# Patient Record
Sex: Male | Born: 1965 | Marital: Married | Smoking: Former smoker
Health system: Southern US, Community
[De-identification: ages and names within clinical notes are randomized; demographics above are authoritative.]

## PROBLEM LIST (undated history)

## (undated) DIAGNOSIS — I480 Paroxysmal atrial fibrillation: Secondary | ICD-10-CM

## (undated) DIAGNOSIS — G4733 Obstructive sleep apnea (adult) (pediatric): Secondary | ICD-10-CM

## (undated) DIAGNOSIS — R253 Fasciculation: Secondary | ICD-10-CM

## (undated) DIAGNOSIS — I10 Essential (primary) hypertension: Secondary | ICD-10-CM

## (undated) DIAGNOSIS — Z9989 Dependence on other enabling machines and devices: Secondary | ICD-10-CM

## (undated) DIAGNOSIS — R9439 Abnormal result of other cardiovascular function study: Secondary | ICD-10-CM

## (undated) DIAGNOSIS — H269 Unspecified cataract: Secondary | ICD-10-CM

## (undated) DIAGNOSIS — G8929 Other chronic pain: Secondary | ICD-10-CM

## (undated) DIAGNOSIS — Z9289 Personal history of other medical treatment: Secondary | ICD-10-CM

## (undated) DIAGNOSIS — K219 Gastro-esophageal reflux disease without esophagitis: Secondary | ICD-10-CM

## (undated) DIAGNOSIS — T7840XA Allergy, unspecified, initial encounter: Secondary | ICD-10-CM

## (undated) DIAGNOSIS — M109 Gout, unspecified: Secondary | ICD-10-CM

## (undated) DIAGNOSIS — M545 Low back pain, unspecified: Secondary | ICD-10-CM

## (undated) DIAGNOSIS — R001 Bradycardia, unspecified: Secondary | ICD-10-CM

## (undated) DIAGNOSIS — M1990 Unspecified osteoarthritis, unspecified site: Secondary | ICD-10-CM

## (undated) DIAGNOSIS — G43909 Migraine, unspecified, not intractable, without status migrainosus: Secondary | ICD-10-CM

## (undated) DIAGNOSIS — R200 Anesthesia of skin: Secondary | ICD-10-CM

## (undated) HISTORY — DX: Morbid (severe) obesity due to excess calories: E66.01

## (undated) HISTORY — DX: Unspecified osteoarthritis, unspecified site: M19.90

## (undated) HISTORY — DX: Paroxysmal atrial fibrillation: I48.0

## (undated) HISTORY — DX: Personal history of other medical treatment: Z92.89

## (undated) HISTORY — DX: Allergy, unspecified, initial encounter: T78.40XA

## (undated) HISTORY — DX: Unspecified cataract: H26.9

---

## 1998-02-14 ENCOUNTER — Encounter: Admission: RE | Admit: 1998-02-14 | Discharge: 1998-02-14 | Payer: Self-pay | Admitting: Family Medicine

## 1998-06-14 ENCOUNTER — Emergency Department (HOSPITAL_COMMUNITY): Admission: EM | Admit: 1998-06-14 | Discharge: 1998-06-14 | Payer: Self-pay | Admitting: Emergency Medicine

## 1998-09-19 ENCOUNTER — Encounter: Admission: RE | Admit: 1998-09-19 | Discharge: 1998-09-19 | Payer: Self-pay | Admitting: Sports Medicine

## 1999-07-11 ENCOUNTER — Encounter: Payer: Self-pay | Admitting: Emergency Medicine

## 1999-07-11 ENCOUNTER — Emergency Department (HOSPITAL_COMMUNITY): Admission: EM | Admit: 1999-07-11 | Discharge: 1999-07-11 | Payer: Self-pay | Admitting: Emergency Medicine

## 2000-08-25 ENCOUNTER — Emergency Department (HOSPITAL_COMMUNITY): Admission: EM | Admit: 2000-08-25 | Discharge: 2000-08-25 | Payer: Self-pay

## 2001-12-02 ENCOUNTER — Encounter: Admission: RE | Admit: 2001-12-02 | Discharge: 2001-12-02 | Payer: Self-pay | Admitting: Family Medicine

## 2002-03-12 ENCOUNTER — Encounter: Payer: Self-pay | Admitting: Cardiology

## 2002-03-12 ENCOUNTER — Encounter: Payer: Self-pay | Admitting: Emergency Medicine

## 2002-03-12 ENCOUNTER — Inpatient Hospital Stay (HOSPITAL_COMMUNITY): Admission: EM | Admit: 2002-03-12 | Discharge: 2002-03-15 | Payer: Self-pay | Admitting: Emergency Medicine

## 2002-03-23 ENCOUNTER — Encounter: Admission: RE | Admit: 2002-03-23 | Discharge: 2002-03-23 | Payer: Self-pay | Admitting: Internal Medicine

## 2002-04-12 ENCOUNTER — Emergency Department (HOSPITAL_COMMUNITY): Admission: EM | Admit: 2002-04-12 | Discharge: 2002-04-12 | Payer: Self-pay | Admitting: *Deleted

## 2002-04-21 ENCOUNTER — Ambulatory Visit (HOSPITAL_BASED_OUTPATIENT_CLINIC_OR_DEPARTMENT_OTHER): Admission: RE | Admit: 2002-04-21 | Discharge: 2002-04-21 | Payer: Self-pay | Admitting: Internal Medicine

## 2002-07-16 ENCOUNTER — Emergency Department (HOSPITAL_COMMUNITY): Admission: EM | Admit: 2002-07-16 | Discharge: 2002-07-16 | Payer: Self-pay | Admitting: Emergency Medicine

## 2003-03-23 ENCOUNTER — Emergency Department (HOSPITAL_COMMUNITY): Admission: EM | Admit: 2003-03-23 | Discharge: 2003-03-23 | Payer: Self-pay | Admitting: Emergency Medicine

## 2003-03-23 ENCOUNTER — Encounter: Payer: Self-pay | Admitting: Emergency Medicine

## 2004-03-20 ENCOUNTER — Emergency Department (HOSPITAL_COMMUNITY): Admission: EM | Admit: 2004-03-20 | Discharge: 2004-03-20 | Payer: Self-pay | Admitting: Emergency Medicine

## 2005-05-05 HISTORY — PX: CARDIAC CATHETERIZATION: SHX172

## 2005-05-17 ENCOUNTER — Observation Stay (HOSPITAL_COMMUNITY): Admission: EM | Admit: 2005-05-17 | Discharge: 2005-05-18 | Payer: Self-pay | Admitting: Emergency Medicine

## 2005-05-17 ENCOUNTER — Ambulatory Visit: Payer: Self-pay | Admitting: Sports Medicine

## 2005-05-17 ENCOUNTER — Ambulatory Visit: Payer: Self-pay | Admitting: Internal Medicine

## 2005-05-17 ENCOUNTER — Encounter: Payer: Self-pay | Admitting: Internal Medicine

## 2005-05-29 ENCOUNTER — Inpatient Hospital Stay (HOSPITAL_COMMUNITY): Admission: EM | Admit: 2005-05-29 | Discharge: 2005-05-31 | Payer: Self-pay | Admitting: Emergency Medicine

## 2005-05-29 ENCOUNTER — Ambulatory Visit: Payer: Self-pay | Admitting: Family Medicine

## 2005-06-03 ENCOUNTER — Ambulatory Visit: Payer: Self-pay | Admitting: Sports Medicine

## 2005-06-18 ENCOUNTER — Ambulatory Visit: Payer: Self-pay | Admitting: Family Medicine

## 2005-07-15 ENCOUNTER — Ambulatory Visit: Payer: Self-pay | Admitting: Family Medicine

## 2006-01-07 ENCOUNTER — Ambulatory Visit: Payer: Self-pay | Admitting: Family Medicine

## 2006-01-20 ENCOUNTER — Ambulatory Visit: Payer: Self-pay | Admitting: Family Medicine

## 2006-02-24 ENCOUNTER — Ambulatory Visit: Payer: Self-pay | Admitting: Family Medicine

## 2006-07-02 ENCOUNTER — Ambulatory Visit: Payer: Self-pay | Admitting: Family Medicine

## 2006-07-31 ENCOUNTER — Ambulatory Visit: Payer: Self-pay | Admitting: Family Medicine

## 2006-08-07 ENCOUNTER — Encounter: Admission: RE | Admit: 2006-08-07 | Discharge: 2006-08-07 | Payer: Self-pay | Admitting: Family Medicine

## 2006-10-02 DIAGNOSIS — G43909 Migraine, unspecified, not intractable, without status migrainosus: Secondary | ICD-10-CM | POA: Insufficient documentation

## 2006-10-02 DIAGNOSIS — G4730 Sleep apnea, unspecified: Secondary | ICD-10-CM | POA: Insufficient documentation

## 2006-10-02 DIAGNOSIS — K219 Gastro-esophageal reflux disease without esophagitis: Secondary | ICD-10-CM

## 2006-10-02 DIAGNOSIS — I10 Essential (primary) hypertension: Secondary | ICD-10-CM | POA: Insufficient documentation

## 2006-10-02 DIAGNOSIS — E785 Hyperlipidemia, unspecified: Secondary | ICD-10-CM | POA: Insufficient documentation

## 2006-10-02 HISTORY — DX: Hyperlipidemia, unspecified: E78.5

## 2006-10-02 HISTORY — DX: Essential (primary) hypertension: I10

## 2006-10-02 HISTORY — DX: Morbid (severe) obesity due to excess calories: E66.01

## 2006-10-02 HISTORY — DX: Migraine, unspecified, not intractable, without status migrainosus: G43.909

## 2006-10-02 HISTORY — DX: Gastro-esophageal reflux disease without esophagitis: K21.9

## 2007-05-13 ENCOUNTER — Telehealth: Payer: Self-pay | Admitting: *Deleted

## 2007-05-13 ENCOUNTER — Ambulatory Visit: Payer: Self-pay | Admitting: Family Medicine

## 2007-05-13 DIAGNOSIS — M109 Gout, unspecified: Secondary | ICD-10-CM

## 2007-05-13 HISTORY — DX: Gout, unspecified: M10.9

## 2007-07-20 ENCOUNTER — Telehealth: Payer: Self-pay | Admitting: Family Medicine

## 2007-08-31 ENCOUNTER — Telehealth: Payer: Self-pay | Admitting: *Deleted

## 2007-09-01 ENCOUNTER — Encounter: Payer: Self-pay | Admitting: Family Medicine

## 2007-09-07 ENCOUNTER — Encounter: Payer: Self-pay | Admitting: Family Medicine

## 2007-12-02 ENCOUNTER — Encounter: Admission: RE | Admit: 2007-12-02 | Discharge: 2007-12-02 | Payer: Self-pay | Admitting: Sports Medicine

## 2007-12-02 ENCOUNTER — Ambulatory Visit: Payer: Self-pay | Admitting: Family Medicine

## 2007-12-02 DIAGNOSIS — M545 Low back pain: Secondary | ICD-10-CM

## 2008-02-23 ENCOUNTER — Ambulatory Visit: Payer: Self-pay | Admitting: Family Medicine

## 2008-02-23 ENCOUNTER — Encounter: Admission: RE | Admit: 2008-02-23 | Discharge: 2008-02-23 | Payer: Self-pay | Admitting: Family Medicine

## 2008-02-23 ENCOUNTER — Ambulatory Visit (HOSPITAL_COMMUNITY): Admission: RE | Admit: 2008-02-23 | Discharge: 2008-02-23 | Payer: Self-pay | Admitting: Family Medicine

## 2008-02-23 ENCOUNTER — Encounter: Payer: Self-pay | Admitting: Family Medicine

## 2008-02-25 ENCOUNTER — Encounter: Payer: Self-pay | Admitting: Family Medicine

## 2008-02-28 ENCOUNTER — Emergency Department (HOSPITAL_COMMUNITY): Admission: EM | Admit: 2008-02-28 | Discharge: 2008-02-28 | Payer: Self-pay | Admitting: Emergency Medicine

## 2008-03-04 ENCOUNTER — Ambulatory Visit: Payer: Self-pay | Admitting: Family Medicine

## 2008-03-11 ENCOUNTER — Telehealth: Payer: Self-pay | Admitting: Family Medicine

## 2008-04-25 ENCOUNTER — Encounter: Payer: Self-pay | Admitting: *Deleted

## 2008-07-08 ENCOUNTER — Encounter: Payer: Self-pay | Admitting: Family Medicine

## 2008-09-06 ENCOUNTER — Telehealth: Payer: Self-pay | Admitting: Family Medicine

## 2008-09-06 ENCOUNTER — Ambulatory Visit (HOSPITAL_COMMUNITY): Admission: RE | Admit: 2008-09-06 | Discharge: 2008-09-06 | Payer: Self-pay | Admitting: Family Medicine

## 2008-09-06 ENCOUNTER — Ambulatory Visit: Payer: Self-pay | Admitting: Family Medicine

## 2008-09-09 ENCOUNTER — Encounter: Payer: Self-pay | Admitting: Family Medicine

## 2008-09-19 ENCOUNTER — Ambulatory Visit: Payer: Self-pay | Admitting: Family Medicine

## 2008-09-19 ENCOUNTER — Encounter: Payer: Self-pay | Admitting: Family Medicine

## 2008-09-19 DIAGNOSIS — G4700 Insomnia, unspecified: Secondary | ICD-10-CM | POA: Insufficient documentation

## 2008-09-19 HISTORY — DX: Insomnia, unspecified: G47.00

## 2008-09-19 LAB — CONVERTED CEMR LAB
AST: 32 units/L (ref 0–37)
Albumin: 4.5 g/dL (ref 3.5–5.2)
Alkaline Phosphatase: 74 units/L (ref 39–117)
BUN: 17 mg/dL (ref 6–23)
Chloride: 107 meq/L (ref 96–112)
Creatinine, Ser: 0.95 mg/dL (ref 0.40–1.50)
Glucose, Bld: 103 mg/dL — ABNORMAL HIGH (ref 70–99)
HDL: 33 mg/dL — ABNORMAL LOW (ref >39)
Total CHOL/HDL Ratio: 5.2
Total Protein: 7.4 g/dL (ref 6.0–8.3)
Triglycerides: 286 mg/dL — ABNORMAL HIGH (ref <150)

## 2008-09-20 ENCOUNTER — Encounter: Payer: Self-pay | Admitting: Family Medicine

## 2009-02-23 ENCOUNTER — Ambulatory Visit: Payer: Self-pay | Admitting: Family Medicine

## 2009-02-23 ENCOUNTER — Telehealth: Payer: Self-pay | Admitting: Family Medicine

## 2009-06-02 ENCOUNTER — Ambulatory Visit: Payer: Self-pay | Admitting: Family Medicine

## 2009-06-02 DIAGNOSIS — M654 Radial styloid tenosynovitis [de Quervain]: Secondary | ICD-10-CM

## 2009-06-12 ENCOUNTER — Ambulatory Visit: Payer: Self-pay | Admitting: Family Medicine

## 2009-09-18 ENCOUNTER — Telehealth: Payer: Self-pay | Admitting: Family Medicine

## 2010-04-04 ENCOUNTER — Ambulatory Visit: Payer: Self-pay | Admitting: Family Medicine

## 2010-05-07 ENCOUNTER — Ambulatory Visit: Payer: Self-pay | Admitting: Family Medicine

## 2010-05-07 ENCOUNTER — Encounter: Payer: Self-pay | Admitting: Family Medicine

## 2010-05-07 LAB — CONVERTED CEMR LAB
ALT: 45 units/L (ref 0–53)
Chloride: 105 meq/L (ref 96–112)
Glucose, Bld: 100 mg/dL — ABNORMAL HIGH (ref 70–99)
Potassium: 4.2 meq/L (ref 3.5–5.3)
Sodium: 141 meq/L (ref 135–145)
Total Protein: 7.1 g/dL (ref 6.0–8.3)
Triglycerides: 126 mg/dL (ref <150)
Uric Acid, Serum: 5.3 mg/dL (ref 4.0–7.8)

## 2010-05-08 ENCOUNTER — Encounter: Payer: Self-pay | Admitting: Family Medicine

## 2010-06-05 ENCOUNTER — Encounter: Payer: Self-pay | Admitting: Family Medicine

## 2010-06-14 ENCOUNTER — Telehealth: Payer: Self-pay | Admitting: Family Medicine

## 2010-08-24 ENCOUNTER — Ambulatory Visit: Admission: RE | Admit: 2010-08-24 | Discharge: 2010-08-24 | Payer: Self-pay | Source: Home / Self Care

## 2010-08-24 ENCOUNTER — Encounter: Payer: Self-pay | Admitting: Family Medicine

## 2010-08-24 NOTE — Progress Notes (Deleted)
Subjective:     Patient ID: [name] is a 45 y.o. male.  HPIPatient did not pick up the refill for his allopurinol for 2 weeks and today noticed his right great toe beginning to develop pain of gout.  He was not able to wear his shoes today.    Right shoulder is still causing problems but only when he lifts heavy weights.  Back Pain has been under good control with weight loss and exercise.    Review of Systems  Musculoskeletal: Positive for back pain and joint swelling.       Objective:   Physical Exam Subjective:     [name] is a 45 y.o. male who presents with possible gout. Pain is located in {pain location list:14040}, and has been present for {1-10:13787} {time; units:19136}. Pain is described as {pain character:10965}, and is {pain assessment:11180}. Associated symptoms: {assoc symptoms:11272}. The patient has {pain relief:19418}. Related to injury: {yes***/no:311194}.  {Common ambulatory SmartLinks:19316}  Review of Systems {ros; complete:30496}    Objective:    {exam; complete:17964}     Assessment:    {arthritis dx list:14046::"Gouty flare up"}     Plan:    {Plan; [phone]}     Assessment:     ***    Plan:     ***

## 2010-09-04 NOTE — Letter (Signed)
Summary: Lipid Letter  [facility]  [address]   [place], [place] [address]   Phone: [phone]  Fax: [phone]    05/08/2010  [name] [address] [place], [place]  [address]  Dear [name]:  We have carefully reviewed your last lipid profile from 05/07/2010 and the results are noted below with a summary of recommendations for lipid management.    Cholesterol:       171     Goal: less than 200   HDL "good" Cholesterol:   28     Goal: greater than 50   LDL "bad" Cholesterol:   118     Goal: less than 130   Triglycerides:       126     Goal: less than 120    As you see you have a low HDL, HDL is the good cholesterol.  This is probably genetic but if you exercise daily you can increase this and keep your heart healthy.    TLC Diet (Therapeutic Lifestyle Change): Saturated Fats (animal fats) & Transfatty acids (found in comercial baking products)should be kept < 7% of total calories ***Reduce Saturated Fats, just do not eat them.  Total Fat should be no greater than 25-35% of total calories Carbohydrates should be 30-40% of total calories, because of risk of diabetes(these are sugars, breads, chips, pasta) Protein should be approximately 15% of total calories Increased fiber may help lower LDL      Adjunctive Measures (may lower LIPIDS and reduce risk of Heart Attack) include: Aerobic Exercise (20-30 minutes 3-4 times a week)-is the most important activity for you.  You HDL is very low and that can only be increased wtih regular exercise. Limit Alcohol Consumption Weight Reduction-is essential Aspirin 81 mg a day by mouth (if not allergic or contraindicated)- Dietary Fiber 20-30 grams a day by mouth     Current Medications: 1)    Allopurinol 300 Mg Tabs (Allopurinol) .Marland Kitchen.. 1 tablet by mouth once a day 2)    Omeprazole 20 Mg Cpdr (Omeprazole) .... Take 1 capsule by mouth twice a day 3)    Toprol Xl 50 Mg Xr24h-tab (Metoprolol succinate) .... One  daily 4)    Fish Oil 1000 Mg Caps (Omega-3 fatty acids) 5)    Aspirin 81 Mg Chew (Aspirin) .Marland Kitchen.. 1 by mouth once daily 6)    Sumatriptan Succinate 50 Mg Tabs (Sumatriptan succinate) .... Sig: take 1 tablet with onset ha; you may repeat in 2 hours if the headache persists. disp quant sufficient 1 month  If you have any questions, please call. We appreciate being able to work with you.   Sincerely,    [facility] Luretha Murphy NP  Appended Document: Lipid Letter mailed

## 2010-09-04 NOTE — Assessment & Plan Note (Signed)
Summary: [name]   Vital Signs:  Patient profile:   45 year old male Height:      74 inches Weight:      318 pounds BMI:     40.98 Pulse rate:   60 / minute BP sitting:   147 / 90  (left arm) Cuff size:   large  Vitals Entered By: Renato Battles slade,cma CC: physical. flu shot. Is Patient Diabetic? No Pain Assessment Patient in pain? no        Primary Care Provider:  Luretha Murphy NP  CC:  physical. flu shot..  History of Present Illness: Annual CPE, having aches and pain but overall nothing acute.  No bouts of gout since on allopurinol.  Episode of migrane HA resolved, see by D. Breen.    He and wife are trying to loose weight by doing Weight Watchers, he tries to go to the gym 3 times per week.  Back hurts almost all the time, gets worse with rain.  Chest wall pain one week ago that has resolved, it was contiuous and felt as it he pulled a muscle.  BP has been running low and he has been feeling lightheaded, has not consistently be taking his Toprol.  Habits & Providers  Alcohol-Tobacco-Diet     Tobacco Status: quit > 6 months     Tobacco Counseling: to remain off tobacco products  Current Medications (verified): 1)  Allopurinol 300 Mg Tabs (Allopurinol) .Marland Kitchen.. 1 Tablet By Mouth Once A Day 2)  Omeprazole 20 Mg Cpdr (Omeprazole) .... Take 1 Capsule By Mouth Twice A Day 3)  Toprol Xl 50 Mg Xr24h-Tab (Metoprolol Succinate) .... One Daily 4)  Fish Oil 1000 Mg Caps (Omega-3 Fatty Acids) 5)  Aspirin 81 Mg Chew (Aspirin) .Marland Kitchen.. 1 By Mouth Once Daily 6)  Sumatriptan Succinate 50 Mg Tabs (Sumatriptan Succinate) .... Sig: Take 1 Tablet With Onset Ha; You May Repeat in 2 Hours If The Headache Persists. Disp Quant Sufficient 1 Month  Allergies: 1)  ! Penicillin V Potassium (Penicillin V Potassium)  Past History:  Past Medical History: Last updated: 09/19/2008 admitted for cp 10/06, rulled out,  TSH nl BIPAP for sleep apnea  Past Surgical History: Last updated: 05/13/2007 ABI >1  - 07/01/2005,  Cardiac Cath-no CAD - 06/18/2005,  CPAP - 07/31/2006, Ddimer- neg - 06/18/2005,  Lipid panel: TC=160, TG=310, HDL=32, LDL=66 - 08/01/2006,  Multiple skin tag removal - 07/31/2006, Sleep Study 2007-sleep apnea - 07/03/2006,  Sleep study-2003 - 06/18/2005,  venous dopplers-neg for DVT - 06/18/2005,  Xray L foot minimal DJD at [facility] - 08/14/2006  Family History: Last updated: 02/23/2008 DM HTN Mom? "heart problems" at 30 or 40 but has not had MI that he is aware of. Father and brother no heart problems, no stroke.  Social History: Last updated: 09/06/2008 [place] [place], works on Information systems manager heavy items.   Review of Systems      See HPI  Physical Exam  General:  Well appearing overweight male. Ears:  R ear normal and L ear normal.   Nose:  External nasal examination shows no deformity or inflammation. Nasal mucosa are pink and moist without lesions or exudates. Mouth:  poor dentition.  small phaynx Neck:  short neck, no masses Chest Wall:  non tender Lungs:  normal respiratory effort and normal breath sounds.   Heart:  normal rate, regular rhythm, and no murmur.   Abdomen:  obese, firm, non tender Msk:  descreased ROM of lower back, flat lumbar lordosis  Skin:  Intact without suspicious lesions or rashes Psych:  Oriented X3 and good eye contact.     Impression & Recommendations:  Problem # 1:  HEALTH MAINTENANCE EXAM (ICD-V70.0) Reinforced healthy diet and regular exercise as the only road to staying healthy Orders: [facility] - Est  40-64 yrs (36644)  Problem # 2:  BACK PAIN, LUMBAR (ICD-724.2) Recommended doing low back exercises daily, has not requied on going pain meds, just during flairs. His updated medication list for this problem includes:    Aspirin 81 Mg Chew (Aspirin) .Marland Kitchen... 1 by mouth once daily  Problem # 3:  GOUT (ICD-274.9) no recent attacks, check UA today His updated medication list for this problem includes:    Allopurinol  300 Mg Tabs (Allopurinol) .Marland Kitchen... 1 tablet by mouth once a day  Orders: Uric Acid-[facility] ([id number])  Problem # 4:  OBESITY, NOS (ICD-278.00) has lost a few pounds recenttly, pattern has been to loose and gain, eats too much food for the most part.  Problem # 5:  HYPERTENSION, BENIGN SYSTEMIC (ICD-401.1) reduce dosage of beta blocker His updated medication list for this problem includes:    Toprol Xl 50 Mg Xr24h-tab (Metoprolol succinate) ..... One daily  Orders: Lipid-[facility] ([id number])  Complete Medication List: 1)  Allopurinol 300 Mg Tabs (Allopurinol) .Marland Kitchen.. 1 tablet by mouth once a day 2)  Omeprazole 20 Mg Cpdr (Omeprazole) .... Take 1 capsule by mouth twice a day 3)  Toprol Xl 50 Mg Xr24h-tab (Metoprolol succinate) .... One daily 4)  Fish Oil 1000 Mg Caps (Omega-3 fatty acids) 5)  Aspirin 81 Mg Chew (Aspirin) .Marland Kitchen.. 1 by mouth once daily 6)  Sumatriptan Succinate 50 Mg Tabs (Sumatriptan succinate) .... Sig: take 1 tablet with onset ha; you may repeat in 2 hours if the headache persists. disp quant sufficient 1 month  Other Orders: Influenza Vaccine NON MCR (51884) Comp Met-[facility] ([id number])  Patient Instructions: 1)  Please schedule a follow-up appointment in 3 months .  2)  Cut Toprol in 1/2 until gone and then new dose 3)  Keep up exercising and stretching Prescriptions: TOPROL XL 50 MG XR24H-TAB (METOPROLOL SUCCINATE) one daily Brand medically necessary #30 x 6   Entered and Authorized by:   Luretha Murphy NP   Signed by:   Luretha Murphy NP on 05/07/2010   Method used:   Print then Give to Patient   RxID:   [id number]    Influenza Vaccine    Vaccine Type: Fluvax Non-MCR    Site: right deltoid    Mfr: [email or web address]    Dose: 0.5 ml    Route: IM    Given by: Arlyss Repress CMA,    Exp. Date: 01/30/2011    Lot #: [id number]    VIS given: 02/27/10 version given May 07, 2010.  Flu Vaccine Consent Questions    Do you have a history of severe allergic reactions to  this vaccine? no    Any prior history of allergic reactions to egg and/or gelatin? no    Do you have a sensitivity to the preservative Thimersol? no    Do you have a past history of Guillan-Barre Syndrome? no    Do you currently have an acute febrile illness? no    Have you ever had a severe reaction to latex? no    Vaccine information given and explained to patient? yes

## 2010-09-04 NOTE — Progress Notes (Signed)
Summary: phn msg  Phone Note Call from Patient Call back at [phone]   Caller: Patient Summary of Call: strained a muscle in his chest and wants to know if he can take a muscle relaxer that he already has Initial call taken by: De Nurse,  June 14, 2010 10:03 AM  Follow-up for Phone Call        Pain in on the left side of his chest but he's positive that it is a pulled muscle.  He's lifting weights per Susan's recommendations and says he pulled it then.  No other signs of AMI, denies SOB, diaphoresis, radiating pains, etc.  It has happened before in the same location and is localized to the muscle.  He has an old script for Flexeril but it has expired.  Told him to discard those and I would ask Darl Pikes if she would be willing to refill.  Explained that she probably will want to see him first.  He understood.  Told him I would call him back later. Follow-up by: Dennison Nancy RN,  June 14, 2010 10:24 AM  Additional Follow-up for Phone Call Additional follow up Details #1::        Let message on voice mail that I refilled flexeril and sent to [facility] on [address].  Recommend home care of massage, heat, ice, and muscle relaxant.  He may come in if he wants to be seen. Additional Follow-up by: Luretha Murphy NP,  June 14, 2010 12:09 PM    New/Updated Medications: FLEXERIL 10 MG TABS (CYCLOBENZAPRINE HCL) one three times a day as needed Prescriptions: FLEXERIL 10 MG TABS (CYCLOBENZAPRINE HCL) one three times a day as needed  #30 x 1   Entered and Authorized by:   Luretha Murphy NP   Signed by:   Luretha Murphy NP on 06/14/2010   Method used:   Electronically to        [facility] (retail)       [address]       [place], [place]  [address]       Ph: [phone]       Fax: [phone]   RxID:   [id number]   Appended Document: phn msg Patient feels that he needs something stonger for pain, will call in Hydrocodone/APAP 5/500, #20 no refills. This  was called in and not handwritten. Luretha Murphy NP  June 14, 2010 12:26 PM    Clinical Lists Changes  Medications: Added new medication of HYDROCODONE-ACETAMINOPHEN 5-500 MG TABS (HYDROCODONE-ACETAMINOPHEN) one q 6 hours as needed pain - Signed Rx of HYDROCODONE-ACETAMINOPHEN 5-500 MG TABS (HYDROCODONE-ACETAMINOPHEN) one q 6 hours as needed pain;  #20 x 0;  Signed;  Entered by: Luretha Murphy NP;  Authorized by: Luretha Murphy NP;  Method used: Handwritten    Prescriptions: HYDROCODONE-ACETAMINOPHEN 5-500 MG TABS (HYDROCODONE-ACETAMINOPHEN) one q 6 hours as needed pain  #20 x 0   Entered and Authorized by:   Luretha Murphy NP   Signed by:   Luretha Murphy NP on 06/14/2010   Method used:   Handwritten   RxID:   [id number]

## 2010-09-04 NOTE — Assessment & Plan Note (Signed)
Summary: migraines/saxon/[facility]c   Vital Signs:  Patient profile:   45 year old male Weight:      329.6 pounds BMI:     42.47 Temp:     97.9 degrees F Pulse rate:   61 / minute BP sitting:   130 / 79  (left arm)  Vitals Entered By: Starleen Blue RN (April 04, 2010 2:06 PM) CC: migraine Is Patient Diabetic? No Pain Assessment Patient in pain? yes     Location: head Intensity: 6   Primary Care Provider:  Luretha Murphy NP  CC:  migraine.  History of Present Illness: Patient presents today for an acute visit. He is seen by me, along with medical student [facility].  [name] comes in today with complaint of headache waxing and waning over the past 3 weeks.  Has taken a total of three bottles of Excedrin, with only mild temporary relief.  Has had intermittent N/V when the pain is worse.  Pain is described as pounding, and is localized around/behind both eyes and forehead.  Occasionally radiates to just above pinnae.  Denies fevers or chills.  Perhaps mild photophobia.  He describes this HA pain as similar in character to the pain with his usual migraine headache pain that he has experienced since childhood.   Today he feels a bit better than he had.  Rates the pain a 6/10 today, compared with an 8-9 yesterday.  Says when really bad, the pain "brings tears to my eyes".  No jaw claudication, no rhinorrhea or unilateral lacrimation.  Social Hx; Quit tobacco.  Does not drink any alcohol.  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  Recently out of work for foot injury, during whcih time no headaches.  Went back to work at about the time of onset of this episode of headache.   Habits & Providers  Alcohol-Tobacco-Diet     Tobacco Status: quit > 6 months  Allergies (verified): 1)  ! Penicillin V Potassium (Penicillin V Potassium)  Social History: Smoking Status:  quit > 6 months  Physical Exam  General:   Generally well appearing, Jovial, in good spirits.  No apparent distress.  Eyes:  Non-dilated exam without evident papilledema Ears:  TMs intact and with good cone of light.  Nose:  No maxiillary or frontal sinus tenderness.  No purulent nasal discharge.  Mouth:  Moist mucus membranes, clear oropharynx. Neck:  Neck supple without cervical adenopathy. Lungs:  Normal respiratory effort, chest expands symmetrically. Lungs are clear to auscultation, no crackles or wheezes. Heart:  Normal rate and regular rhythm. S1 and S2 normal without gallop, murmur, click, rub or other extra sounds.   Impression & Recommendations:  Problem # 1:  MIGRAINE, UNSPEC., W/O INTRACTABLE MIGRAINE (ICD-346.90) Headache pain that is consistent with his prior migraines.  Had previously been on what sounds like a triptan (before 2008 EMR implementation) and this helped him but was cost-prohibitive.  He does not have diagnosis of heart disease; risk factors include HTN, obesity and hypertriglyceridemia.  Will opt to treat his acute HA complaint with Toradol 60mg  IM today.  May consider low-dose triptan if not resolved, and for future abortive therapy.    Will encourage him to speak with his primary about preventive/prophylaxis for migraines, as his come on two to three times a month and are pretty debilitating.  He may benefit from this, but should be addressed after the acute HA resolves.  The following medications were removed from the medication list:    Vicodin 5-500 Mg Tabs (Hydrocodone-acetaminophen) ..... One to two every 6 hours as needed pain.    Ibuprofen 200 Mg Tabs (Ibuprofen) .Marland Kitchen... 3-4 three times a day prn His updated medication list for this problem includes:    Toprol Xl 100 Mg Tb24 (Metoprolol succinate) .Marland Kitchen... Take 1 tablet by mouth once a day    Aspirin 81 Mg Chew (Aspirin) .Marland Kitchen... 1 by mouth once daily    Sumatriptan Succinate 50 Mg Tabs (Sumatriptan succinate) ..... Sig: take 1 tablet with onset ha; you may  repeat in 2 hours if the headache persists. disp quant sufficient 1 month  Orders: Ketorolac-Toradol 15mg  (Z6109) [facility]- Est Level  3 (60454)  Complete Medication List: 1)  Allopurinol 300 Mg Tabs (Allopurinol) .Marland Kitchen.. 1 tablet by mouth once a day 2)  Omeprazole 20 Mg Cpdr (Omeprazole) .... Take 1 capsule by mouth twice a day 3)  Toprol Xl 100 Mg Tb24 (Metoprolol succinate) .... Take 1 tablet by mouth once a day 4)  Fish Oil 1000 Mg Caps (Omega-3 fatty acids) 5)  Aspirin 81 Mg Chew (Aspirin) .Marland Kitchen.. 1 by mouth once daily 6)  Sumatriptan Succinate 50 Mg Tabs (Sumatriptan succinate) .... Sig: take 1 tablet with onset ha; you may repeat in 2 hours if the headache persists. disp quant sufficient 1 month  Patient Instructions: 1)  It was a pleasure to see you today.  We treated you for a persistent migraine headache.  2)  We are giving you an injection of Toradol 60mg  today. This should break your headache today.  3)  We recommend you talk with your primary care provider about using a daily/nightly medicine to prevent frequent headache flares.  4)  You are getting a prescription for sumatriptan (Imitrex) 50mg  tablets.  Please take one when the next migraine headache begins.  You may take another dose 2 hours later if the headache does not go away.  5)  Please make a follow-up appointment with your primary in the coming 1 month to discuss long-term migraine management.  Prescriptions: SUMATRIPTAN SUCCINATE 50 MG TABS (SUMATRIPTAN SUCCINATE) SIG: Take 1 tablet with onset HA; you may repeat in 2 hours if the headache persists. DISP Quant sufficient 1 month  #8 x 1   Entered and Authorized by:   Paula Compton MD   Signed by:   Paula Compton MD on 04/04/2010   Method used:   Electronically to        [facility] (retail)       [address]       [place], [place]  [address]       Ph: [phone]       Fax: [phone]   RxID:   [id number]    Medication  Administration  Injection # 1:    Medication: Ketorolac-Toradol 15mg     Diagnosis: MIGRAINE, UNSPEC., W/O INTRACTABLE MIGRAINE (ICD-346.90)    Route: IM    Site: LUOQ gluteus    Exp Date: 11/04/2011    Lot #: [id number]    Mfr: [email or web address]    Comments: 60mg  given    Patient tolerated injection without complications    Given by: Jone Baseman CMA (April 04, 2010 4:11 PM)  Orders Added: 1)  Ketorolac-Toradol 15mg  [J1885] 2)  [facility]- Est Level  3 [[address]403]

## 2010-09-04 NOTE — Progress Notes (Signed)
Summary: phn msg  Phone Note Call from Patient Call back at [phone]   Caller: Patient Summary of Call: Pt requesting something for his nerves.  His dad is getting ready to be taken off life support.  Can Luretha Murphy call him. Initial call taken by: Clydell Hakim,  September 18, 2009 4:07 PM    New/Updated Medications: LORAZEPAM 0.5 MG TABS (LORAZEPAM) one tab two times a day as needed for nervew Prescriptions: LORAZEPAM 0.5 MG TABS (LORAZEPAM) one tab two times a day as needed for nervew  #60 x 0   Entered and Authorized by:   Luretha Murphy NP   Signed by:   Luretha Murphy NP on 09/18/2009   Method used:   Printed then faxed to ...         RxID:   [id number]

## 2010-09-04 NOTE — Miscellaneous (Signed)
  Clinical Lists Changes  Problems: Removed problem of HEALTH MAINTENANCE EXAM (ICD-V70.0) Removed problem of SCREENING OTHER&UNSPEC CARDIOVASCULAR CONDITIONS (ICD-V81.2) Removed problem of SHOULDER PAIN, LEFT (ICD-719.41) Removed problem of ACHILLES TENDINITIS (ICD-726.71) Removed problem of CHEST PAIN (ICD-786.50) Removed problem of HAY FEVER (ICD-477.0)

## 2010-09-05 ENCOUNTER — Encounter: Payer: Self-pay | Admitting: Family Medicine

## 2010-09-05 ENCOUNTER — Inpatient Hospital Stay (HOSPITAL_COMMUNITY): Payer: 59

## 2010-09-05 ENCOUNTER — Ambulatory Visit (INDEPENDENT_AMBULATORY_CARE_PROVIDER_SITE_OTHER): Payer: 59 | Admitting: Family Medicine

## 2010-09-05 ENCOUNTER — Observation Stay (HOSPITAL_COMMUNITY)
Admission: AD | Admit: 2010-09-05 | Discharge: 2010-09-06 | Disposition: A | Discharge status: Home / Self Care | Payer: 59 | Source: Ambulatory Visit | Attending: Family Medicine | Admitting: Family Medicine

## 2010-09-05 DIAGNOSIS — K219 Gastro-esophageal reflux disease without esophagitis: Secondary | ICD-10-CM

## 2010-09-05 DIAGNOSIS — I498 Other specified cardiac arrhythmias: Secondary | ICD-10-CM | POA: Insufficient documentation

## 2010-09-05 DIAGNOSIS — G4733 Obstructive sleep apnea (adult) (pediatric): Secondary | ICD-10-CM | POA: Insufficient documentation

## 2010-09-05 DIAGNOSIS — I10 Essential (primary) hypertension: Secondary | ICD-10-CM

## 2010-09-05 DIAGNOSIS — M109 Gout, unspecified: Secondary | ICD-10-CM

## 2010-09-05 DIAGNOSIS — E785 Hyperlipidemia, unspecified: Secondary | ICD-10-CM | POA: Insufficient documentation

## 2010-09-05 DIAGNOSIS — R079 Chest pain, unspecified: Secondary | ICD-10-CM | POA: Insufficient documentation

## 2010-09-05 DIAGNOSIS — R0789 Other chest pain: Principal | ICD-10-CM | POA: Insufficient documentation

## 2010-09-05 LAB — CBC
HCT: 41.2 % (ref 39.0–52.0)
RBC: 4.89 MIL/uL (ref 4.22–5.81)
RDW: 12.9 % (ref 11.5–15.5)
WBC: 7.7 10*3/uL (ref 4.0–10.5)

## 2010-09-05 LAB — URIC ACID: Uric Acid, Serum: 7.5 mg/dL (ref 4.0–7.8)

## 2010-09-05 LAB — HEMOGLOBIN A1C: Mean Plasma Glucose: 117 mg/dL — ABNORMAL HIGH (ref <117)

## 2010-09-05 LAB — CARDIAC PANEL(CRET KIN+CKTOT+MB+TROPI)
Relative Index: 1 (ref 0.0–2.5)
Total CK: 327 U/L — ABNORMAL HIGH (ref 7–232)
Troponin I: 0.01 ng/mL (ref 0.00–0.06)

## 2010-09-05 LAB — BASIC METABOLIC PANEL
Calcium: 9.4 mg/dL (ref 8.4–10.5)
GFR calc Af Amer: >60 mL/min (ref >60)
Glucose, Bld: 96 mg/dL (ref 70–99)
Potassium: 3.8 mEq/L (ref 3.5–5.1)
Sodium: 137 mEq/L (ref 135–145)

## 2010-09-05 LAB — TSH: TSH: 0.684 u[IU]/mL (ref 0.350–4.500)

## 2010-09-06 ENCOUNTER — Encounter: Payer: Self-pay | Admitting: *Deleted

## 2010-09-06 LAB — D-DIMER, QUANTITATIVE: D-Dimer, Quant: <0.22 ug/mL-FEU (ref 0.00–0.48)

## 2010-09-06 LAB — LIPID PANEL
HDL: 24 mg/dL — ABNORMAL LOW (ref >39)
Total CHOL/HDL Ratio: 7.2 RATIO

## 2010-09-06 LAB — CARDIAC PANEL(CRET KIN+CKTOT+MB+TROPI)
CK, MB: 2.9 ng/mL (ref 0.3–4.0)
Relative Index: 1 (ref 0.0–2.5)
Troponin I: 0.02 ng/mL (ref 0.00–0.06)

## 2010-09-06 NOTE — Assessment & Plan Note (Signed)
Summary: gout,df   Vital Signs:  Patient profile:   45 year old male Height:      74 inches Weight:      301.9 pounds BMI:     38.90 Pulse rate:   72 / minute BP sitting:   120 / 86  (left arm) Cuff size:   large  Vitals Entered By: Arlyss Repress CMA, (August 24, 2010 3:19 PM) CC: 1.) gout left big toe 2.) f/up HTN and refill meds 3.) pain left shoulder off and on Is Patient Diabetic? No Pain Assessment Patient in pain? yes     Location: left big toe Intensity: 4   Primary Care Provider:  Luretha Murphy NP  CC:  1.) gout left big toe 2.) f/up HTN and refill meds 3.) pain left shoulder off and on.  History of Present Illness: Did not pick up allopurinol refill and has been off for almost 2 weeks, feels gout in his right big toe developing, was unable to wear shoes today.  Chronic back pain comes and goes, has a very physically demanding job, Naval architect at [employer].  Continues to go to the gym, lift weights.  Right shoulder acts up when he increased weight during lifting.  Habits & Providers  Alcohol-Tobacco-Diet     Tobacco Status: quit > 6 months  Current Medications (verified): 1)  Allopurinol 300 Mg Tabs (Allopurinol) .Marland Kitchen.. 1 Tablet By Mouth Once A Day 2)  Omeprazole 20 Mg Cpdr (Omeprazole) .... Take 1 Capsule By Mouth Twice A Day 3)  Toprol Xl 50 Mg Xr24h-Tab (Metoprolol Succinate) .... One Daily 4)  Fish Oil 1000 Mg Caps (Omega-3 Fatty Acids) 5)  Aspirin 81 Mg Chew (Aspirin) .Marland Kitchen.. 1 By Mouth Once Daily 6)  Sumatriptan Succinate 50 Mg Tabs (Sumatriptan Succinate) .... Sig: Take 1 Tablet With Onset Ha; You May Repeat in 2 Hours If The Headache Persists. Disp Quant Sufficient 1 Month 7)  Flexeril 10 Mg Tabs (Cyclobenzaprine Hcl) .... One Three Times A Day As Needed 8)  Hydrocodone-Acetaminophen 5-500 Mg Tabs (Hydrocodone-Acetaminophen) .... One Q 6 Hours As Needed Pain 9)  Colcrys 0.6 Mg Tabs (Colchicine) .... One Pill Every Hour For 4 Hours Today Then One Two Times A Day  For One Month  Allergies (verified): 1)  ! Penicillin V Potassium (Penicillin V Potassium)  Review of Systems      See HPI  Physical Exam  General:  Alert, in no acute distress, wearing bedroom slippers Msk:  right shoulder with full ROM and 5/5 strength, crepitus present  Right first MT joint with warmth, reddness and touchy   Impression & Recommendations:  Problem # 1:  GOUT (ICD-274.9)  likely flair related to abrupt discontinuation of allopurinol, will treated with colchicine and bridge for 4-8 weeks.  Patient instructed His updated medication list for this problem includes:    Allopurinol 300 Mg Tabs (Allopurinol) .Marland Kitchen... 1 tablet by mouth once a day    Colcrys 0.6 Mg Tabs (Colchicine) ..... One pill every hour for 4 hours today then one two times a day for one month  Orders: [facility]- Est Level  3 (44010)  Problem # 2:  BACK PAIN, LUMBAR (ICD-724.2)  comes and goes, refilled hydrocodone, uses during flairs, no signs of abuse  His updated medication list for this problem includes:    Aspirin 81 Mg Chew (Aspirin) .Marland Kitchen... 1 by mouth once daily    Flexeril 10 Mg Tabs (Cyclobenzaprine hcl) ..... One three times a day as needed  Hydrocodone-acetaminophen 5-500 Mg Tabs (Hydrocodone-acetaminophen) ..... One q 6 hours as needed pain  Orders: [facility]- Est Level  3 (98119)  Complete Medication List: 1)  Allopurinol 300 Mg Tabs (Allopurinol) .Marland Kitchen.. 1 tablet by mouth once a day 2)  Omeprazole 20 Mg Cpdr (Omeprazole) .... Take 1 capsule by mouth twice a day 3)  Toprol Xl 50 Mg Xr24h-tab (Metoprolol succinate) .... One daily 4)  Fish Oil 1000 Mg Caps (Omega-3 fatty acids) 5)  Aspirin 81 Mg Chew (Aspirin) .Marland Kitchen.. 1 by mouth once daily 6)  Sumatriptan Succinate 50 Mg Tabs (Sumatriptan succinate) .... Sig: take 1 tablet with onset ha; you may repeat in 2 hours if the headache persists. disp quant sufficient 1 month 7)  Flexeril 10 Mg Tabs (Cyclobenzaprine hcl) .... One three times a day as  needed 8)  Hydrocodone-acetaminophen 5-500 Mg Tabs (Hydrocodone-acetaminophen) .... One q 6 hours as needed pain 9)  Colcrys 0.6 Mg Tabs (Colchicine) .... One pill every hour for 4 hours today then one two times a day for one month  Patient Instructions: 1)  Do not start allopurnol for one week 2)  Colcrst every hour for 4 hours then two times a day for one month Prescriptions: TOPROL XL 50 MG XR24H-TAB (METOPROLOL SUCCINATE) one daily Brand medically necessary #31 x 6   Entered and Authorized by:   Luretha Murphy NP   Signed by:   Luretha Murphy NP on 08/24/2010   Method used:   Print then Give to Patient   RxID:   [id number] COLCRYS 0.6 MG TABS (COLCHICINE) one pill every hour for 4 hours today then one two times a day for one month Brand medically necessary #60 x 1   Entered and Authorized by:   Luretha Murphy NP   Signed by:   Luretha Murphy NP on 08/24/2010   Method used:   Print then Give to Patient   RxID:   [id number] HYDROCODONE-ACETAMINOPHEN 5-500 MG TABS (HYDROCODONE-ACETAMINOPHEN) one q 6 hours as needed pain Brand medically necessary #30 x 0   Entered and Authorized by:   Luretha Murphy NP   Signed by:   Luretha Murphy NP on 08/24/2010   Method used:   Print then Give to Patient   RxID:   [id number]    Orders Added: 1)  [facility]- Est Level  3 [42595]

## 2010-09-11 ENCOUNTER — Inpatient Hospital Stay (INDEPENDENT_AMBULATORY_CARE_PROVIDER_SITE_OTHER): Payer: 59 | Admitting: Family Medicine

## 2010-09-11 ENCOUNTER — Encounter: Payer: Self-pay | Admitting: Family Medicine

## 2010-09-11 DIAGNOSIS — M654 Radial styloid tenosynovitis [de Quervain]: Secondary | ICD-10-CM

## 2010-09-11 DIAGNOSIS — E781 Pure hyperglyceridemia: Secondary | ICD-10-CM

## 2010-09-11 DIAGNOSIS — R079 Chest pain, unspecified: Secondary | ICD-10-CM

## 2010-09-11 DIAGNOSIS — M109 Gout, unspecified: Secondary | ICD-10-CM

## 2010-09-12 ENCOUNTER — Telehealth: Payer: Self-pay | Admitting: Family Medicine

## 2010-09-12 ENCOUNTER — Encounter: Payer: Self-pay | Admitting: Family Medicine

## 2010-09-12 DIAGNOSIS — R079 Chest pain, unspecified: Secondary | ICD-10-CM | POA: Insufficient documentation

## 2010-09-12 NOTE — Telephone Encounter (Signed)
Needs note . See call from front office

## 2010-09-12 NOTE — Initial Assessments (Signed)
Summary: chest pain,df   Vital Signs:  Patient profile:   45 year old male Height:      74 inches O2 Sat:      97 % on Room air Temp:     97.8 degrees F oral Pulse rate:   59 / minute BP sitting:   143 / 91  (left arm) Cuff size:   large  Vitals Entered By: Garen Grams LPN (September 05, 2010 10:20 AM)  O2 Flow:  Room air CC: chest pain Pain Assessment Patient in pain? yes     Location: chest   Primary Care Provider:  Luretha Murphy NP  CC:  chest pain.  History of Present Illness: 1. Chest pain:  Pt presents to clinic with chest pain.  It has been there since 8am.  It is constant.  It is substernal with some radiation to the left shoulder.  Described as a stabbing pain.  Denies feelings of pressure or weight on his chest.  He did go to work this morning and it made the pain more worse.  Not improved with rest.  Worse when he lays back or when he moves his arm certain ways.  He has a hx of chest pain.  Similar episode happened last year and he went to UC and diagnosed with a muscle strain.  This does feel similar to that.  PMHx: work up for chest pain in 2006 which included a normal cath  ROS: denies diaphoresis, shortness of breath, abdmoninal pain  Current Medications (verified): 1)  Allopurinol 300 Mg Tabs (Allopurinol) .Marland Kitchen.. 1 Tablet By Mouth Once A Day 2)  Omeprazole 20 Mg Cpdr (Omeprazole) .... Take 1 Capsule By Mouth Twice A Day 3)  Toprol Xl 50 Mg Xr24h-Tab (Metoprolol Succinate) .... One Daily 4)  Fish Oil 1000 Mg Caps (Omega-3 Fatty Acids) 5)  Aspirin 81 Mg Chew (Aspirin) .Marland Kitchen.. 1 By Mouth Once Daily 6)  Sumatriptan Succinate 50 Mg Tabs (Sumatriptan Succinate) .... Sig: Take 1 Tablet With Onset Ha; You May Repeat in 2 Hours If The Headache Persists. Disp Quant Sufficient 1 Month 7)  Flexeril 10 Mg Tabs (Cyclobenzaprine Hcl) .... One Three Times A Day As Needed 8)  Hydrocodone-Acetaminophen 5-500 Mg Tabs (Hydrocodone-Acetaminophen) .... One Q 6 Hours As Needed Pain 9)   Colcrys 0.6 Mg Tabs (Colchicine) .... One Pill Every Hour For 4 Hours Today Then One Two Times A Day For One Month  Allergies: 1)  ! Penicillin V Potassium (Penicillin V Potassium)  Past History:  Past Medical History: admitted for cp 10/06, ruled out,  TSH nl BIPAP for sleep apnea  Family History: Reviewed history from 02/23/2008 and no changes required. DM HTN Mom? "heart problems" at 30 or 40 but has not had MI that he is aware of. Father and brother no heart problems, no stroke.  Social History: Reviewed history from 09/06/2008 and no changes required. [employer] [place], works on Information systems manager heavy items.   Physical Exam  General:  Vitals reviewed.  Alert, in no acute distress,  Neck:  short neck, no masses. No JVD Chest Wall:  tender to palpation Lungs:  normal respiratory effort and normal breath sounds.   Heart:  Distant heart sounds.  normal rate, regular rhythm, and no murmur.  No friction rub. Abdomen:  soft, non-tender, normal bowel sounds, no distention, and no masses.   Msk:  Left shoulder:  Painful with movement.   Chest: pain with abduction  Extremities:  no LE edema Neurologic:  alert & oriented X3, cranial nerves II-XII intact, strength normal in all extremities, and sensation intact to light touch.   Psych:  not anxious appearing and not depressed appearing.   Additional Exam:  EKG: ? strain inV5, V6    Impression & Recommendations:  Problem # 1:  CHEST PAIN UNSPECIFIED (ICD-786.50) Assessment New Pt with hx of HTN and gout.  His pain is substernal, worse with activity, and improved with NTG.  Will admit for rule out MI.  Cycle CEs, CXR.  Will get Echo because of his hx of gout.  Morphine as needed for pain.  Will start Lovenox full dose per pharmacy. Orders: EKG- [facility] (EKG) ASA 325mg  tab (EMRORAL) NTG 1/150 gr tab [facility])  Problem # 2:  GOUT (ICD-274.9) Continue Colcrys.  Check Uric Acid. His updated medication list for this  problem includes:    Allopurinol 300 Mg Tabs (Allopurinol) .Marland Kitchen... 1 tablet by mouth once a day    Colcrys 0.6 Mg Tabs (Colchicine) ..... One pill every hour for 4 hours today then one two times a day for one month  His updated medication list for this problem includes:    Allopurinol 300 Mg Tabs (Allopurinol) .Marland Kitchen... 1 tablet by mouth once a day    Colcrys 0.6 Mg Tabs (Colchicine) ..... One pill every hour for 4 hours today then one two times a day for one month  Problem # 3:  HYPERTENSION, BENIGN SYSTEMIC (ICD-401.1) Assessment: Unchanged Continue Toprol His updated medication list for this problem includes:    Toprol Xl 50 Mg Xr24h-tab (Metoprolol succinate) ..... One daily FEN/GI:  NPO except meds.  NS @ 75cc/hr  PPx: Lovenox  Dispo: Pending negative work up  Complete Medication List: 1)  Allopurinol 300 Mg Tabs (Allopurinol) .Marland Kitchen.. 1 tablet by mouth once a day 2)  Omeprazole 20 Mg Cpdr (Omeprazole) .... Take 1 capsule by mouth twice a day 3)  Toprol Xl 50 Mg Xr24h-tab (Metoprolol succinate) .... One daily 4)  Fish Oil 1000 Mg Caps (Omega-3 fatty acids) 5)  Aspirin 81 Mg Chew (Aspirin) .Marland Kitchen.. 1 by mouth once daily 6)  Sumatriptan Succinate 50 Mg Tabs (Sumatriptan succinate) .... Sig: take 1 tablet with onset ha; you may repeat in 2 hours if the headache persists. disp quant sufficient 1 month 7)  Flexeril 10 Mg Tabs (Cyclobenzaprine hcl) .... One three times a day as needed 8)  Hydrocodone-acetaminophen 5-500 Mg Tabs (Hydrocodone-acetaminophen) .... One q 6 hours as needed pain 9)  Colcrys 0.6 Mg Tabs (Colchicine) .... One pill every hour for 4 hours today then one two times a day for one month   Medication Administration  Medication # 1:    Medication: ASA 325mg  tab    Diagnosis: CHEST PAIN UNSPECIFIED (ICD-786.50)    Dose: 1 tablet    Route: po    Exp Date: 08/29/2010    Lot #: [id number]    Mfr: Major    Patient tolerated medication without complications    Given by: Garen Grams  LPN (September 05, 2010 10:26 AM)  Medication # 2:    Medication: NTG 1/150 gr tab    Diagnosis: CHEST PAIN UNSPECIFIED (ICD-786.50)    Dose: 1tablets    Route: SL    Exp Date: 01/04/2011    Lot #: [id number]    Mfr: Pfizer    Patient tolerated medication without complications    Given by: Garen Grams LPN (September 05, 2010 10:28 AM)  Orders Added: 1)  EKG- [facility] [EKG]  2)  ASA 325mg  tab [EMRORAL] 3)  NTG 1/150 gr tab [EMRORAL]     Medication Administration  Medication # 1:    Medication: ASA 325mg  tab    Diagnosis: CHEST PAIN UNSPECIFIED (ICD-786.50)    Dose: 1 tablet    Route: po    Exp Date: 08/29/2010    Lot #: [id number]    Mfr: Major    Patient tolerated medication without complications    Given by: Garen Grams LPN (September 05, 2010 10:26 AM)  Medication # 2:    Medication: NTG 1/150 gr tab    Diagnosis: CHEST PAIN UNSPECIFIED (ICD-786.50)    Dose: 1tablets    Route: SL    Exp Date: 01/04/2011    Lot #: [id number]    Mfr: Pfizer    Patient tolerated medication without complications    Given by: Garen Grams LPN (September 05, 2010 10:28 AM)  Orders Added: 1)  EKG- [facility] [EKG] 2)  ASA 325mg  tab [EMRORAL] 3)  NTG 1/150 gr tab [EMRORAL]

## 2010-09-12 NOTE — Initial Assessments (Signed)
Summary: Attending Note for Chest Pain Hospital Observation Admission  I saw [name] with Dr Lacretia Nicks. Shara Blazing.  I agree with his findings and plans for admission. Pt's PMH significant for OBESITY, HYPERTRIGLYCERIDEMIA, HYPERTENSION,  GASTROESOPHAGEAL REFLUX and APNEA, SLEEP on CPAP. Hx also significant for Cardiac Cath with no CAD - 06/18/2005.  Acute issues   Atypical left precordial chest pain: Onset at rest upon awakening this morning. Intermittent, sharp pain that has increased in frequency over the morning.  Some reliff with NTG SL in office.  Pt exam shows normal Vital signs, tenderness to palpation of left precordial musculature. distant Heart sounds, RRR.   EKG with NSR, strain pattern ST changes   Plan: Admit to [facility], R/O MI with serial cardiac enzymes and EKGs.  NTG SL as needed,  Pt received aspirin in office this morning. Will treat with treatment dose of Enoxaparin.   If chest pain persists in setting of unremarkable cardiac enzymes or if  abnormal cardiac enzymes, then consider consultation with Cardiology for further diagnsotic evaluation.   Low risk of VTE- check D-Dimer for further risk stratification.

## 2010-09-18 ENCOUNTER — Encounter: Payer: Self-pay | Admitting: Family Medicine

## 2010-09-20 NOTE — Letter (Signed)
Summary: Out of Work  [facility]  [address]   [place], [place] [address]   Phone: [phone]  Fax: [phone]    September 11, 2010   Employee:  [name]    To Whom It May Concern:   For Medical reasons, please excuse the above named employee from work for the following dates:  Start:   09/05/10  End:   09/13/10  If you need additional information, please feel free to contact our office.         Sincerely,    Luretha Murphy NP

## 2010-09-20 NOTE — Assessment & Plan Note (Signed)
Summary: [name],[name]   Vital Signs:  Patient profile:   45 year old male Height:      74 inches Weight:      310 pounds BMI:     39.95 Temp:     98.0 degrees F oral Pulse rate:   64 / minute BP sitting:   134 / 81  (right arm) Cuff size:   large  Vitals Entered By: Tessie Fass CMA (September 11, 2010 11:05 AM)  CC: hospital f/u chest pain Is Patient Diabetic? No Pain Assessment Patient in pain? no        Primary Care Provider:  Luretha Murphy NP  CC:  hospital f/u chest pain.  History of Present Illness: Hosptial follow up for atypical CP, negative work  up for MI.  Had cardiac cath 5 years ago that was negative.  Likely chest wall pain from lifting over 100 pound palates at work and tossing them the [employer].  He works in Optometrist at [employer].  Seen by cardiology and stree testing scheduled tomorrow.  Beta blocker changed from long acting to short acting during hospial stay.  Knows he has not been eating right, he and wife plan on getting back on Weight Watchers.  Triglycerides went from 110 in October 2011 to over 400 during this hospital stay.  Has very low HDL, and LDL under 130.  Habits & Providers  Alcohol-Tobacco-Diet     Tobacco Status: quit  Allergies: 1)  ! Penicillin V Potassium (Penicillin V Potassium)  Social History: Smoking Status:  quit  Review of Systems      See HPI CV:  Denies chest pain or discomfort, lightheadness, shortness of breath with exertion, swelling of feet, and swelling of hands.  Physical Exam  General:  Wll appearing, obese Chest Wall:  tender over left pectorial muscle Lungs:  normal respiratory effort and normal breath sounds.   Heart:  normal rate and regular rhythm.   Msk:  no joint tenderness, no joint swelling, and no joint warmth.     Impression & Recommendations:  Problem # 1:  CHEST PAIN UNSPECIFIED (ICD-786.50)  Chest wall pain, cautioned not to lift heavy objects overhead, may return to work tomorrow, stress  test tomorrow  Orders: [facility]- Est Level  3 (16109)  Problem # 2:  DE QUERVAIN'S TENOSYNOVITIS (ICD-727.04)  recommended splint to wear at night  Orders: [facility]- Est Level  3 (60454)  Problem # 3:  GOUT (ICD-274.9)  remain on colchicine for total of 8 weeks then discontinue, restrict intake of purines His updated medication list for this problem includes:    Allopurinol 300 Mg Tabs (Allopurinol) .Marland Kitchen... 1 tablet by mouth once a day    Colcrys 0.6 Mg Tabs (Colchicine) ..... One pill every hour for 4 hours today then one two times a day for one month  Orders: [facility]- Est Level  3 (99213)  Problem # 4:  HYPERTRIGLYCERIDEMIA (ICD-272.1) mostly dietary, restrict carbs and sugars, continue fish oil Orders: [facility]- Est Level  3 (09811)  Complete Medication List: 1)  Allopurinol 300 Mg Tabs (Allopurinol) .Marland Kitchen.. 1 tablet by mouth once a day 2)  Omeprazole 20 Mg Cpdr (Omeprazole) .... Take 1 capsule by mouth twice a day 3)  Fish Oil 1000 Mg Caps (Omega-3 fatty acids) 4)  Aspirin 81 Mg Chew (Aspirin) .Marland Kitchen.. 1 by mouth once daily 5)  Sumatriptan Succinate 50 Mg Tabs (Sumatriptan succinate) .... Sig: take 1 tablet with onset ha; you may repeat in 2 hours if  the headache persists. disp quant sufficient 1 month 6)  Flexeril 10 Mg Tabs (Cyclobenzaprine hcl) .... One three times a day as needed 7)  Hydrocodone-acetaminophen 5-500 Mg Tabs (Hydrocodone-acetaminophen) .... One q 6 hours as needed pain 8)  Colcrys 0.6 Mg Tabs (Colchicine) .... One pill every hour for 4 hours today then one two times a day for one month 9)  Metoprolol Tartrate 25 Mg Tabs (Metoprolol tartrate) .... One two times a day  Patient Instructions: 1)  Return in 2 months to check triglycerides and weight 2)  Restrict sugar and starch, and computer or anything that makes you sit and eat Prescriptions: HYDROCODONE-ACETAMINOPHEN 5-500 MG TABS (HYDROCODONE-ACETAMINOPHEN) one q 6 hours as needed pain  #20 x 0   Entered and Authorized by:    Luretha Murphy NP   Signed by:   Luretha Murphy NP on 09/11/2010   Method used:   Print then Give to Patient   RxID:   [id number] METOPROLOL TARTRATE 25 MG TABS (METOPROLOL TARTRATE) one two times a day  #60 x 6   Entered and Authorized by:   Luretha Murphy NP   Signed by:   Luretha Murphy NP on 09/11/2010   Method used:   Electronically to        [facility] (retail)       [address]       [place], [place]  [address]       Ph: [phone]       Fax: [phone]   RxID:   [id number] FLEXERIL 10 MG TABS (CYCLOBENZAPRINE HCL) one three times a day as needed  #90 x 1   Entered and Authorized by:   Luretha Murphy NP   Signed by:   Luretha Murphy NP on 09/11/2010   Method used:   Electronically to        [facility] (retail)       [address]       [place], [place]  [address]       Ph: [phone]       Fax: [phone]   RxID:   [id number]    Orders Added: 1)  [facility]- Est Level  3 [32355]

## 2010-09-25 DIAGNOSIS — Z9289 Personal history of other medical treatment: Secondary | ICD-10-CM

## 2010-09-25 HISTORY — DX: Personal history of other medical treatment: Z92.89

## 2010-10-01 NOTE — Discharge Summary (Signed)
[name], [name]                  ACCOUNT NO.:  [id number]  MEDICAL RECORD NO.:  [id number]            PATIENT TYPE:  LOCATION:                                 FACILITY:  PHYSICIAN:  Paula Compton, MD        DATE OF BIRTH:  [birth year]  DATE OF ADMISSION:  09/05/2010 DATE OF DISCHARGE:  09/06/2010                              DISCHARGE SUMMARY   PRIMARY CARE PROVIDER:  Luretha Murphy at [facility].  DISCHARGE DIAGNOSES: 1. Musculoskeletal chest wall pain. 2. Hypertension. 3. Gastroesophageal reflux disease. 4. Gout. 5. Obstructive sleep apnea.  DISCHARGE MEDICATIONS: 1. Metoprolol 25 mg by mouth twice daily. 2. Nitroglycerin 0.4 mg sublingually under tongue every 5 minutes as     needed up to 3 doses. 3. Allopurinol 300 mg by mouth daily. 4. Aspirin 81 mg by mouth daily. 5. Colchicine 0.6 mg by mouth twice daily. 6. Fish oil 1000 mg by mouth daily. 7. Flexeril 10 mg by mouth 3 times daily as needed for muscle spasm. 8. Hydrocodone/APAP 5/500 one tablet by mouth every 6 hours as needed     for pain. 9. Omeprazole 20 mg by mouth twice daily. 10.Sumatriptan 15 mg by mouth as needed at the onset of headache.  The patient was instructed to stop taking the following medications: Toprol-XL 50 mg by mouth daily.  CONSULTS:  Cardiology.  PROCEDURES:  EKG obtained on September 06, 2010, demonstrated no acute abnormality.  Labs on admission including CBC, basic metabolic, cardiac panel, and serum uric acid were unremarkable.  Hemoglobin A1c was 5.7. TSH was 0.684.  A D-dimer obtained on September 06, 2010, was less than 0.22.  Fasting lipid panel demonstrated total cholesterol of 172, triglycerides 427, HDL 24.  Troponins were negative x3.  BRIEF HOSPITAL COURSE:  This is a 45 year old male who was admitted to the hospital for atypical chest pain. 1. Chest pain:  The patient's chest pain upon admission was felt     likely to be atypical in nature.  The patient was  admitted, cardiac     enzymes were cycled, an EKG the following morning was unremarkable.     Unfortunately, the patient was not chest pain free on the day     following the admission.  Accordingly, Cardiology was consulted.     After examining and conversing with the patient, they felt that the     patient's pain was more consistent with musculoskeletal pain and     recommended that, if a D-dimer was negative, that the patient be     discharged with plans for an outpatient stress test followup. 2. Hypertension:  The patient was noted to be on Toprol-XL upon     presentation to the hospital.  He was given a single dose of this     and proceeded to drop his heart rate into the 40s.  EKG obtained at     that time demonstrated questionable first-degree block.     Accordingly, Toprol-XL was discontinued and metoprolol 25 mg b.i.d.     was started.  Following this chain, the  patient's heart rate was in     the 60s.  The patient's blood pressure was within the normal range     throughout the entire hospitalization. 3. Gastroesophageal reflux disease:  The patient was continued on his     home medications and no change were made during this     hospitalization. 4. Gout:  The patient was continued on his home medications and no     changes were made during this hospitalization. 5. Obstructive sleep apnea:  The patient was continued on his CPAP.     No changes were made during this hospitalization.  DISCHARGE INSTRUCTIONS:  The patient was discharged home with instructions to increase activity slowly, eat a low-sodium, heart- healthy diet, and stop any activity that causes chest pain, shortness of breath, dizziness, sweating, or excessive weakness.  FOLLOWUP APPOINTMENTS: 1. With Luretha Murphy, [facility] on September 11, 2010,     at 10:45 in the morning. 2. With Cardiology.  Cardiology had agreed to contact the patient the     following week to set up an outpatient stress  test.  ISSUES FOR FOLLOWUP: 1. It was recommended that the patient be encouraged to keep his     followup stress test with the cardiologist. 2. Hypertriglyceridemia:  The patient's triglycerides were noted to be     427 during this hospitalization.  Further followup as an outpatient     is recommended.  DISCHARGE CONDITION:  The patient was discharged home in stable medical condition.    ______________________________ Majel Homer, MD   ______________________________ Paula Compton, MD    ER/MEDQ  D:  09/08/2010  T:  09/09/2010  Job:  [id number]  cc:   Luretha Murphy  Electronically Signed by Manuela Neptune MD on 09/26/2010 10:47:26 AM Electronically Signed by Paula Compton MD on 10/01/2010 09:46:17 AM

## 2010-10-18 ENCOUNTER — Telehealth: Payer: Self-pay | Admitting: Family Medicine

## 2010-10-18 NOTE — Telephone Encounter (Signed)
Pt requesting xray results

## 2010-10-18 NOTE — Telephone Encounter (Signed)
Called pt. Pt reports that his shoulder pain returned. Pain 6/10. Muscle relaxer puts him to sleep and Ibuprofen is not helping. I advised the pt to schedule OV to be evaluated. Pt agreed. I also told him that I would send the message to S.Saxon. Pt's pharmacy is [facility] [place], [place]

## 2010-10-19 ENCOUNTER — Encounter: Payer: Self-pay | Admitting: *Deleted

## 2010-10-19 ENCOUNTER — Encounter: Payer: Self-pay | Admitting: Family Medicine

## 2010-10-19 ENCOUNTER — Ambulatory Visit (INDEPENDENT_AMBULATORY_CARE_PROVIDER_SITE_OTHER): Payer: 59 | Admitting: Family Medicine

## 2010-10-19 DIAGNOSIS — IMO0002 Reserved for concepts with insufficient information to code with codable children: Secondary | ICD-10-CM

## 2010-10-19 DIAGNOSIS — M25519 Pain in unspecified shoulder: Secondary | ICD-10-CM

## 2010-10-19 DIAGNOSIS — M5412 Radiculopathy, cervical region: Secondary | ICD-10-CM

## 2010-10-19 DIAGNOSIS — G8929 Other chronic pain: Secondary | ICD-10-CM

## 2010-10-19 MED ORDER — HYDROCODONE-ACETAMINOPHEN 5-500 MG PO TABS: 1.0000 | ORAL_TABLET | Freq: Four times a day (QID) | ORAL | Status: DC | PRN

## 2010-10-19 NOTE — Progress Notes (Signed)
  Subjective:    Patient ID: [name], male    DOB: 15-Dec-1965, 45 y.o.   MRN: [id number]  Shoulder Pain  The pain is present in the left shoulder (left chest). This is a chronic problem. The current episode started more than 1 month ago. There has been a history of trauma. The problem occurs 2 to 4 times per day. The problem has been gradually worsening. The quality of the pain is described as sharp. The pain is at a severity of 7/10. The pain is moderate. Associated symptoms include an inability to bear weight and a limited range of motion. Pertinent negatives include no fever, joint locking, joint swelling, numbness, stiffness or tingling. The symptoms are aggravated by activity. He has tried NSAIDS and oral narcotics for the symptoms. The treatment provided mild relief. Family history includes gout.  Pt. Has decreased range of motion in left shoulder and radicular dermatome pain. He has no specific traumatic event, but aggravates it when lifting boxes.  Chest Pain: He has left pectoral muscle symptoms. He has no chest pain suggesting heart disease. He has no angina, no diaphoresis, no nausea vomiting, no radiation. He has recently been ruled out at [facility] during an admission.   Review of Systems  Constitutional: Negative for fever.  Musculoskeletal: Negative for stiffness.  Neurological: Negative for tingling and numbness.       Objective:   Physical Exam  Cardiovascular: Normal rate, regular rhythm and normal heart sounds.   Pulmonary/Chest: Effort normal and breath sounds normal. He has no wheezes. He exhibits tenderness.  Musculoskeletal:       Left shoulder: He exhibits decreased range of motion, tenderness and pain. He exhibits no bony tenderness, no swelling, no effusion, no crepitus, no deformity and normal strength.       Assessment & Plan:  Pt. Is a 45 y/o male with acute on chronic left shoulder pain. 1. This could be a MSK shoulder disease or cervical nerve  compression. - MRI neck/shoulder Continue to take vicodin/ibuprofen/flexeril as needed. Do not take when working machinery, driving or anything that requires a clear sensorium. Pt. Advised about this. 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 I told him to follow up with Luretha Murphy concerning this.

## 2010-10-19 NOTE — Patient Instructions (Signed)
Please talk to [name] concerning your short term disability. Obtain the MRI of your neck and shoulder, this will help identify the cause of your pain. Do not take pain medication or flexeril when you are working, driving, or anything requiring 100% attention.  Feel better

## 2010-10-22 NOTE — Telephone Encounter (Signed)
May be best to schedule him with [facility] clinic so that they can inject his shoulder, Xrays were negative.

## 2010-10-24 ENCOUNTER — Ambulatory Visit (HOSPITAL_COMMUNITY)
Admission: RE | Admit: 2010-10-24 | Discharge: 2010-10-24 | Disposition: A | Discharge status: Home / Self Care | Payer: 59 | Source: Ambulatory Visit | Attending: Family Medicine | Admitting: Family Medicine

## 2010-10-24 ENCOUNTER — Telehealth: Payer: Self-pay | Admitting: Family Medicine

## 2010-10-24 DIAGNOSIS — M79609 Pain in unspecified limb: Secondary | ICD-10-CM | POA: Insufficient documentation

## 2010-10-24 DIAGNOSIS — M5412 Radiculopathy, cervical region: Secondary | ICD-10-CM

## 2010-10-24 DIAGNOSIS — G8929 Other chronic pain: Secondary | ICD-10-CM

## 2010-10-24 DIAGNOSIS — M25519 Pain in unspecified shoulder: Secondary | ICD-10-CM | POA: Insufficient documentation

## 2010-10-24 NOTE — Telephone Encounter (Signed)
Pt informed and number given to call [facility]

## 2010-10-24 NOTE — Telephone Encounter (Signed)
Pt asking for excuse note for work to not return until after his appt with [facility] on 3/30.

## 2010-10-25 ENCOUNTER — Telehealth: Payer: Self-pay | Admitting: *Deleted

## 2010-10-25 NOTE — Telephone Encounter (Signed)
Called pt and advise to discuss with S.Saxon tomorrow [name]

## 2010-10-25 NOTE — Telephone Encounter (Signed)
Will discuss tomorrow [name]

## 2010-10-26 ENCOUNTER — Encounter: Payer: Self-pay | Admitting: Family Medicine

## 2010-10-26 ENCOUNTER — Ambulatory Visit (INDEPENDENT_AMBULATORY_CARE_PROVIDER_SITE_OTHER): Payer: 59 | Admitting: Family Medicine

## 2010-10-26 VITALS — BP 120/80 | HR 60 | Wt 310.7 lb

## 2010-10-26 DIAGNOSIS — M549 Dorsalgia, unspecified: Secondary | ICD-10-CM

## 2010-10-26 DIAGNOSIS — G8929 Other chronic pain: Secondary | ICD-10-CM

## 2010-10-26 NOTE — Progress Notes (Signed)
  Subjective:    Patient ID: [name], male    DOB: [birth year], 45 y.o.   MRN: [id number]  HPI  [name] is here to follow up for recurring back pain that radiated through his chest.  He was admitted to the hospital in February for chest pain that was atypical, he in patient work up was negative for MI, he had a follow up stress test with cardiology that was also negative for ischemia.  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  Mostly the pain has been lower back and pectoral.  This pain is subscapular/rhomboid.  He was seen by Dr. Rivka Safer several weeks ago and he order a shoulder and C spine MRI, both of which were negative.  [name] has been resistant to going to PT through the years, because of time away from work and co-payments.  He is obese, and has received counseling in our encounters on diet and exercise.  He joined the [employer] and goes regularly but often sits in the  Hot tub and swims under water.  He will need short term disability for this time out of work, I will complete the forms, I would like him to see Dr. Jennette Kettle in [facility] clinic so that we can get him back to work.   Review of Systems  Constitutional: Negative for fever and fatigue.  HENT: Negative for neck pain and neck stiffness.   Respiratory: Negative for shortness of breath.   Cardiovascular: Negative for chest pain and leg swelling.  Musculoskeletal:       See HPI       Objective:   Physical Exam  Constitutional:       Large [place] male, alert, in no acute distress  Musculoskeletal:       Full ROM and strength of UE.  He is most tender in the left rhomboid major and minor muscle groups.   Psychiatric: His behavior is normal.          Assessment & Plan:

## 2010-10-26 NOTE — Patient Instructions (Signed)
Begin simple rhomboid exercises no weights May swim under the water May use the treadmill Be careful with pain meds Dr. Jennette Kettle in one week

## 2010-10-29 ENCOUNTER — Encounter: Payer: Self-pay | Admitting: Family Medicine

## 2010-10-29 DIAGNOSIS — G8929 Other chronic pain: Secondary | ICD-10-CM | POA: Insufficient documentation

## 2010-10-29 DIAGNOSIS — M549 Dorsalgia, unspecified: Secondary | ICD-10-CM | POA: Insufficient documentation

## 2010-10-29 HISTORY — DX: Other chronic pain: G89.29

## 2010-10-29 NOTE — Assessment & Plan Note (Signed)
He has had a number of musculoskeletal pain syndromes, this appears to be in the subscapular, rhomboid muscle group.  He lifts heavy objects overhead at work and ? His commitment to this type of difficult work.  He does have to stay employed.  He has been open to discussion regarding life-style change but has not made any major headway to lose weight.  SM referral at this time.

## 2010-10-30 ENCOUNTER — Encounter: Payer: Self-pay | Admitting: Family Medicine

## 2010-11-02 ENCOUNTER — Telehealth: Payer: Self-pay | Admitting: Family Medicine

## 2010-11-02 ENCOUNTER — Ambulatory Visit (INDEPENDENT_AMBULATORY_CARE_PROVIDER_SITE_OTHER): Payer: 59 | Admitting: Family Medicine

## 2010-11-02 DIAGNOSIS — G8929 Other chronic pain: Secondary | ICD-10-CM

## 2010-11-02 DIAGNOSIS — M25519 Pain in unspecified shoulder: Secondary | ICD-10-CM

## 2010-11-02 DIAGNOSIS — M25512 Pain in left shoulder: Secondary | ICD-10-CM

## 2010-11-02 DIAGNOSIS — M549 Dorsalgia, unspecified: Secondary | ICD-10-CM

## 2010-11-02 NOTE — Telephone Encounter (Signed)
Will fwd. To Dr.Neal Arlyss Repress

## 2010-11-02 NOTE — Progress Notes (Signed)
Subjective:    Patient ID: [name], male    DOB: 12-Dec-1965, 45 y.o.   MRN: [id number]  HPI A [age]-year-old male referred to [place] by Rosalio Macadamia at the family practice Center for evaluation of chronic left shoulder pain and left lower back and buttock pain. 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 He states that 5 years ago he tried to catch a 400 pound package that really bent his arm and shoulder back and cause significant pain. He did not report it to [employer]. He did see a doctor and got a pain medicine which helped. He is able to work for last few years, but states over the last few weeks the shoulder pain has gotten worse. He states he is unable to work and is current out of work at this time. He did not have a new injury that he is aware of. He did get an MRI of the neck and shoulder that was ordered by Dr. Wilkie Aye. MRI of the left shoulder is essentially normal except for some small subchondral cyst. His C-spine MRI was also normal. Pain is worse along anterior joint line and radiates inferiorly under the axilla. Per patient, it appears to her with most movements but at the worst is with abduction and external rotation positions.  Regarding his left low back pain he states that a little over a year ago he landed hard on a metal partial portion of the forklift. He states he has had pain since that is a dull ache sensation, but then will radiate down his leg and into his toes. He states he does get numbness with prolonged walking in the left foot. He has had a couple sets of lumbar x-rays a couple years ago they were entirely normal. He has not had an MRI of the back. He has not done any physical therapy. His pain is mostly in the buttock region, but he does point up towards his SI joints as well. Vicodin is the only thing that helps this pain as well.   Review of Systems He denies fevers,  sweats, chills, weight loss, changes in appetite, change in bowel or bladder, and he denies any personal or family history of prostate cancer.    Objective:   Physical Exam Gen. appearance: Obese slightly disheveled male no distress Neck: Supple, negative Spurling's bilaterally Left shoulder: Full range of motion. Mild TTP the patient along the anterior joint line, minimal to palpation of the supraspinatus. No tenderness of the biceps tendon. Negative impingement signs. Rotator cuff strength is entirely intact. Negative speed's and Yergason's. ? positive O'Brien's, ? Positive crank and load and shift test. Positive pain with apprehension, but does not have a true dislocation type feeling. Pain is not relieved with relocation test. Low back: Minimal tenderness palpation of the left PSIS and SI joint region. Mild left paralumbar muscle spasm. No tenderness to palpation over the left buttock region. Mildly positive Faber's on the left side. He is extremely tight hamstrings and hip abductor and gluteal muscles. He has a negative straight leg raise bilaterally both supine and seated. Neuro: 2+ patellar deep tendon reflexes bilaterally, 1+ ankle tendon reflexes bilaterally. He has normal lower extremity strength diffusely.   Assessment & Plan:  Chronic left shoulder pain: He has had a normal MRI, but his mechanism of injury is concerning for past possible shoulder subluxation and potential labral injury. Based on his location  of pain, mechanism, and positive O'Brien's, there is chance he does have some true pathology of his labrum, but more likely a chronic pain syndrome without organic etiology. We elected to do a glenohumeral injection today, see procedure note below. Hopefully this will be both therapeutic and diagnostic. Referral to physical therapy. Work restrictions showing 20 pound lifting maximum, occasional repetitive work, frequent bending stooping and climbing, and no restrictions on sitting.  Followup in 3 weeks.  Chronic left lower back and buttock pain: He has had negative prior lumbar x-rays. He has never had an MRI. At this point without red flag signs and symptoms, really does not necessitate MRI at this point. Referral to physical therapy. I had initially planned on doing a PSIS trigger point injection versus SI joint injection, however when beginning to attempt this he pointed more to his mid and lateral buttock as his main source of pain, which I was uncomfortable injecting secondary to possible sciatic nerve location. Followup in 3 weeks.  Consent obtained and verified. Sterile betadine prep. Furthur cleansed with alcohol. Topical analgesic spray: Ethyl chloride. Joint: Lt GH Approached in typical fashion with: posterior approach aimed at coracoid Completed without difficulty Meds: 1 cc kenalog 40 mg and 7 cc 1% lidocaine Needle: 21 G 1.5 inch Aftercare instructions and Red flags advised.

## 2010-11-02 NOTE — Telephone Encounter (Signed)
Saw Dr Jennette Kettle today and she didn't write him a note to be out - only gave note stating limited lifting. He wants a note to be out so it won't affect his disability

## 2010-11-05 NOTE — Telephone Encounter (Signed)
NEETON / AMY I am not going to give him a note for out of work--he CAN work under the restrictions I provided in his paperwork. they may  NOT have a job for him that meets those criteria--but he does not need to be totally out of work. I gave him lfting and pushing restrictions. Please call him and let him know this THANKS! Denny Levy

## 2010-11-05 NOTE — Telephone Encounter (Signed)
Patient was called, he cannot do his job with weight restrictions.  He will begin physical therapy this week.

## 2010-11-20 ENCOUNTER — Ambulatory Visit: Payer: 59 | Attending: Family Medicine

## 2010-11-20 DIAGNOSIS — R5381 Other malaise: Secondary | ICD-10-CM | POA: Insufficient documentation

## 2010-11-20 DIAGNOSIS — M25519 Pain in unspecified shoulder: Secondary | ICD-10-CM | POA: Insufficient documentation

## 2010-11-20 DIAGNOSIS — M2560 Stiffness of unspecified joint, not elsewhere classified: Secondary | ICD-10-CM | POA: Insufficient documentation

## 2010-11-20 DIAGNOSIS — IMO0001 Reserved for inherently not codable concepts without codable children: Secondary | ICD-10-CM | POA: Insufficient documentation

## 2010-11-23 ENCOUNTER — Ambulatory Visit (INDEPENDENT_AMBULATORY_CARE_PROVIDER_SITE_OTHER): Payer: 59 | Admitting: Family Medicine

## 2010-11-23 ENCOUNTER — Encounter: Payer: Self-pay | Admitting: Family Medicine

## 2010-11-23 DIAGNOSIS — G8929 Other chronic pain: Secondary | ICD-10-CM

## 2010-11-23 DIAGNOSIS — M25519 Pain in unspecified shoulder: Secondary | ICD-10-CM

## 2010-11-23 NOTE — Progress Notes (Signed)
  Subjective:    Patient ID: [name], male    DOB: 02-17-1966, 45 y.o.   MRN: [id number]  HPI [name] comes in to followup on his left shoulder pain. This stems from an injury 5 years ago as previously described my last note. He states he has been on short-term disability since March of 2012. 3 weeks ago we injected his left shoulder the glenohumeral joint, he's not sure this provided much relief. His continued use of Vicodin for pain. He has had one physical therapy visit, but has been doing some home stretching exercises. He thinks overall his pain is about 30% improved, and he states he would like to go back to work but only if he thinks he can. 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   Review of Systems Denies fever, sweats, chills, weight loss    Objective:   Physical Exam Appearance: Overweight unkempt male in no distress The left shoulder: Full range of motion. Completely intact rotator cuff strength. Negative impingement signs. Negative O'Brien's test. Minimal pain with crank test. Neuro: Completely normal strength in the bilateral upper extremities including deltoid, biceps, triceps, and all muscle the hand and wrist.       Assessment & Plan:  Persistent left shoulder pain, and currently on short-term disability because of this pain. -Reviewed with the patient that at this time he has no obvious signs on physical exam nor on MRI that prevent him from working. -At this time, continue physical therapy. I stressed that he must be able to go to therapy 8 more visits over the next 4-6 weeks and then followup with [place]. At that time would expect him to be at maximal improvement and be able to go back to work. -Followup 4-6 weeks

## 2010-11-23 NOTE — Patient Instructions (Signed)
Same work restrictions for now. Must go to physical therapy at least 2 times per week for next 4 weeks for a MINIMUM of 8 visits. Then follow up with [place] in about 4 weeks to reassess and at that point you will likely be at maximal improvement and we expect you will be able to return to work without restriction.

## 2010-11-26 ENCOUNTER — Ambulatory Visit: Payer: 59 | Admitting: Physical Therapy

## 2010-11-28 ENCOUNTER — Ambulatory Visit: Payer: 59 | Admitting: Physical Therapy

## 2010-11-30 ENCOUNTER — Telehealth: Payer: Self-pay | Admitting: Family Medicine

## 2010-11-30 NOTE — Telephone Encounter (Signed)
[name] would like to know if you have received forms from [employer] concerning his disability.  Please call him back to let him know.  Want to make sure they were sent to you and not somewhere else.

## 2010-12-01 ENCOUNTER — Other Ambulatory Visit: Payer: Self-pay | Admitting: Family Medicine

## 2010-12-01 NOTE — Telephone Encounter (Signed)
Refill request

## 2010-12-03 NOTE — Telephone Encounter (Signed)
Apparently the forms are with the disability claims dept; they were completed by Dr. Jennette Kettle and he is currently in [facility] clinic and will be re-evaluated at [facility] clinic on 01/02/11.  He has not yet received any month and is upset.

## 2010-12-03 NOTE — Telephone Encounter (Signed)
called

## 2010-12-04 ENCOUNTER — Ambulatory Visit: Payer: 59 | Attending: Family Medicine

## 2010-12-04 DIAGNOSIS — IMO0001 Reserved for inherently not codable concepts without codable children: Secondary | ICD-10-CM | POA: Insufficient documentation

## 2010-12-04 DIAGNOSIS — R5381 Other malaise: Secondary | ICD-10-CM | POA: Insufficient documentation

## 2010-12-04 DIAGNOSIS — M2560 Stiffness of unspecified joint, not elsewhere classified: Secondary | ICD-10-CM | POA: Insufficient documentation

## 2010-12-04 DIAGNOSIS — M25519 Pain in unspecified shoulder: Secondary | ICD-10-CM | POA: Insufficient documentation

## 2010-12-06 ENCOUNTER — Ambulatory Visit: Payer: 59

## 2010-12-10 ENCOUNTER — Ambulatory Visit: Payer: 59 | Admitting: Family Medicine

## 2010-12-10 ENCOUNTER — Telehealth: Payer: Self-pay | Admitting: Family Medicine

## 2010-12-10 ENCOUNTER — Ambulatory Visit: Payer: 59

## 2010-12-10 MED ORDER — OMEPRAZOLE 20 MG PO CPDR: 20.0000 mg | DELAYED_RELEASE_CAPSULE | Freq: Two times a day (BID) | ORAL | Status: DC

## 2010-12-10 NOTE — Telephone Encounter (Signed)
Mr. Noxon called to say when he picked up his rx for his omeprazole, he noticed that the rx was on for 30 tabs and he has to take 2 daily,therefore his rx should have been for 60.  He said that the price he paid for the 30 would have been the same for the 60.  He will run out of his meds before month's end and is requesting rx to be sent for the 60 day quantity.

## 2010-12-12 ENCOUNTER — Ambulatory Visit: Payer: 59 | Admitting: Physical Therapy

## 2010-12-18 ENCOUNTER — Ambulatory Visit: Payer: 59

## 2010-12-20 ENCOUNTER — Telehealth: Payer: Self-pay | Admitting: Family Medicine

## 2010-12-20 ENCOUNTER — Ambulatory Visit: Payer: 59

## 2010-12-20 MED ORDER — OMEPRAZOLE 20 MG PO CPDR: 20.0000 mg | DELAYED_RELEASE_CAPSULE | Freq: Two times a day (BID) | ORAL | Status: DC

## 2010-12-20 NOTE — Telephone Encounter (Signed)
Patient stopped by concerning his call about his rx written incorrectly.  He needs it called in to his pharmacy.  He uses [facility] [address].

## 2010-12-21 NOTE — Discharge Summary (Signed)
   [name], [name]                              ACCOUNT NO.:  [id number]   MEDICAL RECORD NO.:  [id number]                   PATIENT TYPE:  INP   LOCATION:  3739                                 FACILITY:  [facility]   PHYSICIAN:  Marisue Brooklyn, M.D.                 DATE OF BIRTH:  1965-12-14   DATE OF ADMISSION:  03/12/2002  DATE OF DISCHARGE:                                 DISCHARGE SUMMARY   HOME ADDRESS AND TELEPHONE NUMBER:  [address], [place], [place], [address].  Phone:  [phone]   DICTATION CUT OFF                                                Marisue Brooklyn, M.D.    FM/MEDQ  D:  03/15/2002  T:  03/19/2002  Job:  [id number]

## 2010-12-21 NOTE — H&P (Signed)
[name], [name]                  ACCOUNT NO.:  [id number]   MEDICAL RECORD NO.:  [id number]          PATIENT TYPE:  INP   LOCATION:  2025                         FACILITY:  [facility]   PHYSICIAN:  Santiago Bumpers. Hensel, M.D.DATE OF BIRTH:  [birth year]   DATE OF ADMISSION:  05/17/2005  DATE OF DISCHARGE:  05/18/2005                                HISTORY & PHYSICAL   HISTORY OF PRESENT ILLNESS:  This is a 45 year old male who presents with  substernal chest pain described as pressure, lasting 3 hours, radiating to  the left arm, associated with shortness of breath, palpitations, numbness,  and tingling of the left fingers.  Chest pain was precipitated by exertion  and slightly relieved by rest.  There was no associated diaphoresis.  The  patient has been having episodes of chest pain for the past 6 months, but  noted worsening of the chest pain this morning, in terms of severity and  duration.  The patient also complains of progressive shortness of breath  over the past 6 months, and noted that he was getting short of breath after  walking for only 1/4 of a mile.  There is a history of paroxysmal nocturnal  dyspnea, exertional dyspnea.  There is no associated fever, weight loss,  anorexia, cough, orthopnea, or lower extremity edema.   PAST MEDICAL HISTORY:  Questionable hypertension.  He was last seen at the  family practice center about 2 years ago for a physical examination.  The  patient states that blood pressure readings have been 149-154/95-110.   MEDICATIONS:  Pepcid AD.   ALLERGIES:  PENICILLIN.   PROCEDURE:  None.   FAMILY HISTORY:  Heart disease in the mother.  Diabetes mellitus in the  aunt.  No stroke, no hypertension.   PERSONAL AND SOCIAL HISTORY:  The patient is married, with 3 children.  He  lives with his wife and youngest child.  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  He used to smoke about 2 packs per day for 8 years.  He  quit in 1994.  He does not  have any alcohol use.   REVIEW OF SYSTEMS:  12-system review negative except for symptoms mentioned  in the HPI.  He also has a history of headache, occasional nausea, and  postprandial epigastric burning pain.  He does have daytime somnolence,  fatigue, and his wife mentions that he snores at night and catches his  breath during his sleep.   PHYSICAL EXAMINATION:  VITAL SIGNS:  Blood pressure 149/88; heart rate 67;  respiratory rate 18; O2 saturation 100%.  GENERAL:  This is a pleasant male, not in cardiorespiratory distress.  HEENT:  Normocephalic.  Pupils equally round and reactive to light  bilaterally.  Tympanic membranes with normal landmarks.  No tonsil or  pharyngeal congestion.  NECK:  No jugular venous distention, no thyromegaly, no carotid bruits.  CHEST:  No tenderness to palpation of the anterior chest.  LUNGS:  Clear to auscultation bilaterally.  No crackles, no wheezes.  HEART:  Adynamic precordium.  No thrills, no heaves.  Regular rate and  rhythm.  Normal S1 and S2.  No S3 or S4.  No murmurs appreciated.  ABDOMEN:  Obese.  Normal bowel sounds.  Soft.  No masses or tenderness.  EXTREMITIES:  No edema or cyanosis.  Pedal pulses 2+.  NEUROLOGIC:  Cranial nerves II-XII intact.  Motor strength 5/5 in both upper  and lower extremities.  Reflexes 2+.   LABORATORY:  Hemoglobin 14.3, hematocrit 40.9, WBC 6.7, platelets 258.  Sodium 138, potassium 3.9, chloride 106, CO2 25, BUN 10, creatinine 0.9,  glucose 105, total protein 7.1, albumin 4.2, AST 38, alkaline phosphatase  68, ALT 40, total bilirubin 0.8.  Point of care cardiac enzymes normal.  Chest x-ray showed normal results.  EKG showed sinus rhythm, with first-  degree AV block.   ASSESSMENT:  A 45 year old obese male, presenting with chest pain.   1.  Chest pain.  Differential diagnosis includes unstable angina, myocardial      infarction, gastroesophageal reflux disease, pneumonia, pulmonary      embolism.  The patient  has a possible history of hypertension, obesity,      although unknown cholesterol status.  Will need to admit to telemetry to      rule out an acute coronary event.  Will cycle enzymes and repeat EKG.      Check fasting lipid panels and TSH.  Will give oxygen, nitroglycerin,      morphine, aspirin, and beta blocker.  Will need to risk stratify the      patient with a stress test on an outpatient basis.  2.  Progressive shortness of breath.  Concern for possible congestive heart      failure, although on physical examination there are no failure signs.      Will check BNP and do a 2D echocardiogram to determine systolic      function.  Will monitor I&Os q.shift.  3.  Hypertension.  Start metoprolol 50 mg p.o. b.i.d.  Will monitor blood      pressure.  4.  Obstructive sleep apnea.  History suggestive of obstructive sleep apnea      with daytime somnolence., fatigue, snoring, and catching of breath      during sleep.  Will need an outpatient sleep study.  5.  Gastroesophageal reflux disease.  Will start Protonix 40 mg p.o. daily.  6.  Fluid, electrolytes, and nutrition.  Will start heart-healthy diet.  7.  Social.  Discussed with the patient and his wife the need for admission.      Addressed their questions and concerns.  They are in agreement with plan      of care.      Lawerance Sabal, MD    ______________________________  Santiago Bumpers Leveda Anna, M.D.    MC/MEDQ  D:  05/19/2005  T:  05/19/2005  Job:  [id number]   cc:   Darl Pikes __________  [facility]

## 2010-12-21 NOTE — Discharge Summary (Signed)
[name], [name]                              ACCOUNT NO.:  [id number]   MEDICAL RECORD NO.:  [id number]                   PATIENT TYPE:  INP   LOCATION:  3739                                 FACILITY:  [facility]   PHYSICIAN:  Marisue Brooklyn, M.D.                 DATE OF BIRTH:  [birth year]   DATE OF ADMISSION:  03/12/2002  DATE OF DISCHARGE:  03/15/2002                                 DISCHARGE SUMMARY   DISCHARGE DIAGNOSIS:  Noncardiac chest pain.   ADMISSION DIAGNOSIS:  Chest pain, rule out cardiac disease.   PAST MEDICAL HISTORY:  1. Sinus congestion.  2. History of heartburn.   DISCHARGE MEDICATIONS:  1. Vioxx 25 mg q.d. p.r.n.  2. Flonase two sprays per nostril q.d.  3. Protonix one p.o. q.d. p.r.n.   FOLLOW-UP:  Follow-up appointment on Tuesday, March 23, 2002, at 3 p.m. at  the clinic here.  Another follow-up appointment on Wednesday, April 21, 1001, at 8:30 p.m. at the [facility].  The second appointment may be  rescheduled by the patient.   CONDITION ON DISCHARGE:  The patient is in good condition at discharge.   PROCEDURES:  The patient had an echocardiogram which was completely normal  and showed an ejection fraction of 55-65%.  The patient had a treadmill  stress test which was normal and diagnostic for a noncardiac source of the  patient.   CHIEF COMPLAINT:  Chest pain.   HISTORY OF PRESENT ILLNESS:  The patient is a 45 year old white male with a  family history of heart disease and early MI, presenting with recently  worsening chest pain described as feeling like his heart is fluttering and  beating out of his chest with associated intermittent sharp left chest pain  which radiates to his back.  The pain today began after waking and has been  8-10/10 pain which comes and goes.  The patient's first pain episode was  about one year ago.  The pain is once every day or two, but has become  gradually more frequent so it is now many times per day and night.   The  patient feels diaphoretic, faint, and short of breath during the pain  episodes.  The pain occurs at any time and is relieved only by sleep.  The  patient has two-pillow orthopnea and sleeps fitfully, sometimes waken up by  pain.   ALLERGIES:  No known drug allergies.   PAST MEDICAL HISTORY:  History of heartburn and sinus congestion.  No  surgeries.  No hospitalizations.   MEDICATIONS:  1. Metamucil q.d.  2. Flonase p.r.n.  3. Tylenol p.r.n.  4. Tylenol Sinus p.r.n.   SOCIAL HISTORY:  The patient is married.  He lives with his wife and  children.  The patient is a former tobacco and marijuana smoker.  He does  not use any other drugs.  He does  not drink alcohol.  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   FAMILY HISTORY:  His mother is 57 and has hypertension and cardiac disease.  His father is healthy.  A brother is 47 and healthy.  Other family history  notable for a paternal uncle who died at age 18 from an MI.   REVIEW OF SYMPTOMS:  Positive of fatigue, chest pain, palpitations, dyspnea,  orthopnea, bloating, muscle pain in the back and right shoulder, frequent  headaches, and some dizziness.   PHYSICAL EXAMINATION:  VITAL SIGNS:  Temperature 97.4 degrees, blood  pressure 122/80, pulse 96, respirations 14.  GENERAL APPEARANCE:  The patient is alert, oriented, and mildly anxious in  bed.  HEENT:  Eyes:  Pupils equal, round, and reactive to light.  Extraocular  movements intact.  Fundi with sharp disks seen bilaterally.  No retinal  bleeds.  ENT:  Oropharynx clear.  Multiple caries seen.  RESPIRATORY:  Lungs clear bilaterally.  Fair air movements.  No wheezes or  rhonchi.  CARDIOVASCULAR:  Bradycardic.  Regular rhythm.  No murmur, rub, or gallop.  GASTROINTESTINAL:  Distended abdomen.  Nontender.  Active bowel sounds.  EXTREMITIES:  Distal pulses 2+.  Deep tendon reflexes 2+.  MUSCULOSKELETAL:  Normal strength.  No focal weakness.  NEUROLOGIC:  Cranial  nerves intact.   LABORATORY DATA:  The Chem-7 was 141, 3.8, 108, 20, 19, 1.3, and 98.  The  CBC showed a white count of 3.7, platelets 231, hemoglobin 15, hematocrit  45, MCV 84.1.  Cardiac enzymes were negative x 3.   The chest x-ray showed no acute changes.  The EKG showed a rate of 64 and PR  interval 217.   ASSESSMENT AND PLAN:  1. Chest pain, etiology unclear.  Cardiac source suggested by positive     family history, abnormal, EKG, diaphoresis, shortness of breath,     palpitations, and cardiomegaly on chest x-ray.  Work-up:  Cardiac enzymes     x 3, echocardiogram, and repeat EKGs.  Other possible causes include acid     reflux, anxiety, and musculoskeletal pain from overexertion.  We will     treat empirically with Protonix and Xanax for anxiety.  Will obtain PT     and PTT to look for a coagulopathy suggestive of PE.  Will get cardiology     consult.  2. Abdominal distention likely related to the chief complaint.  Will     evaluate liver function, obtain KUB, and   Dictation ended at this point.                                                 Marisue Brooklyn, M.D.    FM/MEDQ  D:  03/15/2002  T:  03/19/2002  Job:  [id number]

## 2010-12-21 NOTE — H&P (Signed)
NAMEKAIDIN, BOEHLE                  ACCOUNT NO.:  [id number]   MEDICAL RECORD NO.:  [id number]          PATIENT TYPE:  INP   LOCATION:  1827                         FACILITY:  [facility]   PHYSICIAN:  Kerby Nora, MD        DATE OF BIRTH:  1965-11-17   DATE OF ADMISSION:  05/29/2005  DATE OF DISCHARGE:                                HISTORY & PHYSICAL   CHIEF COMPLAINT:  Chest pain.   HISTORY OF PRESENT ILLNESS:  Mr. Withrow is a 45 year old male with a recent  hospitalization on October 13 through May 18, 2005 for atypical chest  pain who presents this afternoon with chest pain again.  He describes at  least a six-month history of intermittent, sharp left-sided chest pain that  is worse in the morning.  It is present when he wakes up and goes away when  he gets out of bed and becomes active.  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  Yesterday the patient  experienced normal, this chest pain is normal for him at about 10 a.m. but  it continued and was worse in quality than usual and he went home from work  to rest.  He took the nitroglycerin that he received from his last  hospitalization on October 14 and the pain resolved somewhat.  This morning  the pain returned and continued to be worse than usual and not tolerable so  Mr. Vantol came to the [facility] for treatment.  At his  hospitalization on October 13 he was ruled out for MI, had negative cardiac  enzymes, and normal EKG.  The team there recommended that he follow up as an  outpatient and have a cardiac work-up including exercise stress test.  The  chest pain that he presents with today is associated with left arm weakness  and pain, left jaw pain, and left neck pain.  It is also associated with  shortness of breath and sweating.  The patient denies nausea and vomiting.  The pain is not associated  with changes in position or eating.  It is worse  in the morning and decreases throughout the course of the day.  The patient  does have some shortness of breath with the pain and normally the pain is  intermittent but today it has been constant for about 40 minutes.  In the ER  at [facility] he received aspirin and nitroglycerin x1.   PAST MEDICAL HISTORY:  1.  History of chest pain with an admission on May 17, 2005 for rule out      MI where he had negative cardiac enzymes, negative EKG, and an      echocardiogram that showed a normal ejection fraction and mildly dilated      right ventricle and a slightly thickened left ventricle.  He was also      admitted for chest pain in 2003 where he also had a negative cardiac  work-up.  At the admission in 2003 he also had a negative exercise      stress test.  2.  Hypertension.  Mr. Benninger has hypertension that is well controlled with      metoprolol 50 mg p.o. b.i.d. and HCTZ 12.5 mg p.o. daily.  3.  Dyslipidemia.  He takes Niacin 250 mg b.i.d.  4.  Possible sleep apnea.  He had a sleep study done in 2003 for which we do      not have the results.  He cannot remember the results.  He does snore.  5.  History of GERD treated in the past with Pepcid AC.   HOME MEDICATIONS:  1.  Aspirin 81 mg p.o. daily.  2.  Metoprolol 50 mg p.o. b.i.d.  3.  HCTZ 12.5 mg p.o. daily.  4.  Niacin 250 mg p.o. b.i.d.  5.  Nitroglycerin 0.4 mg sublingual p.r.n. chest pain.   ALLERGIES:  PENICILLIN.   FAMILY HISTORY:  His mother is living at age 60 and she has coronary artery  disease.  His father is living at 1 and he is healthy, but is an alcoholic  and he has an uncle who had an MI at age 9.   SOCIAL HISTORY:  Mr. Laurel is a Native American.  He is married and has three  children.  He lives with his wife and youngest child.  He is a Garment/textile technologist.  He smoked two packs per day of cigarettes for eight years, but quit  in 1994.  He does not use  alcohol or illicit drugs.   REVIEW OF SYSTEMS:  CONSTITUTIONAL:  No fever.  No chills.  No weight loss.  CARDIOVASCULAR:  Chronic chest pain every morning as described in HPI.  PULMONARY:  Progressive shortness of breath x6 months with walking about a  quarter mile.  No cough.  No wheeze.  GI:  No abdominal pain.  No nausea,  vomiting, diarrhea, or constipation.  No blood in the stool.  GU:  No  difficulty with urination.  SKIN:  No rashes.  HEENT:  Patient does report  having daily headaches for about three years and patient does snore at  night.  Wife states that he has some apnea at night.  Sleep is not restful.   PRIMARY CARE PHYSICIAN:  Luretha Murphy, M.D. at [facility].  Last seen there in April of 2003.   PHYSICAL EXAMINATION:  VITAL SIGNS:  Temperature 97.3, blood pressure  121/82, heart rate 74, respiratory rate 22.  GENERAL APPEARANCE:  Alert and oriented x3, in no acute distress.  HEENT:  Eyes:  Pupils are equally round and reactive to light and  accommodation.  Extraocular muscles intact.  No scleral injection or  icterus.  Mouth and throat:  Small oropharynx.  Otherwise, mucous membranes  moist.  CHEST:  Slightly tender to palpation over left breast.  LUNGS:  Clear to auscultation bilaterally.  HEART:  Regular rate and rhythm.  No murmurs, rubs, or gallops.  ABDOMEN:  Soft, nontender, nondistended, positive bowel sounds, obese.  EXTREMITIES:  He had a positive impingement test over left rotator cuff.  Otherwise, no edema.  Pulses were 2+ bilaterally.  NEUROLOGIC:  Cranial nerves II-XII are intact.  DTRs 2+ bilaterally.  SKIN:  No rashes and no adenopathy noted.   LABORATORIES AND TESTS:  Sodium 137, potassium 3.8, chloride 108, BUN 12,  creatinine 0.9, blood glucose 96.  H&H is 15/44.  First set of  cardiac  enzymes was negative.  EKG:  Normal sinus rhythm with early repolarization. No ST elevation or Q-waves.  No change compared to the October 13  EKG.  Chest x-ray was no acute process.   ASSESSMENT/PLAN:  This is a 45 year old male with chest pain and history of  hypertension and dyslipidemia.  1.  Chest pain.  Mr. Tailor presents with atypical chest pain that he      describes as sharp and intermittent.  We feel this is unlikely to be      cardiac in origin but given his risk factors of obesity, hypertension,      dyslipidemia, and male gender we will admit him and check cardiac      enzymes x3 and an EKG in the a.m. to rule out myocardial infarction.  He      has been admitted twice before for chest pain including one admission in      2003 during which he underwent an exercise stress test which was      negative, although we do not have the report.  At his admission on      May 17, 2005 his team recommended an outpatient cardiac work-up so      we will call cardiology to initiate this during his hospitalization.  We      will continue his home medications for hypertension and dyslipidemia and      start nitroglycerin and morphine p.r.n. for pain.  He has a history of      gastroesophageal reflux disease and this is on the differential for      chest pain.  We will start Protonix 40 mg p.o. b.i.d.  Musculoskeletal      is also in the differential due to his complaints of shoulder pain,      positive impingement test, and tenderness to palpation over the left      chest.  A TSH was checked 10 days ago in the hospital and was within      normal limits.  2.  Hypertension.  Mr. Newhall's hypertension is well controlled.  Continue      home regimen of HCTZ 12.5 mg p.o.      daily and metoprolol 50 mg p.o. b.i.d.  3.  Dyslipidemia.  Start Niaspan 500 mg p.o. q.h.s.  4.  Probable sleep apnea.  Will try to find results of sleep study that Mr.      Sherrer had completed in __________.     ______________________________  Levander Campion    ______________________________  Kerby Nora, MD    JH/MEDQ  D:  05/29/2005  T:  05/29/2005   Job:  [id number]

## 2010-12-21 NOTE — Cardiovascular Report (Signed)
NAMEJAMAURION, SLEMMER                  ACCOUNT NO.:  [id number]   MEDICAL RECORD NO.:  [id number]          PATIENT TYPE:  INP   LOCATION:  3729                         FACILITY:  [facility]   PHYSICIAN:  Thereasa Solo. Little, M.D. DATE OF BIRTH:  Feb 23, 1966   DATE OF PROCEDURE:  05/31/2005  DATE OF DISCHARGE:  05/31/2005                              CARDIAC CATHETERIZATION   This 45 year old male has had several admissions for chest pain. He was  readmitted with chest pain and cardiac enzymes were negative. EKG shows  nonspecific changes. He is brought to the cath lab for definitive evaluation  of his coronary anatomy.   After obtaining informed consent, the patient was prepped and draped in the  usual sterile fashion exposing the right groin. Applying local anesthesia 1%  Xylocaine, the Seldinger technique was employed and a 5-French introducer  sheath was placed in the right femoral artery. Left and right coronary  arteriography and ventriculography in the RAO projection was performed.   COMPLICATIONS:  None.   EQUIPMENT:  5-French Judkins configuration catheters.   TOTAL CONTRAST USED:  75 cc.   RESULTS:  1.  Hemodynamic monitoring central aortic pressure 121/77. Left ventricular      pressure 118/18. No significant gradient noted at time of pullback.  2.  Ventriculography:  Ventriculography in the RAO projection revealed      normal LV systolic function. The ejection fraction was in excess of 60%.      There was PVCs and short run of nonsustained ventricular tachycardia      during the ventriculogram. Left ventricular end-diastolic pressure was      24.   CORONARY ARTERIOGRAPHY:  On fluoroscopy, no calcification was appreciated.   1.  Left main normal and bifurcated.  2.  LAD:  The LAD extended down to the apex of the heart, gave rise to one      large diagonal branch. This system was free of disease.  3.  Circumflex:  The circumflex basically gave rise to one large bifurcating  OM that was free of disease.  4.  Right coronary artery:  The right coronary artery was a large dominant      vessel with a PDA and two posterior lateral vessels. There was no      evidence of coronary artery disease. This vessel was free of disease.   CONCLUSION:  1.  Normal left ventricular systolic function.  2.  No evidence of coronary artery disease.   Based on his catheter data, I cannot explain his chest pain. I see no reason  he cannot be discharged home later today.           ______________________________  Thereasa Solo. Little, M.D.     ABL/MEDQ  D:  05/31/2005  T:  05/31/2005  Job:  [id number]   cc:   Leighton Roach McDiarmid, M.D.  Fax: [phone]   Catheter Lab

## 2010-12-21 NOTE — Discharge Summary (Signed)
[name], [name]                  ACCOUNT NO.:  [id number]   MEDICAL RECORD NO.:  [id number]          PATIENT TYPE:  INP   LOCATION:  3729                         FACILITY:  [facility]   PHYSICIAN:  Leighton Roach McDiarmid, M.D.DATE OF BIRTH:  Aug 26, 1965   DATE OF ADMISSION:  05/29/2005  DATE OF DISCHARGE:  05/31/2005                                 DISCHARGE SUMMARY   ADMISSION DIAGNOSES:  1.  Chest pain.  2.  Hypertension.  3.  Dyslipidemia.  4.  Probable sleep apnea.   DISCHARGE MEDICATIONS:  1.  Niaspan 500 mg 1 tablet by mouth q.h.s.  2.  Metoprolol 50 mg p.o. b.i.d.  3.  HCTZ 12.5 mg 1 p.o. daily.  4.  Aspirin 81 mg 1 p.o. daily.  5.  Nitroglycerin 0.4 mg 1 SL q. 5 minutes x3 for chest pain.  6.  Darvocet N-100, 1-2 tabs p.o. q.4-6 hours p.r.n. pain.   CONSULTATIONS:  [facility] was consulted to evaluate the  patient's chest pain.   HISTORY OF PRESENT ILLNESS:  See dictated H&P for details. But briefly, this  is a 45 year old male who was hospitalized in 2003 for atypical chest pain  as well as attended previously on May 17, 2005 to May 18, 2005 for  atypical chest pain. Both times, myocardial infarction was ruled out. He  presented to the ED at [facility] with left sided, sharp 7/10 chest  pain that was intermittent. This has been going on for approximately 6  months. The pain is worse in the morning. The pain is not associated with  exertion but is associated with left arm weakness. The pain radiates to the  patient's left jaw, left neck, and down the left arm. Occasionally, the pain  is associated with shortness of breath and sweating. The patient does not  feel that the pain is related to eating or position. The patient took  nitroglycerin, which possibly helped the pain. The patient came into the  emergency department today because his pain was worse than it has been for  the past 6 months.   HOSPITAL COURSE:  Problem 1. CHEST PAIN:  The  patient presented with  atypical chest pain that we felt was not likely to be cardiac in origin but  because he has been admitted twice, once in 2003 and then once 10 days ago  for the same chest pain, it has been increasing in frequency and severity,  we decided to admit him and check cardiac enzymes x3 and an EKG to rule out  myocardial infarction. He does have several risk factors for heart disease  including obesity, hypertension, dyslipidemia, and male gender. After his  last admission, he was recommended to undergo an exercise stress test as an  outpatient but was not able to participate in that test before this  admisison. He did have a negative exercise stress test in 2003, although we  do not have the report. Also on our differential for his chest pain was  GERD. He has a history of this and we started him on Protonix 40 mg p.o.  b.i.d. Musculoskeletal cause is also on the differential due to his  complaints of shoulder pain and a positive rotator cuff impingement test and  tenderness to palpation over the left chest. A TSH was checked at his  previous hospitalization in October and was within normal limits. Over the  course of the hospitalization, the patient had negative cardiac enzymes x3.  Electrolytes were within normal limits and an EKG showed no change from the  EKG in 2003 and also the EKG as of May 17, 2005 admission. Cardiology  was consulted due to his risk factors and the increasing and severity and  frequency of his chest pain and the recommendation that he undergo  outpatient stress test. When cardiology reviewed his EKG, they were  concerned about first degree AV block and the concerning nature of his pain  for unstable angina. The patient underwent cardiac catheterization on the  morning of May 31, 2005 and this showed no coronary artery disease.  Cardiology also recommended that we rule the patient out for pulmonary  embolus and so we obtained, to rule that  out as the cause of his chest pain,  we obtained a D-dimer which was within normal limits at 0.34. Post  catheterization, the patient noticed that his right leg, which was the side  of the catheterization felt slightly numb and was decreased temperature. His  nurse had trouble locating his pulse in that foot and use the Doppler and  was able to find the pulse. Cardiology was called and arterial Dopplers were  ordered and they were within normal limits. Cardiology cleared the patient  to be discharged that afternoon. Due to negative catheterization, his chest  pain is likely to be related to his gastroesophageal reflux disease or  musculoskeletal causes and he will follow up with Dr. Rosalio Macadamia at the  [facility] on June 03, 2005.   Problem 2. HYPERTENSION:  The patient has been well controlled on his home  regimen of HCTZ 12.5 mg p.o. daily and metoprolol 50 mg p.o. b.i.d. We  continued these medications during his hospitalization and he remained well  controlled. His HCTZ was held on the morning of his cardiac catheterization.  We restarted both medicines prior to his discharge.   Problem 3. DYSLIPIDEMIA:  The patient was started on Niaspan at his last  hospitalization on May 16, 2005 and the patient had not obtained that  prescription yet and we restarted this medicine during his hospitalization.   Problem 4. POSSIBLE SLEEP APNEA:  The patient reported a history of snoring  and possible apneic episodes reported by his wife during sleep. Given his  obesity and small oropharynx we suspected possible sleep apnea. The patient  reported having a sleep study done at [facility] in 2003  and we obtained the results of this, which showed no need for CPAP at that  time. The patient will follow up with Dr. Rosalio Macadamia regarding this issue.   CONDITION ON DISCHARGE:  Good.   ACTIVITY:  The patient was instructed not to lift anything over 10  pounds for 2 weeks after catheterization and not to drive for 3 days.   DIET:  He was instructed to follow a heart healthy diet.   FOLLOWUP:  The patient is to follow up with Rosalio Macadamia at the [facility] on Monday, June 03, 2005 at 10:45 a.m. The phone number  there was given to him, which is [phone].  Kerby Nora, MD    ______________________________  Etta Grandchild, M.D.    AB/MEDQ  D:  06/02/2005  T:  06/02/2005  Job:  [id number]   cc:   Dr. Martina Sinner  [facility]

## 2010-12-21 NOTE — Discharge Summary (Signed)
[name], [name]                  ACCOUNT NO.:  [id number]   MEDICAL RECORD NO.:  [id number]          PATIENT TYPE:  INP   LOCATION:  2025                         FACILITY:  [facility]   PHYSICIAN:  Franchot Mimes, MD      DATE OF BIRTH:  [birth year]   DATE OF ADMISSION:  05/17/2005  DATE OF DISCHARGE:  05/18/2005                                 DISCHARGE SUMMARY   DISCHARGE DIAGNOSES:  1.  Chest pain, not acute myocardial infarction.  2.  Hypertension.  3.  Dyslipidemia with low HDL.   DISCHARGE MEDICATIONS:  1.  Aspirin 81 mg p.o. daily.  2.  Metoprolol 50 mg p.o. twice a day.  3.  HCTZ 12.5 mg daily.  4.  Niacin 250 daily x7 days, then twice a day  5.  Nitroglycerin 0.4 mg sublingual as needed for chest pain.   DISCHARGE INSTRUCTIONS:  1.  The patient was instructed to contact [facility] for an      appointment within the next two weeks for outpatient workup.  2.  He was also instructed if he continued to experience chest pain to take      his nitroglycerin, and if his pain persisted, then to contact [facility] or the emergency department   CONSULTANT:  None.   PROCEDURES:  1.  A chest x-ray was normal.  2.  An EKG was also normal with mild first-degree AV block.  3.  A 2-D echocardiogram on May 17, 2005.  Results:  The LVEF is grossly      normal but technically difficult study.  Left ventricular wall thickness      is moderately increased.  Right ventricular is also mildly dilated.   LABORATORY DATA:  His CBC is within normal limits. BUN is within normal  limits. AST and ALT are mildly elevated at 38 and 49. Cardiac enzymes are  negative x3. His TSH is 0.564.  BNP is less than 30, and a D-dimer is  negative.   HISTORY OF PRESENT ILLNESS:  The patient is a 45 year old male who was  admitted with subacute onset of chest pain that was acutely worse that  morning and was worse with exertion. He had been having some mild chest pain  and shortness of breath over the last six months, but this was the worst it  had ever been.  He was also having symptoms consistent with PND but no  orthopnea or lower extremity swelling. He does have a family history of  heart disease in his mother. He quit smoking 12 years ago. He denies alcohol  or other drug use.   HOSPITAL COURSE:  Problem 1.  Chest pain. The patient was ruled out with  cardiac enzymes. He was on nitroglycerin transdermal every six hours, and  this helped keep his blood pressure and chest pain controlled. Given that  his EKG, echocardiogram, and cardiac enzymes are normal, and there does not  appear to be another etiology for his chest pain, the patient was  effectively ruled out for  MI and deemed okay for discharge with outpatient  workup.   Problem 2.  Hypertension. His blood pressure does seem to be mildly  elevated, and he will be discharged with HCTZ and metoprolol. This will need  to be followed up as an outpatient.   Problem 3.  Dyslipidemia. His HDL was 27 on a lipid profile, total  cholesterol 163, and LDL 65. Thus, he was started on niacin at discharge for  his HDL.   SUGGESTIVE FOLLOW-UP ITEMS:  1.  The patient should be considered for outpatient coronary artery disease      workup with either a stress test or catheterization.  2.  Given his habitus and symptoms, the patient should also be considered      for a split phase sleep study for obstructive sleep apnea.  3.  The patient's niacin should be titrated for his HDL.  4.  The patient will also need continued monitoring of his blood pressure as      well as diet and exercise counseling.      Franchot Mimes, MD     TV/MEDQ  D:  05/18/2005  T:  05/18/2005  Job:  [id number]   cc:   [facility]  Attention:  Luretha Murphy

## 2011-01-02 ENCOUNTER — Ambulatory Visit (INDEPENDENT_AMBULATORY_CARE_PROVIDER_SITE_OTHER): Payer: 59 | Admitting: Family Medicine

## 2011-01-02 ENCOUNTER — Encounter: Payer: Self-pay | Admitting: Family Medicine

## 2011-01-02 VITALS — BP 127/84 | HR 59

## 2011-01-02 DIAGNOSIS — M25519 Pain in unspecified shoulder: Secondary | ICD-10-CM

## 2011-01-02 DIAGNOSIS — G8929 Other chronic pain: Secondary | ICD-10-CM

## 2011-01-02 NOTE — Progress Notes (Signed)
  Subjective:    Patient ID: [name], male    DOB: 1965-11-30, 45 y.o.   MRN: [id number]  HPI 45 year old male followup neck and left shoulder pain. He has been to at least 8 visits of physical therapy, and states at this point his pain is 90% improved. He would like a note today stating he can go back to work.   Review of Systems Denies fever, sweats, chills, weight loss    Objective:   Physical Exam General appearance: Obese male slightly unkempt no distress Neck: Full range of motion negative Spurling's Bilateral shoulders: Full range of motion, negative impingement signs, negative O'Brien's, rotator cuff strength grossly intact. Neuro: He has normal strength with testing of the deltoid, biceps, triceps, rotator cuff, hands, wrists, and fingers.       Assessment & Plan:  Left shoulder neck and back pain, now grade improved after therapy. 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

## 2011-02-10 ENCOUNTER — Emergency Department (HOSPITAL_COMMUNITY)
Admission: EM | Admit: 2011-02-10 | Discharge: 2011-02-11 | Disposition: A | Discharge status: Home / Self Care | Payer: 59 | Attending: Emergency Medicine | Admitting: Emergency Medicine

## 2011-02-10 DIAGNOSIS — L559 Sunburn, unspecified: Secondary | ICD-10-CM | POA: Insufficient documentation

## 2011-02-10 DIAGNOSIS — I10 Essential (primary) hypertension: Secondary | ICD-10-CM | POA: Insufficient documentation

## 2011-02-10 DIAGNOSIS — W899XXA Exposure to unspecified man-made visible and ultraviolet light, initial encounter: Secondary | ICD-10-CM | POA: Insufficient documentation

## 2011-03-05 ENCOUNTER — Ambulatory Visit (INDEPENDENT_AMBULATORY_CARE_PROVIDER_SITE_OTHER): Payer: 59 | Admitting: Family Medicine

## 2011-03-05 VITALS — BP 147/87 | HR 51 | Temp 98.3°F | Ht 74.0 in | Wt 320.0 lb

## 2011-03-05 DIAGNOSIS — S91339A Puncture wound without foreign body, unspecified foot, initial encounter: Secondary | ICD-10-CM | POA: Insufficient documentation

## 2011-03-05 DIAGNOSIS — S91309A Unspecified open wound, unspecified foot, initial encounter: Secondary | ICD-10-CM

## 2011-03-05 DIAGNOSIS — Z23 Encounter for immunization: Secondary | ICD-10-CM

## 2011-03-05 NOTE — Assessment & Plan Note (Signed)
Last tetanus in EPIC listed as given in 2005, patient unsure of tetanus status.  Will give tetanus today.  No signs of infection at this time, instructed to keep clean.  Given red flags to look for at home.

## 2011-03-05 NOTE — Progress Notes (Signed)
  Subjective:    Patient ID: [name], male    DOB: 12-Nov-1965, 45 y.o.   MRN: [id number]  HPI 1.  Puncture wound:  Patient reports that he stepped on nail yesterday while moving some things outside.  He was wearing shoes at that time and puncture was in the front of his foot.  Afterwards he did clean the area well with soap and water.  He does report some tenderness over the area since the time of the puncture.  He has not noticed any fever/chills, drainage, swelling, discoloration around the area.     Review of Systems Per HPI    Objective:   Physical Exam  Musculoskeletal:       Left foot: He exhibits tenderness.       Small, barely visible puncture wound on plantar aspect of forefoot.  Area mildly tender, without swelling, drainage, crepitus.  Pulses in foot 2+          Assessment & Plan:

## 2011-04-03 ENCOUNTER — Ambulatory Visit (INDEPENDENT_AMBULATORY_CARE_PROVIDER_SITE_OTHER): Payer: 59 | Admitting: Family Medicine

## 2011-04-03 ENCOUNTER — Encounter: Payer: Self-pay | Admitting: Family Medicine

## 2011-04-03 VITALS — BP 138/86 | HR 54 | Temp 97.3°F | Ht 74.0 in | Wt 317.3 lb

## 2011-04-03 DIAGNOSIS — G4730 Sleep apnea, unspecified: Secondary | ICD-10-CM

## 2011-04-03 DIAGNOSIS — I10 Essential (primary) hypertension: Secondary | ICD-10-CM

## 2011-04-03 LAB — BASIC METABOLIC PANEL
BUN: 14 mg/dL (ref 6–23)
CO2: 29 mEq/L (ref 19–32)
Calcium: 9.8 mg/dL (ref 8.4–10.5)
Glucose, Bld: 83 mg/dL (ref 70–99)
Sodium: 141 mEq/L (ref 135–145)

## 2011-04-03 MED ORDER — LISINOPRIL 20 MG PO TABS: 20.0000 mg | ORAL_TABLET | Freq: Every day | ORAL | Status: DC

## 2011-04-03 NOTE — Progress Notes (Signed)
  Subjective:    Patient ID: [name], male    DOB: 04/05/1966, 45 y.o.   MRN: [id number]  HPI  Pt complains of a drowsy, heavy eyed feeling at 9 am each morning.  He states that he wakes up very tired but goes to work and is ok until his 9am break.  Then he will eat a snack and feels very tired for about an hour.  He describes it as dizzy, but when explaining it, he has trouble staying awake, has to lean against something to support him because he is closing his eyes.  He denies black spots, room spinning or a funny feeling in his ears.  He will start to feel better around 10 and then get a little tired again at 2 pm.  He states that he sleeps from 11-5 each night, he does not wake often during the night.  He uses his CPAP every night.    Review of Systems No HA, CP, SOB or tinnitus     Objective:   Physical Exam  Vital signs reviewed General appearance - alert, well appearing, and in no distress and oriented to person, place, and time Heart - normal rate, regular rhythm, normal S1, S2, no murmurs, rubs, clicks or gallops Chest - clear to auscultation, no wheezes, rales or rhonchi, symmetric air entry, no tachypnea, retractions or cyanosis Neurological - alert, oriented, normal speech, no focal findings or movement disorder noted,normal gait, neck supple without rigidity, cranial nerves II through XII intact       Assessment & Plan:  APNEA, SLEEP pts symptoms point to fatigue rather than another etiology.  Will have pt reevaluated for his CPAP as his setting have not changed in over 6 years per pt.  Pt states he does not nod off while working or while driving so safe to work for now.  Advised pt that he will need to take himself out of work if he is in danger of falling asleep.  HYPERTENSION, BENIGN SYSTEMIC Pt with bradycardia and no strong indication for BB.  Will switch to lisinopril.  Will check BMET today and if all normal call pt to tell him to start.  See back in one week for  recheck BP and recheck BMET.

## 2011-04-03 NOTE — Assessment & Plan Note (Signed)
pts symptoms point to fatigue rather than another etiology.  Will have pt reevaluated for his CPAP as his setting have not changed in over 6 years per pt.  Pt states he does not nod off while working or while driving so safe to work for now.  Advised pt that he will need to take himself out of work if he is in danger of falling asleep.

## 2011-04-03 NOTE — Assessment & Plan Note (Signed)
Pt with bradycardia and no strong indication for BB.  Will switch to lisinopril.  Will check BMET today and if all normal call pt to tell him to start.  See back in one week for recheck BP and recheck BMET.

## 2011-04-03 NOTE — Patient Instructions (Addendum)
We are going to send you for a sleep study Stop taking the metoprolol Start taking lisinopril after I call you with the blood work Please make a nurse visit in one week to recheck blood pressure and recheck blood work

## 2011-04-05 ENCOUNTER — Telehealth: Payer: Self-pay | Admitting: Family Medicine

## 2011-04-05 NOTE — Progress Notes (Signed)
Addended by: Jone Baseman D on: 04/05/2011 04:44 PM   Modules accepted: Orders

## 2011-04-05 NOTE — Telephone Encounter (Signed)
Please call [name] about his lab results.

## 2011-04-05 NOTE — Telephone Encounter (Signed)
Called pt and informed of labs are WNL .[name]

## 2011-04-15 ENCOUNTER — Ambulatory Visit (INDEPENDENT_AMBULATORY_CARE_PROVIDER_SITE_OTHER): Payer: 59 | Admitting: *Deleted

## 2011-04-15 ENCOUNTER — Other Ambulatory Visit: Payer: 59

## 2011-04-15 VITALS — BP 130/94 | HR 64

## 2011-04-15 DIAGNOSIS — I10 Essential (primary) hypertension: Secondary | ICD-10-CM

## 2011-04-15 LAB — BASIC METABOLIC PANEL
CO2: 28 mEq/L (ref 19–32)
Calcium: 9.7 mg/dL (ref 8.4–10.5)
Creat: 0.9 mg/dL (ref 0.50–1.35)
Glucose, Bld: 104 mg/dL — ABNORMAL HIGH (ref 70–99)
Sodium: 139 mEq/L (ref 135–145)

## 2011-04-15 NOTE — Progress Notes (Signed)
Will forward message to Luretha Murphy.

## 2011-04-15 NOTE — Progress Notes (Signed)
Patient in for BP check. Started lisinopril aA week ago. BP checked manually using large adult cuff. BP LA 130/94 and RA 130/88 pulse 64. Will have labs done today. Will forward message to Dr. Hulen Luster. Phone # to call back [phone].

## 2011-04-15 NOTE — Progress Notes (Signed)
BMP DONE TODAY Brenton Joines 

## 2011-04-16 ENCOUNTER — Telehealth: Payer: Self-pay | Admitting: Family Medicine

## 2011-04-16 NOTE — Telephone Encounter (Signed)
Mr. Pullin called back to return your call to him.  Please call him asap

## 2011-04-16 NOTE — Telephone Encounter (Signed)
Message regarding BP was sent to [name] yesterday and she advises BP  satisfactory and to plan on follow up. Advised patient to follow up with MD in 1 month . continue current meds.

## 2011-04-29 ENCOUNTER — Ambulatory Visit (HOSPITAL_BASED_OUTPATIENT_CLINIC_OR_DEPARTMENT_OTHER): Payer: 59 | Attending: Family Medicine

## 2011-04-29 DIAGNOSIS — G4730 Sleep apnea, unspecified: Secondary | ICD-10-CM

## 2011-04-29 DIAGNOSIS — G4733 Obstructive sleep apnea (adult) (pediatric): Secondary | ICD-10-CM | POA: Insufficient documentation

## 2011-05-01 DIAGNOSIS — R0609 Other forms of dyspnea: Secondary | ICD-10-CM

## 2011-05-01 DIAGNOSIS — G4733 Obstructive sleep apnea (adult) (pediatric): Secondary | ICD-10-CM

## 2011-05-01 DIAGNOSIS — R0989 Other specified symptoms and signs involving the circulatory and respiratory systems: Secondary | ICD-10-CM

## 2011-05-01 NOTE — Procedures (Signed)
[name], [name]                  ACCOUNT NO.:  [id number]  MEDICAL RECORD NO.:  [id number]          PATIENT TYPE:  OUT  LOCATION:  SLEEP CENTER                 FACILITY:  [facility]  PHYSICIAN:  Loletha Bertini D. Maple Hudson, MD, FCCP, FACPDATE OF BIRTH:  10[birth year]08-1965  DATE OF STUDY:  04/29/2011                           NOCTURNAL POLYSOMNOGRAM  REFERRING PHYSICIAN:  Ellery Plunk, MD  INDICATION FOR STUDY:  Hypersomnia with sleep apnea.  EPWORTH SLEEPINESS SCORE:  Epworth sleepiness score 10/24, BMI 40.2, weight 313 pounds, height 74 inches, neck 19 inches.  MEDICATIONS:  Home medications are charted and reviewed.  SLEEP ARCHITECTURE:  Split study protocol.  During the diagnostic phase, total sleep time 121 minutes with sleep efficiency 92.4%.  Stage I was 5.4%, stage II 88.4%, stage III absent, REM 6.2% of total sleep time. Sleep latency 6 minutes, REM latency 109.5 minutes, awake after sleep onset 4 minutes, arousal index 13.9.  BEDTIME MEDICATION:  None.  RESPIRATORY DATA:  Split study protocol.  Apnea/hypopnea index (AHI) 18.3 per hour.  A total of 37 events were scored including one obstructive apnea and 36 hypopneas.  Events were seen in all sleep positions.  REM AHI 48 per hour.  CPAP is titrated to 14 CWP, AHI zero per hour.  He wore a medium ResMed Mirage Quattro full-face mask with heated humidifier.  OXYGEN DATA:  Moderately loud snoring before CPAP with oxygen desaturation to a nadir of 85% on room air.  With CPAP titration, snoring was prevented and mean oxygen saturation improved to 94.5% on room air.  CARDIAC DATA:  Normal sinus rhythm.  MOVEMENT-PARASOMNIA:  No significant movement disturbance.  Bathroom x1.  IMPRESSIONS-RECOMMENDATIONS: 1. Moderate obstructive sleep apnea/hypopnea syndrome, AHI 18.3 per     hour.  Moderately loud snoring with oxygen desaturation to a nadir     of 85% on room air. 2. Successful CPAP titration to 14 CWP, AHI zero per hour.  He wore a  medium ResMed Mirage Quattro full-face mask with heated humidifier.     Demont Linford D. Maple Hudson, MD, [facility], FACP Diplomate, Biomedical engineer of Sleep Medicine Electronically Signed    CDY/MEDQ  D:  05/01/2011 10:35:02  T:  05/01/2011 10:54:21  Job:  [id number]

## 2011-05-17 ENCOUNTER — Encounter: Payer: Self-pay | Admitting: Family Medicine

## 2011-05-17 DIAGNOSIS — G4733 Obstructive sleep apnea (adult) (pediatric): Secondary | ICD-10-CM

## 2011-05-17 HISTORY — DX: Obstructive sleep apnea (adult) (pediatric): G47.33

## 2011-06-05 ENCOUNTER — Ambulatory Visit: Payer: 59 | Admitting: Family Medicine

## 2011-06-17 ENCOUNTER — Ambulatory Visit (INDEPENDENT_AMBULATORY_CARE_PROVIDER_SITE_OTHER): Payer: 59 | Admitting: Family Medicine

## 2011-06-17 ENCOUNTER — Encounter: Payer: Self-pay | Admitting: Family Medicine

## 2011-06-17 DIAGNOSIS — M25559 Pain in unspecified hip: Secondary | ICD-10-CM

## 2011-06-17 DIAGNOSIS — G43909 Migraine, unspecified, not intractable, without status migrainosus: Secondary | ICD-10-CM

## 2011-06-17 DIAGNOSIS — M25552 Pain in left hip: Secondary | ICD-10-CM | POA: Insufficient documentation

## 2011-06-17 DIAGNOSIS — I10 Essential (primary) hypertension: Secondary | ICD-10-CM

## 2011-06-17 MED ORDER — SUMATRIPTAN SUCCINATE 50 MG PO TABS: 50.0000 mg | ORAL_TABLET | ORAL | Status: DC | PRN

## 2011-06-17 NOTE — Progress Notes (Signed)
Left side pain starting last week. Was [facility] by [facility] and given a steroid shot and meloxicam. He continues to meloxicam and does note some pain on his left side. He points to the lateral of the crest. He notes that walking does cause some pain, but he feels like overall he is getting better.    Hypertension: Taking medications as listed below. No chest pain dyspnea palpitations syncope or edema.  PMH reviewed.  ROS as above otherwise neg Medications reviewed. Current Outpatient Prescriptions  Medication Sig Dispense Refill  . allopurinol (ZYLOPRIM) 300 MG tablet Take 300 mg by mouth daily.       Marland Kitchen aspirin 81 MG chewable tablet Chew 81 mg by mouth daily.       Marland Kitchen lisinopril (PRINIVIL,ZESTRIL) 20 MG tablet Take 1 tablet (20 mg total) by mouth daily.  30 tablet  3  . meloxicam (MOBIC) 15 MG tablet Take 15 mg by mouth daily.        . Omega-3 Fatty Acids (FISH OIL) 1000 MG CAPS No directions given       . omeprazole (PRILOSEC) 20 MG capsule Take 1 capsule (20 mg total) by mouth 2 (two) times daily.  60 capsule  11  . colchicine 0.6 MG tablet Take 0.6 mg by mouth 2 (two) times daily. Take every hour times 4 tonight, then twice daily      . SUMAtriptan (IMITREX) 50 MG tablet Take 1 tablet (50 mg total) by mouth every 2 (two) hours as needed for migraine.  10 tablet  1  . DISCONTD: SUMAtriptan (IMITREX) 50 MG tablet Take 50 mg by mouth. with onset HA; you may repeat in 2 hours if the headache persists. DISP Quant sufficient 1 month         Exam:  BP 128/80  Pulse 58  Ht 6\' 2"  (1.88 m)  Wt 315 lb (142.883 kg)  BMI 40.44 kg/m2 Gen: Well NAD HEENT: EOMI,  MMM Lungs: CTABL Nl WOB Heart: RRR no MRG Abd: NABS, NT, ND Exts: Non edematous BL  LE, warm and well perfused.  MSK:  Obese.  Left lateral iliac crest mildly tender to palpation. Hip abductors are 4/5 week.  Gait is normal. No trochanteric bursitis tenderness to palpation.  Normal otherwise. Feet: Pes planus and pronation bilaterally of  midfoot.

## 2011-06-17 NOTE — Assessment & Plan Note (Signed)
Doing well with lisinopril. Plan to continue current regimen.

## 2011-06-17 NOTE — Assessment & Plan Note (Signed)
Refilled sumatriptan 

## 2011-06-17 NOTE — Assessment & Plan Note (Signed)
Pain along the site of insertion of the left hip abductors, along the iliac crest. Plan to continue meloxicam as needed with the addition of Tylenol. Should patient some hip physical therapy exercises that he can do at home.  Will followup if not improving

## 2011-06-17 NOTE — Patient Instructions (Signed)
Thank you for coming in today. Call your home health agency and the sleep study place.  Come back in 3 months or sooner.  Do those stepups and side step ups.  Try ice with a bag of frozen peas 15 mins 2x a day.  I refilled the immitrex.  If you do have immatrex and you feel a headache coming you can take 8 baby aspirins or 2-3 full dose aspirin instead.

## 2011-06-18 ENCOUNTER — Telehealth: Payer: Self-pay | Admitting: Family Medicine

## 2011-06-18 NOTE — Telephone Encounter (Signed)
[name] is needing to know where it was that he was sent for the Sleep Study back in August or September.

## 2011-06-19 ENCOUNTER — Telehealth: Payer: Self-pay | Admitting: Family Medicine

## 2011-06-19 NOTE — Telephone Encounter (Signed)
Pt asking to have his sleep study info sent to [facility] so he can get his CPAP

## 2011-06-19 NOTE — Telephone Encounter (Signed)
Advised pt Dr Denyse Amass would like pt to call [facility] to get his CPAP straightened out. # given  To pt. Pt agreed.

## 2011-06-19 NOTE — Telephone Encounter (Signed)
Faxed sleep study to [phone].Busick, Rodena Medin

## 2011-08-30 ENCOUNTER — Other Ambulatory Visit: Payer: Self-pay | Admitting: Family Medicine

## 2011-08-31 NOTE — Telephone Encounter (Signed)
Refill request

## 2011-11-25 ENCOUNTER — Encounter: Payer: Self-pay | Admitting: Family Medicine

## 2011-11-25 ENCOUNTER — Ambulatory Visit (INDEPENDENT_AMBULATORY_CARE_PROVIDER_SITE_OTHER): Payer: 59 | Admitting: Family Medicine

## 2011-11-25 ENCOUNTER — Other Ambulatory Visit: Payer: Self-pay | Admitting: Family Medicine

## 2011-11-25 VITALS — BP 148/99 | HR 61 | Ht 74.0 in | Wt 310.0 lb

## 2011-11-25 DIAGNOSIS — R400 Somnolence: Secondary | ICD-10-CM

## 2011-11-25 DIAGNOSIS — R404 Transient alteration of awareness: Secondary | ICD-10-CM

## 2011-11-25 DIAGNOSIS — I10 Essential (primary) hypertension: Secondary | ICD-10-CM

## 2011-11-25 NOTE — Patient Instructions (Signed)
I think the cause of your daytime sleepiness is lack of sleep at nighttime.  Look at the sleep hygiene handout.

## 2011-11-25 NOTE — Assessment & Plan Note (Signed)
Most likely pt's drowsiness after lunch is 2/2 sleep deprivation.  Most likely needs more than 5-6 hours per night. Pt to continue to use CPAP machine.  Gave sleep hygiene handout.  Pt to read and try out these tactics to see if they help him with his problems of falling asleep at bedtime. Pt f/up regarding this issue with PCP in the next 2-3 months or sooner if new or worsening of symptoms.

## 2011-11-25 NOTE — Progress Notes (Signed)
  Subjective:    Patient ID: [name], male    DOB: 04-Jan-1966, 46 y.o.   MRN: [id number]  HPI Dizziness: Right after eating, gets very drowsy and sleepy, feels weak all over, "like my body is relaxed or something".  Has been going on x 3 years.  Usually this happens after lunch.  But usually doesn't happen at breakfast or dinner.  No SOB.  No chest pain.  Uses CPAP machine everynight.  Even if he takes a nap in the daytime uses his cpap.  Sleeping about 5-6 hours at night.   Difficulty falling asleep.  Watches TV as falls asleep.  Occasionally will read in bed.  Will occasionally nap in the day. No fever. No headache.  No problems with vision.  No bowel or bladder problems.    Review of Systems As per above.     Objective:   Physical Exam  Constitutional: He appears well-developed and well-nourished.       obese  HENT:  Head: Normocephalic and atraumatic.  Cardiovascular: Normal rate, regular rhythm and normal heart sounds.   No murmur heard. Pulmonary/Chest: Effort normal. No respiratory distress. He has no wheezes. He has no rales.  Musculoskeletal: He exhibits edema (trace).  Neurological: He is alert. He displays normal reflexes. No cranial nerve deficit. He exhibits normal muscle tone. Coordination normal.  Skin: No rash noted.  Psychiatric: He has a normal mood and affect.          Assessment & Plan:

## 2011-12-02 ENCOUNTER — Ambulatory Visit (INDEPENDENT_AMBULATORY_CARE_PROVIDER_SITE_OTHER): Payer: 59 | Admitting: Family Medicine

## 2011-12-02 ENCOUNTER — Encounter: Payer: Self-pay | Admitting: Family Medicine

## 2011-12-02 VITALS — BP 130/80 | HR 64 | Ht 74.0 in | Wt 310.8 lb

## 2011-12-02 DIAGNOSIS — E669 Obesity, unspecified: Secondary | ICD-10-CM

## 2011-12-02 DIAGNOSIS — I10 Essential (primary) hypertension: Secondary | ICD-10-CM

## 2011-12-02 DIAGNOSIS — E781 Pure hyperglyceridemia: Secondary | ICD-10-CM

## 2011-12-02 LAB — LIPID PANEL
Total CHOL/HDL Ratio: 6 Ratio
VLDL: 42 mg/dL — ABNORMAL HIGH (ref 0–40)

## 2011-12-02 LAB — COMPREHENSIVE METABOLIC PANEL
ALT: 24 U/L (ref 0–53)
AST: 26 U/L (ref 0–37)
CO2: 28 mEq/L (ref 19–32)
Chloride: 105 mEq/L (ref 96–112)
Creat: 0.99 mg/dL (ref 0.50–1.35)
Sodium: 141 mEq/L (ref 135–145)
Total Bilirubin: 0.5 mg/dL (ref 0.3–1.2)
Total Protein: 7.3 g/dL (ref 6.0–8.3)

## 2011-12-02 LAB — TSH: TSH: 0.63 u[IU]/mL (ref 0.350–4.500)

## 2011-12-02 NOTE — Assessment & Plan Note (Signed)
Rechecking lipids today. 

## 2011-12-02 NOTE — Patient Instructions (Signed)

## 2011-12-02 NOTE — Progress Notes (Signed)
S:  Patient presents today for general hypertension follow up otherwise no acute issues.  HTN:  Checking BPS Daily ?:no BP Range:120s-104s Any HA, CP, SOB?:no Any Medication Side Effects:no Medication Compliance:yes Salt intake?:high  Exercise?:kickboxing 3 times a week Weight Loss?:yes  O:  Current Outpatient Prescriptions  Medication Sig Dispense Refill  . allopurinol (ZYLOPRIM) 300 MG tablet TAKE ONE TABLET BY MOUTH EVERY DAY  30 tablet  6  . aspirin 81 MG chewable tablet Chew 81 mg by mouth daily.       . colchicine 0.6 MG tablet Take 0.6 mg by mouth 2 (two) times daily. Take every hour times 4 tonight, then twice daily      . lisinopril (PRINIVIL,ZESTRIL) 20 MG tablet Take 1 tablet (20 mg total) by mouth daily.  30 tablet  3  . meloxicam (MOBIC) 15 MG tablet Take 15 mg by mouth daily.        . Omega-3 Fatty Acids (FISH OIL) 1000 MG CAPS No directions given       . omeprazole (PRILOSEC) 20 MG capsule Take 1 capsule (20 mg total) by mouth 2 (two) times daily.  60 capsule  11  . SUMAtriptan (IMITREX) 50 MG tablet Take 1 tablet (50 mg total) by mouth every 2 (two) hours as needed for migraine.  10 tablet  1    Wt Readings from Last 3 Encounters:  12/02/11 310 lb 12.8 oz (140.978 kg)  11/25/11 310 lb (140.615 kg)  06/17/11 315 lb (142.883 kg)   Temp Readings from Last 3 Encounters:  04/03/11 97.3 F (36.3 C) Oral  03/05/11 98.3 F (36.8 C) Oral  10/19/10 97.8 F (36.6 C) Oral   BP Readings from Last 3 Encounters:  12/02/11 130/80  11/25/11 148/99  06/17/11 128/80   Pulse Readings from Last 3 Encounters:  12/02/11 64  11/25/11 61  06/17/11 58    General: alert and cooperative HEENT: PERRLA Heart: S1, S2 normal, no murmur, rub or gallop, regular rate and rhythm Lungs: clear to auscultation, no wheezes or rales and unlabored breathing Abdomen:obese abdomen, non tender  Extremities: extremities normal, atraumatic, no cyanosis or edema Skin:no rashes Neurology:  normal without focal findings, mental status, speech normal, alert and oriented x3, PERLA and reflexes normal and symmetric   A/P:

## 2011-12-02 NOTE — Assessment & Plan Note (Signed)
Well controlled on ACE. Will check Cr and K. Encouraged continued exercise.

## 2011-12-02 NOTE — Assessment & Plan Note (Signed)
10 pound weight loss over the last year. Encouraged continued exercise.

## 2011-12-03 ENCOUNTER — Encounter: Payer: Self-pay | Admitting: Family Medicine

## 2011-12-03 DIAGNOSIS — E781 Pure hyperglyceridemia: Secondary | ICD-10-CM

## 2011-12-03 MED ORDER — PRAVASTATIN SODIUM 40 MG PO TABS: 40.0000 mg | ORAL_TABLET | Freq: Every day | ORAL | Status: DC

## 2012-01-06 ENCOUNTER — Other Ambulatory Visit: Payer: Self-pay | Admitting: Family Medicine

## 2012-04-07 ENCOUNTER — Other Ambulatory Visit: Payer: Self-pay | Admitting: Family Medicine

## 2012-04-07 DIAGNOSIS — I10 Essential (primary) hypertension: Secondary | ICD-10-CM

## 2012-04-07 MED ORDER — LISINOPRIL 20 MG PO TABS: 20.0000 mg | ORAL_TABLET | Freq: Every day | ORAL | Status: DC

## 2012-04-07 NOTE — Telephone Encounter (Signed)
Called pt to transfer his RX to his pharmacy of choice and also to schedule OV with new PCP . Pt agreed. Lorenda Hatchet, Renato Battles

## 2012-04-07 NOTE — Telephone Encounter (Signed)
Patient is calling for a new prescription with refills on his Lisinopril to go to [facility] on [address].  This is his new pharmacy.

## 2012-04-13 ENCOUNTER — Encounter: Payer: Self-pay | Admitting: Family Medicine

## 2012-04-13 ENCOUNTER — Ambulatory Visit (INDEPENDENT_AMBULATORY_CARE_PROVIDER_SITE_OTHER): Payer: 59 | Admitting: Family Medicine

## 2012-04-13 VITALS — BP 134/90 | HR 73 | Ht 74.0 in | Wt 314.0 lb

## 2012-04-13 DIAGNOSIS — M25529 Pain in unspecified elbow: Secondary | ICD-10-CM

## 2012-04-13 DIAGNOSIS — M7710 Lateral epicondylitis, unspecified elbow: Secondary | ICD-10-CM

## 2012-04-13 DIAGNOSIS — M25521 Pain in right elbow: Secondary | ICD-10-CM

## 2012-04-13 DIAGNOSIS — M109 Gout, unspecified: Secondary | ICD-10-CM

## 2012-04-13 NOTE — Progress Notes (Signed)
  Subjective:    Patient ID: [name], male    DOB: Sep 16, 1965, 47 y.o.   MRN: [id number]  HPI: Pt is a 46yo male who presents to clinic with right elbow pain x1-2 weeks. Pt is a Writer at [employer] Pt states he was moving boxes at home and something fell and hit his right elbow on the lateral side. He had 3-4 days of gross swelling that went down. Since then he has had sharp pain/pressure over the lateral elbow worse with grip and "lifting." Ibuprofen has helped some, off and on. Overall the pain has been better than it was but is still present.  Of note, pt has a history of gout and has not been taking allopurinol for about a month, "to see if he could go without the pill without it flaring up in his foot." He had a small flare in his left foot about three weeks ago after eating shrimp, that improved with colchicine. Other than that, no gout flares. Pt has never had gouty pain in elbows or knees before.  Review of Systems: As above. Otherwise denies fevers/chills, rash, N/V, continued swelling in joint(s).     Objective:   Physical Exam BP 134/90  Pulse 73  Ht 6\' 2"  (1.88 m)  Wt 314 lb (142.429 kg)  BMI 40.32 kg/m2 Gen: large adult man in NAD, pleasant and cooperative with exam Pulm: CTAB, no wheezes Cardio: RRR, normal S1, S2 MSK:  Right elbow with mild soft-tissue swelling to lateral elbow compared to left; no warmth or overlying erythema/rash  Some bony tenderness over left epicondyle as well as worse pain with extension of wrist/fingers against resistance  Pain in same area with elbow flexion and extension, as well as with hand grip  Good active and passive ROM in elbows and wrists bilaterally, no pain on left     Assessment & Plan:

## 2012-04-13 NOTE — Assessment & Plan Note (Signed)
A: Most likely diagnosis given exam and history; pt with repetitive lifting at work, likely with some baseline elbow pain exacerbated by recent injury moving boxes at home. DDx also includes bone contusion or fracture, but fracture seems unlikely given good full ROM and no obvious deformity or instability of elbow.  P: Pt instructed to take ibuprofen 600 mg TID scheduled for 5 days and instructed to ice the area 2-3 times per day (15 minutes on, 15 minutes off). Pt also given handout on exercises for lateral epicondylitis. To return to clinic in 2-3 weeks; consider referral to sports medicine if no improvement by that time.

## 2012-04-13 NOTE — Assessment & Plan Note (Addendum)
A: Pt without current flare of gout, not taking allopurinol currently for about 1 month "to see if he can go without it." Current elbow pain not likely gout. Planning to change pharmacies and will need new Rx's/refills within the next few weeks.  P: No change to medication regimen, no refills requested today. Pt requested to follow up with PCP Dr. Ashley Royalty.

## 2012-04-13 NOTE — Patient Instructions (Addendum)
Thank you for coming into clinic today!  Dr. Katrinka Blazing and I think that you have something called "lateral epicondylitis," an inflammation of the elbow where the muscles attach to the side. I want you to take ibuprofen 600 mg three times a day, every day for 5 days. You can also ice the area (15 minutes on, 15 minutes off) for an hour at a time, three times a day. There are some exercises you can do, as well. Otherwise, I would like you to come back in 2-3 weeks to get checked again. If you're still having problems, we might have you go to see the sports medicine doctors.

## 2012-06-01 ENCOUNTER — Encounter (HOSPITAL_COMMUNITY): Payer: Self-pay | Admitting: *Deleted

## 2012-06-01 ENCOUNTER — Emergency Department (HOSPITAL_COMMUNITY): Payer: 59

## 2012-06-01 ENCOUNTER — Observation Stay (HOSPITAL_COMMUNITY)
Admission: EM | Admit: 2012-06-01 | Discharge: 2012-06-01 | Disposition: A | Discharge status: Home / Self Care | Payer: 59 | Attending: Emergency Medicine | Admitting: Emergency Medicine

## 2012-06-01 DIAGNOSIS — Z7982 Long term (current) use of aspirin: Secondary | ICD-10-CM | POA: Insufficient documentation

## 2012-06-01 DIAGNOSIS — R079 Chest pain, unspecified: Principal | ICD-10-CM | POA: Insufficient documentation

## 2012-06-01 DIAGNOSIS — Z87891 Personal history of nicotine dependence: Secondary | ICD-10-CM | POA: Insufficient documentation

## 2012-06-01 DIAGNOSIS — G4733 Obstructive sleep apnea (adult) (pediatric): Secondary | ICD-10-CM | POA: Insufficient documentation

## 2012-06-01 DIAGNOSIS — M129 Arthropathy, unspecified: Secondary | ICD-10-CM | POA: Insufficient documentation

## 2012-06-01 DIAGNOSIS — Z79899 Other long term (current) drug therapy: Secondary | ICD-10-CM | POA: Insufficient documentation

## 2012-06-01 LAB — COMPREHENSIVE METABOLIC PANEL
ALT: 31 U/L (ref 0–53)
AST: 31 U/L (ref 0–37)
Alkaline Phosphatase: 78 U/L (ref 39–117)
CO2: 27 mEq/L (ref 19–32)
Calcium: 9.5 mg/dL (ref 8.4–10.5)
Chloride: 103 mEq/L (ref 96–112)
GFR calc non Af Amer: >90 mL/min (ref >90)
Potassium: 4.1 mEq/L (ref 3.5–5.1)
Sodium: 139 mEq/L (ref 135–145)
Total Bilirubin: 0.4 mg/dL (ref 0.3–1.2)

## 2012-06-01 LAB — CBC WITH DIFFERENTIAL/PLATELET
Basophils Absolute: 0 10*3/uL (ref 0.0–0.1)
Lymphocytes Relative: 35 % (ref 12–46)
Lymphs Abs: 2.7 10*3/uL (ref 0.7–4.0)
Neutro Abs: 4.3 10*3/uL (ref 1.7–7.7)
Neutrophils Relative %: 55 % (ref 43–77)
Platelets: 220 10*3/uL (ref 150–400)
RBC: 4.92 MIL/uL (ref 4.22–5.81)
RDW: 12.6 % (ref 11.5–15.5)
WBC: 7.8 10*3/uL (ref 4.0–10.5)

## 2012-06-01 MED ORDER — GI COCKTAIL ~~LOC~~
30.0000 mL | Freq: Once | ORAL | Status: AC
  Administered 2012-06-01: 30 mL via ORAL
  Filled 2012-06-01: qty 30

## 2012-06-01 MED ORDER — MORPHINE SULFATE 4 MG/ML IJ SOLN
8.0000 mg | Freq: Once | INTRAMUSCULAR | Status: AC
  Administered 2012-06-01: 8 mg via INTRAVENOUS
  Filled 2012-06-01: qty 2

## 2012-06-01 MED ORDER — BENZONATATE 100 MG PO CAPS: 100.0000 mg | ORAL_CAPSULE | Freq: Three times a day (TID) | ORAL | Status: DC | PRN

## 2012-06-01 NOTE — ED Provider Notes (Signed)
History     CSN: [id number]  Arrival date & time 06/01/12  1253   First MD Initiated Contact with Patient 06/01/12 1344      Chief Complaint  Patient presents with  . Chest Pain    (Consider location/radiation/quality/duration/timing/severity/associated sxs/prior treatment) HPI Comments: 46 year old male with a history of hypertension, sleep apnea and acid reflux presents emergency department with chief complaint of chest pain.  Onset of symptoms began around noon while doing the dishes and change in laundry.  Pain is located substernal with radiation up to head.  Pain is described as sharp and worsened with deep inhalation.  Associated symptoms include cough x2 weeks, emesis x2 and shortness of breath.  Patient denies diaphoresis, nausea, jaw pain, left arm pain, palpitations, claudication, hemoptysis, leg swelling, recent travel or past DVT.  Cardiac cath (Dr. Mila Homer-  CONCLUSION:  1.  Normal left ventricular systolic function.  2.  No evidence of coronary artery disease.  2D echo- 2006, Normal EF  The history is provided by the patient.    Past Medical History  Diagnosis Date  . Arthritis   . 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 History of MRI of cervical spine 09/25/10    normal; left shoulder normal as well  . Obstructive sleep apnea of adult 04/29/2011    moderate on CPAP    History reviewed. No pertinent past surgical history.  History reviewed. No pertinent family history.  History  Substance Use Topics  . Smoking status: Former Smoker    Types: Cigarettes    Quit date: 04/02/1994  . Smokeless tobacco: Never Used  . Alcohol Use: Not on file      Review of Systems  Constitutional: Negative for fever, chills, diaphoresis, fatigue and unexpected weight change.  HENT: Negative for congestion, neck pain and neck stiffness.   Eyes: Negative for visual disturbance.  Respiratory: Positive  for chest tightness and shortness of breath. Negative for apnea, cough, wheezing and stridor.   Cardiovascular: Positive for chest pain. Negative for palpitations and leg swelling.  Gastrointestinal: Positive for vomiting. Negative for nausea, abdominal pain, diarrhea and blood in stool.  Genitourinary: Negative for dysuria, urgency, hematuria and flank pain.  Musculoskeletal: Negative for myalgias, back pain and gait problem.  Skin: Negative for pallor.  Neurological: Negative for dizziness, syncope, light-headedness and headaches.  All other systems reviewed and are negative.    Allergies  Penicillins  Home Medications   Current Outpatient Rx  Name Route Sig Dispense Refill  . ALLOPURINOL 300 MG PO TABS Oral Take 300 mg by mouth daily.    . ASPIRIN 81 MG PO CHEW Oral Chew 81 mg by mouth daily.     . ASPIRIN-ACETAMINOPHEN-CAFFEINE 250-250-65 MG PO TABS Oral Take 1-2 tablets by mouth every 6 (six) hours as needed. migraines    . IBUPROFEN 200 MG PO TABS Oral Take 800 mg by mouth every 6 (six) hours as needed. For pain    . LISINOPRIL 20 MG PO TABS Oral Take 20 mg by mouth daily.    Marland Kitchen FISH OIL 1000 MG PO CAPS Oral Take 2,000 mg by mouth daily. No directions given    . OMEPRAZOLE 20 MG PO CPDR Oral Take 20 mg by mouth 2 (two) times daily.    Marland Kitchen OVER THE COUNTER MEDICATION Oral Take 2 tablets by mouth every 6 (six) hours as needed. Sinus headaches    . SUMATRIPTAN SUCCINATE 50 MG  PO TABS Oral Take 50 mg by mouth every 2 (two) hours as needed. For migraines      BP 126/85  Pulse 65  Temp 97.9 F (36.6 C) (Oral)  Resp 21  SpO2 99%  Physical Exam  Nursing note and vitals reviewed. Constitutional: He appears well-developed and well-nourished. No distress.  HENT:  Head: Normocephalic and atraumatic.  Eyes: Conjunctivae normal and EOM are normal. Pupils are equal, round, and reactive to light.  Neck: Normal range of motion. Neck supple. Normal carotid pulses and no JVD present.  Carotid bruit is not present. No rigidity. Normal range of motion present.  Cardiovascular: Normal rate, regular rhythm, S1 normal, S2 normal, normal heart sounds, intact distal pulses and normal pulses.  Exam reveals no gallop and no friction rub.   No murmur heard.      No pitting edema bilaterally, RRR, no aberrant sounds on auscultations, distal pulses intact, no carotid bruit or JVD.   Pulmonary/Chest: Effort normal and breath sounds normal. No accessory muscle usage or stridor. No respiratory distress. He exhibits no tenderness and no bony tenderness.  Abdominal: Bowel sounds are normal.       Morbidly obese, non tender. No pulsatile aorta.   Skin: Skin is warm, dry and intact. No rash noted. He is not diaphoretic. No cyanosis. Nails show no clubbing.    ED Course  Procedures (including critical care time)   Labs Reviewed  CBC WITH DIFFERENTIAL  POCT I-STAT TROPONIN I  COMPREHENSIVE METABOLIC PANEL   Dg Chest Portable 1 View  06/01/2012  *RADIOLOGY REPORT*  Clinical Data: Chest pain.  Short of breath.  PORTABLE CHEST - 1 VIEW  Comparison: 09/05/2010  Findings: Artifact overlies chest.  Heart size is normal. Mediastinal shadows are normal.  Lungs are clear.  The vascularity is normal.  No bony abnormality.  IMPRESSION: No active disease   Original Report Authenticated By: Thomasenia Sales, M.D.      No diagnosis found.   Date: 06/01/2012  Rate: 69  Rhythm: normal sinus rhythm  QRS Axis: normal  Intervals: normal  ST/T Wave abnormalities: early repolarization  Conduction Disutrbances:none  Narrative Interpretation:   Old EKG Reviewed: no changes seen  Consult Cardiology; [facility]. Serial troponin then dc. Follow up appointment set up by Anner Crete.   MDM  Chest pain  Patient is to be moved to CDU for 6:00pm troponin pending disposition. Anticipate discharge with recommendation to follow up [facility] in regards to today's hospital visit. Chest pain is not  likely of cardiac or pulmonary etiology d/t presentation, perc negative, VSS, no tracheal deviation, no JVD or new murmur, RRR, breath sounds equal bilaterally, EKG without acute abnormalities, negative troponin, and negative CXR. Pt has been advised to return to the ED is CP becomes exertional, associated with diaphoresis or nausea, radiates to left jaw/arm, worsens or becomes concerning in any way. Pt appears reliable for follow up and is agreeable to discharge. Pt will be moved to CDU & plan has been discussed with mid level resuming care.   Case has been discussed with and seen by Dr. Radford Pax who agrees with the above plan to discharge.          Jaci Carrel, [place] 06/01/12 1503

## 2012-06-01 NOTE — Progress Notes (Signed)
Utilization review completed.  

## 2012-06-01 NOTE — ED Notes (Addendum)
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 Pt states substernal chest pain radiating into his head, took omeprazole due to history of GERD, ASA 324 mg, was given nitro SL x2 and morphine 4mg . Pt has 20g in LAC started PTA. Pt currently rating pain 7/10. Pt also c/o dry cough over last week.

## 2012-06-01 NOTE — ED Provider Notes (Signed)
3:48 PM Patient has moved to CDU for holding.  Sign out received from Jaynie Crumble, PA-C, outgoing CDU provider.  Plan is for repeat troponin at 6pm.  If negative, pt to be d/c home with [facility] follow up.    4:23 PM Patient reports mild persistent discomfort of his chest, declines any pain medication at this time.  Denies SOB.  States he has been coughing for the past 2 weeks and the pain is worse when he coughs, though this pain feels like a different process than the "bronchitis" he suspects he has.  Patient is A&Ox4, NAD, RRR, no m/r/g, chest nontender, lungs CTAB, abd soft, NT, extremities without edema, distal pulses intact. I discussed all results to this point with the patient.  Plan is for repeat troponin at 6pm.    8:01 PM Troponin is negative (was not drawn at 6.  I did order one at 7pm).  Pt reports he continues to feel well, no needs at this time.  Reports coughing fits at night, unrelieved with the albuterol inhaler he has at home.  Will add tessalon perles.  Pt to follow up with [facility].  Pt d/c home.  Pt verbalizes understanding and agrees with plan.  Pt given return precautions.   Results for orders placed during the hospital encounter of 06/01/12  CBC WITH DIFFERENTIAL      Component Value Range   WBC 7.8  4.0 - 10.5 K/uL   RBC 4.92  4.22 - 5.81 MIL/uL   Hemoglobin 14.0  13.0 - 17.0 g/dL   HCT 16.1  09.6 - 04.5 %   MCV 82.5  78.0 - 100.0 fL   MCH 28.5  26.0 - 34.0 pg   MCHC 34.5  30.0 - 36.0 g/dL   RDW 40.9  81.1 - 91.4 %   Platelets 220  150 - 400 K/uL   Neutrophils Relative 55  43 - 77 %   Neutro Abs 4.3  1.7 - 7.7 K/uL   Lymphocytes Relative 35  12 - 46 %   Lymphs Abs 2.7  0.7 - 4.0 K/uL   Monocytes Relative 9  3 - 12 %   Monocytes Absolute 0.7  0.1 - 1.0 K/uL   Eosinophils Relative 1  0 - 5 %   Eosinophils Absolute 0.1  0.0 - 0.7 K/uL   Basophils Relative 0  0 - 1 %   Basophils Absolute 0.0  0.0 - 0.1 K/uL  COMPREHENSIVE METABOLIC PANEL      Component Value Range   Sodium 139  135 - 145 mEq/L   Potassium 4.1  3.5 - 5.1 mEq/L   Chloride 103  96 - 112 mEq/L   CO2 27  19 - 32 mEq/L   Glucose, Bld 92  70 - 99 mg/dL   BUN 9  6 - 23 mg/dL   Creatinine, Ser 7.82  0.50 - 1.35 mg/dL   Calcium 9.5  8.4 - 95.6 mg/dL   Total Protein 7.5  6.0 - 8.3 g/dL   Albumin 3.9  3.5 - 5.2 g/dL   AST 31  0 - 37 U/L   ALT 31  0 - 53 U/L   Alkaline Phosphatase 78  39 - 117 U/L   Total Bilirubin 0.4  0.3 - 1.2 mg/dL   GFR calc non Af Amer >90  >90 mL/min   GFR calc Af Amer >90  >90 mL/min  POCT I-STAT TROPONIN I      Component Value Range   Troponin i, poc  0.00  0.00 - 0.08 ng/mL   Comment 3           POCT I-STAT TROPONIN I      Component Value Range   Troponin i, poc 0.00  0.00 - 0.08 ng/mL   Comment 3            Dg Chest Portable 1 View  06/01/2012  *RADIOLOGY REPORT*  Clinical Data: Chest pain.  Short of breath.  PORTABLE CHEST - 1 VIEW  Comparison: 09/05/2010  Findings: Artifact overlies chest.  Heart size is normal. Mediastinal shadows are normal.  Lungs are clear.  The vascularity is normal.  No bony abnormality.  IMPRESSION: No active disease   Original Report Authenticated By: Thomasenia Sales, M.D.       [place], [place] 06/01/12 2006

## 2012-06-01 NOTE — ED Provider Notes (Signed)
Medical screening examination/treatment/procedure(s) were performed by non-physician practitioner and as supervising physician I was immediately available for consultation/collaboration.    Nelia Shi, MD 06/01/12 [phone]

## 2012-10-23 ENCOUNTER — Encounter: Payer: Self-pay | Admitting: Family Medicine

## 2012-10-23 ENCOUNTER — Ambulatory Visit (INDEPENDENT_AMBULATORY_CARE_PROVIDER_SITE_OTHER): Payer: 59 | Admitting: Family Medicine

## 2012-10-23 VITALS — BP 139/95 | HR 74 | Ht 74.0 in | Wt 317.7 lb

## 2012-10-23 DIAGNOSIS — J019 Acute sinusitis, unspecified: Secondary | ICD-10-CM

## 2012-10-23 DIAGNOSIS — J329 Chronic sinusitis, unspecified: Secondary | ICD-10-CM | POA: Insufficient documentation

## 2012-10-23 HISTORY — DX: Chronic sinusitis, unspecified: J32.9

## 2012-10-23 MED ORDER — DOXYCYCLINE HYCLATE 100 MG PO TABS: 100.0000 mg | ORAL_TABLET | Freq: Two times a day (BID) | ORAL | Status: DC

## 2012-10-23 MED ORDER — BENZONATATE 100 MG PO CAPS: 100.0000 mg | ORAL_CAPSULE | Freq: Two times a day (BID) | ORAL | Status: DC | PRN

## 2012-10-23 MED ORDER — GUAIFENESIN ER 600 MG PO TB12: 1200.0000 mg | ORAL_TABLET | Freq: Two times a day (BID) | ORAL | Status: DC

## 2012-10-23 NOTE — Patient Instructions (Signed)
I think you have a virus causing your symptoms.    Antibiotics don't work against viruses, but there are some medications that might help your symptoms.    I will write prescriptions for Tessalon perles (for cough) and guaifenesin (for congestion). It could be a bacteria, since your symptoms have lasted this long and are causing this much trouble.    Typically sinus infections get better with time with just "supportive care" (treating symptoms).    I will write a prescription for an antibiotic that you can fill next week if you are not getting better without it. Make sure you keep your fluid intake up. If you start getting worse instead of better, call the clinic and come back to see [place]. Please feel free to call with questions at any time. --Dr. Casper Harrison

## 2012-10-23 NOTE — Progress Notes (Signed)
  Subjective:    Patient ID: [name], male    DOB: 1965-08-28, 47 y.o.   MRN: [id number]  HPI: Pt presents to clinic for SDA for "sinus infection" for about 1-2 weeks. Pt complains of head congestion, neck pain/soreness, runny nose with drainage, and cough with small amount of production of yellowish phlegm. Symptoms have occurred "on and off" for several weeks and "have never really gone away completely," but this episode has lasted 10-12 days. Pt took Tylenol Cold last week with some relief but symptoms have been worse over the past 4-5 days and Tylenol has not been helping as much.  Sick contacts: wife with similar symptoms; pt unsure of "who got sick first." Few co-workers at [employer] with similar symptoms.  SH: Former smoker.  Review of Systems: As above. Otherwise no fever or vomiting. Some nausea and one episode of emesis early last week. No bowel/bladder habit change, no diarrhea. No SOB or chest pain. Normal appetite.     Objective:   Physical Exam BP 139/95  Pulse 74  Ht 6\' 2"  (1.88 m)  Wt 317 lb 11.2 oz (144.108 kg)  BMI 40.77 kg/m2 General: adult male, in NAD, pleasant/cooperative HEENT: EOMI, PERRLA, conjunctivae clear, MMM, TM's clear bilaterally  audible congestion with breathing, occasional cough/sniffling  Post oropharynx with redness/cobblestoning but no frank exudate  Neck supple with full ROM; mild tenderness to palpation of tonsilar area, mild lymphadenopathy Card: RRR, no murmur Pulm: CTAB, slight coarsity of breath sounds that clears with cough Abd: soft, nondistended, BS+ Ext: warm, well-perfused Skin: no rashes noted; warm/dry/intact     Assessment & Plan:

## 2012-10-23 NOTE — Assessment & Plan Note (Signed)
A: Most likely viral etiology. Symptoms have been present "on and off" for weeks, so doubt bacterial infection unless superimposed on viral illness. Exam generally benign other than mildly elevated BP, pt non-toxic appearing. Congestion is singlemost bothersome symptom.  P: Rx for Tessalon perles and Mucinex for symptomatic therapy. If symptoms worsen over weekend, pt was provided a printed Rx for doxycycline 100 mg BID for 5 days. Red flags and supportive care reviewed. Return to clinic PRN.

## 2012-11-10 ENCOUNTER — Ambulatory Visit: Admission: RE | Admit: 2012-11-10 | Payer: 59 | Source: Ambulatory Visit

## 2012-11-10 ENCOUNTER — Ambulatory Visit (INDEPENDENT_AMBULATORY_CARE_PROVIDER_SITE_OTHER): Payer: 59 | Admitting: Family Medicine

## 2012-11-10 ENCOUNTER — Encounter: Payer: Self-pay | Admitting: Family Medicine

## 2012-11-10 ENCOUNTER — Ambulatory Visit
Admission: RE | Admit: 2012-11-10 | Discharge: 2012-11-10 | Disposition: A | Discharge status: Home / Self Care | Payer: 59 | Source: Ambulatory Visit | Attending: Family Medicine | Admitting: Family Medicine

## 2012-11-10 VITALS — BP 147/97 | HR 64 | Temp 97.6°F | Ht 74.0 in | Wt 317.6 lb

## 2012-11-10 DIAGNOSIS — M25562 Pain in left knee: Secondary | ICD-10-CM

## 2012-11-10 DIAGNOSIS — M25561 Pain in right knee: Secondary | ICD-10-CM | POA: Insufficient documentation

## 2012-11-10 DIAGNOSIS — M25569 Pain in unspecified knee: Secondary | ICD-10-CM

## 2012-11-10 MED ORDER — MELOXICAM 7.5 MG PO TABS: 7.5000 mg | ORAL_TABLET | Freq: Every day | ORAL | Status: DC

## 2012-11-10 NOTE — Patient Instructions (Addendum)
It was nice seeing you today.  I have prescribed Mobic for your knee pain.  You can also take tylenol as well (do not exceed 4000mg  a day).  I will send you a letter with your xray results.  Please follow up with your PCP in 2 weeks - 1 month.  Avoid heavy lifting.  Low impact exercise is okay.

## 2012-11-10 NOTE — Assessment & Plan Note (Signed)
Given clinical presentation and physical exam, primary concern was DJD/Osteoarthritis. Standing Xray's were obtained today.  I personally reviewed the xray and it showed slight narrowing of medial joint space of the right knee.  Left knee film was negative.   Will treat with trial of Mobic and Tylenol.  Patient instructed to avoid high impact/loading exercises.  Also discussed weight loss with patient as this is a contributing factor.  Patient now doing weightwatchers and exercising regularly. Patient to follow up with PCP in 2 weeks - 1 month for further evaluation/work up.  May consider PT or steroid injection at that time.

## 2012-11-10 NOTE — Progress Notes (Signed)
Subjective:     Patient ID: Boe Deans, male   DOB: Aug 30, 1965, 47 y.o.   MRN: [id number]  HPI 47 year old male presents for same day appointment today with complaints of knee pain.  1) Knee pain - right - Patient reports that he has had knee pain "on and off for years" - This weekend, he pain worsened.  Currently 7/10 in severity.  Pain is primarily located on the medial side of his knee.  He has not appreciated any swelling or redness.   - Patient denies any recent trauma, fall, or injury.  However, patient has recently started exercising regularly at the [facility].  Additionally, he lifts heavy items at work (upwards of 100 lbs.)  Social Hx - Former smoker.  Review of Systems Per HPI with the following additions: no fevers, other joints affected.  No warmth to knee. Some weakness noted secondary to pain.     Objective:   Physical Exam General: well appearing obese gentleman in NAD. MSK: Left knee: crepitus appreciated upon examination.  No swelling or effusion present.  No surrounding erythema or warmth.  Slightly tender to palpation anteriorly in the quadriceps muscle and some tenderness upon palpation of medial joint space.  McMurray's negative.  Anterior and posterior drawer negative.  No laxity with varus and valgus stress.   Right Knee: Good ROM, no crepitus, no pain upon palpation.  No swelling or erythema appreciated.    Assessment:         Plan:

## 2012-11-12 ENCOUNTER — Telehealth: Payer: Self-pay | Admitting: Family Medicine

## 2012-11-12 NOTE — Telephone Encounter (Signed)
Per Dr Adriana Simas patient was given results and instructed to schedule an appointment with his PCP if still having symptoms.Patient voiced understanding. Emilyrose Darrah, [place] Bouquet

## 2012-11-12 NOTE — Telephone Encounter (Signed)
Pt is asking about his xray results - his knee is still bothering him and hasn't heard any results - saw Dr Adriana Simas

## 2012-11-23 ENCOUNTER — Ambulatory Visit (INDEPENDENT_AMBULATORY_CARE_PROVIDER_SITE_OTHER): Payer: 59 | Admitting: Family Medicine

## 2012-11-23 ENCOUNTER — Encounter: Payer: Self-pay | Admitting: Family Medicine

## 2012-11-23 VITALS — BP 127/89 | HR 61 | Ht 74.0 in | Wt 313.0 lb

## 2012-11-23 DIAGNOSIS — M25569 Pain in unspecified knee: Secondary | ICD-10-CM

## 2012-11-23 DIAGNOSIS — M25561 Pain in right knee: Secondary | ICD-10-CM

## 2012-11-23 MED ORDER — METHYLPREDNISOLONE ACETATE 40 MG/ML IJ SUSP
40.0000 mg | Freq: Once | INTRAMUSCULAR | Status: AC
  Administered 2012-11-23: 40 mg via INTRA_ARTICULAR

## 2012-11-23 NOTE — Patient Instructions (Signed)
Thank you for coming in today, it was good to see you If you notice increased swelling or redness, develop fever or worsening knee pain please return.   I will place a referral to physical therapy for you Follow up with me in 6-8 weeks.

## 2012-11-23 NOTE — Assessment & Plan Note (Signed)
Right knee pain with positive thessaly and mcmurray with pain.  Possible meniscal tear.  Conservative therapy recommended at this point.  Injected today and referral to physical therapy placed.  If not improving will consider sending to sports medicine for further evaluation.

## 2012-11-23 NOTE — Progress Notes (Signed)
  Subjective:    Patient ID: [name], male    DOB: 1966-05-24, 47 y.o.   MRN: [id number]  Knee Pain     1. Knee pain:  C/o  R knee pain for the past 3-4 weeks.  Was seen earlier in the month for same problem.  Standing films obtained showing mild medial compartment narrowing. Reports that his pain began after he was at the gym doing bent over rowing exercises and has gradually been worsening.  Has not noticed any swelling but has locking of the knee, popping with pivoting, pain navigating steps and walking.  Meloxicam has not been helpful to pain.  He does have a history of gout that has affected his L toe previously.    Review of Systems Per HPI    Objective:   Physical Exam  Constitutional:  Obese male, nad   Musculoskeletal:  R knee without effusio.  Tenderness along medial joint line, posterior.  Patellar compression and grind negative.  Pain with Mcmurray, but no catching.  Positive thessaly.  Ligamentous structures intact.     .INJECTION: Patient was given informed consent, signed copy in the chart. Appropriate time out was taken. Area prepped and draped in usual sterile fashion. 1 cc of methylprednisolone 40 mg/ml plus  4 cc of 1% lidocaine without epinephrine was injected into the R knee using a(n) antero-medial approach. The patient tolerated the procedure well. There were no complications. Post procedure instructions were given.       Assessment & Plan:

## 2012-11-24 ENCOUNTER — Telehealth: Payer: Self-pay | Admitting: Family Medicine

## 2012-11-24 MED ORDER — TRAMADOL HCL 50 MG PO TABS: 50.0000 mg | ORAL_TABLET | Freq: Three times a day (TID) | ORAL | Status: DC | PRN

## 2012-11-24 NOTE — Telephone Encounter (Signed)
Will send in tramadol for pain.  If it continues to worsen he needs to follow up.

## 2012-11-24 NOTE — Telephone Encounter (Signed)
Called and informed patient of Rx sent to pharmacy, also to follow up if pain worsens or doesn't improve.[place], Rodena Medin

## 2012-11-24 NOTE — Telephone Encounter (Signed)
Patient is calling because his knee pain is worse today than it was yesterday when he saw Dr. Ashley Royalty and he is asking for some pain medication to be sent to [facility] on [address].  The pain is so bad he had to leave work.

## 2012-11-24 NOTE — Telephone Encounter (Signed)
[place] to PCP for pain med request

## 2012-11-25 ENCOUNTER — Telehealth: Payer: Self-pay | Admitting: Family Medicine

## 2012-11-25 NOTE — Telephone Encounter (Signed)
Patient is calling because he did pick up the Tramadol and it does help when he isn't on his leg all day long, but he has still had to leave work and is not going to be able to work the rest of the week and would like a note.  I attempted to schedule him to see Dr. Ashley Royalty today, but Dr. Ashley Royalty was already booked and double booked so he scheduled for Monday. He thinks it is a good idea to stay off of it for a few days to give it time to rest and heal.   He would like Dr. Ashley Royalty to call him.

## 2012-11-25 NOTE — Telephone Encounter (Signed)
Will forward to Dr Matthews 

## 2012-11-25 NOTE — Telephone Encounter (Signed)
Patient is calling back to check the status of the note for his workplace.  He would really like to be able to give it to them sooner than later if at all possible.

## 2012-11-26 NOTE — Telephone Encounter (Signed)
Note completed.  If he is unable to return by Monday he needs to follow up.

## 2012-11-30 ENCOUNTER — Ambulatory Visit (INDEPENDENT_AMBULATORY_CARE_PROVIDER_SITE_OTHER): Payer: 59 | Admitting: Family Medicine

## 2012-11-30 ENCOUNTER — Encounter: Payer: Self-pay | Admitting: Family Medicine

## 2012-11-30 VITALS — BP 143/91 | HR 64 | Ht 74.0 in | Wt 316.0 lb

## 2012-11-30 DIAGNOSIS — M25561 Pain in right knee: Secondary | ICD-10-CM

## 2012-11-30 DIAGNOSIS — M25569 Pain in unspecified knee: Secondary | ICD-10-CM

## 2012-11-30 NOTE — Patient Instructions (Addendum)
Thank you for coming in today, it was good to see you I think it is ok to return to work after Wednesday If you have worsening pain again call our office If you have not heard from physical therapy by next week give [place] a call back.  Follow up with me in 6-8 weeks so that we can review your chronic medical problems as well.

## 2012-11-30 NOTE — Progress Notes (Signed)
  Subjective:    Patient ID: [name], male    DOB: 04/02/1966, 47 y.o.   MRN: [id number]  Knee Pain     1. R Knee pain:  Here for f/u of R knee pain.  Reports pain has improved significantly since last week after injection.  He is still using ibuprofen with intermittent tramadol for pain, but reports that his pain is 1/10 today.   He has not returned back to work yet and is afraid of irritating it again.  He has not noticed any swelling of the knee, denies fever, joint warmth.    Review of Systems Per HPI    Objective:   Physical Exam  Constitutional:  Obese male, nad   Musculoskeletal:  R knee with normal ROM.  Mild tenderness along medial and lateral joint line.  Patellar grind negative. Mild tenderness of popliteal fossa.  No effusion noted or bakers cyst noted.            Assessment & Plan:

## 2012-11-30 NOTE — Assessment & Plan Note (Signed)
Pain improved significantly.  Concerned about irritating at work, may return to full duty on 5/1.  Given letter.  Has not heard from PT yet, will call if hasn't heard anything by next week.  Continue ibuprofen and tramadol prn for pain, return for worsening symptoms.

## 2012-12-01 ENCOUNTER — Telehealth: Payer: Self-pay | Admitting: Family Medicine

## 2012-12-01 NOTE — Telephone Encounter (Signed)
Letter completed, routed to red team for patient to pick up.

## 2012-12-01 NOTE — Telephone Encounter (Signed)
Pt advised letter ready to be picked up

## 2012-12-01 NOTE — Telephone Encounter (Signed)
[name] employer will not accept the back to work note given.  Need a revised one stating that " patient was under provider's care from 4/23 thru 4/30 and may return back to work on 12/03/12 with no restrictions."  Please contact him when ready for pickup.

## 2012-12-01 NOTE — Telephone Encounter (Signed)
Will forward to Dr Matthews 

## 2012-12-09 ENCOUNTER — Ambulatory Visit: Payer: 59 | Attending: Family Medicine | Admitting: Rehabilitation

## 2012-12-09 DIAGNOSIS — IMO0001 Reserved for inherently not codable concepts without codable children: Secondary | ICD-10-CM | POA: Insufficient documentation

## 2012-12-09 DIAGNOSIS — M25569 Pain in unspecified knee: Secondary | ICD-10-CM | POA: Insufficient documentation

## 2012-12-14 ENCOUNTER — Ambulatory Visit: Payer: 59 | Admitting: Physical Therapy

## 2012-12-17 ENCOUNTER — Ambulatory Visit: Payer: 59 | Admitting: Physical Therapy

## 2012-12-21 ENCOUNTER — Ambulatory Visit: Payer: 59 | Admitting: Physical Therapy

## 2012-12-24 ENCOUNTER — Ambulatory Visit: Payer: 59 | Admitting: Physical Therapy

## 2012-12-30 ENCOUNTER — Ambulatory Visit: Payer: 59 | Admitting: Physical Therapy

## 2012-12-31 ENCOUNTER — Telehealth: Payer: Self-pay | Admitting: Family Medicine

## 2012-12-31 NOTE — Telephone Encounter (Signed)
Pt scheduled to see Dr. Ashley Royalty at 3:30 tomorrow.  Tracye Szuch, Darlyne Russian, CMA

## 2012-12-31 NOTE — Telephone Encounter (Signed)
Will fwd to MD.  Guenther Dunshee L, CMA  

## 2012-12-31 NOTE — Telephone Encounter (Signed)
Pt states knee pain is continuing even after pt. Says tramadol is not helping. Please advise

## 2012-12-31 NOTE — Telephone Encounter (Signed)
Please have patient follow up with me, or if he can be seen on cross cover tomorrow am if pain is not well controlled.  I should have an appt at 3:30 tomorrow afternoon

## 2013-01-01 ENCOUNTER — Ambulatory Visit (INDEPENDENT_AMBULATORY_CARE_PROVIDER_SITE_OTHER): Payer: 59 | Admitting: Family Medicine

## 2013-01-01 ENCOUNTER — Encounter: Payer: Self-pay | Admitting: Family Medicine

## 2013-01-01 ENCOUNTER — Ambulatory Visit: Payer: 59 | Admitting: Family Medicine

## 2013-01-01 VITALS — BP 126/85 | HR 72 | Ht 74.0 in | Wt 317.0 lb

## 2013-01-01 DIAGNOSIS — M25569 Pain in unspecified knee: Secondary | ICD-10-CM

## 2013-01-01 DIAGNOSIS — M25561 Pain in right knee: Secondary | ICD-10-CM

## 2013-01-01 MED ORDER — HYDROCODONE-ACETAMINOPHEN 5-325 MG PO TABS: 1.0000 | ORAL_TABLET | Freq: Four times a day (QID) | ORAL | Status: DC | PRN

## 2013-01-01 NOTE — Progress Notes (Signed)
  Subjective:    Patient ID: Greogory Cornette, male    DOB: 1966/02/07, 47 y.o.   MRN: [id number]  HPI  1. Knee pain:  Here for follow up of R knee pain.  Pain is still persistent, some what worse.  He has been compliant with conservative therapy and has had steroid injection and PT.  Reports that PT seems to make his pain worse.  Reports that his knee often "locks" after sitting or squatting.  Points to pain as being along the medial joint line area.  Reports swelling is better.  Denies fever, chills, erythema or warmth of joint.  Review of Systems Per HPI    Objective:   Physical Exam  Constitutional:  Obese male, nad   Musculoskeletal:  Knee: Normal to inspection with no erythema or effusion or obvious bony abnormalities.  Mild tenderness along medial joint line.    No warmth ROM painful in flexion Ligaments with solid consistent endpoints including ACL, PCL, LCL, MCL. Negative Mcmurray's but positive thesaly Non painful patellar compression. Patellar and quadriceps tendons unremarkable.            Assessment & Plan:

## 2013-01-01 NOTE — Assessment & Plan Note (Addendum)
Continued knee pain with worsening.  Failed conservative treatment at this point.  ?meniscal injury with locking while squatting.  Xrays show only mild DJD.   He does have a history of gout but current history does not seem to indicate this and did not have much improvement with steroid injection.  Will refer to ortho for further evaluation.  Short term norco given for pain control.

## 2013-01-01 NOTE — Patient Instructions (Addendum)
Thank you for coming in today, it was good to see you We will get you a referral to orthopedics Use the hydrocodone as needed for severe pain.  You can also use ibuprofen or aleve

## 2013-01-06 ENCOUNTER — Ambulatory Visit: Payer: 59 | Admitting: Family Medicine

## 2013-01-19 ENCOUNTER — Other Ambulatory Visit: Payer: Self-pay | Admitting: *Deleted

## 2013-01-21 MED ORDER — OMEPRAZOLE 20 MG PO CPDR: 20.0000 mg | DELAYED_RELEASE_CAPSULE | Freq: Two times a day (BID) | ORAL | Status: DC

## 2013-02-02 HISTORY — PX: KNEE ARTHROSCOPY: SHX127

## 2013-06-21 ENCOUNTER — Other Ambulatory Visit: Payer: Self-pay | Admitting: Family Medicine

## 2013-06-21 ENCOUNTER — Telehealth: Payer: Self-pay | Admitting: Family Medicine

## 2013-06-21 NOTE — Telephone Encounter (Signed)
To [facility] red team - please call pt and let him know I have not refilled the requested medications (omeprazole, lisinopril) because he needs an appointment to be seen. He cannot have been taking this medication consistently based on when it was last prescribed.   Latrelle Dodrill, MD

## 2013-06-21 NOTE — Telephone Encounter (Signed)
Called pt. Informed.pt agreed. Lorenda Hatchet, Renato Battles

## 2013-06-28 ENCOUNTER — Telehealth: Payer: Self-pay | Admitting: Family Medicine

## 2013-06-28 MED ORDER — LISINOPRIL 20 MG PO TABS: 20.0000 mg | ORAL_TABLET | Freq: Every day | ORAL | Status: DC

## 2013-06-28 NOTE — Telephone Encounter (Signed)
Forward to PCP for refill.Busick, Robert Lee  

## 2013-06-28 NOTE — Telephone Encounter (Signed)
rx sent in. Pt must keep appt for future refills. See phone note from last week. Latrelle Dodrill, MD

## 2013-06-28 NOTE — Telephone Encounter (Signed)
Pt needs refill on his BP medication lisinopril. He has an appointment for 12/15. jw

## 2013-07-16 ENCOUNTER — Telehealth: Payer: Self-pay | Admitting: Family Medicine

## 2013-07-16 NOTE — Telephone Encounter (Signed)
No answer when I returned call. Will fwd to PCP as FYI.  Eithan Beagle, Darlyne Russian, CMA

## 2013-07-16 NOTE — Telephone Encounter (Signed)
Pt called to say that he is not feeling well and his arm is hurting. I ask if he wanted to come in to be seen today and he said no, that he would go to urgent care or the hospital. Myriam Jacobson

## 2013-07-19 ENCOUNTER — Encounter: Payer: Self-pay | Admitting: Family Medicine

## 2013-07-19 ENCOUNTER — Ambulatory Visit (INDEPENDENT_AMBULATORY_CARE_PROVIDER_SITE_OTHER): Payer: 59 | Admitting: Family Medicine

## 2013-07-19 VITALS — BP 149/91 | HR 62 | Ht 74.0 in | Wt 330.0 lb

## 2013-07-19 DIAGNOSIS — E781 Pure hyperglyceridemia: Secondary | ICD-10-CM

## 2013-07-19 DIAGNOSIS — J3489 Other specified disorders of nose and nasal sinuses: Secondary | ICD-10-CM

## 2013-07-19 DIAGNOSIS — Z23 Encounter for immunization: Secondary | ICD-10-CM

## 2013-07-19 DIAGNOSIS — R0981 Nasal congestion: Secondary | ICD-10-CM

## 2013-07-19 DIAGNOSIS — I10 Essential (primary) hypertension: Secondary | ICD-10-CM

## 2013-07-19 LAB — COMPREHENSIVE METABOLIC PANEL
CO2: 30 mEq/L (ref 19–32)
Calcium: 9.4 mg/dL (ref 8.4–10.5)
Chloride: 105 mEq/L (ref 96–112)
Creat: 0.87 mg/dL (ref 0.50–1.35)
Glucose, Bld: 108 mg/dL — ABNORMAL HIGH (ref 70–99)
Total Bilirubin: 0.4 mg/dL (ref 0.3–1.2)
Total Protein: 7 g/dL (ref 6.0–8.3)

## 2013-07-19 LAB — LIPID PANEL
Cholesterol: 177 mg/dL (ref 0–200)
Total CHOL/HDL Ratio: 5.4 Ratio
Triglycerides: 232 mg/dL — ABNORMAL HIGH (ref <150)
VLDL: 46 mg/dL — ABNORMAL HIGH (ref 0–40)

## 2013-07-19 MED ORDER — AMLODIPINE BESYLATE 10 MG PO TABS: 10.0000 mg | ORAL_TABLET | Freq: Every day | ORAL | Status: DC

## 2013-07-19 MED ORDER — CETIRIZINE HCL 10 MG PO TABS: 10.0000 mg | ORAL_TABLET | Freq: Every day | ORAL | Status: DC

## 2013-07-19 NOTE — Progress Notes (Signed)
Patient ID: [name], male   DOB: 06-23-1966, 47 y.o.   MRN: [id number]  HPI:  Hypertension: Currently taking lisinopril 20mg  daily, although has not taken it for the last three days. On Friday took it and had episode of heart fluttering where he almost passed out. This also happened on Thursday but wasn't as bad. Previously had been taking the lisinopril for at least over one week. This has happened before when he was on lisinopril in the past. He thinks his BP might have been too low. Occasionally checks BP at [facility] and says it was high but doesn't know specific numbers. Hasn't taken lisinopril since Friday because of that episode.  Sinus congestion: wondering what he can do for this. 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 Denies fevers or cough. Just feels congested.   ROS: See HPI  PMFSH: hx HTN  PHYSICAL EXAM: BP 149/91  Pulse 62  Ht 6\' 2"  (1.88 m)  Wt 330 lb (149.687 kg)  BMI 42.35 kg/m2 Gen: NAD HEENT: NCAT, TM's clear bilat, MMM, no oropharyngeal exudates Heart: RRR Lungs: CTAB, NWOB Neuro: grossly nonfocal, speech intact Ext: No appreciable lower extremity edema bilaterally   ASSESSMENT/PLAN:  # Health maintenance:  -flu shot given today   # Allergies: no signs or symptoms of bacterial infection. recommend trial of zyrtec. rx sent in.  See problem based charting for additional assessment/plan.   FOLLOW UP: F/u in 2-3 weeks for BP check.  [place] J. Pollie Meyer, MD [facility]

## 2013-07-19 NOTE — Patient Instructions (Addendum)
It was nice to meet you today!  For blood pressure - I am switching you to a different medicine, called amlodipine. You'll take it once daily. Stop the lisinopril. For allergies - take zyrtec 10mg  daily. I will send in a prescription for you.  We are checking your kidneys, liver, and cholesterol today. I will call you if your test results are not normal.  Otherwise, I will send you a letter.  If you do not hear from me with in 2 weeks please call our office.     Return in 2-3 weeks to recheck your blood pressure and see how you are tolerating the new medicine.  Be well, Dr. Pollie Meyer

## 2013-07-20 NOTE — Assessment & Plan Note (Signed)
Not on any lipid modifying agents currently other than fish oil. Will check lipids today since he is fasting.

## 2013-07-20 NOTE — Assessment & Plan Note (Addendum)
BP elevated, but has not tolerated lisinopril due to episode of feeling lightheaded/heart fluttering. Cardiac exam WNL today. Will d/c lisinopril and start amlodipine. If palpitations persist will consider checking thyroid and/or cardiac monitoring. He has a hx of gout, making HCTZ not a great option for him. F/u in 2-3 weeks for BP recheck. Check renal function today.

## 2013-07-26 ENCOUNTER — Encounter: Payer: Self-pay | Admitting: Family Medicine

## 2013-08-11 ENCOUNTER — Ambulatory Visit (INDEPENDENT_AMBULATORY_CARE_PROVIDER_SITE_OTHER): Payer: 59 | Admitting: Family Medicine

## 2013-08-11 ENCOUNTER — Encounter: Payer: Self-pay | Admitting: Family Medicine

## 2013-08-11 ENCOUNTER — Ambulatory Visit
Admission: RE | Admit: 2013-08-11 | Discharge: 2013-08-11 | Disposition: A | Discharge status: Home / Self Care | Payer: 59 | Source: Ambulatory Visit | Attending: Family Medicine | Admitting: Family Medicine

## 2013-08-11 VITALS — BP 130/89 | HR 73 | Temp 98.1°F | Ht 74.0 in | Wt 332.3 lb

## 2013-08-11 DIAGNOSIS — E785 Hyperlipidemia, unspecified: Secondary | ICD-10-CM

## 2013-08-11 DIAGNOSIS — M109 Gout, unspecified: Secondary | ICD-10-CM

## 2013-08-11 DIAGNOSIS — M25579 Pain in unspecified ankle and joints of unspecified foot: Secondary | ICD-10-CM

## 2013-08-11 DIAGNOSIS — I10 Essential (primary) hypertension: Secondary | ICD-10-CM

## 2013-08-11 DIAGNOSIS — M25571 Pain in right ankle and joints of right foot: Secondary | ICD-10-CM

## 2013-08-11 MED ORDER — COLCHICINE 0.6 MG PO TABS: ORAL_TABLET | ORAL | Status: DC

## 2013-08-11 MED ORDER — ALLOPURINOL 300 MG PO TABS: 300.0000 mg | ORAL_TABLET | Freq: Every day | ORAL | Status: DC

## 2013-08-11 NOTE — Progress Notes (Signed)
Patient ID: [name], male   DOB: 11/23/1965, 48 y.o.   MRN: [id number]  HPI:  HTN: Currently taking amlodipine 10mg  daily. Has checked BP at [facility] and is getting around 140/85. Currently having foot pain, and he wonders if pain is making BP higher today. Denies chest pain, SOB, swelling, vision changes, or headaches.   Foot pain: Has hx of gout and has been out of allopurinol. Has been taking ibuprofen 800mg  daily. The pain first started two days ago. The day before that his grandaughter stepped on it with a high heeled shoe. He hasn't noticed it red or swollen. The ibuprofen has not helped at all.His gout is normally in the first MTP joint but this pain is in the 5th MTP joint. He's not sure where on his foot his granddaughter stepped. He has not tried applying ice. He thinks this is probably a gout flare. He is able to walk on the foot.  HLD: discussed results of recent lipid testing, which suggests need for moderate intensity statin. Pt not taking any cholesterol medicine right now. He strongly prefers to work on lifestyle modification with diet and exercise, and to wait on starting statin.  ROS: See HPI  [place]: hx gout, HTN, HLD  PHYSICAL EXAM: BP 130/89  Pulse 73  Temp(Src) 98.1 F (36.7 C) (Oral)  Ht 6\' 2"  (1.88 m)  Wt 332 lb 4.8 oz (150.73 kg)  BMI 42.65 kg/m2 Gen: NAD HEENT: NCAT Heart: RRR Lungs: CTAB Neuro: grossly nonfocal, speech intact Ext: R foot without erythema or edema. Weak DP pulse? Palpable but good capillary refill, warm. TTP over right MTP joint with pain with movement of that joint as well. No joint erythema or warmth. No bruising or deformity noted.  ASSESSMENT/PLAN:  See problem based charting for additional assessment/plan.   FOLLOW UP: F/u in 3 months for routine medical problems F/u sooner if foot not improved.  [place]. Ardelia Mems, [place]

## 2013-08-11 NOTE — Patient Instructions (Addendum)
It was great to see you again today!  For your foot: we are checking an xray. I also sent in colchicine in case this is gout. Take two tablets, then repeat in one hour if your symptoms are not improved. You must then wait 12 hours prior to the next dose and can take it up to every 12 hours. If not improving within a few days please return to be seen again.  Wait until your pain has been gone for 1 week before starting back on the allopurinol. Avoid weight bearing until we have the xray results.  Return in 3 months for an office visit or sooner if the pain is not better in a few days.  Be well, Dr. Ardelia Mems

## 2013-08-12 ENCOUNTER — Telehealth: Payer: Self-pay | Admitting: Family Medicine

## 2013-08-12 MED ORDER — AMLODIPINE BESYLATE 10 MG PO TABS: 10.0000 mg | ORAL_TABLET | Freq: Every day | ORAL | Status: DC

## 2013-08-12 NOTE — Telephone Encounter (Signed)
Pt called and needs a refill dent to [facility] on his Amlodipine. jw

## 2013-08-12 NOTE — Telephone Encounter (Signed)
Rx sent in Akari Crysler J Delante Karapetyan, MD  

## 2013-08-15 NOTE — Assessment & Plan Note (Signed)
Has ASCVD 10 year risk of >6% so would benefit from moderate intensity statin. Recommended this to pt but he prefers to work on lifestyle modification. Will readdress statin at future visits.

## 2013-08-15 NOTE — Assessment & Plan Note (Signed)
Well-controlled, continue current regimen 

## 2013-08-15 NOTE — Assessment & Plan Note (Addendum)
R foot pain possibly related to gout flare, although injury from trauma (granddaughter stepping on foot) also within differential. Will rx colchicine to treat gout flare and obtain xray of foot to rule out fracture. Advised minimal weight bearing until xray results obtained. Advised pt can restart allopurinol one week after pain improves.  Precepted with Dr. Lindell Noe who agrees with plan to obtain xray and rx colchicine.

## 2013-08-16 ENCOUNTER — Other Ambulatory Visit: Payer: Self-pay | Admitting: Family Medicine

## 2013-11-11 ENCOUNTER — Other Ambulatory Visit: Payer: Self-pay | Admitting: Family Medicine

## 2014-01-26 ENCOUNTER — Telehealth: Payer: Self-pay | Admitting: Family Medicine

## 2014-01-26 MED ORDER — COLCHICINE 0.6 MG PO TABS: ORAL_TABLET | ORAL | Status: DC

## 2014-01-26 NOTE — Telephone Encounter (Signed)
Patient informed of message from MD, expressed understanding. 

## 2014-01-26 NOTE — Telephone Encounter (Signed)
Has gout in both feet. Cant get off of work to see dr Needs refill on the med that starts with a C [facility] on [address]

## 2014-01-26 NOTE — Telephone Encounter (Signed)
Rx for colchicine sent in. Please call pt and inform. If the colchicine does not help his gout he should come in for a same day appt to be evaluated.  Please also remind pt that he is due for a follow up appointment with me to f/u on his chronic medical problems.  [name], MD

## 2014-02-02 DIAGNOSIS — I480 Paroxysmal atrial fibrillation: Secondary | ICD-10-CM

## 2014-02-02 HISTORY — DX: Paroxysmal atrial fibrillation: I48.0

## 2014-03-14 ENCOUNTER — Encounter (HOSPITAL_COMMUNITY): Payer: Self-pay | Admitting: Emergency Medicine

## 2014-03-14 ENCOUNTER — Observation Stay (HOSPITAL_COMMUNITY)
Admission: EM | Admit: 2014-03-14 | Discharge: 2014-03-15 | Disposition: A | Discharge status: Home / Self Care | Payer: 59 | Attending: Family Medicine | Admitting: Family Medicine

## 2014-03-14 ENCOUNTER — Emergency Department (HOSPITAL_COMMUNITY): Payer: 59

## 2014-03-14 DIAGNOSIS — M129 Arthropathy, unspecified: Secondary | ICD-10-CM | POA: Insufficient documentation

## 2014-03-14 DIAGNOSIS — M109 Gout, unspecified: Secondary | ICD-10-CM | POA: Diagnosis present

## 2014-03-14 DIAGNOSIS — R002 Palpitations: Secondary | ICD-10-CM | POA: Diagnosis not present

## 2014-03-14 DIAGNOSIS — G4733 Obstructive sleep apnea (adult) (pediatric): Secondary | ICD-10-CM

## 2014-03-14 DIAGNOSIS — R609 Edema, unspecified: Secondary | ICD-10-CM | POA: Diagnosis not present

## 2014-03-14 DIAGNOSIS — G4700 Insomnia, unspecified: Secondary | ICD-10-CM | POA: Diagnosis present

## 2014-03-14 DIAGNOSIS — Z79899 Other long term (current) drug therapy: Secondary | ICD-10-CM | POA: Diagnosis not present

## 2014-03-14 DIAGNOSIS — R0609 Other forms of dyspnea: Secondary | ICD-10-CM | POA: Diagnosis not present

## 2014-03-14 DIAGNOSIS — G8929 Other chronic pain: Secondary | ICD-10-CM | POA: Diagnosis not present

## 2014-03-14 DIAGNOSIS — Z7982 Long term (current) use of aspirin: Secondary | ICD-10-CM | POA: Diagnosis not present

## 2014-03-14 DIAGNOSIS — I4891 Unspecified atrial fibrillation: Secondary | ICD-10-CM

## 2014-03-14 DIAGNOSIS — I10 Essential (primary) hypertension: Secondary | ICD-10-CM | POA: Diagnosis not present

## 2014-03-14 DIAGNOSIS — M549 Dorsalgia, unspecified: Secondary | ICD-10-CM

## 2014-03-14 DIAGNOSIS — Z87891 Personal history of nicotine dependence: Secondary | ICD-10-CM | POA: Insufficient documentation

## 2014-03-14 DIAGNOSIS — E669 Obesity, unspecified: Secondary | ICD-10-CM

## 2014-03-14 DIAGNOSIS — I499 Cardiac arrhythmia, unspecified: Secondary | ICD-10-CM | POA: Diagnosis present

## 2014-03-14 DIAGNOSIS — K219 Gastro-esophageal reflux disease without esophagitis: Secondary | ICD-10-CM

## 2014-03-14 DIAGNOSIS — E785 Hyperlipidemia, unspecified: Secondary | ICD-10-CM

## 2014-03-14 DIAGNOSIS — R0989 Other specified symptoms and signs involving the circulatory and respiratory systems: Secondary | ICD-10-CM | POA: Insufficient documentation

## 2014-03-14 DIAGNOSIS — Z880 Allergy status to penicillin: Secondary | ICD-10-CM | POA: Diagnosis not present

## 2014-03-14 HISTORY — DX: Gout, unspecified: M10.9

## 2014-03-14 HISTORY — DX: Essential (primary) hypertension: I10

## 2014-03-14 HISTORY — DX: Unspecified atrial fibrillation: I48.91

## 2014-03-14 LAB — HEMOGLOBIN A1C
HEMOGLOBIN A1C: 5.9 % — AB (ref <5.7)
MEAN PLASMA GLUCOSE: 123 mg/dL — AB (ref <117)

## 2014-03-14 LAB — LIPID PANEL
Cholesterol: 187 mg/dL (ref 0–200)
HDL: 30 mg/dL — AB (ref >39)
LDL CALC: 112 mg/dL — AB (ref 0–99)
Total CHOL/HDL Ratio: 6.2 RATIO
Triglycerides: 226 mg/dL — ABNORMAL HIGH (ref <150)
VLDL: 45 mg/dL — ABNORMAL HIGH (ref 0–40)

## 2014-03-14 LAB — BASIC METABOLIC PANEL
Anion gap: 13 (ref 5–15)
BUN: 10 mg/dL (ref 6–23)
CHLORIDE: 103 meq/L (ref 96–112)
CO2: 26 mEq/L (ref 19–32)
Calcium: 9.3 mg/dL (ref 8.4–10.5)
Creatinine, Ser: 0.9 mg/dL (ref 0.50–1.35)
GFR calc Af Amer: >90 mL/min (ref >90)
GFR calc non Af Amer: >90 mL/min (ref >90)
GLUCOSE: 101 mg/dL — AB (ref 70–99)
Potassium: 4.2 mEq/L (ref 3.7–5.3)
Sodium: 142 mEq/L (ref 137–147)

## 2014-03-14 LAB — CBC WITH DIFFERENTIAL/PLATELET
Basophils Absolute: 0 10*3/uL (ref 0.0–0.1)
Basophils Relative: 0 % (ref 0–1)
EOS ABS: 0.1 10*3/uL (ref 0.0–0.7)
Eosinophils Relative: 1 % (ref 0–5)
HCT: 44.1 % (ref 39.0–52.0)
Hemoglobin: 14.5 g/dL (ref 13.0–17.0)
LYMPHS ABS: 2.7 10*3/uL (ref 0.7–4.0)
Lymphocytes Relative: 38 % (ref 12–46)
MCH: 28 pg (ref 26.0–34.0)
MCHC: 32.9 g/dL (ref 30.0–36.0)
MCV: 85.1 fL (ref 78.0–100.0)
Monocytes Absolute: 0.5 10*3/uL (ref 0.1–1.0)
Monocytes Relative: 7 % (ref 3–12)
NEUTROS PCT: 54 % (ref 43–77)
Neutro Abs: 3.6 10*3/uL (ref 1.7–7.7)
Platelets: 205 10*3/uL (ref 150–400)
RBC: 5.18 MIL/uL (ref 4.22–5.81)
RDW: 13 % (ref 11.5–15.5)
WBC: 6.9 10*3/uL (ref 4.0–10.5)

## 2014-03-14 LAB — RAPID URINE DRUG SCREEN, HOSP PERFORMED
AMPHETAMINES: NOT DETECTED
BENZODIAZEPINES: NOT DETECTED
Barbiturates: NOT DETECTED
COCAINE: NOT DETECTED
OPIATES: NOT DETECTED
Tetrahydrocannabinol: NOT DETECTED

## 2014-03-14 LAB — MAGNESIUM: Magnesium: 2 mg/dL (ref 1.5–2.5)

## 2014-03-14 LAB — TSH: TSH: 1.2 u[IU]/mL (ref 0.350–4.500)

## 2014-03-14 LAB — TROPONIN I

## 2014-03-14 LAB — PRO B NATRIURETIC PEPTIDE: Pro B Natriuretic peptide (BNP): 90.3 pg/mL (ref 0–125)

## 2014-03-14 MED ORDER — OFF THE BEAT BOOK
Freq: Once | Status: AC
  Administered 2014-03-14: 23:00:00
  Filled 2014-03-14: qty 1

## 2014-03-14 MED ORDER — ACETAMINOPHEN 325 MG PO TABS: 650.0000 mg | ORAL_TABLET | Freq: Four times a day (QID) | ORAL | Status: DC | PRN

## 2014-03-14 MED ORDER — AMLODIPINE BESYLATE 10 MG PO TABS
10.0000 mg | ORAL_TABLET | Freq: Every day | ORAL | Status: DC
  Administered 2014-03-14 – 2014-03-15 (×2): 10 mg via ORAL
  Filled 2014-03-14 (×2): qty 1

## 2014-03-14 MED ORDER — SODIUM CHLORIDE 0.9 % IJ SOLN
3.0000 mL | INTRAMUSCULAR | Status: DC | PRN
Start: 2014-03-14 — End: 2014-03-15

## 2014-03-14 MED ORDER — ACETAMINOPHEN 650 MG RE SUPP: 650.0000 mg | Freq: Four times a day (QID) | RECTAL | Status: DC | PRN

## 2014-03-14 MED ORDER — ONDANSETRON HCL 4 MG PO TABS: 4.0000 mg | ORAL_TABLET | Freq: Four times a day (QID) | ORAL | Status: DC | PRN

## 2014-03-14 MED ORDER — SODIUM CHLORIDE 0.9 % IJ SOLN: 3.0000 mL | Freq: Two times a day (BID) | INTRAMUSCULAR | Status: DC

## 2014-03-14 MED ORDER — PANTOPRAZOLE SODIUM 40 MG PO TBEC
40.0000 mg | DELAYED_RELEASE_TABLET | Freq: Every day | ORAL | Status: DC
  Administered 2014-03-14 – 2014-03-15 (×2): 40 mg via ORAL
  Filled 2014-03-14 (×2): qty 1

## 2014-03-14 MED ORDER — SODIUM CHLORIDE 0.9 % IJ SOLN
3.0000 mL | Freq: Two times a day (BID) | INTRAMUSCULAR | Status: DC
  Administered 2014-03-14 – 2014-03-15 (×2): 3 mL via INTRAVENOUS

## 2014-03-14 MED ORDER — ASPIRIN 81 MG PO CHEW
81.0000 mg | CHEWABLE_TABLET | Freq: Every day | ORAL | Status: DC
  Administered 2014-03-15: 81 mg via ORAL
  Filled 2014-03-14: qty 1

## 2014-03-14 MED ORDER — APIXABAN 5 MG PO TABS
5.0000 mg | ORAL_TABLET | Freq: Two times a day (BID) | ORAL | Status: DC
  Administered 2014-03-14 – 2014-03-15 (×2): 5 mg via ORAL
  Filled 2014-03-14 (×3): qty 1

## 2014-03-14 MED ORDER — ONDANSETRON HCL 4 MG/2ML IJ SOLN: 4.0000 mg | Freq: Four times a day (QID) | INTRAMUSCULAR | Status: DC | PRN

## 2014-03-14 MED ORDER — SODIUM CHLORIDE 0.9 % IV SOLN
250.0000 mL | INTRAVENOUS | Status: DC | PRN
Start: 2014-03-14 — End: 2014-03-15

## 2014-03-14 NOTE — Progress Notes (Signed)
Set pt.'s CPAP up at the bedside on auto titrate (min: 4, max: 15) via FFM (what pt. Wears at home) Pt. Stated that he would place himself on CPAP tonight before going to bed & didn't need assistance.

## 2014-03-14 NOTE — ED Provider Notes (Signed)
CSN: [id number]     Arrival date & time 03/14/14  5726 History   First MD Initiated Contact with Patient 03/14/14 1002     Chief Complaint  Patient presents with  . Irregular Heart Beat      HPI Pt was seen at 1000. Per pt, c/o gradual onset and worsening of persistent palpitations for the past several weeks, worse over the past 2 days. Pt describes his palpitations as his "heart beating fast" and "fluttering." Has been associated with lightheadedness/near syncope and SOB. Pt's symptoms worsen with laying flat and on exertion. Pt has not called his PMD regarding same. EMS gave ASA and SL ntg x2 without change in his symptoms. Denies CP, no cough, no abd pain, no N/V/D, no back pain, no syncope.    Past Medical History  Diagnosis Date  . Arthritis   . 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 History of MRI of cervical spine 09/25/10    normal; left shoulder normal as well  . Obstructive sleep apnea of adult 04/29/2011    moderate on CPAP  . Gout   . Chronic knee pain   . Chronic foot pain   . Hypertension   . Chronic elbow pain   . Chronic shoulder pain   . Chronic hip pain   . Headache    Past Surgical History  Procedure Laterality Date  . Knee surgery      Right Knee    History  Substance Use Topics  . Smoking status: Former Smoker    Types: Cigarettes    Quit date: 04/02/1994  . Smokeless tobacco: Never Used  . Alcohol Use: No    Review of Systems ROS: Statement: All systems negative except as marked or noted in the HPI; Constitutional: Negative for fever and chills. ; ; Eyes: Negative for eye pain, redness and discharge. ; ; ENMT: Negative for ear pain, hoarseness, nasal congestion, sinus pressure and sore throat. ; ; Cardiovascular: Negative for chest pain, diaphoresis. +palpitations, +dyspnea, +peripheral edema. ; ; Respiratory: Negative for cough, wheezing and stridor. ; ; Gastrointestinal: Negative  for nausea, vomiting, diarrhea, abdominal pain, blood in stool, hematemesis, jaundice and rectal bleeding. . ; ; Genitourinary: Negative for dysuria, flank pain and hematuria. ; ; Musculoskeletal: Negative for back pain and neck pain. Negative for swelling and trauma.; ; Skin: Negative for pruritus, rash, abrasions, blisters, bruising and skin lesion.; ; Neuro: +lightheadedness/near syncope. Negative for headache, neck stiffness. Negative for weakness, altered level of consciousness , altered mental status, extremity weakness, paresthesias, involuntary movement, seizure and syncope.      Allergies  Penicillins  Home Medications   Prior to Admission medications   Medication Sig Start Date End Date Taking? Authorizing Provider  amLODipine (NORVASC) 10 MG tablet Take 1 tablet (10 mg total) by mouth daily. 08/12/13   [name], MD  aspirin 81 MG chewable tablet Chew 81 mg by mouth daily.     Historical Provider, MD  cetirizine (ZYRTEC) 10 MG tablet Take 1 tablet (10 mg total) by mouth daily. 07/19/13   [name], MD  colchicine 0.6 MG tablet Take one tablet 1-2 times daily. 01/26/14   [name], MD  Omega-3 Fatty Acids (FISH OIL) 1000 MG CAPS Take 2,000 mg by mouth daily. No directions given    Historical Provider, MD  omeprazole (PRILOSEC) 20 MG capsule TAKE ONE CAPSULE BY MOUTH TWICE DAILY 08/16/13  [name], MD  OVER THE COUNTER MEDICATION Take 2 tablets by mouth every 6 (six) hours as needed. Sinus headaches    Historical Provider, MD   BP 131/82  Pulse 60  Temp(Src) 97.7 F (36.5 C) (Oral)  Resp 20  SpO2 99%  10:57 Orthostatic Vital Signs Orthostatic Lying - BP- Lying: 129/82 mmHg ; Pulse- Lying: 76  Orthostatic Sitting - BP- Sitting: 141/102 mmHg ; Pulse- Sitting: 70  Orthostatic Standing at 0 minutes - BP- Standing at 0 minutes: 142/99 mmHg ; Pulse- Standing at 0 minutes: 77    Physical Exam 1005: Physical examination:  Nursing notes  reviewed; Vital signs and O2 SAT reviewed;  Constitutional: Well developed, Well nourished, Well hydrated, In no acute distress; Head:  Normocephalic, atraumatic; Eyes: EOMI, PERRL, No scleral icterus; ENMT: Mouth and pharynx normal, Mucous membranes moist; Neck: Supple, Full range of motion, No lymphadenopathy; Cardiovascular: Irregular irregular rate and rhythm, No gallop; Respiratory: Breath sounds coarse & equal bilaterally, No wheezes.  Speaking full sentences with ease, Normal respiratory effort/excursion; Chest: Nontender, Movement normal; Abdomen: Soft, Nontender, Nondistended, Normal bowel sounds; Genitourinary: No CVA tenderness; Extremities: Pulses normal, No tenderness, +1 pedal edema bilat without calf asymmetry.; Neuro: AA&Ox3, Major CN grossly intact.  Speech clear. No gross focal motor or sensory deficits in extremities.; Skin: Color normal, Warm, Dry.   ED Course  Procedures     EKG Interpretation   Date/Time:  Monday March 14 2014 09:51:22 EDT Ventricular Rate:  66 PR Interval:    QRS Duration: 99 QT Interval:  406 QTC Calculation: 425 R Axis:   35 Text Interpretation:  Artifact Atrial fibrillation Nonspecific ST and T  wave abnormality When compared with ECG of 06/01/2012 Atrial fibrillation  has replaced Normal sinus rhythm Confirmed by [facility]  MD, Nunzio Cory  [phone]) on 03/14/2014 10:14:23 AM      MDM  MDM Reviewed: previous chart, nursing note and vitals Reviewed previous: ECG and labs Interpretation: labs, ECG and x-ray    Results for orders placed during the hospital encounter of 03/14/14  CBC WITH DIFFERENTIAL      Result Value Ref Range   WBC 6.9  4.0 - 10.5 K/uL   RBC 5.18  4.22 - 5.81 MIL/uL   Hemoglobin 14.5  13.0 - 17.0 g/dL   HCT 44.1  39.0 - 52.0 %   MCV 85.1  78.0 - 100.0 fL   MCH 28.0  26.0 - 34.0 pg   MCHC 32.9  30.0 - 36.0 g/dL   RDW 13.0  11.5 - 15.5 %   Platelets 205  150 - 400 K/uL   Neutrophils Relative % 54  43 - 77 %   Neutro Abs  3.6  1.7 - 7.7 K/uL   Lymphocytes Relative 38  12 - 46 %   Lymphs Abs 2.7  0.7 - 4.0 K/uL   Monocytes Relative 7  3 - 12 %   Monocytes Absolute 0.5  0.1 - 1.0 K/uL   Eosinophils Relative 1  0 - 5 %   Eosinophils Absolute 0.1  0.0 - 0.7 K/uL   Basophils Relative 0  0 - 1 %   Basophils Absolute 0.0  0.0 - 0.1 K/uL  BASIC METABOLIC PANEL      Result Value Ref Range   Sodium 142  137 - 147 mEq/L   Potassium 4.2  3.7 - 5.3 mEq/L   Chloride 103  96 - 112 mEq/L   CO2 26  19 - 32 mEq/L  Glucose, Bld 101 (*) 70 - 99 mg/dL   BUN 10  6 - 23 mg/dL   Creatinine, Ser 0.90  0.50 - 1.35 mg/dL   Calcium 9.3  8.4 - 10.5 mg/dL   GFR calc non Af Amer >90  >90 mL/min   GFR calc Af Amer >90  >90 mL/min   Anion gap 13  5 - 15  PRO B NATRIURETIC PEPTIDE      Result Value Ref Range   Pro B Natriuretic peptide (BNP) 90.3  0 - 125 pg/mL  TROPONIN I      Result Value Ref Range   Troponin I <0.30  <0.30 ng/mL   Dg Chest 2 View 03/14/2014   CLINICAL DATA:  Cardiac arrhythmia  EXAM: CHEST  2 VIEW  COMPARISON:  June 01, 2012  FINDINGS: There is minimal scarring in the left base region. Elsewhere lungs are clear. Heart size and pulmonary vascularity are normal. No pneumothorax. No adenopathy.  IMPRESSION: Minimal scarring left base.  No edema or consolidation.   Electronically Signed   By: Lowella Grip M.D.   On: 03/14/2014 10:45    1150:  Pt is not orthostatic on VS. Pt ambulated with HR remaining 60-80's. New afib continues on monitor. Dx and testing d/w pt.  Questions answered.  Verb understanding, agreeable to eval by Cards and [facility] MD's while in the ED. T/C to [facility] Resident, case discussed, including:  HPI, pertinent PM/SHx, VS/PE, dx testing, ED course and treatment:  Agreeable to come to ED for evaluation for admission.    Francine Graven, DO 03/17/14 1526

## 2014-03-14 NOTE — H&P (Signed)
[facility] Admission H&P Service Pager: [phone]  Patient name: [name] Medical record number: [id number] Date of birth: [birth year] Age: 48 y.o. Gender: male  Primary Care Provider: Chrisandra Netters, MD Consultants: none (phone discussion with cardiology) Code Status: FULL  Chief Complaint: shortness of breath and palpitations  Assessment and Plan: [name] is a 48 y.o. male with presenting with palpitations (a "fluttering" in his chest) for the past 2+ weeks, persistent and worse for 2-3 days, found to be in previously-undiagnosed atrial fibrillation without RVR. PMH is significant for obesity, OSA, chronic back / shoulder pain, gout, HTN, HLD, GERD.   #New-onset atrial fib - no prior history, uncertain etiology or date of onset (at least a few days, possibly several weeks) - EKG nonischemic and troponin negative, CXR not suggestive of infiltrate or fluid overload; TSH normal - starting Eliquis 5 mg BID, continue previous ASA - ordered for echocardiogram - depending on echo results and/or any tachycardia, will consult cardiology inpt vs pt to see them outpt 9/9 - if tachycardic, will consider addition of metoprolol or other beta blocker - will stratify risk with A1c, lipid panel (see individual problems, below)  #Obesity / HLD / OSA - last lipid panel in Dec 2014; pt saw PCP in January this year and preferred at that time to make diet / exercise changes before starting statin - continue fish oil after D/C, regardless, if pt so chooses - repeat lipid panel and consider starting statin this admission, if pt agreeable / if indicated - consider sleep study outpt to see if he should be on CPAP  #HTN - mildly elevated, today, only on Norvasc 10 mg at home - consider other agents if persistently elevated +/- beta blocker for rate control if needed, as above - could consider changing away from Norvasc if echo comes back with reduced EF  #GERD - on  Prilosec at home, sub Protonix here; no current symptoms specifically suggestive of poor control  #Gout - per history; no active flare, currently, with colchicine only as needed at home - not on allopurinol or Probenecid at home; monitor for flare  #Chronic pain - some complaint of back pain, at baseline (?history of a "torn muscle"  - no chronic narcotics at home and pain is a secondary complaint without request for pain medication, currently - Tylenol PRN, consider tramadol before stronger narcotics  FEN/GI: heart healthy diet, saline lock IV Prophylaxis: on full-dose anticoagulation with Eliquis, as above  Disposition: place in observation, management as above, attending Dr. Andria Frames; anticipate D/C and PCP + cardiology follow-up pending results of echocardiogram and stability of rate in afib  History of Present Illness: [name] is a 48 y.o. male presenting with palpitations (a "fluttering" in his chest) for the past 2+ weeks, persistent and worse for 2-3 days. PMH is significant for obesity, OSA, chronic back / shoulder pain, gout, HTN, HLD, GERD but he takes medications only for some of these issues.   He reports shortness of breath with activity (pt works at [employer] with a very physical job), worse for the past several days with his heart palpitations, with occasional dizziness, as well. His symptoms are worse with strenuous activity (i.e., at work). Pt does not however have frank chest pain, cough, productive sputum, N/V/D, fainting or near-syncope. He does think he has some mild LE swelling. He has not taken any specific medications for his current symptoms and denies known history of cardiac problems (per chart review, he had  chest pain and a low-risk-result Myoview about 3 years ago).  Of note, pt has not been seen in clinic for these acute issues. He was last seen in clinic in January for HTN, HLD, and LE pain.  Review Of Systems: Per HPI. Otherwise 12 point review of systems  was performed and was unremarkable.  Patient Active Problem List   Diagnosis Date Noted  . Right knee pain 11/10/2012  . Sinusitis, acute 10/23/2012  . Lateral epicondylitis  of elbow 04/13/2012  . Intermittent drowsiness 11/25/2011  . Left hip pain 06/17/2011  . OSA (obstructive sleep apnea) 05/17/2011  . Chronic back pain 10/29/2010  . INSOMNIA 09/19/2008  . GOUT 05/13/2007  . Hyperlipidemia 10/02/2006  . OBESITY, NOS 10/02/2006  . MIGRAINE, UNSPEC., W/O INTRACTABLE MIGRAINE 10/02/2006  . HYPERTENSION, BENIGN SYSTEMIC 10/02/2006  . GASTROESOPHAGEAL REFLUX, NO ESOPHAGITIS 10/02/2006   Past Medical History: Past Medical History  Diagnosis Date  . Arthritis   . 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 History of MRI of cervical spine 09/25/10    normal; left shoulder normal as well  . Obstructive sleep apnea of adult 04/29/2011    moderate on CPAP  . Gout   . Chronic knee pain   . Chronic foot pain   . Hypertension   . Chronic elbow pain   . Chronic shoulder pain   . Chronic hip pain   . Headache    Past Surgical History: Past Surgical History  Procedure Laterality Date  . Knee surgery      Right Knee   Social History: History  Substance Use Topics  . Smoking status: Former Smoker    Types: Cigarettes    Quit date: 04/02/1994  . Smokeless tobacco: Never Used  . Alcohol Use: No   Please also refer to relevant sections of EMR.  Family History: No family history on file. Allergies and Medications: Allergies  Allergen Reactions  . Penicillins Other (See Comments)    Makes joints stiff and unable to move   No current facility-administered medications on file prior to encounter.   Current Outpatient Prescriptions on File Prior to Encounter  Medication Sig Dispense Refill  . amLODipine (NORVASC) 10 MG tablet Take 1 tablet (10 mg total) by mouth daily.  30 tablet  3  . aspirin 81 MG chewable  tablet Chew 81 mg by mouth daily.       . Omega-3 Fatty Acids (FISH OIL) 1000 MG CAPS Take 2,000 mg by mouth daily.         Objective: BP 135/73  Pulse 71  Temp(Src) 97.7 F (36.5 C) (Oral)  Resp 18  SpO2 99%  Orthostatic vitals in the ED: (negative for orthostasis) Orthostatic Lying - BP: 129/82 mmHg ; Pulse: 76  Orthostatic Sitting - BP: 141/102 mmHg ; Pulse: 70  Orthostatic Standing - BP: 142/99 mmHg ; Pulse: 77  Exam: General: adult male, lying in bed, in NAD; obese HEENT: [place]/AT, EOMI, PERRLA, MMM, TM's clear bilaterally Neck: supple, no lymphadenopathy Cardiovascular: irregularly irregular, normal rate, no murmur identified Respiratory: CTAB, no wheezes, mildly increased WOB Abdomen: soft, nontender, BS+ Ext: warm, well-perfused, LE bilaterally with 1+ pitting at the ankles; distal pulses intact / symmetric Neuro: alert / oriented, normal strength / sensation throughout, able to stand / ambulate without assistance Skin: warm, dry, intact, no rashes  Labs and Imaging: CBC BMET   Recent Labs Lab 03/14/14 1014  WBC 6.9  HGB 14.5  HCT 44.1  PLT 205    Recent Labs Lab 03/14/14 1014  NA 142  K 4.2  CL 103  CO2 26  BUN 10  CREATININE 0.90  GLUCOSE 101*  CALCIUM 9.3      Recent Labs Lab 03/14/14 1014  TROPONINI <0.30   Pro-BNP 90.3  CXR 8/10 @1032 : no acute consolidation or effusion ED EKG: baseline artrate 66, nonspecific ST / T changes, irregularly irregular  Emmaline Kluver, MD 03/14/2014, 1:50 PM PGY-3, [place] Intern pager: [phone], text pages welcome

## 2014-03-14 NOTE — H&P (Signed)
Discussed with Dr. Venetia Maxon.  Agree with observation and his management and documentation.  Briefly, 48 yo male with recent onset of a fib (likely greater than 48 hours - really unknown duration) who is rate controled despite no meds.  Presents with palpitations and dyspnea.  We need to gather some basic data (echo, TSH, etc)  Likely can be [facility] tomorrow on anticoag with outpatient cards FU

## 2014-03-14 NOTE — ED Notes (Signed)
MD at bedside. Dr. McManus. 

## 2014-03-14 NOTE — Progress Notes (Addendum)
Requested to make a new patient appointment for evaluation of atrial fib. First available is Dr. Acie Fredrickson at September 9 th at 9:00 am. Put info on d/c sheet.

## 2014-03-14 NOTE — ED Notes (Addendum)
For the past few weeks at work I've been so drowsy and so dizzy. Pt states he feels faint often. Felt his heart fluttering back in June. Saturday, pt states his heart started fluttering and progressively kept getting worse.   81 mg ASA PO 0730 SL Nitro X2

## 2014-03-15 DIAGNOSIS — I517 Cardiomegaly: Secondary | ICD-10-CM

## 2014-03-15 DIAGNOSIS — G4733 Obstructive sleep apnea (adult) (pediatric): Secondary | ICD-10-CM | POA: Diagnosis not present

## 2014-03-15 DIAGNOSIS — I4891 Unspecified atrial fibrillation: Secondary | ICD-10-CM | POA: Diagnosis not present

## 2014-03-15 DIAGNOSIS — I499 Cardiac arrhythmia, unspecified: Secondary | ICD-10-CM | POA: Diagnosis not present

## 2014-03-15 DIAGNOSIS — M129 Arthropathy, unspecified: Secondary | ICD-10-CM | POA: Diagnosis not present

## 2014-03-15 LAB — CBC
HCT: 42.4 % (ref 39.0–52.0)
Hemoglobin: 14.4 g/dL (ref 13.0–17.0)
MCH: 28.2 pg (ref 26.0–34.0)
MCHC: 34 g/dL (ref 30.0–36.0)
MCV: 83 fL (ref 78.0–100.0)
Platelets: 221 10*3/uL (ref 150–400)
RBC: 5.11 MIL/uL (ref 4.22–5.81)
RDW: 13 % (ref 11.5–15.5)
WBC: 8.4 10*3/uL (ref 4.0–10.5)

## 2014-03-15 LAB — BASIC METABOLIC PANEL
ANION GAP: 13 (ref 5–15)
BUN: 12 mg/dL (ref 6–23)
CALCIUM: 9.1 mg/dL (ref 8.4–10.5)
CHLORIDE: 104 meq/L (ref 96–112)
CO2: 25 meq/L (ref 19–32)
CREATININE: 0.97 mg/dL (ref 0.50–1.35)
GFR calc non Af Amer: >90 mL/min (ref >90)
Glucose, Bld: 109 mg/dL — ABNORMAL HIGH (ref 70–99)
Potassium: 3.9 mEq/L (ref 3.7–5.3)
SODIUM: 142 meq/L (ref 137–147)

## 2014-03-15 MED ORDER — ATORVASTATIN CALCIUM 40 MG PO TABS: 40.0000 mg | ORAL_TABLET | Freq: Every day | ORAL | Status: DC

## 2014-03-15 MED ORDER — GUAIFENESIN ER 600 MG PO TB12
1200.0000 mg | ORAL_TABLET | Freq: Two times a day (BID) | ORAL | Status: DC
Start: 2014-03-15 — End: 2014-03-15

## 2014-03-15 MED ORDER — ATORVASTATIN CALCIUM 40 MG PO TABS
40.0000 mg | ORAL_TABLET | Freq: Every day | ORAL | Status: DC
  Filled 2014-03-15: qty 1

## 2014-03-15 MED ORDER — AMLODIPINE BESYLATE 10 MG PO TABS: 10.0000 mg | ORAL_TABLET | Freq: Every day | ORAL | Status: DC

## 2014-03-15 MED ORDER — APIXABAN 5 MG PO TABS: 5.0000 mg | ORAL_TABLET | Freq: Two times a day (BID) | ORAL | Status: DC

## 2014-03-15 NOTE — Discharge Summary (Signed)
[facility] Discharge Summary  Patient name: [name] Medical record number: [id number] Date of birth: [birth year] Age: 48 y.o. Gender: male Date of Admission: 03/14/2014  Date of Discharge: 03/15/14 Admitting Physician: Zigmund Gottron, MD  Primary Care Provider: Chrisandra Netters, MD Consultants: Cardiology  Indication for Hospitalization: New onset atrial fib  Discharge Diagnoses/Problem List:  New onset a-fib Obesity HTN GERD Gout Chronic Pain HLD  Disposition: Patient to be discharged home.  Discharge Condition: stable.  Brief Hospital Course:  HPI:  [name] is a 48 y.o. male presenting with palpitations (a "fluttering" in his chest) for the past 2+ weeks, persistent and worse for 2-3 days. PMH is significant for obesity, OSA, chronic back / shoulder pain, gout, HTN, HLD, GERD but he takes medications only for some of these issues.  He reports shortness of breath with activity (pt works at [employer] with a very physical job), worse for the past several days with his heart palpitations, with occasional dizziness, as well. His symptoms are worse with strenuous activity (i.e., at work). Pt does not however have frank chest pain, cough, productive sputum, N/V/D, fainting or near-syncope. He does think he has some mild LE swelling. He has not taken any specific medications for his current symptoms and denies known history of cardiac problems (per chart review, he had chest pain and a low-risk-result Myoview about 3 years ago).   New onset a-fib: Patient had no prior history of a-fib.  EKG showed no ischemic changes and revealed an irregularly irregular rhythm with no descernable p-waves at a HR of 66bpm.  Troponins were negative.  CXR had no evidence of infiltrate or fluid overload and TSH was normal.  Patient was previously on asprin and was started on Eliquis 5mg .  An echo was ordered and revealed an EF of 55-60% with no significant valvular  abnormalities.   Patient converted spontaneously to NSR overnight and was discharged home with medication and a f/u visit with Dr Acie Fredrickson.   Obesity: Heart healthy diet during hospitalization.  Lipid panel obtained.  Patient hyperlipidemic.  ASCVD risk 5.9%.  Patient not agreeable to medication intervention at this time.  However, an rx was provided to patient upon discharge for Lipitor 40mg .  HTN: Patient remained normotensive during hospitalization.  Home Norvasc was continued.  GERD:  Patient given Protonix during hospitalization.  Patient to continue home Prilosec.  Gout:  Did not have an active flare. Controlled with colchicine.  Chronic pain: H/O Back pain.  Patient was controlled on Tylenol PRN.  No active pain meds at home.  Issues for Follow Up:  Patient to f/u with cardiology for new medication management and evaluation.  Significant Procedures: none  Significant Labs and Imaging:   Recent Labs Lab 03/14/14 1014 03/15/14 0420  WBC 6.9 8.4  HGB 14.5 14.4  HCT 44.1 42.4  PLT 205 221    Recent Labs Lab 03/14/14 1014 03/14/14 1535 03/15/14 0420  NA 142  --  142  K 4.2  --  3.9  CL 103  --  104  CO2 26  --  25  GLUCOSE 101*  --  109*  BUN 10  --  12  CREATININE 0.90  --  0.97  CALCIUM 9.3  --  9.1  MG  --  2.0  --     Dg Chest 2 View  03/14/2014   CLINICAL DATA:  Cardiac arrhythmia  EXAM: CHEST  2 VIEW  COMPARISON:  June 01, 2012  FINDINGS:  There is minimal scarring in the left base region. Elsewhere lungs are clear. Heart size and pulmonary vascularity are normal. No pneumothorax. No adenopathy.  IMPRESSION: Minimal scarring left base.  No edema or consolidation.   Electronically Signed   By: Lowella Grip M.D.   On: 03/14/2014 10:45   03/15/14 Echo: Normal LV size with mild LV hypertrophy. EF 55-60% with moderate diastolic dysfunction. Mildly dilated RV with normal systolic function. No significant valvular abnormalities.   Results/Tests Pending at  Time of Discharge: none  Discharge Medications:    Medication List         amLODipine 10 MG tablet  Commonly known as:  NORVASC  Take 1 tablet (10 mg total) by mouth daily.     apixaban 5 MG Tabs tablet  Commonly known as:  ELIQUIS  Take 1 tablet (5 mg total) by mouth 2 (two) times daily.     aspirin 81 MG chewable tablet  Chew 81 mg by mouth daily.     atorvastatin 40 MG tablet  Commonly known as:  LIPITOR  Take 1 tablet (40 mg total) by mouth daily at 6 PM.     colchicine 0.6 MG tablet  Take 0.6 mg by mouth 2 (two) times daily as needed (gout).     Fish Oil 1000 MG Caps  Take 2,000 mg by mouth daily.     ibuprofen 200 MG tablet  Commonly known as:  ADVIL,MOTRIN  Take 800 mg by mouth every 8 (eight) hours as needed (pain).     omeprazole 20 MG capsule  Commonly known as:  PRILOSEC  Take 20 mg by mouth 2 (two) times daily.        Discharge Instructions: Please refer to Patient Instructions section of EMR for full details.  Patient was counseled important signs and symptoms that should prompt return to medical care, changes in medications, dietary instructions, activity restrictions, and follow up appointments.   Follow-Up Appointments: Follow-up Information   Follow up with Darden Amber., MD On 04/13/2014. (9:00 appointment, please arrive about 15 minutes early for paperwork.)    Specialty:  Cardiology   Contact information:   [address] [address] [phone]       Follow up with Lupita Dawn, MD On 03/22/2014. (10:15am)    Specialty:  [facility]   Contact information:   [place] [address] [place], DO 03/15/2014, 3:37 PM PGY-1, [place]

## 2014-03-15 NOTE — Progress Notes (Signed)
Family Medicine MD on call notified that pt's hr goes up to the 140s non-sustaining when he gets up & goes to the restroom. Pt does have SOB at times. Will continue to monitor the pt. Hoover Brunette, RN

## 2014-03-15 NOTE — Progress Notes (Signed)
Md on call notified that the pt spontaneously converted to sinus rhythm around 0600 this am. EKG confirmed the rhythm & is in the chart. Will continue to monitor the pt. Hoover Brunette, RN

## 2014-03-15 NOTE — Progress Notes (Signed)
UR Completed.  Terrelle Ruffolo Jane [phone] 03/15/2014  

## 2014-03-15 NOTE — Progress Notes (Signed)
  Echocardiogram 2D Echocardiogram has been performed.  [name] 03/15/2014, 11:23 AM

## 2014-03-15 NOTE — Progress Notes (Signed)
Seen and examined.  Agree with documentation and management of Dr.Gottschalk.  [name] is back in [place].  OK to DC today on anticoag for PAF.  He will need cards outpatient follow up.

## 2014-03-15 NOTE — Discharge Instructions (Signed)
[name], thank you so much for allowing [place] to care for you during your stay at [facility].  You were admitted with new onset atrial fibrillation.  We are glad to see you are doing better!  Please continue to take your Eliquis as directed and please remember to follow up with your PCP and cardiologist.  Appointments have been made for you and dates are in this packet.  Again, thank you for allowing [place] to care for you!    Information on my medicine - ELIQUIS (apixaban)  This medication education was reviewed with me or my healthcare representative as part of my discharge preparation.  The pharmacist that spoke with me during my hospital stay was:  McCallister, Maud Deed, [facility]  Why was Eliquis prescribed for you? Eliquis was prescribed for you to reduce the risk of a blood clot forming that can cause a stroke if you have a medical condition called atrial fibrillation (a type of irregular heartbeat).  What do You need to know about Eliquis ? Take your Eliquis TWICE DAILY - one tablet in the morning and one tablet in the evening with or without food. If you have difficulty swallowing the tablet whole please discuss with your pharmacist how to take the medication safely.  Take Eliquis exactly as prescribed by your doctor and DO NOT stop taking Eliquis without talking to the doctor who prescribed the medication.  Stopping may increase your risk of developing a stroke.  Refill your prescription before you run out.  After discharge, you should have regular check-up appointments with your healthcare provider that is prescribing your Eliquis.  In the future your dose may need to be changed if your kidney function or weight changes by a significant amount or as you get older.  What do you do if you miss a dose? If you miss a dose, take it as soon as you remember on the same day and resume taking twice daily.  Do not take more than one dose of ELIQUIS at the same time to make up a missed dose.  Important  Safety Information A possible side effect of Eliquis is bleeding. You should call your healthcare provider right away if you experience any of the following:   Bleeding from an injury or your nose that does not stop.   Unusual colored urine (red or dark brown) or unusual colored stools (red or black).   Unusual bruising for unknown reasons.   A serious fall or if you hit your head (even if there is no bleeding).  Some medicines may interact with Eliquis and might increase your risk of bleeding or clotting while on Eliquis. To help avoid this, consult your healthcare provider or pharmacist prior to using any new prescription or non-prescription medications, including herbals, vitamins, non-steroidal anti-inflammatory drugs (NSAIDs) and supplements.  This website has more information on Eliquis (apixaban): http://www.eliquis.com/eliquis/home

## 2014-03-15 NOTE — Progress Notes (Signed)
Family Medicine Teaching Service Daily Progress Note Intern Pager: [phone]  Patient name: [name] Medical record number: [id number] Date of birth: [birth year] Age: 48 y.o. Gender: male  Primary Care Provider: Chrisandra Netters, MD Consultants: none Code Status: FULL  Pt Overview and Major Events to Date:  08/11 Patient converted to NSR spontaneously overnight.  Assessment and Plan:  Cedrick Partain is a 48 y.o. male with presenting with palpitations (a "fluttering" in his chest) for the past 2+ weeks, persistent and worse for 2-3 days, found to be in previously-undiagnosed atrial fibrillation without RVR. PMH is significant for obesity, OSA, chronic back / shoulder pain, gout, HTN, HLD, GERD.   1. New-onset atrial fib - no prior history, uncertain etiology or date of onset (at least a few days, possibly several weeks)  - EKG 8/11: NSR  - EKG 8/10 nonischemic and troponin negative, CXR not suggestive of infiltrate or fluid overload; TSH normal  - starting Eliquis 5 mg BID, continue previous ASA  - Awaiting echocardiogram, depending on echo results will consult cardiology inpt vs pt to see them outpt 9/9  - Continue to consider addition of metoprolol or other beta blocker  - will stratify risk with A1c 5.9 - Lipid panel below  2. Obesity / HLD / OSA  - last lipid panel in Dec 2014; pt saw PCP in January this year and preferred at that time to make diet / exercise changes before starting statin  - continue fish oil after D/C, regardless, if pt so chooses  - ASCVD risk: 5.9%, Started Lipitor 40mg  - on CPAP here and at home   3. HTN - BP 119/63 only on Norvasc 10 mg at home  - consider other agents if persistently elevated +/- beta blocker for rate control if needed, as above  - could consider changing away from Norvasc if echo comes back with reduced EF   4. GERD - on Prilosec at home, sub Protonix here; no current symptoms specifically suggestive of poor control   5. Gout - per  history; no active flare, currently, with colchicine only as needed at home  - not on allopurinol or Probenecid at home; monitor for flare   6. Chronic pain - some complaint of back pain, at baseline ?history of a "torn muscle"  - no chronic narcotics at home and pain is a secondary complaint without request for pain medication, currently  - Tylenol PRN   FEN/GI: heart healthy diet, saline lock IV  Prophylaxis: on full-dose anticoagulation with Eliquis, as above   Disposition: Plan to d/c today pending echo.  Subjective:  Patient states that he is doing well and is ready to go home. Denies CP, SOB, N/V, abdominal pain, or headache.  Objective: Temp:  [97.7 F (36.5 C)-98.3 F (36.8 C)] 97.7 F (36.5 C) (08/11 0436) Pulse Rate:  [60-87] 84 (08/11 0436) Resp:  [13-20] 18 (08/11 0436) BP: (112-137)/(62-93) 119/63 mmHg (08/11 0436) SpO2:  [96 %-99 %] 98 % (08/11 0436) Weight:  [316 lb 3.2 oz (143.427 kg)] 316 lb 3.2 oz (143.427 kg) (08/11 0436) Physical Exam: General: obese male, resting in bed, NAD, pleasant Cardiovascular: S1S2 RRR, no murmurs appreciated Respiratory: CTAB, no wheeze/rhonchi Abdomen: obese, soft, NT/ND, +BS x4 Extremities: WWP, no edema  Laboratory:  Recent Labs Lab 03/14/14 1014 03/15/14 0420  WBC 6.9 8.4  HGB 14.5 14.4  HCT 44.1 42.4  PLT 205 221    Recent Labs Lab 03/14/14 1014 03/15/14 0420  NA 142 142  K 4.2 3.9  CL 103 104  CO2 26 25  BUN 10 12  CREATININE 0.90 0.97  CALCIUM 9.3 9.1  GLUCOSE 101* 109*   Lipid Panel     Component Value Date/Time   CHOL 187 03/14/2014 1535   TRIG 226* 03/14/2014 1535   HDL 30* 03/14/2014 1535   CHOLHDL 6.2 03/14/2014 1535   VLDL 45* 03/14/2014 1535   LDLCALC 112* 03/14/2014 1535     Imaging/Diagnostic Tests: Echo 8/11: pending  Janora Norlander, DO 03/15/2014, 7:27 AM PGY-1, [place] Intern pager: [phone], text pages welcome

## 2014-03-15 NOTE — Care Management Note (Addendum)
    Page 1 of 2   03/15/2014     2:36:59 PM CARE MANAGEMENT NOTE 03/15/2014  Patient:  [name],[name]   Account Number:  [id number]  Date Initiated:  03/15/2014  Documentation initiated by:  GRAVES-BIGELOW,Debhora Titus  Subjective/Objective Assessment:   Pt in iwth new onset afib. Initiated on eliquis.     Action/Plan:   Benefits check completed. Will make pt aware of cost and provide 30 day free card with co pay card.   Anticipated DC Date:  03/16/2014   Anticipated DC Plan:  [place]  CM consult      Choice offered to / List presented to:             Status of service:  Completed, signed off Medicare Important Message given?  NO (If response is "NO", the following Medicare IM given date fields will be blank) Date Medicare IM given:   Medicare IM given by:   Date Additional Medicare IM given:   Additional Medicare IM given by:    Discharge Disposition:    Per UR Regulation:    If discussed at Long Length of Stay Meetings, dates discussed:    Comments:    03-15-14 [address] Jacqlyn Krauss, RN,BSN [phone] CM did call [facility] [place] and medication eliquis is available. No further needs from CM at this time.     S/W [facility] @ [place] [facility]  # [phone]  APIXABAN  ( ELIQUIS ) 5MG   BID  COVER -YES CO-PAY- $ 50.00 FOR 34 DAYS SUPPLY                $ 100.0 FOR 35-68 DAYS  SUPPLY                $ 150.00 FOR 69-102 DAYS SUPPLY  MAIL-ORDER CO-PAY - $ 33.33  FOR 34 DAY SUPPLY                 $ 66.66 FOR 35-68 DAYS SUPPLY                 $ 150.00 FOR 36-102 DAYS SUPPLY PRIOR APPROVAL - NO PHARMACY: STANDARD

## 2014-03-16 ENCOUNTER — Telehealth: Payer: Self-pay | Admitting: Family Medicine

## 2014-03-16 ENCOUNTER — Encounter: Payer: Self-pay | Admitting: Family Medicine

## 2014-03-16 NOTE — Telephone Encounter (Signed)
Left message informing patient that the letter he requested is at the front desk for him to pick up.Sarah Baez, Kevin Fenton

## 2014-03-16 NOTE — Discharge Summary (Signed)
Seen and examined on the day of DC.  Agree with Dr. Marjean Donna documentation and management.

## 2014-03-16 NOTE — Telephone Encounter (Signed)
Letter written and will place at front for pt to pick up when convenient. Please inform.  [name], MD

## 2014-03-16 NOTE — Telephone Encounter (Signed)
Needs a drs note stating pt can return to work with no restrictions. Wife doris Tyree is in office now and can get the letter

## 2014-03-22 ENCOUNTER — Encounter: Payer: Self-pay | Admitting: Family Medicine

## 2014-03-22 ENCOUNTER — Ambulatory Visit (INDEPENDENT_AMBULATORY_CARE_PROVIDER_SITE_OTHER): Payer: 59 | Admitting: Family Medicine

## 2014-03-22 VITALS — BP 120/81 | HR 70 | Temp 97.9°F | Wt 314.0 lb

## 2014-03-22 DIAGNOSIS — M109 Gout, unspecified: Secondary | ICD-10-CM

## 2014-03-22 DIAGNOSIS — I10 Essential (primary) hypertension: Secondary | ICD-10-CM

## 2014-03-22 DIAGNOSIS — E785 Hyperlipidemia, unspecified: Secondary | ICD-10-CM

## 2014-03-22 DIAGNOSIS — I4891 Unspecified atrial fibrillation: Secondary | ICD-10-CM

## 2014-03-22 NOTE — Assessment & Plan Note (Signed)
ASCVD risk score of 5.1%. Started on Lipitor in hospital however patient has not been taking. Recommended that patient be on moderate intensity statin however he wishes to pursue lifestyle modifications. -patient to discuss with cardiology as well

## 2014-03-22 NOTE — Progress Notes (Signed)
   Subjective:    Patient ID: [name], male    DOB: [birth year], 48 y.o.   MRN: [id number]  HPI 48 y/o male presents for hospital follow up. Admitted to [facility] from 03/14/14 - 03/15/14 for new onset atrial fibrillation.   Afib - patient started on Eliquis, no bleeding, no further episodes of heat fluttering/palpitaitons, no lightheadedness, no syncope, no chest pain, has follow up with Cardiology on 04/13/14.  HLD - started on Lipitor during hospitalization, has not been taking, patient states that he has since started to work out regularly, has been going to [employer] 3 times per weeks (does mostly cardio), he would like to attempt trial of lifestyle modifications  Gout - patient noted that he has not been taking Allopurinol for the past few months, no gout flare in over 3 months, has not needed PRN colchicine  Leg swelling - chronic, no changes recently, states that leg swelling was present before addition of Amlodipine to regimen  Social - former smoker   Review of Systems  Constitutional: Negative for fever, chills and fatigue.  Respiratory: Negative for cough and shortness of breath.   Cardiovascular: Positive for leg swelling. Negative for chest pain.   Reports occasional back muscle spasm, has been present for over 2 years, previously relieved with home exercises    Objective:   Physical Exam Vitals: reviewed Gen: pleasant male, NAD HEENT: normocephalic, PERRL, EOMI, no scleral icterus, MMM Cardiac: RRR, S1 and S2 present, no murmurs, no heaves/thrills Resp: CTAB, normal effort Ext: 1+ edema of bilatearal LE Vascular: 2+ radial pulses bilaterally  Echo - EF 42-35%, grade 2 diastolic dysfunction    Assessment & Plan:  Please see problem specific assessment and plan.

## 2014-03-22 NOTE — Assessment & Plan Note (Signed)
Hospitalized 03/14/14 - 03/15/14 for new onset Afib. Echo showed grade 2 diastolic dysfunction. Started on Eliquis 5 mg BID. CHADS2 score of 2. No further palpitations. Currently NSR. -continue Eliquis -follow up with Cardiology as scheduled on 04/13/14

## 2014-03-22 NOTE — Assessment & Plan Note (Signed)
Well controlled on current regimen. -No changes made today but may consider switching to BB since new diagnosis of diastolic CHF

## 2014-03-22 NOTE — Patient Instructions (Signed)
Atrial Fibrillation - continue blood thinner, keep follow up with Cardiology  Cholesterol - relatively well controlled, I would recommend that you start to take Lipitor but please discuss with the heart doctor as well.

## 2014-03-22 NOTE — Assessment & Plan Note (Signed)
Patient stable off Allopurinol for 3 months. -monitor clinically

## 2014-03-31 ENCOUNTER — Other Ambulatory Visit: Payer: Self-pay | Admitting: Family Medicine

## 2014-04-13 ENCOUNTER — Other Ambulatory Visit: Payer: Self-pay | Admitting: Cardiovascular Disease

## 2014-04-13 ENCOUNTER — Encounter: Payer: Self-pay | Admitting: Cardiovascular Disease

## 2014-04-13 ENCOUNTER — Ambulatory Visit (INDEPENDENT_AMBULATORY_CARE_PROVIDER_SITE_OTHER): Payer: 59 | Admitting: Cardiovascular Disease

## 2014-04-13 VITALS — BP 120/82 | HR 58 | Ht 74.0 in | Wt 321.2 lb

## 2014-04-13 DIAGNOSIS — E785 Hyperlipidemia, unspecified: Secondary | ICD-10-CM

## 2014-04-13 DIAGNOSIS — I10 Essential (primary) hypertension: Secondary | ICD-10-CM

## 2014-04-13 DIAGNOSIS — I480 Paroxysmal atrial fibrillation: Secondary | ICD-10-CM

## 2014-04-13 DIAGNOSIS — I4891 Unspecified atrial fibrillation: Secondary | ICD-10-CM

## 2014-04-13 NOTE — Progress Notes (Signed)
[name] Date of Birth  [birth year]       [facility] Office [address], [address], [place], [place]  [address]   [place], [place]  [address] [place]   Fax  [phone]     Fax [phone]  Problem List: 1. Atrial fibrillation 2. Hypertension 3. Gout 4. Gastroesophageal reflux disease 5. Obstructive sleep apnea 6. Hyperlipidemia  History of Present Illness: [name] is a 48 year old gentleman who was recently admitted to [facility] with an episode of rapid atrial fibrillation.  He converted on a diltiazem drip.  He did well until this past Saturday when He had another episode of atrial fibrillation.  He does not drink any alcohol.  Does not smoke cigarettes.  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  Echo : Left ventricle: The cavity size was normal. Wall thickness was increased in a pattern of mild LVH. Systolic function was normal. The estimated ejection fraction was in the range of 55% to 60%. Wall motion was normal; there were no regional wall motion abnormalities. Features are consistent with a pseudonormal left ventricular filling pattern, with concomitant abnormal relaxation and increased filling pressure (grade 2 diastolic dysfunction). - Aortic valve: There was no stenosis. - Mitral valve: There was no significant regurgitation. - Left atrium: The atrium was mildly dilated. - Right ventricle: The cavity size was mildly dilated. Systolic function was normal. - Right atrium: The atrium was mildly dilated. - Pulmonary arteries: No complete TR doppler jet so unable to estimate PA systolic pressure. - Systemic veins: IVC measured 2.1 cm with normal respirophasic variation, suggesting RA pressure 8 mmHg  He has done well.  He has joined the gym and   Current Outpatient Prescriptions on File Prior to Visit  Medication Sig Dispense Refill  . allopurinol (ZYLOPRIM) 300 MG tablet  Take 300 mg by mouth.      Marland Kitchen amLODipine (NORVASC) 10 MG tablet Take 1 tablet (10 mg total) by mouth daily.  30 tablet  3  . apixaban (ELIQUIS) 5 MG TABS tablet Take 1 tablet (5 mg total) by mouth 2 (two) times daily.  60 tablet  0  . aspirin 81 MG chewable tablet Chew 81 mg by mouth daily.       . colchicine 0.6 MG tablet Take 0.6 mg by mouth 2 (two) times daily as needed (gout).      . nitroGLYCERIN (NITROSTAT) 0.4 MG SL tablet Place 0.4 mg under the tongue.      . Omega-3 Fatty Acids (FISH OIL) 1000 MG CAPS Take 2,000 mg by mouth daily.       Marland Kitchen omeprazole (PRILOSEC) 20 MG capsule TAKE ONE CAPSULE BY MOUTH TWICE DAILY  60 capsule  5   No current facility-administered medications on file prior to visit.    Allergies  Allergen Reactions  . Penicillins Other (See Comments)    Makes joints stiff and unable to move    Past Medical History  Diagnosis Date  . Arthritis   . 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 History of MRI of cervical spine 09/25/10    normal; left shoulder normal as well  . Obstructive sleep apnea of adult 04/29/2011    moderate on CPAP  . Gout   . Chronic knee pain   . Chronic foot pain   .  Hypertension   . Chronic elbow pain   . Chronic shoulder pain   . Chronic hip pain   . Headache     Past Surgical History  Procedure Laterality Date  . Knee surgery      Right Knee    History  Smoking status  . Former Smoker  . Types: Cigarettes  . Quit date: 04/02/1994  Smokeless tobacco  . Never Used    History  Alcohol Use No    No family history on file.  Reviw of Systems:  Reviewed in the HPI.  All other systems are negative.  Physical Exam: Blood pressure 120/82, pulse 58, height 6\' 2"  (1.88 m), weight 321 lb 3.2 oz (145.695 kg). Wt Readings from Last 3 Encounters:  04/13/14 321 lb 3.2 oz (145.695 kg)  03/22/14 314 lb (142.429 kg)  03/15/14 316 lb 3.2 oz (143.427 kg)      General: Well developed, well nourished, in no acute distress.  Head: Normocephalic, atraumatic, sclera non-icteric, mucus membranes are moist,   Neck: Supple. Carotids are 2 + without bruits. No JVD   Lungs: Clear   Heart: RR, bradycardia  Abdomen: Soft, non-tender, non-distended with normal bowel sounds.  Msk:  Strength and tone are normal   Extremities: No clubbing or cyanosis. No edema.  Distal pedal pulses are 2+ and equal    Neuro: CN II - XII intact.  Alert and oriented X 3.   Psych:  Normal   ECG: Sept. 9, 2015:  Sinus brady at 58.    Assessment / Plan:

## 2014-04-13 NOTE — Assessment & Plan Note (Addendum)
[name] Is doing well. He converted to sinus rhythm during his hsopitalization.  He remains in NSR His recession for atrial fibrillation include obstructive sleep apnea, hypertension, and obesity.  Hi his CHADS VASC score is ~ 2 ( LVH, HTN).  I have encouraged him to work on the diet, exercise, and weight loss program. I suspect that he needs to lose around 100 pounds. I will see him again in 6 months for followup visit.

## 2014-04-13 NOTE — Patient Instructions (Signed)
Your physician recommends that you continue on your current medications as directed. Please refer to the Current Medication list given to you today.  Your physician wants you to follow-up in: 6 months with Dr. Nahser.  You will receive a reminder letter in the mail two months in advance. If you don't receive a letter, please call our office to schedule the follow-up appointment.  

## 2014-04-15 ENCOUNTER — Other Ambulatory Visit (HOSPITAL_COMMUNITY): Payer: Self-pay | Admitting: Family Medicine

## 2014-06-16 ENCOUNTER — Encounter: Payer: Self-pay | Admitting: Family Medicine

## 2014-06-16 ENCOUNTER — Ambulatory Visit (INDEPENDENT_AMBULATORY_CARE_PROVIDER_SITE_OTHER): Payer: 59 | Admitting: Family Medicine

## 2014-06-16 ENCOUNTER — Ambulatory Visit (HOSPITAL_COMMUNITY)
Admission: RE | Admit: 2014-06-16 | Discharge: 2014-06-16 | Disposition: A | Discharge status: Home / Self Care | Payer: 59 | Source: Ambulatory Visit | Attending: Family Medicine | Admitting: Family Medicine

## 2014-06-16 VITALS — BP 135/78 | HR 72 | Temp 98.0°F | Ht 74.0 in | Wt 329.9 lb

## 2014-06-16 DIAGNOSIS — R079 Chest pain, unspecified: Secondary | ICD-10-CM | POA: Insufficient documentation

## 2014-06-16 DIAGNOSIS — J0111 Acute recurrent frontal sinusitis: Secondary | ICD-10-CM

## 2014-06-16 MED ORDER — CETIRIZINE HCL 10 MG PO TABS: 10.0000 mg | ORAL_TABLET | Freq: Every day | ORAL | Status: DC

## 2014-06-16 MED ORDER — OXYMETAZOLINE HCL 0.05 % NA SOLN: 1.0000 | Freq: Two times a day (BID) | NASAL | Status: DC

## 2014-06-16 NOTE — Progress Notes (Signed)
Patient ID: [name], male   DOB: 1965-11-02, 48 y.o.   MRN: [id number]   Subjective:  [name] is a 48 y.o. male here for headache.  Sharp right sided head pain x 1 week, "shooting across the right eye" worse with bending over. Comes and goes and thinks his ears seem "stopped up." No photo- or phonophobia. No nausea or vomiting. + fatigue. No fevers. Does not smoke.   Has a history of migraines that feel nothing like this.   Has AFib and has had palpitations for the past 1 week.  All other pertinent systems reviewed and are negative. Objective:  BP 135/78 mmHg  Pulse 72  Temp(Src) 98 F (36.7 C) (Oral)  Ht 6\' 2"  (1.88 m)  Wt 329 lb 14.4 oz (149.642 kg)  BMI 42.34 kg/m2  Gen: Obese 48 y.o. male in NAD HEENT: MMM, TMs with effusions without inflammation or retraction. Oropharynx clear. Nares injected and boggy. Pain on percussion over right frontal sinus. No pain with eye movement. PERRL, EOMI.  CV: RRR, no MRG, no JVD Resp: Non-labored, CTAB, no wheezes noted  ECG: NSR Assessment & Plan:  [name] is a 48 y.o. male with acute, likely viral frontal sinusitis:  Problem List Items Addressed This Visit    None    Visit Diagnoses    Chest pain, unspecified chest pain type    -  Primary    Relevant Orders       EKG 12-Lead (Completed)

## 2014-06-16 NOTE — Patient Instructions (Signed)
-   TAKE zyrtec once a day everyday for the next 4 weeks. If you are feeling better, try coming off of it and follow up with your primary care doctor.  - TAKE the nasal spray as directed for the next 2-3 days to help drain your sinuses and ears.  - If you experience fever or if these symptoms aren't improving in the next 10 days or so you need to call us to make an appointment.

## 2014-06-16 NOTE — Assessment & Plan Note (Signed)
Likely viral etiology. No antibiotic indicated as there has been insufficient duration and no "double worsening."  - Supportive care reviewed including tylenol q6h, adequate hydration, avoidance of allergens - Intranasal decongestant to relieve mucosal edema causing sinusitis and middle ear effusion - Oral antihistamine as pt has improved with this before and believes symptoms are worse when exposed to outdoors at work. - RTC if not improved in 2 weeks, fever, SOB.

## 2014-07-25 ENCOUNTER — Other Ambulatory Visit: Payer: Self-pay | Admitting: Family Medicine

## 2014-08-20 ENCOUNTER — Other Ambulatory Visit: Payer: Self-pay | Admitting: Family Medicine

## 2014-09-09 ENCOUNTER — Other Ambulatory Visit: Payer: Self-pay | Admitting: *Deleted

## 2014-09-09 DIAGNOSIS — I10 Essential (primary) hypertension: Secondary | ICD-10-CM

## 2014-09-09 MED ORDER — AMLODIPINE BESYLATE 10 MG PO TABS: 10.0000 mg | ORAL_TABLET | Freq: Every day | ORAL | Status: DC

## 2014-09-09 NOTE — Telephone Encounter (Signed)
Received call from pharmacy.  Patient out of BP med (amlodipine).  Called pharmacy and refilled x 1 via e-Rx.  Pharmacy will inform patient to schedule BP follow-up appt with PCP.  Burna Forts, BSN, RN-BC

## 2014-09-09 NOTE — Telephone Encounter (Signed)
Will fwd to PCP just to keep her in the loop. Some consideration to switch to beta blocker in the future due to h/o CHF and Atrial Fibrillation.  Hilton Sinclair, MD

## 2014-10-12 ENCOUNTER — Emergency Department (HOSPITAL_COMMUNITY)
Admission: EM | Admit: 2014-10-12 | Discharge: 2014-10-12 | Disposition: A | Discharge status: Home / Self Care | Payer: 59 | Attending: Emergency Medicine | Admitting: Emergency Medicine

## 2014-10-12 ENCOUNTER — Emergency Department (HOSPITAL_COMMUNITY): Payer: 59

## 2014-10-12 ENCOUNTER — Encounter (HOSPITAL_COMMUNITY): Payer: Self-pay | Admitting: Emergency Medicine

## 2014-10-12 ENCOUNTER — Other Ambulatory Visit: Payer: Self-pay | Admitting: *Deleted

## 2014-10-12 DIAGNOSIS — R0602 Shortness of breath: Secondary | ICD-10-CM | POA: Diagnosis not present

## 2014-10-12 DIAGNOSIS — I10 Essential (primary) hypertension: Secondary | ICD-10-CM

## 2014-10-12 DIAGNOSIS — G4733 Obstructive sleep apnea (adult) (pediatric): Secondary | ICD-10-CM | POA: Diagnosis not present

## 2014-10-12 DIAGNOSIS — E669 Obesity, unspecified: Secondary | ICD-10-CM | POA: Insufficient documentation

## 2014-10-12 DIAGNOSIS — M1990 Unspecified osteoarthritis, unspecified site: Secondary | ICD-10-CM | POA: Insufficient documentation

## 2014-10-12 DIAGNOSIS — R079 Chest pain, unspecified: Secondary | ICD-10-CM | POA: Insufficient documentation

## 2014-10-12 DIAGNOSIS — M109 Gout, unspecified: Secondary | ICD-10-CM | POA: Insufficient documentation

## 2014-10-12 DIAGNOSIS — Z880 Allergy status to penicillin: Secondary | ICD-10-CM | POA: Diagnosis not present

## 2014-10-12 DIAGNOSIS — Z87891 Personal history of nicotine dependence: Secondary | ICD-10-CM | POA: Insufficient documentation

## 2014-10-12 DIAGNOSIS — G8929 Other chronic pain: Secondary | ICD-10-CM | POA: Diagnosis not present

## 2014-10-12 DIAGNOSIS — Z79899 Other long term (current) drug therapy: Secondary | ICD-10-CM | POA: Diagnosis not present

## 2014-10-12 DIAGNOSIS — Z9981 Dependence on supplemental oxygen: Secondary | ICD-10-CM | POA: Diagnosis not present

## 2014-10-12 LAB — I-STAT TROPONIN, ED
TROPONIN I, POC: 0 ng/mL (ref 0.00–0.08)
Troponin i, poc: 0 ng/mL (ref 0.00–0.08)

## 2014-10-12 LAB — CBC WITH DIFFERENTIAL/PLATELET
Basophils Absolute: 0 10*3/uL (ref 0.0–0.1)
Basophils Relative: 0 % (ref 0–1)
Eosinophils Absolute: 0.1 10*3/uL (ref 0.0–0.7)
Eosinophils Relative: 1 % (ref 0–5)
HCT: 39.5 % (ref 39.0–52.0)
Hemoglobin: 14 g/dL (ref 13.0–17.0)
LYMPHS PCT: 37 % (ref 12–46)
Lymphs Abs: 2.9 10*3/uL (ref 0.7–4.0)
MCH: 28.7 pg (ref 26.0–34.0)
MCHC: 35.4 g/dL (ref 30.0–36.0)
MCV: 80.9 fL (ref 78.0–100.0)
MONO ABS: 0.5 10*3/uL (ref 0.1–1.0)
Monocytes Relative: 6 % (ref 3–12)
NEUTROS ABS: 4.5 10*3/uL (ref 1.7–7.7)
Neutrophils Relative %: 55 % (ref 43–77)
Platelets: 221 10*3/uL (ref 150–400)
RBC: 4.88 MIL/uL (ref 4.22–5.81)
RDW: 13 % (ref 11.5–15.5)
WBC: 8 10*3/uL (ref 4.0–10.5)

## 2014-10-12 LAB — BASIC METABOLIC PANEL
ANION GAP: 4 — AB (ref 5–15)
BUN: 10 mg/dL (ref 6–23)
CO2: 31 mmol/L (ref 19–32)
Calcium: 9.1 mg/dL (ref 8.4–10.5)
Chloride: 104 mmol/L (ref 96–112)
Creatinine, Ser: 0.85 mg/dL (ref 0.50–1.35)
GFR calc Af Amer: >90 mL/min (ref >90)
Glucose, Bld: 95 mg/dL (ref 70–99)
POTASSIUM: 3.8 mmol/L (ref 3.5–5.1)
Sodium: 139 mmol/L (ref 135–145)

## 2014-10-12 MED ORDER — AMLODIPINE BESYLATE 10 MG PO TABS: 10.0000 mg | ORAL_TABLET | Freq: Every day | ORAL | Status: DC

## 2014-10-12 MED ORDER — IOHEXOL 350 MG/ML SOLN
100.0000 mL | Freq: Once | INTRAVENOUS | Status: AC | PRN
  Administered 2014-10-12: 100 mL via INTRAVENOUS

## 2014-10-12 NOTE — ED Provider Notes (Signed)
CSN: [id number]     Arrival date & time 10/12/14  1241 History   First MD Initiated Contact with Patient 10/12/14 1259     Chief Complaint  Patient presents with  . Chest Pain     (Consider location/radiation/quality/duration/timing/severity/associated sxs/prior Treatment) HPI Comments: Patient is a 49 year old male with a past medical history of afib, hypertension, and obesity who presents with chest pain for the past month. The pain started gradually and remained constant since the onset. The pain is located in his left chest without radiation. The pain is described as pressure and severe. The pain is pleuritic. He reports some associated SOB. He was at an Urgent Care this morning who transferred him to the ED for chest pain evaluation. No alleviating factors. Patient reports taking Eliquis for anticoagulation. Patient was given nitro, morphine, ASA and zofran en route.    Past Medical History  Diagnosis Date  . Arthritis   . 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 History of MRI of cervical spine 09/25/10    normal; left shoulder normal as well  . Obstructive sleep apnea of adult 04/29/2011    moderate on CPAP  . Gout   . Chronic knee pain   . Chronic foot pain   . Hypertension   . Chronic elbow pain   . Chronic shoulder pain   . Chronic hip pain   . Headache    Past Surgical History  Procedure Laterality Date  . Knee surgery      Right Knee   History reviewed. No pertinent family history. History  Substance Use Topics  . Smoking status: Former Smoker    Types: Cigarettes    Quit date: 04/02/1994  . Smokeless tobacco: Never Used  . Alcohol Use: No    Review of Systems  Constitutional: Negative for fever, chills and fatigue.  HENT: Negative for trouble swallowing.   Eyes: Negative for visual disturbance.  Respiratory: Positive for shortness of breath.   Cardiovascular: Positive for chest pain.  Negative for palpitations.  Gastrointestinal: Negative for nausea, vomiting, abdominal pain and diarrhea.  Genitourinary: Negative for dysuria and difficulty urinating.  Musculoskeletal: Negative for arthralgias and neck pain.  Skin: Negative for color change.  Neurological: Negative for dizziness and weakness.  Psychiatric/Behavioral: Negative for dysphoric mood.      Allergies  Penicillins  Home Medications   Prior to Admission medications   Medication Sig Start Date End Date Taking? Authorizing Provider  amLODipine (NORVASC) 10 MG tablet Take 1 tablet (10 mg total) by mouth daily. 09/09/14  Yes Lupita Dawn, MD  colchicine 0.6 MG tablet Take 0.6 mg by mouth 2 (two) times daily as needed (gout).   Yes Historical Provider, MD  ELIQUIS 5 MG TABS tablet TAKE ONE TABLET BY MOUTH TWICE DAILY 08/23/14  Yes [name], MD  naproxen sodium (ANAPROX) 220 MG tablet Take 440 mg by mouth 2 (two) times daily as needed (back pain).   Yes Historical Provider, MD  nitroGLYCERIN (NITROSTAT) 0.4 MG SL tablet Place 0.4 mg under the tongue. 03/14/14  Yes Historical Provider, MD  Omega-3 Fatty Acids (FISH OIL) 1000 MG CAPS Take 2,000 mg by mouth daily.    Yes Historical Provider, MD  omeprazole (PRILOSEC) 20 MG capsule Take 20 mg by mouth every evening.   Yes Historical Provider, MD  cetirizine (ZYRTEC) 10 MG tablet Take 1 tablet (10 mg total) by mouth daily. 06/16/14  Patrecia Pour, MD  omeprazole (PRILOSEC) 20 MG capsule TAKE ONE CAPSULE BY MOUTH TWICE DAILY 04/01/14   [name], MD  omeprazole (PRILOSEC) 20 MG capsule Take 1 capsule (20 mg total) by mouth 2 (two) times daily. Needs PCP follow up if continued pain. 07/27/14   Hilton Sinclair, MD  oxymetazoline (AFRIN) 0.05 % nasal spray Place 1 spray into both nostrils 2 (two) times daily. 06/16/14   Patrecia Pour, MD   BP 129/67 mmHg  Pulse 61  Temp(Src) 97.7 F (36.5 C) (Oral)  Resp 17  Ht 6\' 2"  (1.88 m)  Wt 330 lb (149.687  kg)  BMI 42.35 kg/m2  SpO2 97% Physical Exam  Constitutional: He is oriented to person, place, and time. He appears well-developed and well-nourished. No distress.  HENT:  Head: Normocephalic and atraumatic.  Eyes: Conjunctivae and EOM are normal.  Neck: Normal range of motion.  Cardiovascular: Normal rate and regular rhythm.  Exam reveals no gallop and no friction rub.   No murmur heard. Pulmonary/Chest: Effort normal and breath sounds normal. He has no wheezes. He has no rales. He exhibits no tenderness.  Abdominal: Soft. He exhibits no distension. There is no tenderness. There is no rebound.  Musculoskeletal: Normal range of motion.  Neurological: He is alert and oriented to person, place, and time. Coordination normal.  Speech is goal-oriented. Moves limbs without ataxia.   Skin: Skin is warm and dry.  Psychiatric: He has a normal mood and affect. His behavior is normal.  Nursing note and vitals reviewed.   ED Course  Procedures (including critical care time) Labs Review Labs Reviewed  BASIC METABOLIC PANEL - Abnormal; Notable for the following:    Anion gap 4 (*)    All other components within normal limits  CBC WITH DIFFERENTIAL/PLATELET  Randolm Idol, ED  Randolm Idol, ED    Imaging Review Dg Chest 2 View  10/12/2014   CLINICAL DATA:  Chest pain for 1 month.  Cough.  EXAM: CHEST  2 VIEW  COMPARISON:  PA and lateral chest 03/14/2014.  FINDINGS: Minimal atelectasis or scar is seen the bases. The lungs are otherwise clear. Heart size is normal. No pneumothorax or pleural fluid.  IMPRESSION: No acute disease.   Electronically Signed   By: Inge Rise M.D.   On: 10/12/2014 14:04   Ct Angio Chest Pe W/cm &/or Wo Cm  10/12/2014   CLINICAL DATA:  Chest pain today.  No shortness of breath.  EXAM: CT ANGIOGRAPHY CHEST WITH CONTRAST  TECHNIQUE: Multidetector CT imaging of the chest was performed using the standard protocol during bolus administration of intravenous  contrast. Multiplanar CT image reconstructions and MIPs were obtained to evaluate the vascular anatomy.  CONTRAST:  166mL OMNIPAQUE IOHEXOL 350 MG/ML SOLN  COMPARISON:  None.  FINDINGS: Chest wall: No chest wall mass, supraclavicular or axillary lymphadenopathy. The thyroid gland is grossly normal. The bony thorax is intact. No destructive bone lesions or spinal canal compromise.  Mediastinum: The heart is upper limits of normal in size. No pericardial effusion. The aorta is normal in caliber. No dissection. The branch vessels are patent. No mediastinal or hilar mass or adenopathy. A few small scattered lymph nodes are noted. The esophagus is grossly normal.  The pulmonary arterial tree is fairly well opacified. No definite filling defects to suggest pulmonary embolism.  Lungs/pleura: 3D motion artifact but no obvious pulmonary lesions or acute pulmonary findings. No pleural effusion.  Upper abdomen: Diffuse fatty infiltration of the  liver. There is some reflux of contrast down the IVC and into the hepatic veins. This could be due to tricuspid regurgitation.  Review of the MIP images confirms the above findings.  IMPRESSION: No CT findings for pulmonary embolism.  No acute pulmonary findings.  Normal thoracic aorta.   Electronically Signed   By: Marijo Sanes M.D.   On: 10/12/2014 16:46     EKG Interpretation None      MDM   Final diagnoses:  Chest pain  SOB (shortness of breath)    3:33 PM Labs unremarkable for acute changes. Chest xray unremarkable for acute changes. Vitals stable and patient afebrile. Patient will have CT angio to rule out PE and delta troponin.   Patient signed out to Dr. Justin Mend.    [address] [place], PA-C 10/13/14 [place], MD 10/15/14 [phone]

## 2014-10-12 NOTE — ED Provider Notes (Signed)
Signout from [facility], [place], per her chart "Patient is a 49 year old male with a past medical history of afib, hypertension, and obesity who presents with chest pain for the past month. The pain started gradually and remained constant since the onset. The pain is located in his left chest without radiation. The pain is described as pressure and severe. The pain is pleuritic. He reports some associated SOB. He was at an Urgent Care this morning who transferred him to the ED for chest pain evaluation. No alleviating factors. Patient reports taking Eliquis for anticoagulation. Patient was given nitro, morphine, ASA and zofran en route."  Physical exam as above.  VS WNL.  CBC, BMP, initial troponin WNL.  CXR WNL.\  On signout pt awaiting delta troponin, and CT PE study.   Delta troponin and CT PE study are negative.    Diagnosis of atypical CP.  On reeval pt asymptomatic, ready for d/c home.   D/c home in good condition.  F/u with PCP in 1 week.  Return precautions given.  Pt understands and agrees with plan.   Sinda Du   Sinda Du, MD 10/13/14 4917  Davonna Belling, MD 10/15/14 [phone]

## 2014-10-12 NOTE — Discharge Instructions (Signed)

## 2014-10-12 NOTE — ED Notes (Signed)
Per EMS pt was at doctors office for chest pain sob and body aches that he has been having the past month. Office sent pt here for further evaluation. Pt has hx of Afib, and pt was in Afib on EMS arrival and converted to NS en route after receiving 2 nitro, 6mg  morphine, 324 ASA, and 4mg  zofran.

## 2014-10-13 ENCOUNTER — Ambulatory Visit (INDEPENDENT_AMBULATORY_CARE_PROVIDER_SITE_OTHER): Payer: 59 | Admitting: Family Medicine

## 2014-10-13 VITALS — BP 142/79 | HR 71 | Temp 97.8°F | Ht 74.0 in | Wt 326.1 lb

## 2014-10-13 DIAGNOSIS — M25562 Pain in left knee: Secondary | ICD-10-CM | POA: Insufficient documentation

## 2014-10-13 MED ORDER — METHYLPREDNISOLONE ACETATE 40 MG/ML IJ SUSP
40.0000 mg | Freq: Once | INTRAMUSCULAR | Status: AC
  Administered 2014-10-13: 40 mg via INTRA_ARTICULAR

## 2014-10-13 MED ORDER — TRAMADOL HCL 50 MG PO TABS: 50.0000 mg | ORAL_TABLET | Freq: Three times a day (TID) | ORAL | Status: DC | PRN

## 2014-10-13 NOTE — Progress Notes (Signed)
Patient ID: [name], male   DOB: 12-Feb-1966, 49 y.o.   MRN: [id number]   HPI  Patient presents today for left knee pain  Patient explains that for the past 3-4 days he's had left knee pain without any obvious inciting event. He denies any trauma. He states that he has medial left knee pain worse with walking and going up stairs. He also notes popping and locking symptoms over last few days as well. He denies any redness, swelling, or warmth of the joint. He denies any fevers, troubles breathing, or chest pain.  After discussion he states this is distinctly different than his previous gout flares and does not feel like gout related pain  He was seen in the ER one day ago for A. fib.  He is compliant with Eliquis  ROS: Per HPI  Objective: BP 142/79 mmHg  Pulse 71  Temp(Src) 97.8 F (36.6 C) (Oral)  Ht 6\' 2"  (1.88 m)  Wt 326 lb 1.6 oz (147.918 kg)  BMI 41.85 kg/m2 Gen: NAD, alert, cooperative with exam HEENT: NCAT, CV: RRR, good S1/S2, no murmur Resp: CTABL, no wheezes, non-labored Ext: No edema, warm Neuro: Alert and oriented  MSK: L: knee without erythema, effusion, bruising, or gross deformity Positive medial joint line tenderness ligamentously intact to Lachman's and with varus and valgus stress.  Negative McMurray's test   Left knee injection Informed consent obtained and placed in chart.  Time out performed.  Area cleaned with iodine x 2 and wiped clear with alcohol swab.  Using 21 1/2 gauge needle 1 cc depo-medrol and 3 cc's 1% Lidocaine were injected in L knee via medial approach.  Sterile bandage placed.  Patient tolerated procedure well.  No complications.    Assessment and plan:  Left knee pain Left knee pain without acute trauma, with medial knee pain and locking popping signs likely medial meniscus injury Discussed steroid injection versus supportive care, he would like the injection Depo medrol injected today L knee Recommended ice, knee sleeve, and pain  meds, he is on Eliquis so NSAIDs are not really an option Given tramadol for pain Also discussed referral to sports medicine versus orthopedics, he's had an MCL repair on his right knee previously [facility] orthopedics. There is a possibility is gout related pain also, however patient usually gets flares of his great toe, discussed the anti-inflammatory properties of colchicine and recommended taking the next 5 days.     Meds ordered this encounter  Medications  . traMADol (ULTRAM) 50 MG tablet    Sig: Take 1-2 tablets (50-100 mg total) by mouth every 8 (eight) hours as needed.    Dispense:  30 tablet    Refill:  0

## 2014-10-13 NOTE — Patient Instructions (Addendum)
Great to meet you!  It sounds like you have some meniscus (the knee cartilage) problems.  Try ice for 15 minutes 3 or more time daily, a knee sleeve, and the pain meds I have prescribed  Please come back if you do not improve.  Also take the colchicine for the next 5 days like we talked about.

## 2014-10-13 NOTE — Assessment & Plan Note (Addendum)
Left knee pain without acute trauma, with medial knee pain and locking popping signs likely medial meniscus injury Discussed steroid injection versus supportive care, he would like the injection Depo medrol injected today L knee Recommended ice, knee sleeve, and pain meds, he is on Eliquis so NSAIDs are not really an option Given tramadol for pain Also discussed referral to sports medicine versus orthopedics, he's had an MCL repair on his right knee previously [facility] orthopedics. There is a possibility is gout related pain also, however patient usually gets flares of his great toe, discussed the anti-inflammatory properties of colchicine and recommended taking the next 5 days.

## 2014-10-31 ENCOUNTER — Encounter (HOSPITAL_COMMUNITY): Payer: Self-pay | Admitting: Emergency Medicine

## 2014-10-31 ENCOUNTER — Emergency Department (HOSPITAL_COMMUNITY)
Admission: EM | Admit: 2014-10-31 | Discharge: 2014-11-01 | Disposition: A | Discharge status: Home / Self Care | Payer: 59 | Attending: Emergency Medicine | Admitting: Emergency Medicine

## 2014-10-31 DIAGNOSIS — Z79899 Other long term (current) drug therapy: Secondary | ICD-10-CM | POA: Diagnosis not present

## 2014-10-31 DIAGNOSIS — L02416 Cutaneous abscess of left lower limb: Secondary | ICD-10-CM | POA: Diagnosis present

## 2014-10-31 DIAGNOSIS — L03116 Cellulitis of left lower limb: Secondary | ICD-10-CM | POA: Diagnosis not present

## 2014-10-31 DIAGNOSIS — Z880 Allergy status to penicillin: Secondary | ICD-10-CM | POA: Insufficient documentation

## 2014-10-31 DIAGNOSIS — G8929 Other chronic pain: Secondary | ICD-10-CM | POA: Insufficient documentation

## 2014-10-31 DIAGNOSIS — Z87891 Personal history of nicotine dependence: Secondary | ICD-10-CM | POA: Insufficient documentation

## 2014-10-31 DIAGNOSIS — M109 Gout, unspecified: Secondary | ICD-10-CM | POA: Diagnosis not present

## 2014-10-31 DIAGNOSIS — I10 Essential (primary) hypertension: Secondary | ICD-10-CM | POA: Insufficient documentation

## 2014-10-31 DIAGNOSIS — G4733 Obstructive sleep apnea (adult) (pediatric): Secondary | ICD-10-CM | POA: Insufficient documentation

## 2014-10-31 DIAGNOSIS — Z9981 Dependence on supplemental oxygen: Secondary | ICD-10-CM | POA: Insufficient documentation

## 2014-10-31 MED ORDER — SULFAMETHOXAZOLE-TRIMETHOPRIM 800-160 MG PO TABS: 1.0000 | ORAL_TABLET | Freq: Two times a day (BID) | ORAL | Status: DC

## 2014-10-31 NOTE — ED Notes (Signed)
Pt reports having multiple abscesses to left leg- admits to pain at sites- denies fever or drainage.

## 2014-10-31 NOTE — Discharge Instructions (Signed)

## 2014-10-31 NOTE — ED Provider Notes (Signed)
CSN: [id number]     Arrival date & time 10/31/14  2231 History  This chart was scribed for non-physician practitioner, Etta Quill, NP working with Veryl Speak, MD by Frederich Balding, ED scribe. This patient was seen in room TR04C/TR04C and the patient's care was started at 11:40 PM.    Chief Complaint  Patient presents with  . Abscess   Patient is a 49 y.o. male presenting with abscess. The history is provided by the patient. No language interpreter was used.  Abscess Location:  Leg Leg abscess location:  L upper leg and L knee Abscess quality: induration, painful and redness   Red streaking: no   Progression:  Unchanged Pain details:    Severity:  Mild   Timing:  Constant   Progression:  Unchanged Chronicity:  New Context: not diabetes and not immunosuppression   Relieved by:  None tried Worsened by:  Nothing tried Ineffective treatments:  Warm compresses  HPI Comments: [name] is a 49 y.o. male who presents to the Emergency Department complaining of two pustules to his left thigh and one to his left knee that started several days ago. Pressure over the areas worsens pain. There are no alleviating factors. Pt has used warm compresses with no relief.  Past Medical History  Diagnosis Date  . Arthritis   . 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 History of MRI of cervical spine 09/25/10    normal; left shoulder normal as well  . Obstructive sleep apnea of adult 04/29/2011    moderate on CPAP  . Gout   . Chronic knee pain   . Chronic foot pain   . Hypertension   . Chronic elbow pain   . Chronic shoulder pain   . Chronic hip pain   . Headache    Past Surgical History  Procedure Laterality Date  . Knee surgery      Right Knee   No family history on file. History  Substance Use Topics  . Smoking status: Former Smoker    Types: Cigarettes    Quit date: 04/02/1994  . Smokeless tobacco: Never Used  .  Alcohol Use: No    Review of Systems  Skin:       Pustules   All other systems reviewed and are negative.  Allergies  Penicillins  Home Medications   Prior to Admission medications   Medication Sig Start Date End Date Taking? Authorizing Provider  amLODipine (NORVASC) 10 MG tablet Take 1 tablet (10 mg total) by mouth daily. 10/12/14   [name], MD  cetirizine (ZYRTEC) 10 MG tablet Take 1 tablet (10 mg total) by mouth daily. 06/16/14   Patrecia Pour, MD  colchicine 0.6 MG tablet Take 0.6 mg by mouth 2 (two) times daily as needed (gout).    Historical Provider, MD  ELIQUIS 5 MG TABS tablet TAKE ONE TABLET BY MOUTH TWICE DAILY 08/23/14   [name], MD  naproxen sodium (ANAPROX) 220 MG tablet Take 440 mg by mouth 2 (two) times daily as needed (back pain).    Historical Provider, MD  nitroGLYCERIN (NITROSTAT) 0.4 MG SL tablet Place 0.4 mg under the tongue. 03/14/14   Historical Provider, MD  Omega-3 Fatty Acids (FISH OIL) 1000 MG CAPS Take 2,000 mg by mouth daily.     Historical Provider, MD  omeprazole (PRILOSEC) 20 MG capsule TAKE ONE CAPSULE BY MOUTH TWICE DAILY 04/01/14   Delorse Limber  Ardelia Mems, MD  omeprazole (PRILOSEC) 20 MG capsule Take 1 capsule (20 mg total) by mouth 2 (two) times daily. Needs PCP follow up if continued pain. 07/27/14   Hilton Sinclair, MD  omeprazole (PRILOSEC) 20 MG capsule Take 20 mg by mouth every evening.    Historical Provider, MD  oxymetazoline (AFRIN) 0.05 % nasal spray Place 1 spray into both nostrils 2 (two) times daily. 06/16/14   Patrecia Pour, MD  traMADol (ULTRAM) 50 MG tablet Take 1-2 tablets (50-100 mg total) by mouth every 8 (eight) hours as needed. 10/13/14   Timmothy Euler, MD   BP 140/82 mmHg  Pulse 81  Temp(Src) 97.6 F (36.4 C) (Oral)  Resp 18  SpO2 97%   Physical Exam  Constitutional: He is oriented to person, place, and time. He appears well-developed and well-nourished. No distress.  HENT:  Head: Normocephalic and  atraumatic.  Eyes: Conjunctivae and EOM are normal.  Neck: Neck supple. No tracheal deviation present.  Cardiovascular: Normal rate, regular rhythm and normal heart sounds.   Pulmonary/Chest: Effort normal and breath sounds normal. No respiratory distress.  Musculoskeletal: Normal range of motion.  Neurological: He is alert and oriented to person, place, and time.  Skin: Skin is warm and dry.  Two pustules to his left thigh and one to his left knee with surrounding induration.   Psychiatric: He has a normal mood and affect. His behavior is normal.  Nursing note and vitals reviewed.   ED Course  Procedures (including critical care time)  DIAGNOSTIC STUDIES: Oxygen Saturation is 97% on RA, normal by my interpretation.    COORDINATION OF CARE: 11:43 PM-Discussed treatment plan which includes drainage and antibiotics with pt at bedside and pt agreed to plan.   Labs Review Labs Reviewed - No data to display  Imaging Review No results found.   EKG Interpretation None      MDM   Final diagnoses:  None    Cellulitis left thigh.  Antibiotic coverage,  Follow-up with PCP ([facility])  .[email or web address]   Etta Quill, NP 11/01/14 [phone]  Veryl Speak, MD 11/01/14 [phone]

## 2014-11-04 ENCOUNTER — Encounter: Payer: Self-pay | Admitting: Family Medicine

## 2014-11-04 ENCOUNTER — Ambulatory Visit (INDEPENDENT_AMBULATORY_CARE_PROVIDER_SITE_OTHER): Payer: 59 | Admitting: Family Medicine

## 2014-11-04 VITALS — BP 132/81 | HR 80 | Temp 97.9°F | Wt 324.0 lb

## 2014-11-04 DIAGNOSIS — L03116 Cellulitis of left lower limb: Secondary | ICD-10-CM | POA: Diagnosis not present

## 2014-11-04 DIAGNOSIS — M10072 Idiopathic gout, left ankle and foot: Secondary | ICD-10-CM

## 2014-11-04 DIAGNOSIS — M109 Gout, unspecified: Secondary | ICD-10-CM

## 2014-11-04 MED ORDER — IBUPROFEN 600 MG PO TABS: 600.0000 mg | ORAL_TABLET | Freq: Three times a day (TID) | ORAL | Status: DC | PRN

## 2014-11-04 MED ORDER — SULFAMETHOXAZOLE-TRIMETHOPRIM 800-160 MG PO TABS: 1.0000 | ORAL_TABLET | Freq: Two times a day (BID) | ORAL | Status: AC

## 2014-11-04 MED ORDER — DOXYCYCLINE HYCLATE 100 MG PO TABS: 100.0000 mg | ORAL_TABLET | Freq: Two times a day (BID) | ORAL | Status: DC

## 2014-11-04 NOTE — Progress Notes (Signed)
Patient ID: [name], male   DOB: [birth year], 49 y.o.   MRN: [id number]   HPI:  Pt presents for a same day appointment to discuss left leg infection. Was seen in the emergency room on 3/28 and started on Bactrim. Reports that the infection in his legs seems to be getting worse. Especially around the proximal area of infection, the erythema is spreading. He's been compliant with the Bactrim. He denies any fever, nausea, or vomiting. He is eating and drinking well. The pustules began forming on Saturday of this week.  In the meantime, since starting the Bactrim he has developed a gout flare of his left MTP joint. The toe is warm and erythematous with discomfort. He has taken colchicine without any relief. This is similar to prior gout flares.  ROS: See HPI  [place]: Hypertension, atrial fibrillation, GERD, obesity, gout  PHYSICAL EXAM: BP 132/81 mmHg  Pulse 80  Temp(Src) 97.9 F (36.6 C) (Oral)  Wt 324 lb (146.965 kg) Gen: No acute distress, pleasant, cooperative, well-appearing Extremities: Left leg with 3 distinct areas of infection.  -On the medial aspect of the left knee there is a approximately 1 cm ulceration of the skin with serous drainage present and some surrounding erythema. No fluctuance on exam.  -There is also a lesion on the distal left thigh with a pustule in the middle and some surrounding erythema. No fluctuance.  -Proximally on the left thigh there is an area of induration with a central pustule, with surrounding erythema. This is the area the patient reports has spread. -Swelling, warmth, and slight erythema around the first MTP joint of the left foot. Diminished range of motion of the toe. Tender to palpation. Consistent with gout. -Able to extend and flex the knee and bear weight without much discomfort. No erythema generalized over the left knee. No large effusion present.  ASSESSMENT/PLAN:  1. Skin infections: Not improving on 1 pill of Bactrim DS twice a day. Nothing  on exam today that could be drained with I&D. I will add doxycycline to his regimen. He will follow-up on Monday. If he is not improving at that time he may require admission to the hospital for IV antibiotics. The area on his proximal left thigh was circumscribed with a pen to demarcate where the erythema stops today.  2. Gout flare: Unable to do prednisone given the infections as noted above. I will prescribe prescription strength Tylenol 600 mg every 8 hours as needed for pain. Continue colchicine. Consider starting allopurinol at a later time for gout prevention.  FOLLOW UP: F/u in 3 days for left leg infection and gout flare.  [place]. Ardelia Mems, [place]

## 2014-11-04 NOTE — Patient Instructions (Signed)
Add doxycycline twice a day Continue the bactrim DS one pill twice a day - I sent in more of this antibiotic for you Use ibuprofen every 8 hours as needed for gout pain Stop the ibuprofen if you develop bleeding  Come back to see me Monday afternoon  Be well, Dr. Ardelia Mems

## 2014-11-05 ENCOUNTER — Emergency Department (HOSPITAL_COMMUNITY)
Admission: EM | Admit: 2014-11-05 | Discharge: 2014-11-05 | Disposition: A | Discharge status: Home / Self Care | Payer: 59 | Attending: Emergency Medicine | Admitting: Emergency Medicine

## 2014-11-05 ENCOUNTER — Encounter (HOSPITAL_COMMUNITY): Payer: Self-pay | Admitting: *Deleted

## 2014-11-05 DIAGNOSIS — Z880 Allergy status to penicillin: Secondary | ICD-10-CM | POA: Diagnosis not present

## 2014-11-05 DIAGNOSIS — I10 Essential (primary) hypertension: Secondary | ICD-10-CM | POA: Diagnosis not present

## 2014-11-05 DIAGNOSIS — G8929 Other chronic pain: Secondary | ICD-10-CM | POA: Diagnosis not present

## 2014-11-05 DIAGNOSIS — G4733 Obstructive sleep apnea (adult) (pediatric): Secondary | ICD-10-CM | POA: Diagnosis not present

## 2014-11-05 DIAGNOSIS — Z87891 Personal history of nicotine dependence: Secondary | ICD-10-CM | POA: Insufficient documentation

## 2014-11-05 DIAGNOSIS — L03116 Cellulitis of left lower limb: Secondary | ICD-10-CM | POA: Insufficient documentation

## 2014-11-05 DIAGNOSIS — Z791 Long term (current) use of non-steroidal anti-inflammatories (NSAID): Secondary | ICD-10-CM | POA: Insufficient documentation

## 2014-11-05 DIAGNOSIS — L0291 Cutaneous abscess, unspecified: Secondary | ICD-10-CM

## 2014-11-05 DIAGNOSIS — L02416 Cutaneous abscess of left lower limb: Secondary | ICD-10-CM | POA: Diagnosis not present

## 2014-11-05 DIAGNOSIS — Z9981 Dependence on supplemental oxygen: Secondary | ICD-10-CM | POA: Insufficient documentation

## 2014-11-05 DIAGNOSIS — Z79899 Other long term (current) drug therapy: Secondary | ICD-10-CM | POA: Insufficient documentation

## 2014-11-05 DIAGNOSIS — L0390 Cellulitis, unspecified: Secondary | ICD-10-CM

## 2014-11-05 LAB — BASIC METABOLIC PANEL
Anion gap: 6 (ref 5–15)
BUN: 12 mg/dL (ref 6–23)
CALCIUM: 9.2 mg/dL (ref 8.4–10.5)
CO2: 26 mmol/L (ref 19–32)
CREATININE: 0.95 mg/dL (ref 0.50–1.35)
Chloride: 105 mmol/L (ref 96–112)
GFR calc Af Amer: >90 mL/min (ref >90)
GFR calc non Af Amer: >90 mL/min (ref >90)
Glucose, Bld: 116 mg/dL — ABNORMAL HIGH (ref 70–99)
POTASSIUM: 4.4 mmol/L (ref 3.5–5.1)
Sodium: 137 mmol/L (ref 135–145)

## 2014-11-05 LAB — CBC WITH DIFFERENTIAL/PLATELET
HCT: 39.4 % (ref 39.0–52.0)
Hemoglobin: 13.4 g/dL (ref 13.0–17.0)
MCH: 28.4 pg (ref 26.0–34.0)
MCHC: 34 g/dL (ref 30.0–36.0)
MCV: 83.5 fL (ref 78.0–100.0)
PLATELETS: 246 10*3/uL (ref 150–400)
RBC: 4.72 MIL/uL (ref 4.22–5.81)
RDW: 12.8 % (ref 11.5–15.5)
WBC: 9.5 10*3/uL (ref 4.0–10.5)

## 2014-11-05 MED ORDER — LIDOCAINE-EPINEPHRINE (PF) 2 %-1:200000 IJ SOLN
20.0000 mL | Freq: Once | INTRAMUSCULAR | Status: AC
  Administered 2014-11-05: 20 mL
  Filled 2014-11-05: qty 20

## 2014-11-05 MED ORDER — CLINDAMYCIN HCL 150 MG PO CAPS: 450.0000 mg | ORAL_CAPSULE | Freq: Three times a day (TID) | ORAL | Status: DC

## 2014-11-05 MED ORDER — CLINDAMYCIN HCL 300 MG PO CAPS
450.0000 mg | ORAL_CAPSULE | Freq: Once | ORAL | Status: AC
  Administered 2014-11-05: 450 mg via ORAL
  Filled 2014-11-05: qty 1

## 2014-11-05 NOTE — ED Notes (Signed)
Ultrasound set up at bedside

## 2014-11-05 NOTE — ED Notes (Signed)
Pt in c/o increased redness and swelling to cellulitis to left thigh, states the area was marked and he was told to come to ED if redness spread past a certain point, denies fever

## 2014-11-05 NOTE — Discharge Instructions (Signed)

## 2014-11-05 NOTE — ED Provider Notes (Signed)
CSN: [id number]     Arrival date & time 11/05/14  0900 History   First MD Initiated Contact with Patient 11/05/14 0915     Chief Complaint  Patient presents with  . Cellulitis     (Consider location/radiation/quality/duration/timing/severity/associated sxs/prior Treatment) Patient is a 49 y.o. male presenting with rash.  Rash Location: Left leg. Quality: painful and redness   Pain details:    Quality:  Hot   Severity:  Moderate   Onset quality:  Gradual   Duration:  1 week   Timing:  Constant   Progression:  Worsening Severity:  Moderate Context comment:  Seen in the emergency room week ago and diagnosed with cellulitis. Placed on Bactrim. Size PCP yesterday who added Doxy. Redness is spreading just past the demarcation line from yesterday. Relieved by:  Nothing Ineffective treatments: Bactrim for several days and Doxy for 1 day. Associated symptoms: no fever and no myalgias     Past Medical History  Diagnosis Date  . Arthritis   . 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 History of MRI of cervical spine 09/25/10    normal; left shoulder normal as well  . Obstructive sleep apnea of adult 04/29/2011    moderate on CPAP  . Gout   . Chronic knee pain   . Chronic foot pain   . Hypertension   . Chronic elbow pain   . Chronic shoulder pain   . Chronic hip pain   . Headache    Past Surgical History  Procedure Laterality Date  . Knee surgery      Right Knee   History reviewed. No pertinent family history. History  Substance Use Topics  . Smoking status: Former Smoker    Types: Cigarettes    Quit date: 04/02/1994  . Smokeless tobacco: Never Used  . Alcohol Use: No    Review of Systems  Constitutional: Negative for fever.  Musculoskeletal: Negative for myalgias.  Skin: Positive for rash.  All other systems reviewed and are negative.     Allergies  Penicillins  Home Medications   Prior to  Admission medications   Medication Sig Start Date End Date Taking? Authorizing Provider  amLODipine (NORVASC) 10 MG tablet Take 1 tablet (10 mg total) by mouth daily. 10/12/14  Yes [name], MD  aspirin-acetaminophen-caffeine (EXCEDRIN MIGRAINE) [phone] MG per tablet Take 2 tablets by mouth every 6 (six) hours as needed for headache.   Yes Historical Provider, MD  colchicine 0.6 MG tablet Take 0.6 mg by mouth 2 (two) times daily as needed (gout).   Yes Historical Provider, MD  doxycycline (VIBRA-TABS) 100 MG tablet Take 1 tablet (100 mg total) by mouth 2 (two) times daily. 11/04/14  Yes [name], MD  ELIQUIS 5 MG TABS tablet TAKE ONE TABLET BY MOUTH TWICE DAILY 08/23/14  Yes [name], MD  ibuprofen (ADVIL,MOTRIN) 600 MG tablet Take 1 tablet (600 mg total) by mouth every 8 (eight) hours as needed. 11/04/14  Yes [name], MD  naproxen sodium (ANAPROX) 220 MG tablet Take 440 mg by mouth 2 (two) times daily as needed (back pain).   Yes Historical Provider, MD  Omega-3 Fatty Acids (FISH OIL) 1000 MG CAPS Take 2,000 mg by mouth daily.    Yes Historical Provider, MD  omeprazole (PRILOSEC) 20 MG capsule Take 1 capsule (20 mg total) by mouth 2 (two) times daily. Needs PCP follow up if continued pain.  07/27/14  Yes Hilton Sinclair, MD  sulfamethoxazole-trimethoprim (BACTRIM DS,SEPTRA DS) 800-160 MG per tablet Take 1 tablet by mouth 2 (two) times daily. 11/04/14 11/11/14 Yes [name], MD  traMADol (ULTRAM) 50 MG tablet Take 1-2 tablets (50-100 mg total) by mouth every 8 (eight) hours as needed. 10/13/14  Yes Timmothy Euler, MD  cetirizine (ZYRTEC) 10 MG tablet Take 1 tablet (10 mg total) by mouth daily. Patient not taking: Reported on 11/05/2014 06/16/14   Patrecia Pour, MD  omeprazole (PRILOSEC) 20 MG capsule TAKE ONE CAPSULE BY MOUTH TWICE DAILY Patient not taking: Reported on 11/05/2014 04/01/14   [name], MD  oxymetazoline (AFRIN) 0.05 % nasal  spray Place 1 spray into both nostrils 2 (two) times daily. Patient not taking: Reported on 11/05/2014 06/16/14   Patrecia Pour, MD   BP 137/88 mmHg  Pulse 74  Temp(Src) 97.5 F (36.4 C) (Oral)  Resp 20  SpO2 98% Physical Exam  Constitutional: He is oriented to person, place, and time. He appears well-developed and well-nourished. No distress.  HENT:  Head: Normocephalic and atraumatic.  Eyes: Conjunctivae are normal. No scleral icterus.  Neck: Neck supple.  Cardiovascular: Normal rate and intact distal pulses.   Pulmonary/Chest: Effort normal. No stridor. No respiratory distress.  Abdominal: Normal appearance. He exhibits no distension.  Musculoskeletal:       Legs: Neurological: He is alert and oriented to person, place, and time.  Skin: Skin is warm and dry. No rash noted.  Psychiatric: He has a normal mood and affect. His behavior is normal.  Nursing note and vitals reviewed.   ED Course  INCISION AND DRAINAGE Date/Time: 11/05/2014 11:39 AM Performed by: Serita Grit Authorized by: Serita Grit Consent: Verbal consent obtained. Risks and benefits: risks, benefits and alternatives were discussed Type: abscess Body area: lower extremity Location details: left leg Local anesthetic: lidocaine 2% with epinephrine Anesthetic total: 3 ml Scalpel size: 11 Incision type: single straight Complexity: simple Drainage: purulent Drainage amount: scant Wound treatment: wound left open Patient tolerance: Patient tolerated the procedure well with no immediate complications   (including critical care time)  EMERGENCY DEPARTMENT US SOFT TISSUE INTERPRETATION "Study: Limited Soft Tissue Ultrasound"  INDICATIONS: Soft tissue infection Multiple views of the body part were obtained in real-time with a multi-frequency linear probe PERFORMED BY:  Myself IMAGES ARCHIVED?: Yes SIDE:Left BODY PART:Lower extremity FINDINGS: Abcess present INTERPRETATION:  Abcess present    Labs  Review Labs Reviewed  BASIC METABOLIC PANEL - Abnormal; Notable for the following:    Glucose, Bld 116 (*)    All other components within normal limits  CBC WITH DIFFERENTIAL/PLATELET    Imaging Review No results found.   EKG Interpretation None      MDM   Final diagnoses:  Abscess and cellulitis    49 year old male who is being treated for cellulitis initially with Bactrim and now with Bactrim and Doxy. He seems to be slightly worse than yesterday according to the demarcation line drawn at his PCPs office. He had a very small fluid collection seen on ultrasound, which was drained without complication. Due to his clinical course, we will also change his antibiotics to clindamycin. He has follow-up in 2 days. I don't think he needs admission for IV antibiotics based on his history, labs, and exam.    Serita Grit, MD 11/05/14 1144

## 2014-11-05 NOTE — ED Notes (Signed)
Messages sent to pharmacy at 1219 and 1245 requesting medication.

## 2014-11-07 ENCOUNTER — Ambulatory Visit (INDEPENDENT_AMBULATORY_CARE_PROVIDER_SITE_OTHER): Payer: 59 | Admitting: Family Medicine

## 2014-11-07 ENCOUNTER — Encounter: Payer: Self-pay | Admitting: Family Medicine

## 2014-11-07 VITALS — BP 151/89 | HR 62 | Temp 97.7°F | Ht 75.0 in | Wt 329.8 lb

## 2014-11-07 DIAGNOSIS — M109 Gout, unspecified: Secondary | ICD-10-CM

## 2014-11-07 DIAGNOSIS — L03116 Cellulitis of left lower limb: Secondary | ICD-10-CM

## 2014-11-07 DIAGNOSIS — M10072 Idiopathic gout, left ankle and foot: Secondary | ICD-10-CM | POA: Diagnosis not present

## 2014-11-07 MED ORDER — COLCHICINE 0.6 MG PO TABS: 0.6000 mg | ORAL_TABLET | Freq: Two times a day (BID) | ORAL | Status: DC | PRN

## 2014-11-07 NOTE — Patient Instructions (Signed)
Continue the clindamycin Follow-up in one week, or sooner if you not getting better Continue ibuprofen for gout as needed. Continue colchicine. I have refilled this for you.  Be well, Dr. Ardelia Mems

## 2014-11-07 NOTE — Progress Notes (Signed)
Patient ID: [name], male   DOB: [birth year], 49 y.o.   MRN: [id number]  HPI:  Pt presents to follow up on his leg infections.  I saw him Friday, at which time he had spreading erythema around proximal infected stop on L thigh. We added doxycycline to his bactrim at that time.  Over the weekend he got concerned that the erythema continued to spread so he went to the ER. Had I&D of this proximal lesion done, with some pus expressed reportedly. Abx were switched to clindamycin at that time (d/c'd bactrim and doxycycline).  Since then he feels like things are getting better. Denies fever, vomiting, nausea, signs of systemic illness. Eating and drinking normally. Urinating normally. The spot on his knee was worse over the weekend than it is today. It is still draining.  Also, his gout flare in L foot is much better. Has been taking colchicine and ibuprofen. Still a little sore but overall better.   ROS: See HPI  [place]: hx hyperlipidemia, gout, HTN, GERD, OSA, afib  PHYSICAL EXAM: BP 151/89 mmHg  Pulse 62  Temp(Src) 97.7 F (36.5 C) (Oral)  Ht 6\' 3"  (1.905 m)  Wt 329 lb 12.8 oz (149.596 kg)  BMI 41.22 kg/m2 Gen: NAD HEENT: NCAT Lungs: NWOB Left lower ext: -proximal thigh area with less erythema and warmth than Friday. S/p I&D without continued drainage. No fluctuance. -mid thigh area with small area of erythema, greatly decreased since Friday -medial knee with ~2cm flattened nodule which drains slight purulent maternal, no appreciable fluctuance, better since weekend per patient. No significant joint effusion or warmth. Full active ROM without pain. -L foot with still mild erythema of great toe MTP joint but improved ROM and pain since Friday Neuro: grossly nonfocal speech normal  ASSESSMENT/PLAN:  1. Cellulitis: improving on clindamycin. Clearly I&D was beneficial to him over the weekend. The spot on his L knee is still a slightly worrisome but there is nothing drainable and it seems  to be improving. He has no signs of septic arthritis (can bear weight, flex and extend the knee without discomfort). Plan: -continue clindamycin at present dosing -remain of doxcycyline and bactrim -f/u in 1 week for re-evaluation, sooner if worsening -if resolved at 1 week f/u can stop abx -Precepted with Dr. Ree Kida who also examined patient and agrees with this plan.   2. Gout flare: improving. Refill colchicine. Continue ibuprofen prn.  FOLLOW UP: F/u in 1 week for recheck L leg skin infxns  [name], [place]

## 2014-11-08 ENCOUNTER — Encounter: Payer: Self-pay | Admitting: *Deleted

## 2014-11-08 NOTE — Progress Notes (Signed)
Prior Authorization received from [place] for Colchicine 0.6 mg. PA completed online at [email or web address].  PA was sent to [facility] for reveiw.  Per [facility] PA cannot be completed because the medication is a plan exclusion.  There is no coverage criteria to review or apply.  Reference number: [id number].  Derl Barrow, RN

## 2014-11-09 NOTE — Progress Notes (Signed)
Discussed with Tamika. Apparently pt's insurance will absolutely not cover this medication, which is surprising to me since he has been on it before and he truly does need it.  Red team, could you call patient and let him know this is not covered? It may help if he calls his insurance and finds out if there are alternative medications they would cover.  Thanks, [name], MD

## 2014-11-09 NOTE — Progress Notes (Signed)
Left message on patient voicemail with message from MD. Instructed patient to call back once he speaks with insurance company.

## 2014-11-22 ENCOUNTER — Other Ambulatory Visit: Payer: Self-pay | Admitting: Family Medicine

## 2014-11-30 ENCOUNTER — Telehealth: Payer: Self-pay | Admitting: Cardiovascular Disease

## 2014-11-30 ENCOUNTER — Other Ambulatory Visit: Payer: Self-pay | Admitting: Family Medicine

## 2014-11-30 MED ORDER — PROPRANOLOL HCL 10 MG PO TABS: 10.0000 mg | ORAL_TABLET | Freq: Four times a day (QID) | ORAL | Status: DC | PRN

## 2014-11-30 NOTE — Telephone Encounter (Signed)
Spoke with [name]. He reports he got up this morning around 4 AM and was in afib.  Had shortness of breath and was aware of irregular heart beat. Went to work. Starting feeling bad at work with activity.  EMS was called and wanted to transport [name] to hospital but he did not want to go.  Heart rate was around 100.  He is now at home. Someone is currently with him.  He reports he is still out of rhythm but OK when resting. Heart rate goes up with activity. Does not know heart rate.  Also reports some dizziness when getting up and legs feel weak. Feels better than he did earlier today at work but still has shortness of breath with activity. He reports atrial fib occurs about once per month.  Lasts from a few hours to 2 days.  Is taking Eliquis as listed on med list. Will review with Dr. Acie Fredrickson.

## 2014-11-30 NOTE — Telephone Encounter (Signed)
New message     Pt was at work today---he had sob and turning pale according to his employer----called EMT.  They said he was in AFIB---HR 89-100.  Pt refused to go to the hosp.  He is at home. He was told to call his doctor.  Please advise

## 2014-11-30 NOTE — Telephone Encounter (Signed)
Spoke with patient and reviewed Dr. Elmarie Shiley advice with him.  Patient verbalized understanding and agreement to start Propranolol 10 mg up to 4 times daily as needed.  I explained that he needs to take a pill as soon as he picks up the medication and then another tonight after dinner.  I advised that he may take up to 4 times tomorrow as needed.  I advised him that if he remains in atrial fib on Friday to call the office for an appointment.  Patient verbalized understanding and agreement and Rx sent to patient's pharmacy.

## 2014-11-30 NOTE — Telephone Encounter (Signed)
Try Propranolol 10 mg QID PRN  Call tomorrow or Friday If he is stil in atrial fib, he will need to be worked in

## 2014-12-02 NOTE — Telephone Encounter (Signed)
Left message for patient to call back if needed and/or to call provider on call with questions or concerns as it is late on Friday.

## 2014-12-16 ENCOUNTER — Other Ambulatory Visit: Payer: Self-pay | Admitting: Family Medicine

## 2015-01-23 ENCOUNTER — Other Ambulatory Visit: Payer: Self-pay | Admitting: Family Medicine

## 2015-02-17 ENCOUNTER — Telehealth: Payer: Self-pay | Admitting: Family Medicine

## 2015-02-17 NOTE — Telephone Encounter (Signed)
Left another message on voicemail for patient to call back regarding refill request.

## 2015-02-17 NOTE — Telephone Encounter (Signed)
I am not sure what he means. We haven't seen him here in 3 months, or had any calls from him. Red team, can you call him and inquire what he needs? Thanks, [name], MD

## 2015-02-17 NOTE — Telephone Encounter (Signed)
Pt calling to check status of medication he said MD was supposed to call in for him for his gout

## 2015-02-17 NOTE — Telephone Encounter (Signed)
Left message on voicemail for patient to call back. 

## 2015-02-20 MED ORDER — COLCHICINE 0.6 MG PO TABS: 0.6000 mg | ORAL_TABLET | Freq: Two times a day (BID) | ORAL | Status: DC | PRN

## 2015-02-20 NOTE — Telephone Encounter (Signed)
Spoke with patient, he states his gout started flaring up last week and would like a refill on his colchine. Will forward to PCP.

## 2015-02-20 NOTE — Telephone Encounter (Signed)
Patient informed, expressed understanding. 

## 2015-02-20 NOTE — Telephone Encounter (Signed)
Left message on voicemail for patient to call back. 

## 2015-02-20 NOTE — Telephone Encounter (Signed)
Colchicine rx sent in. Please inform pt that he can take two pills now, followed by one pill in one hour, then go to twice dosing after that.  If not improving he will need to come in for office visit for evaluation. [name], MD

## 2015-02-21 ENCOUNTER — Telehealth: Payer: Self-pay | Admitting: *Deleted

## 2015-02-21 NOTE — Telephone Encounter (Signed)
Prior Authorization received from [place] for Colchicine 0.6 mg.  PA form placed in provider box for completion. Derl Barrow, RN

## 2015-02-22 NOTE — Telephone Encounter (Signed)
Faxed to [facility] for review.  Derl Barrow, RN

## 2015-02-22 NOTE — Telephone Encounter (Signed)
Form completed, will return to Tamika RN. Cashmere Harmes J Eduarda Scrivens, MD  

## 2015-02-24 MED ORDER — MITIGARE 0.6 MG PO CAPS: 1.0000 | ORAL_CAPSULE | Freq: Two times a day (BID) | ORAL | Status: DC | PRN

## 2015-02-24 NOTE — Telephone Encounter (Signed)
Called [facility] to check status of prior authorization for colchicine 0.6 mg tab (non-formulary).  PA is pending for pharmacist review.  The representative checked formulary; patient can use Mitigare 0.6 mg capsule.  Mitigare is a tier 2 drug and does not require a PA.  Colchicine is a Tier 4, which would require a higher co-pay if approved.  Derl Barrow, RN

## 2015-02-24 NOTE — Telephone Encounter (Signed)
Rx sent in for brand name Mitigare. [name], MD

## 2015-02-24 NOTE — Addendum Note (Signed)
Addended by: [name] on: 02/24/2015 07:28 PM   Modules accepted: Orders

## 2015-03-03 ENCOUNTER — Encounter (HOSPITAL_COMMUNITY): Payer: Self-pay | Admitting: Emergency Medicine

## 2015-03-03 ENCOUNTER — Emergency Department (HOSPITAL_COMMUNITY): Payer: 59

## 2015-03-03 ENCOUNTER — Observation Stay (HOSPITAL_COMMUNITY)
Admission: EM | Admit: 2015-03-03 | Discharge: 2015-03-05 | Disposition: A | Discharge status: Home / Self Care | Payer: 59 | Attending: Cardiology | Admitting: Cardiology

## 2015-03-03 DIAGNOSIS — G4733 Obstructive sleep apnea (adult) (pediatric): Secondary | ICD-10-CM | POA: Insufficient documentation

## 2015-03-03 DIAGNOSIS — Z87891 Personal history of nicotine dependence: Secondary | ICD-10-CM | POA: Insufficient documentation

## 2015-03-03 DIAGNOSIS — Z7901 Long term (current) use of anticoagulants: Secondary | ICD-10-CM | POA: Insufficient documentation

## 2015-03-03 DIAGNOSIS — R079 Chest pain, unspecified: Secondary | ICD-10-CM | POA: Insufficient documentation

## 2015-03-03 DIAGNOSIS — Z8249 Family history of ischemic heart disease and other diseases of the circulatory system: Secondary | ICD-10-CM | POA: Insufficient documentation

## 2015-03-03 DIAGNOSIS — I10 Essential (primary) hypertension: Secondary | ICD-10-CM | POA: Insufficient documentation

## 2015-03-03 DIAGNOSIS — E785 Hyperlipidemia, unspecified: Secondary | ICD-10-CM | POA: Insufficient documentation

## 2015-03-03 DIAGNOSIS — R0789 Other chest pain: Secondary | ICD-10-CM

## 2015-03-03 DIAGNOSIS — I480 Paroxysmal atrial fibrillation: Secondary | ICD-10-CM | POA: Insufficient documentation

## 2015-03-03 DIAGNOSIS — R002 Palpitations: Secondary | ICD-10-CM | POA: Insufficient documentation

## 2015-03-03 DIAGNOSIS — Z9989 Dependence on other enabling machines and devices: Secondary | ICD-10-CM

## 2015-03-03 HISTORY — DX: Low back pain: M54.5

## 2015-03-03 HISTORY — DX: Obstructive sleep apnea (adult) (pediatric): G47.33

## 2015-03-03 HISTORY — DX: Dependence on other enabling machines and devices: Z99.89

## 2015-03-03 HISTORY — DX: Gastro-esophageal reflux disease without esophagitis: K21.9

## 2015-03-03 HISTORY — DX: Migraine, unspecified, not intractable, without status migrainosus: G43.909

## 2015-03-03 HISTORY — DX: Other chronic pain: G89.29

## 2015-03-03 HISTORY — DX: Other chest pain: R07.89

## 2015-03-03 HISTORY — DX: Low back pain, unspecified: M54.50

## 2015-03-03 LAB — I-STAT CHEM 8, ED
BUN: 13 mg/dL (ref 6–20)
CHLORIDE: 104 mmol/L (ref 101–111)
Calcium, Ion: 1.17 mmol/L (ref 1.12–1.23)
Creatinine, Ser: 1 mg/dL (ref 0.61–1.24)
GLUCOSE: 124 mg/dL — AB (ref 65–99)
HCT: 43 % (ref 39.0–52.0)
HEMOGLOBIN: 14.6 g/dL (ref 13.0–17.0)
Potassium: 3.9 mmol/L (ref 3.5–5.1)
Sodium: 140 mmol/L (ref 135–145)
TCO2: 21 mmol/L (ref 0–100)

## 2015-03-03 LAB — I-STAT TROPONIN, ED: Troponin i, poc: 0 ng/mL (ref 0.00–0.08)

## 2015-03-03 LAB — TROPONIN I: Troponin I: <0.03 ng/mL (ref <0.031)

## 2015-03-03 MED ORDER — ASPIRIN EC 81 MG PO TBEC
81.0000 mg | DELAYED_RELEASE_TABLET | Freq: Every day | ORAL | Status: DC
  Administered 2015-03-04 – 2015-03-05 (×2): 81 mg via ORAL
  Filled 2015-03-03 (×2): qty 1

## 2015-03-03 MED ORDER — NITROGLYCERIN 0.4 MG SL SUBL
0.4000 mg | SUBLINGUAL_TABLET | Freq: Once | SUBLINGUAL | Status: AC
  Administered 2015-03-03: 0.4 mg via SUBLINGUAL
  Filled 2015-03-03: qty 1

## 2015-03-03 MED ORDER — SODIUM CHLORIDE 0.9 % IJ SOLN: 3.0000 mL | INTRAMUSCULAR | Status: DC | PRN

## 2015-03-03 MED ORDER — AMLODIPINE BESYLATE 10 MG PO TABS
10.0000 mg | ORAL_TABLET | Freq: Every day | ORAL | Status: DC
  Administered 2015-03-03 – 2015-03-05 (×3): 10 mg via ORAL
  Filled 2015-03-03 (×3): qty 1

## 2015-03-03 MED ORDER — METOPROLOL TARTRATE 12.5 MG HALF TABLET
12.5000 mg | ORAL_TABLET | Freq: Two times a day (BID) | ORAL | Status: DC
  Administered 2015-03-03 – 2015-03-05 (×4): 12.5 mg via ORAL
  Filled 2015-03-03 (×5): qty 1

## 2015-03-03 MED ORDER — SODIUM CHLORIDE 0.9 % IV SOLN: 250.0000 mL | INTRAVENOUS | Status: DC | PRN

## 2015-03-03 MED ORDER — PANTOPRAZOLE SODIUM 40 MG PO TBEC
40.0000 mg | DELAYED_RELEASE_TABLET | Freq: Every day | ORAL | Status: DC
  Administered 2015-03-03 – 2015-03-05 (×3): 40 mg via ORAL
  Filled 2015-03-03 (×2): qty 1

## 2015-03-03 MED ORDER — ACETAMINOPHEN 325 MG PO TABS
650.0000 mg | ORAL_TABLET | ORAL | Status: DC | PRN
  Administered 2015-03-03 – 2015-03-04 (×2): 650 mg via ORAL
  Filled 2015-03-03 (×2): qty 2

## 2015-03-03 MED ORDER — ONDANSETRON HCL 4 MG/2ML IJ SOLN
4.0000 mg | Freq: Four times a day (QID) | INTRAMUSCULAR | Status: DC | PRN
  Administered 2015-03-04: 4 mg via INTRAVENOUS
  Filled 2015-03-03: qty 2

## 2015-03-03 MED ORDER — SODIUM CHLORIDE 0.9 % IJ SOLN
3.0000 mL | Freq: Two times a day (BID) | INTRAMUSCULAR | Status: DC
  Administered 2015-03-03 – 2015-03-05 (×4): 3 mL via INTRAVENOUS

## 2015-03-03 MED ORDER — APIXABAN 5 MG PO TABS
5.0000 mg | ORAL_TABLET | Freq: Two times a day (BID) | ORAL | Status: DC
  Administered 2015-03-03 – 2015-03-05 (×4): 5 mg via ORAL
  Filled 2015-03-03 (×5): qty 1

## 2015-03-03 MED ORDER — NITROGLYCERIN 0.4 MG SL SUBL: 0.4000 mg | SUBLINGUAL_TABLET | SUBLINGUAL | Status: DC | PRN

## 2015-03-03 NOTE — H&P (Signed)
Physician History and Physical    Bennie Chirico MRN: [id number] DOB/AGE: Nov 15, 1965 49 y.o. Admit date: 03/03/2015  Primary Cardiologist: Nahser  HPI: 49 yo with history of HTN, OSA, paroxysmal atrial fibrillation presented to ER with chest pain.  Patient reports that he spent about a day in atrial fibrillation.  He knows he is in atrial fibrillation because he gets short of breath and feels his heart beat fast and irregular.  He went back into NSR this morning after he got out of bed.  After going back into NSR, he noted sharp pain in the center of his chest.  This lasted a few minutes, resolved, then came back again. He has had on and off sharp pain across his chest all morning.  It is moderate when it occurs.  It is beginning to come back again.  Troponin negative, ECG showed no acute abnormalities (he is in NSR).    Patient has no history of CAD.  He had a low risk Cardiolite in 2012 with inferior attenuation.  He has had paroxysmal atrial fibrillation for a while and is on Eliquis.  He feels like episodes are getting more frequent, gets one about every month (heart racing + exertional dyspnea).  He is not on an anti-arrhythmic.   Review of systems complete and found to be negative unless listed above   PMH: 1. HTN 2. Gout 3. GERD 4. OSA on CPAP 5. Hyperlipidemia 6. Atrial fibrillation: Paroxysmal.  Echo (8/15) with EF 55-60%, mild LVH, moderate diastolic dysfunction, mildly dilated RV with normal systolic function.   FH:  CAD  History   Social History  . Marital Status: Married    Spouse Name: N/A  . Number of Children: N/A  . Years of Education: N/A   Occupational History  . Not on file.   Social History Main Topics  . Smoking status: Former Smoker    Types: Cigarettes    Quit date: 04/02/1994  . Smokeless tobacco: Never Used  . Alcohol Use: No  . Drug Use: No  . Sexual Activity: Not on file   Other Topics Concern  . Not on file   Social History Narrative    No  current facility-administered medications for this encounter.   Current Outpatient Prescriptions  Medication Sig Dispense Refill  . amLODipine (NORVASC) 10 MG tablet TAKE ONE TABLET BY MOUTH ONCE DAILY **NEEDS  APPOINTMENT  WITH  PRIMARY  CARE  DOCTOR  BEFORE  NEXT  REFILL** 30 tablet 5  . aspirin-acetaminophen-caffeine (EXCEDRIN MIGRAINE) [phone] MG per tablet Take 2 tablets by mouth every 6 (six) hours as needed for headache.    Marland Kitchen ELIQUIS 5 MG TABS tablet TAKE ONE TABLET BY MOUTH TWICE DAILY. 60 tablet 5  . ibuprofen (ADVIL,MOTRIN) 600 MG tablet Take 1 tablet (600 mg total) by mouth every 8 (eight) hours as needed. (Patient taking differently: Take 600 mg by mouth every 8 (eight) hours as needed for moderate pain. ) 30 tablet 0  . MITIGARE 0.6 MG CAPS Take 1 tablet by mouth 2 (two) times daily as needed (gout). 30 capsule 1  . Omega-3 Fatty Acids (FISH OIL) 1000 MG CAPS Take 2,000 mg by mouth daily.     Marland Kitchen omeprazole (PRILOSEC) 20 MG capsule TAKE ONE CAPSULE BY MOUTH TWICE DAILY 60 capsule 3  . cetirizine (ZYRTEC) 10 MG tablet Take 1 tablet (10 mg total) by mouth daily. (Patient not taking: Reported on 11/05/2014) 30 tablet 3  . clindamycin (CLEOCIN) 150 MG capsule  Take 3 capsules (450 mg total) by mouth 3 (three) times daily. (Patient not taking: Reported on 03/03/2015) 63 capsule 0  . doxycycline (VIBRA-TABS) 100 MG tablet Take 1 tablet (100 mg total) by mouth 2 (two) times daily. (Patient not taking: Reported on 03/03/2015) 20 tablet 0  . oxymetazoline (AFRIN) 0.05 % nasal spray Place 1 spray into both nostrils 2 (two) times daily. (Patient not taking: Reported on 11/05/2014) 30 mL 0  . propranolol (INDERAL) 10 MG tablet Take 1 tablet (10 mg total) by mouth 4 (four) times daily as needed. (Patient not taking: Reported on 03/03/2015) 90 tablet 3  . traMADol (ULTRAM) 50 MG tablet Take 1-2 tablets (50-100 mg total) by mouth every 8 (eight) hours as needed. (Patient not taking: Reported on 03/03/2015) 30  tablet 0     Physical Exam: Blood pressure 129/80, pulse 50, temperature 97.4 F (36.3 C), temperature source Oral, resp. rate 16, height 6\' 2"  (1.88 m), weight 329 lb (149.233 kg), SpO2 96 %.  General: NAD Neck: No JVD, no thyromegaly or thyroid nodule.  Lungs: Clear to auscultation bilaterally with normal respiratory effort. CV: Nondisplaced PMI.  Heart regular S1/S2, no S3/S4, no murmur.  No peripheral edema.  No carotid bruit.  Normal pedal pulses.  Abdomen: Soft, nontender, no hepatosplenomegaly, no distention.  Skin: Intact without lesions or rashes.  Neurologic: Alert and oriented x 3.  Psych: Normal affect. Extremities: No clubbing or cyanosis.  HEENT: Normal.   Labs:   Lab Results  Component Value Date   WBC 9.5 11/05/2014   HGB 14.6 03/03/2015   HCT 43.0 03/03/2015   MCV 83.5 11/05/2014   PLT 246 11/05/2014    Recent Labs Lab 03/03/15 0836  NA 140  K 3.9  CL 104  BUN 13  CREATININE 1.00  GLUCOSE 124*   Lab Results  Component Value Date   CKTOTAL 278* 09/06/2010   CKMB 2.9 09/06/2010   TROPONINI <0.30 03/14/2014    No results found for: LDLDIRECT    Radiology: - CXR: no acute changes  EKG: NSR, normal  ASSESSMENT AND PLAN: 48 yo with history of HTN, OSA, paroxysmal atrial fibrillation presented to ER with chest pain.  Patient reports that he spent about a day in atrial fibrillation. 1. Chest pain: Mr Hartung's chest pain is atypical (sharp, lasts few minutes, resolves).  Initial cardiac enzymes negative, no changes on ECG.  Negative Cardiolite in 2012 (inferior attenuation).   - Observe overnight, if troponin remains negative, would get Cardiolite (ordered).  2. Atrial fibrillation: This actually seems to be his greater problem.  He had an episode of atrial fibrillation for about 24 hrs ending this morning.  He gets very symptomatic with dyspnea and tachypalpitations.  Afib is happening more often, about once a month.  RFs are obesity, HTN, OSA.   -  Continue Eliquis.  - I think that we ought to try to maintain him in NSR.  If his Cardiolite is normal, he would be a candidate for a Ic anti-arrhythmic, would use flecainide.  However, will not be able to use flecainide unless he can also tolerate at least a low dose nodal blocker, so will start metoprolol 12.5 mg bid today (will have to watch closely, HR in high 50s currently). If he cannot take metoprolol, could use dronedarone as a single agent rather than flecainide to maintain NSR, but may have issues with insurance coverage.  3. OSA: Continue CPAP.   Signed: Loralie Champagne 03/03/2015, 11:14 AM

## 2015-03-03 NOTE — ED Notes (Signed)
Pt. Stated, I just went out of A-fib and then I started having a sharp pain in my chest with SOB.

## 2015-03-03 NOTE — ED Provider Notes (Signed)
CSN: [id number]     Arrival date & time 03/03/15  0806 History   First MD Initiated Contact with Patient 03/03/15 0825     Chief Complaint  Patient presents with  . Chest Pain  . Shortness of Breath      HPI Patient presents with chest pain which started this morning.  Associated with shortness of breath.  Some diaphoresis.  History of A. fib.  On Ella Quist.  No history of acute coronary syndrome. Past Medical History  Diagnosis Date  . Arthritis   . 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 History of MRI of cervical spine 09/25/10    normal; left shoulder normal as well  . Gout   . Chronic knee pain   . Chronic foot pain   . Hypertension   . Chronic elbow pain   . Chronic shoulder pain   . Chronic hip pain   . OSA on CPAP   . GERD (gastroesophageal reflux disease)   . Migraine     "about monthly" (03/03/2015)  . Chronic lower back pain    Past Surgical History  Procedure Laterality Date  . Knee arthroscopy Right 02/2013  . Cardiac catheterization  05/2005    "no blockage"   History reviewed. No pertinent family history. History  Substance Use Topics  . Smoking status: Former Smoker -- 1.00 packs/day for 10 years    Types: Cigarettes    Quit date: 04/02/1994  . Smokeless tobacco: Former Systems developer    Types: Chew     Comment: "chewed in my teens"  . Alcohol Use: Yes     Comment: 03/03/2015 "stopped in 1994"    Review of Systems  Constitutional: Positive for diaphoresis. Negative for fever and chills.  Respiratory: Positive for shortness of breath.   Cardiovascular: Positive for chest pain.  All other systems reviewed and are negative.     Allergies  Hydrochlorothiazide-propranolol and Penicillins  Home Medications   Prior to Admission medications   Medication Sig Start Date End Date Taking? Authorizing Provider  amLODipine (NORVASC) 10 MG tablet TAKE ONE TABLET BY MOUTH ONCE DAILY **NEEDS   APPOINTMENT  WITH  PRIMARY  CARE  DOCTOR  BEFORE  NEXT  REFILL** 11/23/14  Yes [name], MD  aspirin-acetaminophen-caffeine ([place]) [phone] MG per tablet Take 2 tablets by mouth every 6 (six) hours as needed for headache.   Yes Historical Provider, MD  ELIQUIS 5 MG TABS tablet TAKE ONE TABLET BY MOUTH TWICE DAILY. 12/01/14  Yes [name], MD  ibuprofen (ADVIL,MOTRIN) 600 MG tablet Take 1 tablet (600 mg total) by mouth every 8 (eight) hours as needed. Patient taking differently: Take 600 mg by mouth every 8 (eight) hours as needed for moderate pain.  11/04/14  Yes [name], MD  MITIGARE 0.6 MG CAPS Take 1 tablet by mouth 2 (two) times daily as needed (gout). 02/24/15  Yes [name], MD  Omega-3 Fatty Acids (FISH OIL) 1000 MG CAPS Take 2,000 mg by mouth daily.    Yes Historical Provider, MD  omeprazole (PRILOSEC) 20 MG capsule TAKE ONE CAPSULE BY MOUTH TWICE DAILY 01/23/15  Yes [name], MD  cetirizine (ZYRTEC) 10 MG tablet Take 1 tablet (10 mg total) by mouth daily. Patient not taking: Reported on 11/05/2014 06/16/14   Patrecia Pour, MD  clindamycin (CLEOCIN) 150 MG capsule Take 3 capsules (450 mg total) by mouth 3 (  three) times daily. Patient not taking: Reported on 03/03/2015 11/05/14   Serita Grit, MD  doxycycline (VIBRA-TABS) 100 MG tablet Take 1 tablet (100 mg total) by mouth 2 (two) times daily. Patient not taking: Reported on 03/03/2015 11/04/14   [name], MD  oxymetazoline (AFRIN) 0.05 % nasal spray Place 1 spray into both nostrils 2 (two) times daily. Patient not taking: Reported on 11/05/2014 06/16/14   Patrecia Pour, MD  propranolol (INDERAL) 10 MG tablet Take 1 tablet (10 mg total) by mouth 4 (four) times daily as needed. Patient not taking: Reported on 03/03/2015 11/30/14   Thayer Headings, MD  traMADol (ULTRAM) 50 MG tablet Take 1-2 tablets (50-100 mg total) by mouth every 8 (eight) hours as needed. Patient not taking:  Reported on 03/03/2015 10/13/14   Timmothy Euler, MD   BP 128/71 mmHg  Pulse 88  Temp(Src) 97.7 F (36.5 C) (Oral)  Resp 18  Ht 6\' 2"  (1.88 m)  Wt 327 lb 6.4 oz (148.508 kg)  BMI 42.02 kg/m2  SpO2 100% Physical Exam Physical Exam  Nursing note and vitals reviewed. Constitutional: He is oriented to person, place, and time. He appears well-developed and well-nourished. No distress.  HENT:  Head: Normocephalic and atraumatic.  Eyes: Pupils are equal, round, and reactive to light.  Neck: Normal range of motion.  Cardiovascular: Normal rate and intact distal pulses.   Pulmonary/Chest: No respiratory distress.  Abdominal: Normal appearance. He exhibits no distension.  Musculoskeletal: Normal range of motion.  Neurological: He is alert and oriented to person, place, and time. No cranial nerve deficit.  Skin: Skin is warm and dry. No rash noted.  Psychiatric: He has a normal mood and affect. His behavior is normal.   ED Course  Procedures (including critical care time) Medications  pantoprazole (PROTONIX) EC tablet 40 mg (40 mg Oral Given 03/03/15 1844)  apixaban (ELIQUIS) tablet 5 mg (5 mg Oral Given 03/03/15 2147)  amLODipine (NORVASC) tablet 10 mg (10 mg Oral Given 03/03/15 2146)  aspirin EC tablet 81 mg (not administered)  nitroGLYCERIN (NITROSTAT) SL tablet 0.4 mg (not administered)  acetaminophen (TYLENOL) tablet 650 mg (650 mg Oral Given 03/03/15 1416)  ondansetron (ZOFRAN) injection 4 mg (not administered)  sodium chloride 0.9 % injection 3 mL (3 mLs Intravenous Given 03/03/15 2147)  sodium chloride 0.9 % injection 3 mL (not administered)  0.9 %  sodium chloride infusion (not administered)  metoprolol tartrate (LOPRESSOR) tablet 12.5 mg (12.5 mg Oral Given 03/03/15 2146)  nitroGLYCERIN (NITROSTAT) SL tablet 0.4 mg (0.4 mg Sublingual Given 03/03/15 0840)    Cardiology consulted. Labs Review Labs Reviewed  HEMOGLOBIN A1C - Abnormal; Notable for the following:    Hgb A1c MFr  Bld 5.7 (*)    All other components within normal limits  BASIC METABOLIC PANEL - Abnormal; Notable for the following:    Glucose, Bld 113 (*)    All other components within normal limits  LIPID PANEL - Abnormal; Notable for the following:    Triglycerides 442 (*)    HDL 25 (*)    All other components within normal limits  I-STAT CHEM 8, ED - Abnormal; Notable for the following:    Glucose, Bld 124 (*)    All other components within normal limits  TROPONIN I  TROPONIN I  TROPONIN I  CBC  I-STAT TROPOININ, ED    Imaging Review Dg Chest Portable 1 View  03/03/2015   CLINICAL DATA:  49 year old male with chest pain and  shortness of breath  EXAM: PORTABLE CHEST - 1 VIEW  COMPARISON:  Prior chest x-ray and CT scan of the chest 10/12/2014  FINDINGS: The lungs are clear and negative for focal airspace consolidation, pulmonary edema or suspicious pulmonary nodule. No pleural effusion or pneumothorax. Cardiac and mediastinal contours are within normal limits. No acute fracture or lytic or blastic osseous lesions. The visualized upper abdominal bowel gas pattern is unremarkable.  IMPRESSION: No active cardiopulmonary disease.   Electronically Signed   By: Jacqulynn Cadet M.D.   On: 03/03/2015 08:53     EKG Interpretation   Date/Time:  Friday March 03 2015 08:14:36 EDT Ventricular Rate:  64 PR Interval:  192 QRS Duration: 94 QT Interval:  374 QTC Calculation: 385 R Axis:   10 Text Interpretation:  Undetermined rhythm Otherwise normal ECG No  significant change since last tracing Confirmed by Dantavious Snowball  MD, Brittane Grudzinski  ([id number]) on 03/03/2015 8:21:06 AM      MDM   Final diagnoses:  Chest pain  Other chest pain        Leonard Schwartz, MD 03/04/15 [phone]

## 2015-03-04 ENCOUNTER — Observation Stay (HOSPITAL_COMMUNITY): Payer: 59

## 2015-03-04 DIAGNOSIS — R079 Chest pain, unspecified: Secondary | ICD-10-CM

## 2015-03-04 DIAGNOSIS — I10 Essential (primary) hypertension: Secondary | ICD-10-CM | POA: Diagnosis not present

## 2015-03-04 DIAGNOSIS — G4733 Obstructive sleep apnea (adult) (pediatric): Secondary | ICD-10-CM | POA: Diagnosis not present

## 2015-03-04 DIAGNOSIS — Z79899 Other long term (current) drug therapy: Secondary | ICD-10-CM

## 2015-03-04 DIAGNOSIS — I481 Persistent atrial fibrillation: Secondary | ICD-10-CM | POA: Diagnosis not present

## 2015-03-04 DIAGNOSIS — E662 Morbid (severe) obesity with alveolar hypoventilation: Secondary | ICD-10-CM | POA: Diagnosis not present

## 2015-03-04 DIAGNOSIS — R002 Palpitations: Secondary | ICD-10-CM | POA: Diagnosis not present

## 2015-03-04 DIAGNOSIS — I480 Paroxysmal atrial fibrillation: Secondary | ICD-10-CM | POA: Diagnosis not present

## 2015-03-04 LAB — CBC
HEMATOCRIT: 41.5 % (ref 39.0–52.0)
HEMOGLOBIN: 13.7 g/dL (ref 13.0–17.0)
MCH: 27.5 pg (ref 26.0–34.0)
MCHC: 33 g/dL (ref 30.0–36.0)
MCV: 83.3 fL (ref 78.0–100.0)
Platelets: 210 10*3/uL (ref 150–400)
RBC: 4.98 MIL/uL (ref 4.22–5.81)
RDW: 13.2 % (ref 11.5–15.5)
WBC: 7.2 10*3/uL (ref 4.0–10.5)

## 2015-03-04 LAB — BASIC METABOLIC PANEL
Anion gap: 6 (ref 5–15)
BUN: 10 mg/dL (ref 6–20)
CALCIUM: 9 mg/dL (ref 8.9–10.3)
CO2: 27 mmol/L (ref 22–32)
Chloride: 106 mmol/L (ref 101–111)
Creatinine, Ser: 0.93 mg/dL (ref 0.61–1.24)
Glucose, Bld: 113 mg/dL — ABNORMAL HIGH (ref 65–99)
Potassium: 4.2 mmol/L (ref 3.5–5.1)
SODIUM: 139 mmol/L (ref 135–145)

## 2015-03-04 LAB — TROPONIN I: Troponin I: <0.03 ng/mL (ref <0.031)

## 2015-03-04 LAB — LIPID PANEL
CHOL/HDL RATIO: 6.9 ratio
CHOLESTEROL: 173 mg/dL (ref 0–200)
HDL: 25 mg/dL — ABNORMAL LOW (ref >40)
LDL Cholesterol: UNDETERMINED mg/dL (ref 0–99)
Triglycerides: 442 mg/dL — ABNORMAL HIGH (ref <150)
VLDL: UNDETERMINED mg/dL (ref 0–40)

## 2015-03-04 LAB — HEMOGLOBIN A1C
Hgb A1c MFr Bld: 5.7 % — ABNORMAL HIGH (ref 4.8–5.6)
Mean Plasma Glucose: 117 mg/dL

## 2015-03-04 MED ORDER — HYDROCODONE-ACETAMINOPHEN 5-325 MG PO TABS
1.0000 | ORAL_TABLET | Freq: Four times a day (QID) | ORAL | Status: DC | PRN
Start: 2015-03-04 — End: 2015-03-05
  Administered 2015-03-04 – 2015-03-05 (×2): 1 via ORAL
  Filled 2015-03-04 (×2): qty 1

## 2015-03-04 MED ORDER — REGADENOSON 0.4 MG/5ML IV SOLN
0.4000 mg | Freq: Once | INTRAVENOUS | Status: AC
  Administered 2015-03-04: 0.4 mg via INTRAVENOUS
  Filled 2015-03-04: qty 5

## 2015-03-04 MED ORDER — TECHNETIUM TC 99M SESTAMIBI GENERIC - CARDIOLITE
30.0000 | Freq: Once | INTRAVENOUS | Status: AC | PRN
  Administered 2015-03-04: 30 via INTRAVENOUS

## 2015-03-04 NOTE — Progress Notes (Signed)
Patient Profile: 49 yo with history of HTN, OSA, paroxysmal atrial fibrillation admitted with chest pain. Cardiac enzymes negative x 3.   Subjective: Currently CP free. No dyspnea.   Objective: Vital signs in last 24 hours: Temp:  [97.2 F (36.2 C)-97.7 F (36.5 C)] 97.7 F (36.5 C) (07/30 0607) Pulse Rate:  [48-90] 72 (07/30 0909) Resp:  [11-24] 20 (07/30 0909) BP: (103-159)/(52-89) 132/85 mmHg (07/30 0909) SpO2:  [94 %-100 %] 100 % (07/30 0607) Weight:  [327 lb 2.6 oz (148.4 kg)-327 lb 6.4 oz (148.508 kg)] 327 lb 6.4 oz (148.508 kg) (07/30 0607) Last BM Date: 03/03/15  Intake/Output from previous day: 07/29 0701 - 07/30 0700 In: 600 [P.O.:600] Out: 2100 [Urine:2100] Intake/Output this shift:    Medications Current Facility-Administered Medications  Medication Dose Route Frequency Provider Last Rate Last Dose  . 0.9 %  sodium chloride infusion  250 mL Intravenous PRN Larey Dresser, MD      . acetaminophen (TYLENOL) tablet 650 mg  650 mg Oral Q4H PRN Larey Dresser, MD   650 mg at 03/03/15 1416  . amLODipine (NORVASC) tablet 10 mg  10 mg Oral Daily Larey Dresser, MD   10 mg at 03/03/15 2146  . apixaban (ELIQUIS) tablet 5 mg  5 mg Oral BID Larey Dresser, MD   5 mg at 03/03/15 2147  . aspirin EC tablet 81 mg  81 mg Oral Daily Larey Dresser, MD      . metoprolol tartrate (LOPRESSOR) tablet 12.5 mg  12.5 mg Oral BID Larey Dresser, MD   12.5 mg at 03/03/15 2146  . nitroGLYCERIN (NITROSTAT) SL tablet 0.4 mg  0.4 mg Sublingual Q5 Min x 3 PRN Larey Dresser, MD      . ondansetron [facility]) injection 4 mg  4 mg Intravenous Q6H PRN Larey Dresser, MD      . pantoprazole (PROTONIX) EC tablet 40 mg  40 mg Oral Daily Larey Dresser, MD   40 mg at 03/03/15 1844  . sodium chloride 0.9 % injection 3 mL  3 mL Intravenous Q12H Larey Dresser, MD   3 mL at 03/03/15 2147  . sodium chloride 0.9 % injection 3 mL  3 mL Intravenous PRN Larey Dresser, MD        PE: General  appearance: alert, cooperative and no distress Neck: no carotid bruit and no JVD Lungs: clear to auscultation bilaterally Heart: regular rate and rhythm, S1, S2 normal, no murmur, click, rub or gallop Extremities: no LEE Pulses: 2+ and symmetric Skin: warm and dry Neurologic: Grossly normal  Lab Results:   Recent Labs  03/03/15 0836 03/04/15 0615  WBC  --  7.2  HGB 14.6 13.7  HCT 43.0 41.5  PLT  --  210   BMET  Recent Labs  03/03/15 0836 03/04/15 0615  NA 140 139  K 3.9 4.2  CL 104 106  CO2  --  27  GLUCOSE 124* 113*  BUN 13 10  CREATININE 1.00 0.93  CALCIUM  --  9.0   PT/INR No results for input(s): LABPROT, INR in the last 72 hours. Cholesterol  Recent Labs  03/04/15 0615  CHOL 173   Cardiac Panel (last 3 results)  Recent Labs  03/03/15 1857 03/03/15 2317 03/04/15 0615  TROPONINI <0.03 <0.03 <0.03    Studies/Results: NST- results pending   Assessment/Plan    Active Problems:   Chest pain   1. Chest Pain: currently CP free.  Cardiac enzymes negative x 3. He is scheduled as a 2 day nuc study, but stress portion completed today. ? If resting images needed tomorrow. Will defer to MD. Stress interpretation pending.   2. Atrial fibrillation: See below.  Brittainy M. Ladoris Gene 03/04/2015 9:48 AM  The patient was seen and examined, and I agree with the assessment and plan as documented above, with modifications as noted below. Pt currently chest pain free. Awaiting nuclear stress imaging interpretation. If it is normal, I think flecainide can be added for maintenance of sinus rhythm and he can be discharged with close follow up. He is tolerating low dose metoprolol. Continue CPAP for OSA.

## 2015-03-05 ENCOUNTER — Observation Stay (HOSPITAL_COMMUNITY): Payer: 59

## 2015-03-05 DIAGNOSIS — R002 Palpitations: Secondary | ICD-10-CM

## 2015-03-05 DIAGNOSIS — R079 Chest pain, unspecified: Secondary | ICD-10-CM | POA: Diagnosis not present

## 2015-03-05 DIAGNOSIS — I480 Paroxysmal atrial fibrillation: Secondary | ICD-10-CM | POA: Insufficient documentation

## 2015-03-05 DIAGNOSIS — G4733 Obstructive sleep apnea (adult) (pediatric): Secondary | ICD-10-CM | POA: Diagnosis not present

## 2015-03-05 DIAGNOSIS — Z9989 Dependence on other enabling machines and devices: Secondary | ICD-10-CM

## 2015-03-05 MED ORDER — TECHNETIUM TC 99M SESTAMIBI GENERIC - CARDIOLITE
30.0000 | Freq: Once | INTRAVENOUS | Status: AC | PRN
  Administered 2015-03-05: 32.7 via INTRAVENOUS

## 2015-03-05 MED ORDER — METOPROLOL TARTRATE 25 MG PO TABS: 12.5000 mg | ORAL_TABLET | Freq: Two times a day (BID) | ORAL | Status: DC

## 2015-03-05 NOTE — Progress Notes (Signed)
Patient to self-administer CPAP.  Patient is familiar with equipment and procedure.  Auto-titration mode used, room air.

## 2015-03-05 NOTE — Discharge Summary (Signed)
Physician Discharge Summary     Cardiologist:  Nahser  Patient ID: [name] MRN: [id number] DOB/AGE: 04-10-1966 49 y.o.  Admit date: 03/03/2015 Discharge date: 03/05/2015  Admission Diagnoses:  Paroxysmal atrial fibrillation, chest pain  Discharge Diagnoses:  Active Problems:   Chest pain   OSA on CPAP   Paroxysmal atrial fibrillation   Palpitations   Discharged Condition: stable  Hospital Course:   49 yo with history of HTN, OSA, paroxysmal atrial fibrillation presented to ER with chest pain. Patient reports that he spent about a day in atrial fibrillation. He knows he is in atrial fibrillation because he gets short of breath and feels his heart beat fast and irregular. He went back into NSR this morning after he got out of bed. After going back into NSR, he noted sharp pain in the center of his chest. This lasted a few minutes, resolved, then came back again. He has had on and off sharp pain across his chest all morning. It is moderate when it occurs. It is beginning to come back again. Troponin negative, ECG showed no acute abnormalities (he is in NSR).   Patient has no history of CAD. He had a low risk Cardiolite in 2012 with inferior attenuation. He has had paroxysmal atrial fibrillation for a while and is on Eliquis. He feels like episodes are getting more frequent, gets one about every month (heart racing + exertional dyspnea). He is not on an anti-arrhythmic.   Patient was admitted for observation. He underwent cardiac stress testing with a 2 day study which was nonischemic and considered low risk with an ejection fraction in the 55% range. There was a large defect of moderate severity in the basal inferior, basal inferolateral, mid infero lateral, mid inferior and apical inferior locations.  We elected not to start flecainide but to continue metoprolol 12.5 mg twice daily.  CHADSVASC = 1.  He is on Eliquis for anticoagulation.  He'll be scheduled for follow-up in  atrial fibrillation clinic.  Consider using Multaq. The patient was seen by Dr. Jacinta Leblanc who felt he was stable for DC home.   Consults: None  Significant Diagnostic Studies:   Cardiac stress test  Defect 1: There is a large defect of moderate severity present in the basal inferior, basal inferolateral, mid inferior, mid inferolateral and apical inferior location.  The study is normal.  This is a low risk study.  The left ventricular ejection fraction is normal (55-65%).  Nuclear stress EF: 55%.  Treatments: See above  Discharge Exam: Blood pressure 112/69, pulse 68, temperature 97.4 F (36.3 C), temperature source Oral, resp. rate 18, height 6\' 2"  (1.88 m), weight 329 lb 3.2 oz (149.324 kg), SpO2 98 %.   Disposition: 01-Home or Self Care      Discharge Instructions    Diet - low sodium heart healthy    Complete by:  As directed             Medication List    STOP taking these medications        cetirizine 10 MG tablet  Commonly known as:  ZYRTEC     clindamycin 150 MG capsule  Commonly known as:  CLEOCIN     doxycycline 100 MG tablet  Commonly known as:  VIBRA-TABS     oxymetazoline 0.05 % nasal spray  Commonly known as:  AFRIN     propranolol 10 MG tablet  Commonly known as:  INDERAL     traMADol 50 MG tablet  Commonly known  as:  ULTRAM      TAKE these medications        amLODipine 10 MG tablet  Commonly known as:  NORVASC  TAKE ONE TABLET BY MOUTH ONCE DAILY **NEEDS  APPOINTMENT  WITH  PRIMARY  CARE  DOCTOR  BEFORE  NEXT  REFILL**     aspirin-acetaminophen-caffeine [phone] MG per tablet  Commonly known as:  EXCEDRIN MIGRAINE  Take 2 tablets by mouth every 6 (six) hours as needed for headache.     ELIQUIS 5 MG Tabs tablet  Generic drug:  apixaban  TAKE ONE TABLET BY MOUTH TWICE DAILY.     Fish Oil 1000 MG Caps  Take 2,000 mg by mouth daily.     ibuprofen 600 MG tablet  Commonly known as:  ADVIL,MOTRIN  Take 1 tablet (600 mg total)  by mouth every 8 (eight) hours as needed.     metoprolol tartrate 25 MG tablet  Commonly known as:  LOPRESSOR  Take 0.5 tablets (12.5 mg total) by mouth 2 (two) times daily.     MITIGARE 0.6 MG Caps  Generic drug:  Colchicine  Take 1 tablet by mouth 2 (two) times daily as needed (gout).     omeprazole 20 MG capsule  Commonly known as:  PRILOSEC  TAKE ONE CAPSULE BY MOUTH TWICE DAILY       Follow-up Information    Follow up with Leblanc,DONNA, NP.   Specialties:  Nurse Practitioner, Cardiology   Why:  The office scheduler will call you with your appointment date and time   Contact information:   [place] [phone]      Greater than 30 minutes was spent completing the patient's discharge.    SignedTarri Fuller, PA-C 03/05/2015, 11:23 AM

## 2015-03-05 NOTE — Progress Notes (Addendum)
SUBJECTIVE: Just finished day 2 of stress test. Denies chest pain.     Intake/Output Summary (Last 24 hours) at 03/05/15 0945 Last data filed at 03/05/15 0650  Gross per 24 hour  Intake    840 ml  Output   1000 ml  Net   -160 ml    Current Facility-Administered Medications  Medication Dose Route Frequency Provider Last Rate Last Dose  . 0.9 %  sodium chloride infusion  250 mL Intravenous PRN Larey Dresser, MD      . acetaminophen (TYLENOL) tablet 650 mg  650 mg Oral Q4H PRN Larey Dresser, MD   650 mg at 03/04/15 1009  . amLODipine (NORVASC) tablet 10 mg  10 mg Oral Daily Larey Dresser, MD   10 mg at 03/04/15 1009  . apixaban (ELIQUIS) tablet 5 mg  5 mg Oral BID Larey Dresser, MD   5 mg at 03/04/15 2134  . aspirin EC tablet 81 mg  81 mg Oral Daily Larey Dresser, MD   81 mg at 03/04/15 1009  . HYDROcodone-acetaminophen (NORCO/VICODIN) 5-325 MG per tablet 1 tablet  1 tablet Oral Q6H PRN Larey Dresser, MD   1 tablet at 03/04/15 1422  . metoprolol tartrate (LOPRESSOR) tablet 12.5 mg  12.5 mg Oral BID Larey Dresser, MD   12.5 mg at 03/04/15 2134  . nitroGLYCERIN (NITROSTAT) SL tablet 0.4 mg  0.4 mg Sublingual Q5 Min x 3 PRN Larey Dresser, MD      . ondansetron [facility]) injection 4 mg  4 mg Intravenous Q6H PRN Larey Dresser, MD   4 mg at 03/04/15 1314  . pantoprazole (PROTONIX) EC tablet 40 mg  40 mg Oral Daily Larey Dresser, MD   40 mg at 03/04/15 1009  . sodium chloride 0.9 % injection 3 mL  3 mL Intravenous Q12H Larey Dresser, MD   3 mL at 03/04/15 2134  . sodium chloride 0.9 % injection 3 mL  3 mL Intravenous PRN Larey Dresser, MD        Filed Vitals:   03/04/15 1009 03/04/15 1445 03/04/15 2039 03/05/15 0536  BP: 122/57 106/92 115/62 112/69  Pulse:  65 67 68  Temp:  97.8 F (36.6 C) 97.6 F (36.4 C) 97.4 F (36.3 C)  TempSrc:  Oral Oral Oral  Resp:  20 18 18   Height:      Weight:    329 lb 3.2 oz (149.324 kg)  SpO2:  96% 98% 98%    PHYSICAL  EXAM General: NAD HEENT: Normal. Neck: No JVD, no thyromegaly.  Lungs: Clear to auscultation bilaterally with normal respiratory effort. CV: Nondisplaced PMI.  Regular rate and rhythm, normal S1/S2, no S3/S4, no murmur.  No pretibial edema.   Abdomen: Firm, obese.  Neurologic: Alert and oriented x 3.  Psych: Normal affect. Musculoskeletal: No gross deformities. Extremities: No clubbing or cyanosis.   TELEMETRY: Reviewed telemetry pt in sinus rhythm.  LABS: Basic Metabolic Panel:  Recent Labs  03/03/15 0836 03/04/15 0615  NA 140 139  K 3.9 4.2  CL 104 106  CO2  --  27  GLUCOSE 124* 113*  BUN 13 10  CREATININE 1.00 0.93  CALCIUM  --  9.0   Liver Function Tests: No results for input(s): AST, ALT, ALKPHOS, BILITOT, PROT, ALBUMIN in the last 72 hours. No results for input(s): LIPASE, AMYLASE in the last 72 hours. CBC:  Recent Labs  03/03/15 0836  03/04/15 0615  WBC  --  7.2  HGB 14.6 13.7  HCT 43.0 41.5  MCV  --  83.3  PLT  --  210   Cardiac Enzymes:  Recent Labs  03/03/15 1857 03/03/15 2317 03/04/15 0615  TROPONINI <0.03 <0.03 <0.03   BNP: Invalid input(s): POCBNP D-Dimer: No results for input(s): DDIMER in the last 72 hours. Hemoglobin A1C:  Recent Labs  03/03/15 1851  HGBA1C 5.7*   Fasting Lipid Panel:  Recent Labs  03/04/15 0615  CHOL 173  HDL 25*  LDLCALC UNABLE TO CALCULATE IF TRIGLYCERIDE OVER 400 mg/dL  TRIG 442*  CHOLHDL 6.9   Thyroid Function Tests: No results for input(s): TSH, T4TOTAL, T3FREE, THYROIDAB in the last 72 hours.  Invalid input(s): FREET3 Anemia Panel: No results for input(s): VITAMINB12, FOLATE, FERRITIN, TIBC, IRON, RETICCTPCT in the last 72 hours.  RADIOLOGY: Dg Chest Portable 1 View  03/03/2015   CLINICAL DATA:  49 year old male with chest pain and shortness of breath  EXAM: PORTABLE CHEST - 1 VIEW  COMPARISON:  Prior chest x-ray and CT scan of the chest 10/12/2014  FINDINGS: The lungs are clear and negative  for focal airspace consolidation, pulmonary edema or suspicious pulmonary nodule. No pleural effusion or pneumothorax. Cardiac and mediastinal contours are within normal limits. No acute fracture or lytic or blastic osseous lesions. The visualized upper abdominal bowel gas pattern is unremarkable.  IMPRESSION: No active cardiopulmonary disease.   Electronically Signed   By: Jacqulynn Cadet M.D.   On: 03/03/2015 08:53      ASSESSMENT AND PLAN: 1. Chest pain: Ruled out for ACS. Awaiting final results of nuclear stress test. Will likely d/c later today if no large ischemic territories.  2. Atrial fibrillation: Tolerating low-dose metoprolol. Quite symptomatic from a fib, but currently in sinus rhythm. If his Cardiolite is normal, he would be a candidate for a class Ic anti-arrhythmic, and would use flecainide.If not, consider Multaq. On Eliquis for anticoagulation.  3. OSA: Continue CPAP.   Kate Sable, M.D., F.A.C.C.   ADDENDUM: No ischemia noted on stress testing. Large defect noted but may be due to artifact (no mention of myocardial scar). Can be discharged on low dose metoprolol with EP follow up.

## 2015-03-21 ENCOUNTER — Encounter: Payer: Self-pay | Admitting: Family Medicine

## 2015-03-21 ENCOUNTER — Ambulatory Visit (HOSPITAL_COMMUNITY)
Admission: RE | Admit: 2015-03-21 | Discharge: 2015-03-21 | Disposition: A | Discharge status: Home / Self Care | Payer: 59 | Source: Ambulatory Visit | Attending: Family Medicine | Admitting: Family Medicine

## 2015-03-21 ENCOUNTER — Ambulatory Visit (INDEPENDENT_AMBULATORY_CARE_PROVIDER_SITE_OTHER): Payer: 59 | Admitting: Family Medicine

## 2015-03-21 ENCOUNTER — Other Ambulatory Visit: Payer: Self-pay

## 2015-03-21 VITALS — BP 126/74 | HR 52 | Temp 97.7°F | Ht 74.0 in | Wt 329.4 lb

## 2015-03-21 DIAGNOSIS — R0602 Shortness of breath: Secondary | ICD-10-CM | POA: Diagnosis not present

## 2015-03-21 DIAGNOSIS — I440 Atrioventricular block, first degree: Secondary | ICD-10-CM | POA: Diagnosis not present

## 2015-03-21 DIAGNOSIS — R42 Dizziness and giddiness: Secondary | ICD-10-CM | POA: Diagnosis not present

## 2015-03-21 DIAGNOSIS — R079 Chest pain, unspecified: Secondary | ICD-10-CM

## 2015-03-21 DIAGNOSIS — R001 Bradycardia, unspecified: Secondary | ICD-10-CM | POA: Insufficient documentation

## 2015-03-21 HISTORY — DX: Dizziness and giddiness: R42

## 2015-03-21 HISTORY — DX: Shortness of breath: R06.02

## 2015-03-21 NOTE — Patient Instructions (Signed)
It was nice seeing you today. I am sorry you don't feel well. Your EKG did not show any heart attack however your heart rate is pretty low which could have contributed to your dizziness and SOB. Please change your metoprolol to 12.5 mg daily for now, continue checking your BP and Heart rate at home, if still less than 58-60 please hold your metoprolol all together. See your PCP in 1-2 wks for reassessment and follow up with cardiologist as planned. If symptoms worsens please go to the emergency.

## 2015-03-21 NOTE — Assessment & Plan Note (Signed)
Currently asymptomatic. O2 sat on RA was 97%. Pul exam today completely normal. ?? Related to his low HR vs hx of OSA. Continue CPAP at home. Return precaution discussed.

## 2015-03-21 NOTE — Assessment & Plan Note (Signed)
No fall or LOC. Currently asymptomatic. He attributed his dizziness to Norvasc hence he self d/c it. EKG showed sinus brady with 1st degree AV block. Low HR might be related to dizziness. Advised to cut back on his Metoprolol to 12.5 daily from BID. Continue home BP and HR monitoring, if HR remains less than 58-60, may hold Metoprolol all together. He might benefic from an antiarrymias instead but currently he is rhythm controlled. I will defer further management to his cardiologist. Return precaution discussed. Follow up with PCP in 1-2 wks or sooner for reassessment.

## 2015-03-21 NOTE — Progress Notes (Signed)
Subjective:     Patient ID: [name], male   DOB: 1965/10/11, 49 y.o.   MRN: [id number]  Chest Pain  This is a new problem. The current episode started today. The onset quality is sudden (He woke up with pain this morning). The problem occurs constantly. The problem has been unchanged. The pain is present in the lateral region (Left sided pressure on his chest radiating to the left shoulder). The pain is at a severity of 4/10. The pain is moderate. The quality of the pain is described as pressure. The pain radiates to the left shoulder. Associated symptoms include dizziness, irregular heartbeat, orthopnea, palpitations and shortness of breath. Pertinent negatives include no fever, headaches, leg pain, lower extremity edema, nausea, near-syncope, numbness, PND, syncope or vomiting. Associated symptoms comments: A little cough. Risk factors include male gender, obesity and smoking/tobacco exposure (One of his friend's wife got shot and killed, he is angry about this, he is not sure if this is affecting him. He got a 35 min work-out done yesterday).  Pertinent negatives for past medical history include no anxiety/panic attacks and no recent injury.  Dizziness This is a new problem. The current episode started today. The problem occurs constantly. The problem has been unchanged. Associated symptoms include chest pain. Pertinent negatives include no fever, headaches, nausea, numbness or vomiting. Nothing (He feels dizzy both on standing and sitting) aggravates the symptoms. He has tried nothing for the symptoms.  Shortness of Breath This is a new problem. The current episode started today. The problem occurs intermittently. The problem has been rapidly improving. Associated symptoms include chest pain and orthopnea. Pertinent negatives include no fever, headaches, leg pain, PND, syncope or vomiting. The symptoms are aggravated by exercise. Associated symptoms comments: CPAP machine helps.    Current Outpatient  Prescriptions on File Prior to Visit  Medication Sig Dispense Refill  . ELIQUIS 5 MG TABS tablet TAKE ONE TABLET BY MOUTH TWICE DAILY. 60 tablet 5  . metoprolol tartrate (LOPRESSOR) 25 MG tablet Take 0.5 tablets (12.5 mg total) by mouth 2 (two) times daily. 30 tablet 11  . Omega-3 Fatty Acids (FISH OIL) 1000 MG CAPS Take 2,000 mg by mouth daily.     Marland Kitchen omeprazole (PRILOSEC) 20 MG capsule TAKE ONE CAPSULE BY MOUTH TWICE DAILY 60 capsule 3  . amLODipine (NORVASC) 10 MG tablet TAKE ONE TABLET BY MOUTH ONCE DAILY **NEEDS  APPOINTMENT  WITH  PRIMARY  CARE  DOCTOR  BEFORE  NEXT  REFILL** (Patient not taking: Reported on 03/21/2015) 30 tablet 5  . aspirin-acetaminophen-caffeine (EXCEDRIN MIGRAINE) 456-256-38 MG per tablet Take 2 tablets by mouth every 6 (six) hours as needed for headache.    . ibuprofen (ADVIL,MOTRIN) 600 MG tablet Take 1 tablet (600 mg total) by mouth every 8 (eight) hours as needed. (Patient taking differently: Take 600 mg by mouth every 8 (eight) hours as needed for moderate pain. ) 30 tablet 0  . MITIGARE 0.6 MG CAPS Take 1 tablet by mouth 2 (two) times daily as needed (gout). (Patient not taking: Reported on 03/21/2015) 30 capsule 1   No current facility-administered medications on file prior to visit.   Past Medical History  Diagnosis Date  . Arthritis   . 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 History of MRI of cervical spine 09/25/10    normal; left shoulder normal as well  . Gout   . Chronic knee pain   .  Chronic foot pain   . Hypertension   . Chronic elbow pain   . Chronic shoulder pain   . Chronic hip pain   . OSA on CPAP   . GERD (gastroesophageal reflux disease)   . Migraine     "about monthly" (03/03/2015)  . Chronic lower back pain       Review of Systems  Constitutional: Negative for fever.  Respiratory: Positive for shortness of breath.   Cardiovascular: Positive for chest pain,  palpitations and orthopnea. Negative for syncope, PND and near-syncope.  Gastrointestinal: Negative.  Negative for nausea and vomiting.  Neurological: Positive for dizziness. Negative for numbness and headaches.  All other systems reviewed and are negative.  Filed Vitals:   03/21/15 0941  BP: 126/74  Pulse: 52  Temp: 97.7 F (36.5 C)  TempSrc: Oral  Height: 6\' 2"  (1.88 m)  Weight: 329 lb 7 oz (149.432 kg)  SpO2: 97%       Objective:   Physical Exam  Constitutional: He is oriented to person, place, and time. He appears well-developed. No distress.  Cardiovascular: Regular rhythm and intact distal pulses.  Bradycardia present.  Exam reveals distant heart sounds.   No murmur heard. Distant heart sound due to body habitus.  Pulmonary/Chest: Effort normal and breath sounds normal. No respiratory distress. He has no wheezes. He has no rhonchi. He has no rales.    Abdominal: Soft. Bowel sounds are normal. He exhibits no mass. There is no tenderness.  Musculoskeletal: Normal range of motion. He exhibits no edema.  Neurological: He is alert and oriented to person, place, and time.  Nursing note and vitals reviewed.      Assessment:     Chest pain: Atypical Dizziness SOB     Plan:     Check problem list.

## 2015-03-21 NOTE — Assessment & Plan Note (Signed)
Chest pain reproducible. Likely atypical in nature. Orders placed or performed in visit on 03/21/15  . EKG 12-Lead  Result: Sinus brady at 49bpm, non-specific ST and T wave changes. No change from previous EKG. Chest pain and dizziness improved while in the office hence currently asymptomatic. Stress test done few weeks ago:         Defect 1: There is a large defect of moderate severity present in the basal inferior, basal inferolateral, mid inferior, mid inferolateral and apical inferior location.       The study is normal.       This is a low risk study.       The left ventricular ejection fraction is normal (55-65%).           Nuclear stress EF: 55%. Tylenol as needed for pain.  Continue Eliquis/anticoagulant for Afib as recommended. He has cards follow-up 1st week in Sept, he is advised to keep his appointment. I recommended ED visit if symptoms reoccurs or worsens. He agreed with plan.

## 2015-04-11 ENCOUNTER — Telehealth: Payer: Self-pay | Admitting: *Deleted

## 2015-04-11 NOTE — Telephone Encounter (Signed)
called for fm hx & status, pt N/A

## 2015-04-12 ENCOUNTER — Telehealth: Payer: Self-pay

## 2015-04-12 ENCOUNTER — Encounter: Payer: Self-pay | Admitting: Internal Medicine

## 2015-04-12 ENCOUNTER — Ambulatory Visit (INDEPENDENT_AMBULATORY_CARE_PROVIDER_SITE_OTHER): Payer: 59 | Admitting: Internal Medicine

## 2015-04-12 VITALS — BP 122/80 | HR 64 | Ht 74.0 in | Wt 329.0 lb

## 2015-04-12 DIAGNOSIS — G4733 Obstructive sleep apnea (adult) (pediatric): Secondary | ICD-10-CM

## 2015-04-12 DIAGNOSIS — I10 Essential (primary) hypertension: Secondary | ICD-10-CM

## 2015-04-12 DIAGNOSIS — I480 Paroxysmal atrial fibrillation: Secondary | ICD-10-CM

## 2015-04-12 MED ORDER — DRONEDARONE HCL 400 MG PO TABS: 400.0000 mg | ORAL_TABLET | Freq: Two times a day (BID) | ORAL | Status: DC

## 2015-04-12 NOTE — Progress Notes (Signed)
Electrophysiology Office Note   Date:  04/12/2015   ID:  [name], DOB [birth year], MRN [id number]  PCP:  Chrisandra Netters, MD  Cardiologist:  Dr Acie Fredrickson Primary Electrophysiologist: Thompson Grayer, MD    Chief Complaint  Patient presents with  . PAF     History of Present Illness: [name] is a 49 y.o. male who presents today for electrophysiology evaluation.   He reports initially being diagnosed with atrial fibrillation 7/15 after presenting with tachypalpitations.  In retrospect he has had symptoms of afib for about 5 years.  He would typically take 4-5 baby aspirins and it would resolve.  He was placed on eliquis and followed by Dr Acie Fredrickson.  He was noncompliant with follow-up.  He reports epsiodic afib about once per month, lasting a day or so at a time.  More recently, he developed abrupt onset of tachypalpitations with associated chest pain.  He presented to [facility] with afib.  His Afib had terminated by the time he arrived.  He was placed on metoprolol and had a myoview which revealed a fixed inferior defect which was felt to represent gut attenuation.   He had to decrease metoprolol to 12.5mg  daily due to bradycardia with associated dizziness and presyncope.  He continues to have some dizziness with this dose.  He has occasional 2 pillow orthopnea but states that he can walk on the eliptical for 35 minutes without trouble. He continues to try to make lifestyle change with diet modification and exercise.  Today, he denies symptoms of palpitations,  lower extremity edema, claudication, dizziness, presyncope, syncope, bleeding, or neurologic sequela. The patient is tolerating medications without difficulties and is otherwise without complaint today.    Past Medical History  Diagnosis Date  . Arthritis   . 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 History of MRI of cervical spine 09/25/10    normal; left shoulder  normal as well  . Gout   . Chronic knee pain   . Chronic foot pain   . Hypertension   . Chronic elbow pain   . Chronic shoulder pain   . Chronic hip pain   . OSA on CPAP   . GERD (gastroesophageal reflux disease)   . Migraine     "about monthly" (03/03/2015)  . Chronic lower back pain   . Morbid obesity   . Paroxysmal atrial fibrillation 7/15    chads2vasc score is 1   Past Surgical History  Procedure Laterality Date  . Knee arthroscopy Right 02/2013  . Cardiac catheterization  05/2005    "no blockage"     Current Outpatient Prescriptions  Medication Sig Dispense Refill  . aspirin-acetaminophen-caffeine (EXCEDRIN MIGRAINE) 250-250-65 MG per tablet Take 2 tablets by mouth every 6 (six) hours as needed for headache.    Marland Kitchen ELIQUIS 5 MG TABS tablet TAKE ONE TABLET BY MOUTH TWICE DAILY. 60 tablet 5  . ibuprofen (ADVIL,MOTRIN) 600 MG tablet Take 1 tablet (600 mg total) by mouth every 8 (eight) hours as needed. (Patient taking differently: Take 600 mg by mouth every 8 (eight) hours as needed for moderate pain. ) 30 tablet 0  . metoprolol tartrate (LOPRESSOR) 25 MG tablet Take 12.5 mg by mouth daily.    Marland Kitchen MITIGARE 0.6 MG CAPS Take 1 tablet by mouth 2 (two) times daily as needed (gout). 30 capsule 1  . Omega-3 Fatty Acids (FISH OIL) 1000 MG CAPS Take 2,000 mg by mouth  daily.     . omeprazole (PRILOSEC) 20 MG capsule TAKE ONE CAPSULE BY MOUTH TWICE DAILY 60 capsule 3   No current facility-administered medications for this visit.    Allergies:   Hydrochlorothiazide-propranolol and Penicillins   Social History:  The patient  reports that he quit smoking about 21 years ago. His smoking use included Cigarettes. He has a 10 pack-year smoking history. He has quit using smokeless tobacco. His smokeless tobacco use included Chew. He reports that he drinks alcohol. He reports that he uses illicit drugs (Marijuana).   Family History:  The patient's  family history includes Atrial fibrillation (age  of onset: 89) in his mother.    ROS:  Please see the history of present illness.   All other systems are reviewed and negative.    PHYSICAL EXAM: VS:  BP 122/80 mmHg  Pulse 64  Ht 6\' 2"  (1.88 m)  Wt 149.233 kg (329 lb)  BMI 42.22 kg/m2 , BMI Body mass index is 42.22 kg/(m^2). GEN: overweight, in no acute distress HEENT: normal Neck: no JVD, carotid bruits, or masses Cardiac: RRR; no murmurs, rubs, or gallops,no edema  Respiratory:  clear to auscultation bilaterally, normal work of breathing GI: soft, nontender, nondistended, + BS MS: no deformity or atrophy Skin: warm and dry  Neuro:  Strength and sensation are intact Psych: euthymic mood, full affect  EKG:  EKG is ordered today. The ekg ordered today shows sinus bradycardia 59 bpm, PR 214, Qtc 374 msec, repolarization abnormality   Recent Labs: 03/04/2015: BUN 10; Creatinine, Ser 0.93; Hemoglobin 13.7; Platelets 210; Potassium 4.2; Sodium 139    Lipid Panel     Component Value Date/Time   CHOL 173 03/04/2015 0615   TRIG 442* 03/04/2015 0615   HDL 25* 03/04/2015 0615   CHOLHDL 6.9 03/04/2015 0615   VLDL UNABLE TO CALCULATE IF TRIGLYCERIDE OVER 400 mg/dL 03/04/2015 0615   LDLCALC UNABLE TO CALCULATE IF TRIGLYCERIDE OVER 400 mg/dL 03/04/2015 0615     Wt Readings from Last 3 Encounters:  04/12/15 149.233 kg (329 lb)  03/21/15 149.432 kg (329 lb 7 oz)  03/05/15 149.324 kg (329 lb 3.2 oz)      Other studies Reviewed: Additional studies/ records that were reviewed today include: Hospital records, recent myoview, echo   Review of the above records today demonstrates: preserved EF, mild LA dilation   ASSESSMENT AND PLAN:  1.  Paroxysmal atrial fibrillation The patient has symptomatic recurrent paroxysmal atrial fibrillation.  Medical therapy is limited by bradycardia.  Given weight > 300 lbs, he is a poor candidate for ablation. Stop metprolol.  Start multaq 400mg  BID chads2vasc score is 1.  Continue eliquis If he  fails medical therapy with multaq, would next consider tikosyn.  2. HTN Stable No change required today  3. Obesity Body mass index is 42.22 kg/(m^2). I have reviewed the patients BMI and decreased success rates with ablation at length today.  Weight loss is strongly advised.  Per Guijian et al (PACE 2013; 36: 982-641), patients with BMI 25-29.9 (obese) have a 27% increase in AF recurrence post ablation.  Patients with BMI >30 have a 31% increase in AF recurrence post ablation when compared to those with BMI <25. Lifestyle modification is essential to our ability to maintain sinus long term  4. OSA Compliance with CPAP is encouraged  Follow-up with Roderic Palau NP in 4 weeks to assess response to multaq.  Follow-up with Dr Acie Fredrickson in 3 months I will see as needed going  forward  Current medicines are reviewed at length with the patient today.   The patient does not have concerns regarding his medicines.  The following changes were made today:  none  Signed, Thompson Grayer, MD  04/12/2015 11:04 AM     [facility] [address] [place] [address] [phone] (office) [phone] (fax)

## 2015-04-12 NOTE — Patient Instructions (Signed)
Medication Instructions:  Your physician has recommended you make the following change in your medication:  1) Stop Metoprolol 2) Start Multaq 400 mg twice daily with food   Labwork: None ordered  Testing/Procedures: None ordered  Follow-Up: Your physician recommends that you schedule a follow-up appointment in: 4 weeks with Roderic Palau, NP and 3 months with Dr Acie Fredrickson   Any Other Special Instructions Will Be Listed Below (If Applicable).

## 2015-04-13 NOTE — Telephone Encounter (Signed)
Prior auth for Multaq 400mg  obtained, good through 04/11/2016. [id number].

## 2015-05-01 ENCOUNTER — Ambulatory Visit (INDEPENDENT_AMBULATORY_CARE_PROVIDER_SITE_OTHER): Payer: 59 | Admitting: Family Medicine

## 2015-05-01 ENCOUNTER — Encounter: Payer: Self-pay | Admitting: Family Medicine

## 2015-05-01 VITALS — BP 159/91 | HR 60 | Temp 98.1°F | Wt 328.9 lb

## 2015-05-01 DIAGNOSIS — Z23 Encounter for immunization: Secondary | ICD-10-CM

## 2015-05-01 DIAGNOSIS — S29011A Strain of muscle and tendon of front wall of thorax, initial encounter: Secondary | ICD-10-CM | POA: Diagnosis not present

## 2015-05-01 MED ORDER — CYCLOBENZAPRINE HCL 5 MG PO TABS: 5.0000 mg | ORAL_TABLET | Freq: Three times a day (TID) | ORAL | Status: DC | PRN

## 2015-05-01 NOTE — Assessment & Plan Note (Signed)
Symptoms and exam consistent with muscle strain of chest wall and right rib cage Flexeril 5 mg 3 times a day when necessary for short course Advised patient not to take Flexeril when operating heavy machinery or driving Advised about rest if possible Return precautions given

## 2015-05-01 NOTE — Patient Instructions (Signed)
Nice to meet you today. I think you strained a muscle on your chest wall. You can take Flexeril as needed. Do not take it while operating heavy machinery or driving. I would recommend just taking it at bedtime.  Take care,  Dr. Jacinto Reap  Muscle Strain A muscle strain is an injury that occurs when a muscle is stretched beyond its normal length. Usually a small number of muscle fibers are torn when this happens. Muscle strain is rated in degrees. First-degree strains have the least amount of muscle fiber tearing and pain. Second-degree and third-degree strains have increasingly more tearing and pain.  Usually, recovery from muscle strain takes 1-2 weeks. Complete healing takes 5-6 weeks.  CAUSES  Muscle strain happens when a sudden, violent force placed on a muscle stretches it too far. This may occur with lifting, sports, or a fall.  RISK FACTORS Muscle strain is especially common in athletes.  SIGNS AND SYMPTOMS At the site of the muscle strain, there may be:  Pain.  Bruising.  Swelling.  Difficulty using the muscle due to pain or lack of normal function. DIAGNOSIS  Your health care [name] will perform a physical exam and ask about your medical history. TREATMENT  Often, the best treatment for a muscle strain is resting, icing, and applying cold compresses to the injured area.  HOME CARE INSTRUCTIONS   Use the PRICE method of treatment to promote muscle healing during the first 2-3 days after your injury. The PRICE method involves:  Protecting the muscle from being injured again.  Restricting your activity and resting the injured body part.  Icing your injury. To do this, put ice in a plastic bag. Place a towel between your skin and the bag. Then, apply the ice and leave it on from 15-20 minutes each hour. After the third day, switch to moist heat packs.  Apply compression to the injured area with a splint or elastic bandage. Be careful not to wrap it too tightly. This may interfere  with blood circulation or increase swelling.  Elevate the injured body part above the level of your heart as often as you can.  Only take over-the-counter or prescription medicines for pain, discomfort, or fever as directed by your health care [name].  Warming up prior to exercise helps to prevent future muscle strains. SEEK MEDICAL CARE IF:   You have increasing pain or swelling in the injured area.  You have numbness, tingling, or a significant loss of strength in the injured area. MAKE SURE YOU:   Understand these instructions.  Will watch your condition.  Will get help right away if you are not doing well or get worse. Document Released: 07/22/2005 Document Revised: 05/12/2013 Document Reviewed: 02/18/2013 [facility] Patient Information 2015 [place], [place]. This information is not intended to replace advice given to you by your health care [name]. Make sure you discuss any questions you have with your health care [name].

## 2015-05-01 NOTE — Progress Notes (Signed)
   Subjective:   [name] is a 49 y.o. male with a history of HTN, a fib, OSA on CPAP, HLD, GERD here for Same day appt for chest pain.  Patient reports intermittent sharp pains across lower chest and over ribs on the right side for the last 4 days. He thinks is related to pulling and lifting heavy things more than usual. Ear reports a hurts more when bending over and standing back up. He still lifting things at last few days and it hurts worse nice doing that. Pain across his chest is been getting better but the pain on his right side has worsened. He tried ibuprofen over the weekend but it didn't seem to help much. Rest is really improved his pain. He denies any shortness of breath and reports it sometimes hurts to take a deep breath. No lower extremity edema.  Review of Systems: Per HPI.  Social History: former smoker  Objective:  BP 159/91 mmHg  Pulse 60  Temp(Src) 98.1 F (36.7 C) (Oral)  Wt 328 lb 14.4 oz (149.188 kg)  Gen:  49 y.o. male in NAD HEENT: NCAT, MMM, EOMI, PERRL, anicteric sclerae CV: RRR, no MRG Resp: Non-labored, CTAB, no wheezes noted Ext: WWP, no edema MSK: TTP over musculature of R flank.  Pain with ROM. Neuro: Alert and oriented, speech normal    Assessment & Plan:     [name] is a 49 y.o. male here for Muscle strain.  Muscle strain of chest wall Symptoms and exam consistent with muscle strain of chest wall and right rib cage Flexeril 5 mg 3 times a day when necessary for short course Advised patient not to take Flexeril when operating heavy machinery or driving Advised about rest if possible Return precautions given   Virginia Crews, MD MPH PGY-2,  [facility] 05/01/2015  12:31 PM

## 2015-05-08 ENCOUNTER — Encounter (HOSPITAL_COMMUNITY): Payer: Self-pay | Admitting: Nurse Practitioner

## 2015-05-08 ENCOUNTER — Ambulatory Visit (HOSPITAL_COMMUNITY)
Admission: RE | Admit: 2015-05-08 | Discharge: 2015-05-08 | Disposition: A | Discharge status: Home / Self Care | Payer: 59 | Source: Ambulatory Visit | Attending: Nurse Practitioner | Admitting: Nurse Practitioner

## 2015-05-08 VITALS — BP 122/88 | HR 58 | Ht 74.0 in | Wt 327.2 lb

## 2015-05-08 DIAGNOSIS — Z8249 Family history of ischemic heart disease and other diseases of the circulatory system: Secondary | ICD-10-CM | POA: Diagnosis not present

## 2015-05-08 DIAGNOSIS — Z880 Allergy status to penicillin: Secondary | ICD-10-CM | POA: Insufficient documentation

## 2015-05-08 DIAGNOSIS — Z6841 Body Mass Index (BMI) 40.0 and over, adult: Secondary | ICD-10-CM | POA: Diagnosis not present

## 2015-05-08 DIAGNOSIS — G4733 Obstructive sleep apnea (adult) (pediatric): Secondary | ICD-10-CM | POA: Diagnosis not present

## 2015-05-08 DIAGNOSIS — I480 Paroxysmal atrial fibrillation: Secondary | ICD-10-CM | POA: Diagnosis not present

## 2015-05-08 DIAGNOSIS — Z79899 Other long term (current) drug therapy: Secondary | ICD-10-CM | POA: Insufficient documentation

## 2015-05-08 DIAGNOSIS — I4891 Unspecified atrial fibrillation: Secondary | ICD-10-CM | POA: Diagnosis not present

## 2015-05-08 DIAGNOSIS — I10 Essential (primary) hypertension: Secondary | ICD-10-CM | POA: Diagnosis not present

## 2015-05-08 DIAGNOSIS — Z7902 Long term (current) use of antithrombotics/antiplatelets: Secondary | ICD-10-CM | POA: Insufficient documentation

## 2015-05-08 DIAGNOSIS — K219 Gastro-esophageal reflux disease without esophagitis: Secondary | ICD-10-CM | POA: Diagnosis not present

## 2015-05-08 DIAGNOSIS — Z87891 Personal history of nicotine dependence: Secondary | ICD-10-CM | POA: Insufficient documentation

## 2015-05-08 NOTE — Patient Instructions (Signed)
May take 1/2 tablet extra of metoprolol if afib heart rate near 100 as long as BP above 100

## 2015-05-08 NOTE — Progress Notes (Signed)
Patient ID: [name], male   DOB: [birth year], 49 y.o.   MRN: [id number]     Primary Care Physician: Chrisandra Netters, MD Referring Physician: Dr. Naomie Dean is a 49 y.o. male with a h/o atrial fibrillation 7/15 after presenting with tachypalpitations. In retrospect he has had symptoms of afib for about 5 years. He would typically take 4-5 baby aspirins and it would resolve. He was placed on eliquis and followed by Dr Acie Fredrickson. He was noncompliant with follow-up. He reports epsiodic afib about once per month, lasting a day or so at a time. More recently, he developed abrupt onset of tachypalpitations with associated chest pain. He presented to [facility] with afib. His Afib had terminated by the time he arrived. He was placed on metoprolol and had a myoview which revealed a fixed inferior defect which was felt to represent gut attenuation.  He had to decrease metoprolol to 12.5mg  daily due to bradycardia with associated dizziness and presyncope. He continued to have some dizziness with this dose.  He has occasional 2 pillow orthopnea but states that he can walk on the eliptical for 35 minutes without trouble. He continues to try to make lifestyle change with diet modification and exercise.  He was placed on multaq by Dr. Rayann Heman when he was seen 9/7. He reports that his afib burden has reduced. He feels well on this med. Metoprolol was stopped due to bradycardia. He is using cpap and has lost about 10 lbs by his report.   Today, he denies symptoms of palpitations, chest pain, shortness of breath, orthopnea, PND, lower extremity edema, dizziness, presyncope, syncope, or neurologic sequela. The patient is tolerating medications without difficulties and is otherwise without complaint today.   Past Medical History  Diagnosis Date  . Arthritis   . 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 History of MRI of cervical  spine 09/25/10    normal; left shoulder normal as well  . Gout   . Chronic knee pain   . Chronic foot pain   . Hypertension   . Chronic elbow pain   . Chronic shoulder pain   . Chronic hip pain   . OSA on CPAP   . GERD (gastroesophageal reflux disease)   . Migraine     "about monthly" (03/03/2015)  . Chronic lower back pain   . Morbid obesity ([place])   . Paroxysmal atrial fibrillation ([place]) 7/15    chads2vasc score is 1   Past Surgical History  Procedure Laterality Date  . Knee arthroscopy Right 02/2013  . Cardiac catheterization  05/2005    "no blockage"    Current Outpatient Prescriptions  Medication Sig Dispense Refill  . aspirin-acetaminophen-caffeine (EXCEDRIN MIGRAINE) 250-250-65 MG per tablet Take 2 tablets by mouth every 6 (six) hours as needed for headache.    . cyclobenzaprine (FLEXERIL) 5 MG tablet Take 1 tablet (5 mg total) by mouth 3 (three) times daily as needed for muscle spasms. 30 tablet 0  . dronedarone (MULTAQ) 400 MG tablet Take 1 tablet (400 mg total) by mouth 2 (two) times daily with a meal. 60 tablet 11  . ELIQUIS 5 MG TABS tablet TAKE ONE TABLET BY MOUTH TWICE DAILY. 60 tablet 5  . ibuprofen (ADVIL,MOTRIN) 600 MG tablet Take 1 tablet (600 mg total) by mouth every 8 (eight) hours as needed. (Patient taking differently: Take 600 mg by mouth every 8 (eight) hours as needed for  moderate pain. ) 30 tablet 0  . MITIGARE 0.6 MG CAPS Take 1 tablet by mouth 2 (two) times daily as needed (gout). 30 capsule 1  . Omega-3 Fatty Acids (FISH OIL) 1000 MG CAPS Take 2,000 mg by mouth daily.     Marland Kitchen omeprazole (PRILOSEC) 20 MG capsule TAKE ONE CAPSULE BY MOUTH TWICE DAILY 60 capsule 3   No current facility-administered medications for this encounter.    Allergies  Allergen Reactions  . Hydrochlorothiazide-Propranolol [Propranolol-Hctz] Other (See Comments)    Joints lock up  . Penicillins Other (See Comments)    Makes joints stiff and unable to move    Social History    Social History  . Marital Status: Married    Spouse Name: N/A  . Number of Children: N/A  . Years of Education: N/A   Occupational History  . Not on file.   Social History Main Topics  . Smoking status: Former Smoker -- 1.00 packs/day for 10 years    Types: Cigarettes    Quit date: 04/02/1994  . Smokeless tobacco: Former Systems developer    Types: Chew     Comment: "chewed in my teens"  . Alcohol Use: 0.0 oz/week    0 Standard drinks or equivalent per week     Comment: "stopped in 1994"  . Drug Use: Yes    Special: Marijuana     Comment: "stopped marijuna in 1994"  . Sexual Activity: Yes   Other Topics Concern  . Not on file   Social History Narrative   Lives in [place] with spouse.  3 children, all grown.   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 Atrial fibrillation Mother 62    had a stroke    ROS- All systems are reviewed and negative except as per the HPI above  Physical Exam: Filed Vitals:   05/08/15 0919  BP: 122/88  Pulse: 58  Height: 6\' 2"  (1.88 m)  Weight: 327 lb 3.2 oz (148.417 kg)    GEN- The patient is well appearing, alert and oriented x 3 today.   Head- normocephalic, atraumatic Eyes-  Sclera clear, conjunctiva pink Ears- hearing intact Oropharynx- clear Neck- supple, no JVP Lymph- no cervical lymphadenopathy Lungs- Clear to ausculation bilaterally, normal work of breathing Heart- Regular rate and rhythm, no murmurs, rubs or gallops, PMI not laterally displaced GI- soft, NT, ND, + BS Extremities- no clubbing, cyanosis, or edema MS- no significant deformity or atrophy Skin- no rash or lesion Psych- euthymic mood, full affect Neuro- strength and sensation are intact  EKG- SR at 58 bpm, pr int 208 ms, qrs int 92 ms, qtc 400 ms. Epic records reviewed     Assessment and Plan: 1. Afib  In SR on multaq Continue eliquis May take 12.5 mg metoprolol if breakthrough  afib if Sys BP/HR over 100.  2. Obesity Continue weight loss efforts/ try to establish regular exercise  F/u with Dr. Cathie Olden 07/10/15 as scheduled Afib clinic as needed  Butch Penny C. Derl Abalos, [facility] [address] [place], [place] [address] [phone]

## 2015-05-22 ENCOUNTER — Encounter: Payer: Self-pay | Admitting: Family Medicine

## 2015-05-22 ENCOUNTER — Ambulatory Visit (INDEPENDENT_AMBULATORY_CARE_PROVIDER_SITE_OTHER): Payer: 59 | Admitting: Family Medicine

## 2015-05-22 VITALS — BP 138/86 | HR 60 | Temp 97.9°F | Ht 74.0 in | Wt 322.7 lb

## 2015-05-22 DIAGNOSIS — I10 Essential (primary) hypertension: Secondary | ICD-10-CM

## 2015-05-22 DIAGNOSIS — R42 Dizziness and giddiness: Secondary | ICD-10-CM

## 2015-05-22 DIAGNOSIS — Z0000 Encounter for general adult medical examination without abnormal findings: Secondary | ICD-10-CM | POA: Diagnosis not present

## 2015-05-22 DIAGNOSIS — E785 Hyperlipidemia, unspecified: Secondary | ICD-10-CM | POA: Diagnosis not present

## 2015-05-22 DIAGNOSIS — Z131 Encounter for screening for diabetes mellitus: Secondary | ICD-10-CM

## 2015-05-22 DIAGNOSIS — Z7901 Long term (current) use of anticoagulants: Secondary | ICD-10-CM | POA: Diagnosis not present

## 2015-05-22 DIAGNOSIS — I480 Paroxysmal atrial fibrillation: Secondary | ICD-10-CM

## 2015-05-22 NOTE — Progress Notes (Signed)
Patient ID: [name], male   DOB: [birth year], 49 y.o.   MRN: [id number] Date of Visit: 05/22/2015   HPI:  Patient presents today for a well adult male exam.   Concerns today: see below Sexual activity: one male partner (wife), no condoms STD Screening: declines Exercise: gym 3-4 days per week Smoking: none Alcohol: none Drugs: none  ROS sheet positive for headaches, weakness, numbness, dizziness. Clarified with patient. He endorses feeling lightheaded about 30 minutes after he eats each meal. Has not actually passed out, but feels like he might. Denies fevers, vomiting, diarrhea. Has gone on for about 6 months. 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 Has never had colonoscopy. No blood in his stool. The feeling goes away after he eats a protein bar.  Also notes intermittent ringing in ears, improved recently.  ROS: See HPI  [place]:  Cancers in family: two uncles with cancer but unsure of what type  PHYSICAL EXAM: BP 138/86 mmHg  Pulse 60  Temp(Src) 97.9 F (36.6 C) (Oral)  Ht 6\' 2"  (1.88 m)  Wt 322 lb 11.2 oz (146.376 kg)  BMI 41.41 kg/m2 Gen: NAD, pleasant, cooperative HEENT: NCAT, PERRL, no palpable thyromegaly or anterior cervical lymphadenopathy. moist mucous membranes. Poor dentition. tympanic membranes clear bilaterally Heart: RRR, no murmurs Lungs: CTAB, NWOB Abdomen: soft, nontender to palpation Neuro: grossly nonfocal, speech normal Extremities: minimal edema bilateral lower ext  ASSESSMENT/PLAN:  Health maintenance:  -STD screening: declines gc testing but agreeable to standard HIV & hep C screens -immunizations: UTD on flu vaccine -lipid screening: known HLD, return for fasting labs -colonoscopy: given handout on how to schedule -handout given on health maintenance topics  Atrial fibrillation ([place]) Check CBC with labs to ensure stability of Hgb on  eliquis  Dizziness Patient now endorsing lightheadedness only after eating, improved with eating protein bar. Sounds vagally mediated, possibly due to act of swallowing. Encouraged patient to try eating protein bar with his food to see if can avoid lightheadedness. Follow up if not improving with this technique.  HYPERTENSION, BENIGN SYSTEMIC Well controlled. Continue current regimen.   Hyperlipidemia Return for fasting CMET & lipids  Tinnitus - improved recently. Follow up if persistent or recurring  FOLLOW UP: F/u in 3 months for chronic medical problems  [name], [place]

## 2015-05-22 NOTE — Patient Instructions (Signed)
On your way out, schedule an appointment one morning to come back for fasting labs. Do not eat or drink anything other than water the morning of your lab appointment until after your labs are drawn.   See the handout on how to schedule your colonoscopy. This is an important screening test for colon cancer.   Try eating protein bar with meals to see if you feel better  Follow up with me in 3 months, sooner if needed  Be well, Dr. Ardelia Mems  Health Maintenance, Male A healthy lifestyle and preventative care can promote health and wellness.  Maintain regular health, dental, and eye exams.  Eat a healthy diet. Foods like vegetables, fruits, whole grains, low-fat dairy products, and lean protein foods contain the nutrients you need and are low in calories. Decrease your intake of foods high in solid fats, added sugars, and salt. Get information about a proper diet from your health care provider, if necessary.  Regular physical exercise is one of the most important things you can do for your health. Most adults should get at least 150 minutes of moderate-intensity exercise (any activity that increases your heart rate and causes you to sweat) each week. In addition, most adults need muscle-strengthening exercises on 2 or more days a week.   Maintain a healthy weight. The body mass index (BMI) is a screening tool to identify possible weight problems. It provides an estimate of body fat based on height and weight. Your health care provider can find your BMI and can help you achieve or maintain a healthy weight. For males 20 years and older:  A BMI below 18.5 is considered underweight.  A BMI of 18.5 to 24.9 is normal.  A BMI of 25 to 29.9 is considered overweight.  A BMI of 30 and above is considered obese.  Maintain normal blood lipids and cholesterol by exercising and minimizing your intake of saturated fat. Eat a balanced diet with plenty of fruits and vegetables. Blood tests for lipids and  cholesterol should begin at age 71 and be repeated every 5 years. If your lipid or cholesterol levels are high, you are over age 16, or you are at high risk for heart disease, you may need your cholesterol levels checked more frequently.Ongoing high lipid and cholesterol levels should be treated with medicines if diet and exercise are not working.  If you smoke, find out from your health care provider how to quit. If you do not use tobacco, do not start.  Lung cancer screening is recommended for adults aged 68-80 years who are at high risk for developing lung cancer because of a history of smoking. A yearly low-dose CT scan of the lungs is recommended for people who have at least a 30-pack-year history of smoking and are current smokers or have quit within the past 15 years. A pack year of smoking is smoking an average of 1 pack of cigarettes a day for 1 year (for example, a 30-pack-year history of smoking could mean smoking 1 pack a day for 30 years or 2 packs a day for 15 years). Yearly screening should continue until the smoker has stopped smoking for at least 15 years. Yearly screening should be stopped for people who develop a health problem that would prevent them from having lung cancer treatment.  If you choose to drink alcohol, do not have more than 2 drinks per day. One drink is considered to be 12 oz (360 mL) of beer, 5 oz (150 mL) of wine,  or 1.5 oz (45 mL) of liquor.  Avoid the use of street drugs. Do not share needles with anyone. Ask for help if you need support or instructions about stopping the use of drugs.  High blood pressure causes heart disease and increases the risk of stroke. High blood pressure is more likely to develop in:  People who have blood pressure in the end of the normal range (100-139/85-89 mm Hg).  People who are overweight or obese.  People who are African American.  If you are 33-54 years of age, have your blood pressure checked every 3-5 years. If you are 36  years of age or older, have your blood pressure checked every year. You should have your blood pressure measured twice--once when you are at a hospital or clinic, and once when you are not at a hospital or clinic. Record the average of the two measurements. To check your blood pressure when you are not at a hospital or clinic, you can use:  An automated blood pressure machine at a pharmacy.  A home blood pressure monitor.  If you are 75-16 years old, ask your health care provider if you should take aspirin to prevent heart disease.  Diabetes screening involves taking a blood sample to check your fasting blood sugar level. This should be done once every 3 years after age 21 if you are at a normal weight and without risk factors for diabetes. Testing should be considered at a younger age or be carried out more frequently if you are overweight and have at least 1 risk factor for diabetes.  Colorectal cancer can be detected and often prevented. Most routine colorectal cancer screening begins at the age of 47 and continues through age 60. However, your health care provider may recommend screening at an earlier age if you have risk factors for colon cancer. On a yearly basis, your health care provider may provide home test kits to check for hidden blood in the stool. A small camera at the end of a tube may be used to directly examine the colon (sigmoidoscopy or colonoscopy) to detect the earliest forms of colorectal cancer. Talk to your health care provider about this at age 75 when routine screening begins. A direct exam of the colon should be repeated every 5-10 years through age 73, unless early forms of precancerous polyps or small growths are found.  People who are at an increased risk for hepatitis B should be screened for this virus. You are considered at high risk for hepatitis B if:  You were born in a country where hepatitis B occurs often. Talk with your health care provider about which countries  are considered high risk.  Your parents were born in a high-risk country and you have not received a shot to protect against hepatitis B (hepatitis B vaccine).  You have HIV or AIDS.  You use needles to inject street drugs.  You live with, or have sex with, someone who has hepatitis B.  You are a man who has sex with other men (MSM).  You get hemodialysis treatment.  You take certain medicines for conditions like cancer, organ transplantation, and autoimmune conditions.  Hepatitis C blood testing is recommended for all people born from [birth year] through 1967 and any individual with known risk factors for hepatitis C.  Healthy men should no longer receive prostate-specific antigen (PSA) blood tests as part of routine cancer screening. Talk to your health care provider about prostate cancer screening.  Testicular cancer screening is  not recommended for adolescents or adult males who have no symptoms. Screening includes self-exam, a health care provider exam, and other screening tests. Consult with your health care provider about any symptoms you have or any concerns you have about testicular cancer.  Practice safe sex. Use condoms and avoid high-risk sexual practices to reduce the spread of sexually transmitted infections (STIs).  You should be screened for STIs, including gonorrhea and chlamydia if:  You are sexually active and are younger than 24 years.  You are older than 24 years, and your health care provider tells you that you are at risk for this type of infection.  Your sexual activity has changed since you were last screened, and you are at an increased risk for chlamydia or gonorrhea. Ask your health care provider if you are at risk.  If you are at risk of being infected with HIV, it is recommended that you take a prescription medicine daily to prevent HIV infection. This is called pre-exposure prophylaxis (PrEP). You are considered at risk if:  You are a man who has sex with  other men (MSM).  You are a heterosexual man who is sexually active with multiple partners.  You take drugs by injection.  You are sexually active with a partner who has HIV.  Talk with your health care provider about whether you are at high risk of being infected with HIV. If you choose to begin PrEP, you should first be tested for HIV. You should then be tested every 3 months for as long as you are taking PrEP.  Use sunscreen. Apply sunscreen liberally and repeatedly throughout the day. You should seek shade when your shadow is shorter than you. Protect yourself by wearing long sleeves, pants, a wide-brimmed hat, and sunglasses year round whenever you are outdoors.  Tell your health care provider of new moles or changes in moles, especially if there is a change in shape or color. Also, tell your health care provider if a mole is larger than the size of a pencil eraser.  A one-time screening for abdominal aortic aneurysm (AAA) and surgical repair of large AAAs by ultrasound is recommended for men aged 96-75 years who are current or former smokers.  Stay current with your vaccines (immunizations).   This information is not intended to replace advice given to you by your health care provider. Make sure you discuss any questions you have with your health care provider.   Document Released: 01/18/2008 Document Revised: 08/12/2014 Document Reviewed: 12/17/2010 Elsevier Interactive Patient Education [employer].

## 2015-05-25 NOTE — Assessment & Plan Note (Signed)
Return for fasting CMET & lipids

## 2015-05-25 NOTE — Assessment & Plan Note (Signed)
Well-controlled.  Continue current regimen. 

## 2015-05-25 NOTE — Assessment & Plan Note (Signed)
Patient now endorsing lightheadedness only after eating, improved with eating protein bar. Sounds vagally mediated, possibly due to act of swallowing. Encouraged patient to try eating protein bar with his food to see if can avoid lightheadedness. Follow up if not improving with this technique.

## 2015-05-25 NOTE — Assessment & Plan Note (Signed)
Check CBC with labs to ensure stability of Hgb on eliquis

## 2015-05-26 ENCOUNTER — Encounter: Payer: Self-pay | Admitting: Obstetrics and Gynecology

## 2015-05-26 ENCOUNTER — Ambulatory Visit (INDEPENDENT_AMBULATORY_CARE_PROVIDER_SITE_OTHER): Payer: 59 | Admitting: Obstetrics and Gynecology

## 2015-05-26 VITALS — BP 139/86 | HR 59 | Temp 97.9°F | Ht 74.0 in | Wt 324.1 lb

## 2015-05-26 DIAGNOSIS — M25561 Pain in right knee: Secondary | ICD-10-CM | POA: Diagnosis not present

## 2015-05-26 MED ORDER — CYCLOBENZAPRINE HCL 5 MG PO TABS: 5.0000 mg | ORAL_TABLET | Freq: Three times a day (TID) | ORAL | Status: DC | PRN

## 2015-05-26 MED ORDER — IBUPROFEN 600 MG PO TABS: 600.0000 mg | ORAL_TABLET | Freq: Three times a day (TID) | ORAL | Status: DC | PRN

## 2015-05-26 MED ORDER — SLIP-ON KNEE BRACE MISC: Status: DC

## 2015-05-26 NOTE — Patient Instructions (Signed)
Medicine prescribed for knee pain sent to pharmacy Watch for signs of bleeding Knee brace also ordered  Knee Pain Knee pain is a common problem. It can have many causes. The pain often goes away by following your doctor's home care instructions. Treatment for ongoing pain will depend on the cause of your pain. If your knee pain continues, more tests may be needed to diagnose your condition. Tests may include X-rays or other imaging studies of your knee. HOME CARE  Take medicines only as told by your doctor.  Rest your knee and keep it raised (elevated) while you are resting.  Do not do things that cause pain or make your pain worse.  Avoid activities where both feet leave the ground at the same time, such as running, jumping rope, or doing jumping jacks.  Apply ice to the knee area:  Put ice in a plastic bag.  Place a towel between your skin and the bag.  Leave the ice on for 20 minutes, 2-3 times a day.  Ask your doctor if you should wear an elastic knee support.  Sleep with a pillow under your knee.  Lose weight if you are overweight. Being overweight can make your knee hurt more.  Do not use any tobacco products, including cigarettes, chewing tobacco, or electronic cigarettes. If you need help quitting, ask your doctor. Smoking may slow the healing of any bone and joint problems that you may have. GET HELP IF:  Your knee pain does not stop, it changes, or it gets worse.  You have a fever along with knee pain.  Your knee gives out or locks up.  Your knee becomes more swollen. GET HELP RIGHT AWAY IF:   Your knee feels hot to the touch.  You have chest pain or trouble breathing.   This information is not intended to replace advice given to you by your health care provider. Make sure you discuss any questions you have with your health care provider.   Document Released: 10/18/2008 Document Revised: 08/12/2014 Document Reviewed: 09/22/2013 Elsevier Interactive Patient  Education [employer].

## 2015-05-26 NOTE — Progress Notes (Signed)
   Subjective:   Patient ID: [name], male    DOB: 06/19/1966, 49 y.o.   MRN: [id number]  Patient presents for Same Day Appointment  Chief Complaint  Patient presents with  . Knee Pain    right knee x 1 week started getting worse yesterday with little swelling    HPI: # EXTREMITY PAIN: Location: left knee Pain started: one week ago but acutely worse on Wednesday Worse with bending knee Pain goes around whole knee and around back of lower leg Severity: 8/10 Medications tried: none Recent trauma: no Similar pain previously: no H/o gout but never in knee Has had knee surgery to right knee for meniscal tear in past  Symptoms Redness:no Swelling: some Fever: no Weakness: yes Rash: no  Review of Systems   See HPI for ROS.   Past medical history, surgical, family, and social history reviewed and updated in the EMR as appropriate.  Objective:  BP 139/86 mmHg  Pulse 59  Temp(Src) 97.9 F (36.6 C) (Oral)  Ht 6\' 2"  (1.88 m)  Wt 324 lb 1 oz (146.994 kg)  BMI 41.59 kg/m2 Vitals and nursing note reviewed  Physical Exam  Constitutional: He is well-developed, well-nourished, and in no distress.  Musculoskeletal:       Right knee: He exhibits normal range of motion, no swelling, no deformity and no erythema.  Ligaments intact. No signs of meniscal injury.  Neurological: He has normal sensation and normal strength. Gait normal.    Assessment & Plan:  1. Right knee pain: Most likely etiology of knee pain is OA. Other differentials include knee sprain but this is unlikely given that patient has not had any injuries. Tear/ligamentous injury is again unlikely due to normal physical exam and no mechanism of injury. DVT also on differential but less likely as patient does not have classic findings including swelling, warmth, erythema, etc. Homan's was negative. Vitals are stable. Discussed treatments for OA including supportive treatment with ice, NSAID, rest versus injections.  Patient wants to do conservative treatment. -knee brace ordered to help when at work with the bending -NSAID prescribed; patient told about risk and benefits including bleeding. Accepts risk and states he has used before without bleeding -return precautions given -would consider knee imaging to help with diagnosis if not improved on follow-up -handout given   Meds ordered this encounter  Medications  . Elastic Bandages & Supports (SLIP-ON KNEE BRACE) MISC    Sig: Apply knee brace to knee with activity.    Dispense:  1 each    Refill:  0  . ibuprofen (ADVIL,MOTRIN) 600 MG tablet    Sig: Take 1 tablet (600 mg total) by mouth every 8 (eight) hours as needed for moderate pain.    Dispense:  30 tablet    Refill:  0  . cyclobenzaprine (FLEXERIL) 5 MG tablet    Sig: Take 1 tablet (5 mg total) by mouth 3 (three) times daily as needed for muscle spasms.    Dispense:  30 tablet    Refill:  0    Luiz Blare, DO 05/26/2015, 4:11 PM PGY-2, [place]

## 2015-06-09 ENCOUNTER — Emergency Department (HOSPITAL_COMMUNITY)
Admission: EM | Admit: 2015-06-09 | Discharge: 2015-06-10 | Disposition: A | Discharge status: Home / Self Care | Payer: 59 | Attending: Emergency Medicine | Admitting: Emergency Medicine

## 2015-06-09 ENCOUNTER — Encounter (HOSPITAL_COMMUNITY): Payer: Self-pay

## 2015-06-09 DIAGNOSIS — I10 Essential (primary) hypertension: Secondary | ICD-10-CM | POA: Diagnosis not present

## 2015-06-09 DIAGNOSIS — G8929 Other chronic pain: Secondary | ICD-10-CM | POA: Diagnosis not present

## 2015-06-09 DIAGNOSIS — Z880 Allergy status to penicillin: Secondary | ICD-10-CM | POA: Diagnosis not present

## 2015-06-09 DIAGNOSIS — Z9981 Dependence on supplemental oxygen: Secondary | ICD-10-CM | POA: Insufficient documentation

## 2015-06-09 DIAGNOSIS — Z7982 Long term (current) use of aspirin: Secondary | ICD-10-CM | POA: Diagnosis not present

## 2015-06-09 DIAGNOSIS — I480 Paroxysmal atrial fibrillation: Secondary | ICD-10-CM | POA: Insufficient documentation

## 2015-06-09 DIAGNOSIS — M1990 Unspecified osteoarthritis, unspecified site: Secondary | ICD-10-CM | POA: Insufficient documentation

## 2015-06-09 DIAGNOSIS — G4733 Obstructive sleep apnea (adult) (pediatric): Secondary | ICD-10-CM | POA: Diagnosis not present

## 2015-06-09 DIAGNOSIS — Z79899 Other long term (current) drug therapy: Secondary | ICD-10-CM | POA: Insufficient documentation

## 2015-06-09 DIAGNOSIS — R079 Chest pain, unspecified: Secondary | ICD-10-CM | POA: Diagnosis present

## 2015-06-09 NOTE — ED Provider Notes (Signed)
CSN: [id number]     Arrival date & time 06/09/15  2337 History  By signing my name below, I, Meriel Pica, attest that this documentation has been prepared under the direction and in the presence of Everlene Balls, MD. Electronically Signed: Meriel Pica, ED Scribe. 06/09/2015. 11:58 PM.   Chief Complaint  Patient presents with  . Chest Pain   The history is provided by the patient. No language interpreter was used.   HPI Comments: [name] is a 49 y.o. male, with a PMhx of HTN, OSA on CPAP, and paroxysmal Afib on Eliquis therapy, who presents to the Emergency Department complaining of palpitations onset 1.5 hours ago with associated nausea and SOB. Pt states his 'heart is out of rhythm' which he reports is typical of his recurrent PAF. At baseline the pt states he 'goes in and out of Afib' with the episodes of Afib occurring 1 time per month and lasting for 2 days at a time. He was prompted to the ED this evening due to onset of palpitations and recently running out of Eliquis; last dose 4 days ago. The pt was taken off metoprolol approximately 2 months ago and started on multaq 400mg  BID. He denies missing any doses of the multaq and has this medication at home. Also denies any CP, vomiting, diarrhea, recent illness or diaphoresis.  Cardiologist: Thompson Grayer, MD  Past Medical History  Diagnosis Date  . Arthritis   . 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 History of MRI of cervical spine 09/25/10    normal; left shoulder normal as well  . Gout   . Chronic knee pain   . Chronic foot pain   . Hypertension   . Chronic elbow pain   . Chronic shoulder pain   . Chronic hip pain   . OSA on CPAP   . GERD (gastroesophageal reflux disease)   . Migraine     "about monthly" (03/03/2015)  . Chronic lower back pain   . Morbid obesity ([place])   . Paroxysmal atrial fibrillation ([place]) 7/15    chads2vasc score is 1   Past  Surgical History  Procedure Laterality Date  . Knee arthroscopy Right 02/2013  . Cardiac catheterization  05/2005    "no blockage"   Family History  Problem Relation Age of Onset  . Atrial fibrillation Mother 93    had a stroke   Social History  Substance Use Topics  . Smoking status: Former Smoker -- 1.00 packs/day for 10 years    Types: Cigarettes    Quit date: 04/02/1994  . Smokeless tobacco: Former Systems developer    Types: Chew     Comment: "chewed in my teens"  . Alcohol Use: 0.0 oz/week    0 Standard drinks or equivalent per week     Comment: "stopped in 1994"    Review of Systems  Constitutional: Negative for fever and diaphoresis.  Respiratory: Positive for shortness of breath.   Cardiovascular: Positive for palpitations. Negative for chest pain.  Gastrointestinal: Positive for nausea. Negative for vomiting and diarrhea.  A complete 10 system review of systems was obtained and is otherwise negative except at noted in the HPI and PMH.  Allergies  Hydrochlorothiazide-propranolol and Penicillins  Home Medications   Prior to Admission medications   Medication Sig Start Date End Date Taking? Authorizing Provider  aspirin-acetaminophen-caffeine (EXCEDRIN MIGRAINE) [phone] MG per tablet Take 2 tablets by mouth every 6 (six) hours  as needed for headache.    Historical Provider, MD  cyclobenzaprine (FLEXERIL) 5 MG tablet Take 1 tablet (5 mg total) by mouth 3 (three) times daily as needed for muscle spasms. 05/26/15   Katheren Shams, DO  dronedarone (MULTAQ) 400 MG tablet Take 1 tablet (400 mg total) by mouth 2 (two) times daily with a meal. 04/12/15   Thompson Grayer, MD  Elastic Bandages & Supports (SLIP-ON KNEE BRACE) MISC Apply knee brace to knee with activity. 05/26/15   Jazma Y Phelps, DO  ELIQUIS 5 MG TABS tablet TAKE ONE TABLET BY MOUTH TWICE DAILY. 12/01/14   [name], MD  ibuprofen (ADVIL,MOTRIN) 600 MG tablet Take 1 tablet (600 mg total) by mouth every 8 (eight) hours  as needed for moderate pain. 05/26/15   Katheren Shams, DO  MITIGARE 0.6 MG CAPS Take 1 tablet by mouth 2 (two) times daily as needed (gout). 02/24/15   [name], MD  Omega-3 Fatty Acids (FISH OIL) 1000 MG CAPS Take 2,000 mg by mouth daily.     Historical Provider, MD  omeprazole (PRILOSEC) 20 MG capsule TAKE ONE CAPSULE BY MOUTH TWICE DAILY 01/23/15   [name], MD   BP 148/85 mmHg  Pulse 80  Temp(Src) 97.6 F (36.4 C) (Oral)  Resp 18  Ht 6\' 2"  (1.88 m)  Wt 320 lb (145.151 kg)  BMI 41.07 kg/m2  SpO2 96% Physical Exam  Constitutional: He is oriented to person, place, and time. Vital signs are normal. He appears well-developed and well-nourished.  Non-toxic appearance. He does not appear ill. No distress.  HENT:  Head: Normocephalic and atraumatic.  Nose: Nose normal.  Mouth/Throat: Oropharynx is clear and moist. No oropharyngeal exudate.  Eyes: Conjunctivae and EOM are normal. Pupils are equal, round, and reactive to light. No scleral icterus.  Neck: Normal range of motion. Neck supple. No tracheal deviation, no edema, no erythema and normal range of motion present. No thyroid mass and no thyromegaly present.  Cardiovascular: Normal rate, regular rhythm, S1 normal, S2 normal, normal heart sounds, intact distal pulses and normal pulses.  Exam reveals no gallop and no friction rub.   No murmur heard. Pulses:      Radial pulses are 2+ on the right side, and 2+ on the left side.       Dorsalis pedis pulses are 2+ on the right side, and 2+ on the left side.  Pulmonary/Chest: Effort normal and breath sounds normal. No respiratory distress. He has no wheezes. He has no rhonchi. He has no rales.  Abdominal: Soft. Normal appearance and bowel sounds are normal. He exhibits no distension, no ascites and no mass. There is no hepatosplenomegaly. There is no tenderness. There is no rebound, no guarding and no CVA tenderness.  Musculoskeletal: Normal range of motion. He exhibits no  edema or tenderness.  Lymphadenopathy:    He has no cervical adenopathy.  Neurological: He is alert and oriented to person, place, and time. He has normal strength. No cranial nerve deficit or sensory deficit.  Skin: Skin is warm, dry and intact. No petechiae and no rash noted. He is not diaphoretic. No erythema. No pallor.  Psychiatric: He has a normal mood and affect. His behavior is normal. Judgment normal.  Nursing note and vitals reviewed.   ED Course  Procedures  DIAGNOSTIC STUDIES: Oxygen Saturation is 96% on RA, adequate by my interpretation.    COORDINATION OF CARE: 12:13 AM Discussed treatment plan with pt at bedside and  pt agreed to plan. Cardiac workup ordered.    Labs Review Labs Reviewed  BASIC METABOLIC PANEL - Abnormal; Notable for the following:    Glucose, Bld 113 (*)    All other components within normal limits  CBC  PROTIME-INR  I-STAT TROPOININ, ED  Randolm Idol, ED    Imaging Review Dg Chest 2 View  06/10/2015  CLINICAL DATA:  Dyspnea and nonproductive cough. EXAM: CHEST  2 VIEW COMPARISON:  03/03/2015 FINDINGS: The heart size and mediastinal contours are within normal limits. Both lungs are clear. The visualized skeletal structures are unremarkable. IMPRESSION: No active cardiopulmonary disease. Electronically Signed   By: Andreas Newport M.D.   On: 06/10/2015 00:40   I have personally reviewed and evaluated these images and lab results as part of my medical decision-making.   EKG Interpretation   Date/Time:  Friday June 09 2015 23:44:18 EDT Ventricular Rate:  75 PR Interval:    QRS Duration: 86 QT Interval:  374 QTC Calculation: 417 R Axis:   24 Text Interpretation:  Atrial fibrillation Abnormal ECG No significant  change since last tracing Confirmed by Glynn Octave [phone]) on  06/10/2015 12:05:57 AM      MDM   Final diagnoses:  None   Patient presents to the emergency department for palpitations. He has a history of  paroxysmal atrial fibrillation as an office of request. I spoke with Dr. Percival Spanish who recommends to place the patient back on his own eliquis and follow-up with his cardiologist Dr. Rayann Heman. He is not recommending admission for cardioversion. This was relayed to the patient. He demonstrated understanding. Laboratory values unremarkable, chest x-ray is normal. EKG shows atrial fibrillation without any signs of ischemia.  Patient appears well and was observed in the emergency department. Vital signs were within his normal limits and he is safe for discharge.   I personally performed the services described in this documentation, which was scribed in my presence. The recorded information has been reviewed and is accurate.      Everlene Balls, MD 06/10/15 [phone]

## 2015-06-09 NOTE — ED Notes (Signed)
Pt here for non-radiating left side chest pain and SOB, "my heart feels out of rhythm." Hx of afib. He reports he ran out of Eliquis, last dose was Monday. He took 4 baby aspirin PTA around 2300.

## 2015-06-10 ENCOUNTER — Emergency Department (HOSPITAL_COMMUNITY): Payer: 59

## 2015-06-10 LAB — BASIC METABOLIC PANEL
Anion gap: 12 (ref 5–15)
BUN: 12 mg/dL (ref 6–20)
CALCIUM: 9.8 mg/dL (ref 8.9–10.3)
CHLORIDE: 103 mmol/L (ref 101–111)
CO2: 27 mmol/L (ref 22–32)
CREATININE: 1.03 mg/dL (ref 0.61–1.24)
GFR calc non Af Amer: >60 mL/min (ref >60)
Glucose, Bld: 113 mg/dL — ABNORMAL HIGH (ref 65–99)
Potassium: 4.3 mmol/L (ref 3.5–5.1)
SODIUM: 142 mmol/L (ref 135–145)

## 2015-06-10 LAB — PROTIME-INR
INR: 0.94 (ref 0.00–1.49)
Prothrombin Time: 12.8 seconds (ref 11.6–15.2)

## 2015-06-10 LAB — CBC
HCT: 40.8 % (ref 39.0–52.0)
Hemoglobin: 13.5 g/dL (ref 13.0–17.0)
MCH: 27.5 pg (ref 26.0–34.0)
MCHC: 33.1 g/dL (ref 30.0–36.0)
MCV: 83.1 fL (ref 78.0–100.0)
PLATELETS: 227 10*3/uL (ref 150–400)
RBC: 4.91 MIL/uL (ref 4.22–5.81)
RDW: 13 % (ref 11.5–15.5)
WBC: 7.9 10*3/uL (ref 4.0–10.5)

## 2015-06-10 LAB — I-STAT TROPONIN, ED: TROPONIN I, POC: 0 ng/mL (ref 0.00–0.08)

## 2015-06-10 MED ORDER — APIXABAN 5 MG PO TABS
5.0000 mg | ORAL_TABLET | Freq: Once | ORAL | Status: AC
Start: 2015-06-10 — End: 2015-06-10
  Administered 2015-06-10: 5 mg via ORAL
  Filled 2015-06-10: qty 1

## 2015-06-10 MED ORDER — APIXABAN 5 MG PO TABS: 5.0000 mg | ORAL_TABLET | Freq: Two times a day (BID) | ORAL | Status: DC

## 2015-06-10 NOTE — ED Notes (Signed)
Patient transported to X-ray 

## 2015-06-10 NOTE — Discharge Instructions (Signed)
Atrial Fibrillation [name], it is very important for you to take your multaq and eliquis everyday for your a fib.  See Dr. Rayann Heman within 3 days for close follow up in clinic.  If any symptoms worsen, come back to the ED immediately.  Thank you. Atrial fibrillation is a type of heartbeat that is irregular or fast (rapid). If you have this condition, your heart keeps quivering in a weird (chaotic) way. This condition can make it so your heart cannot pump blood normally. Having this condition gives a person more risk for stroke, heart failure, and other heart problems. There are different types of atrial fibrillation. Talk with your doctor to learn about the type that you have. HOME CARE  Take over-the-counter and prescription medicines only as told by your doctor.  If your doctor prescribed a blood-thinning medicine, take it exactly as told. Taking too much of it can cause bleeding. If you do not take enough of it, you will not have the protection that you need against stroke and other problems.  Do not use any tobacco products. These include cigarettes, chewing tobacco, and e-cigarettes. If you need help quitting, ask your doctor.  If you have apnea (obstructive sleep apnea), manage it as told by your doctor.  Do not drink alcohol.  Do not drink beverages that have caffeine. These include coffee, soda, and tea.  Maintain a healthy weight. Do not use diet pills unless your doctor says they are safe for you. Diet pills may make heart problems worse.  Follow diet instructions as told by your doctor.  Exercise regularly as told by your doctor.  Keep all follow-up visits as told by your doctor. This is important. GET HELP IF:  You notice a change in the speed, rhythm, or strength of your heartbeat.  You are taking a blood-thinning medicine and you notice more bruising.  You get tired more easily when you move or exercise. GET HELP RIGHT AWAY IF:  You have pain in your chest or your belly  (abdomen).  You have sweating or weakness.  You feel sick to your stomach (nauseous).  You notice blood in your throw up (vomit), poop (stool), or pee (urine).  You are short of breath.  You suddenly have swollen feet and ankles.  You feel dizzy.  Your suddenly get weak or numb in your face, arms, or legs, especially if it happens on one side of your body.  You have trouble talking, trouble understanding, or both.  Your face or your eyelid droops on one side. These symptoms may be an emergency. Do not wait to see if the symptoms will go away. Get medical help right away. Call your local emergency services (911 in the [place] Do not drive yourself to the hospital.   This information is not intended to replace advice given to you by your health care provider. Make sure you discuss any questions you have with your health care provider.   Document Released: 04/30/2008 Document Revised: 04/12/2015 Document Reviewed: 11/16/2014 Elsevier Interactive Patient Education [employer].

## 2015-06-23 ENCOUNTER — Encounter (HOSPITAL_COMMUNITY): Payer: Self-pay | Admitting: *Deleted

## 2015-06-23 ENCOUNTER — Telehealth: Payer: Self-pay | Admitting: Internal Medicine

## 2015-06-23 ENCOUNTER — Emergency Department (HOSPITAL_COMMUNITY)
Admission: EM | Admit: 2015-06-23 | Discharge: 2015-06-23 | Disposition: A | Discharge status: Home / Self Care | Payer: 59 | Attending: Emergency Medicine | Admitting: Emergency Medicine

## 2015-06-23 ENCOUNTER — Emergency Department (HOSPITAL_COMMUNITY): Payer: 59

## 2015-06-23 DIAGNOSIS — R0789 Other chest pain: Secondary | ICD-10-CM | POA: Insufficient documentation

## 2015-06-23 DIAGNOSIS — G8929 Other chronic pain: Secondary | ICD-10-CM | POA: Insufficient documentation

## 2015-06-23 DIAGNOSIS — M1990 Unspecified osteoarthritis, unspecified site: Secondary | ICD-10-CM | POA: Insufficient documentation

## 2015-06-23 DIAGNOSIS — I480 Paroxysmal atrial fibrillation: Secondary | ICD-10-CM | POA: Insufficient documentation

## 2015-06-23 DIAGNOSIS — M545 Low back pain: Secondary | ICD-10-CM | POA: Insufficient documentation

## 2015-06-23 DIAGNOSIS — Z87891 Personal history of nicotine dependence: Secondary | ICD-10-CM | POA: Insufficient documentation

## 2015-06-23 DIAGNOSIS — G4733 Obstructive sleep apnea (adult) (pediatric): Secondary | ICD-10-CM | POA: Diagnosis not present

## 2015-06-23 DIAGNOSIS — R2240 Localized swelling, mass and lump, unspecified lower limb: Secondary | ICD-10-CM | POA: Diagnosis not present

## 2015-06-23 DIAGNOSIS — I10 Essential (primary) hypertension: Secondary | ICD-10-CM | POA: Diagnosis not present

## 2015-06-23 DIAGNOSIS — G43909 Migraine, unspecified, not intractable, without status migrainosus: Secondary | ICD-10-CM | POA: Diagnosis not present

## 2015-06-23 DIAGNOSIS — R079 Chest pain, unspecified: Secondary | ICD-10-CM | POA: Diagnosis present

## 2015-06-23 DIAGNOSIS — Z880 Allergy status to penicillin: Secondary | ICD-10-CM | POA: Insufficient documentation

## 2015-06-23 DIAGNOSIS — R5383 Other fatigue: Secondary | ICD-10-CM | POA: Insufficient documentation

## 2015-06-23 DIAGNOSIS — R0602 Shortness of breath: Secondary | ICD-10-CM | POA: Diagnosis not present

## 2015-06-23 DIAGNOSIS — K219 Gastro-esophageal reflux disease without esophagitis: Secondary | ICD-10-CM | POA: Diagnosis not present

## 2015-06-23 DIAGNOSIS — M109 Gout, unspecified: Secondary | ICD-10-CM | POA: Diagnosis not present

## 2015-06-23 DIAGNOSIS — Z7901 Long term (current) use of anticoagulants: Secondary | ICD-10-CM | POA: Insufficient documentation

## 2015-06-23 DIAGNOSIS — Z9981 Dependence on supplemental oxygen: Secondary | ICD-10-CM | POA: Diagnosis not present

## 2015-06-23 DIAGNOSIS — Z9889 Other specified postprocedural states: Secondary | ICD-10-CM | POA: Insufficient documentation

## 2015-06-23 LAB — CBC
HCT: 42.5 % (ref 39.0–52.0)
HEMOGLOBIN: 14.7 g/dL (ref 13.0–17.0)
MCH: 28.5 pg (ref 26.0–34.0)
MCHC: 34.6 g/dL (ref 30.0–36.0)
MCV: 82.4 fL (ref 78.0–100.0)
PLATELETS: 230 10*3/uL (ref 150–400)
RBC: 5.16 MIL/uL (ref 4.22–5.81)
RDW: 13 % (ref 11.5–15.5)
WBC: 6.4 10*3/uL (ref 4.0–10.5)

## 2015-06-23 LAB — BASIC METABOLIC PANEL
ANION GAP: 8 (ref 5–15)
BUN: 11 mg/dL (ref 6–20)
CALCIUM: 9.3 mg/dL (ref 8.9–10.3)
CO2: 22 mmol/L (ref 22–32)
CREATININE: 0.95 mg/dL (ref 0.61–1.24)
Chloride: 111 mmol/L (ref 101–111)
Glucose, Bld: 115 mg/dL — ABNORMAL HIGH (ref 65–99)
POTASSIUM: 3.6 mmol/L (ref 3.5–5.1)
SODIUM: 141 mmol/L (ref 135–145)

## 2015-06-23 LAB — I-STAT TROPONIN, ED
TROPONIN I, POC: 0.01 ng/mL (ref 0.00–0.08)
Troponin i, poc: 0.01 ng/mL (ref 0.00–0.08)

## 2015-06-23 LAB — D-DIMER, QUANTITATIVE: D-Dimer, Quant: 0.38 ug/mL-FEU (ref 0.00–0.50)

## 2015-06-23 MED ORDER — NITROGLYCERIN 0.4 MG SL SUBL: 0.4000 mg | SUBLINGUAL_TABLET | SUBLINGUAL | Status: DC | PRN

## 2015-06-23 MED ORDER — ASPIRIN 81 MG PO CHEW
324.0000 mg | CHEWABLE_TABLET | Freq: Once | ORAL | Status: AC
  Administered 2015-06-23: 324 mg via ORAL
  Filled 2015-06-23: qty 4

## 2015-06-23 MED ORDER — KETOROLAC TROMETHAMINE 30 MG/ML IJ SOLN
30.0000 mg | Freq: Once | INTRAMUSCULAR | Status: AC
  Administered 2015-06-23: 30 mg via INTRAVENOUS
  Filled 2015-06-23: qty 1

## 2015-06-23 NOTE — ED Provider Notes (Signed)
CSN: [id number]     Arrival date & time 06/23/15  1409 History   First MD Initiated Contact with Patient 06/23/15 1545     Chief Complaint  Patient presents with  . Atrial Fibrillation  . Chest Pain     (Consider location/radiation/quality/duration/timing/severity/associated sxs/prior Treatment) HPI  49 year old male with a history of paroxysmal A. fib presents with intermittent atrial fibrillation since around 5 AM. Patient has been having chest pain since around 9:30 that has been constant. It is left-sided, sharp, and feels like it wraps around and goes through his back. Some shortness of breath. Worsens with inspiration. Chronically has lower leg swelling but denies any worsening. Rates the pain as a 6 out of 10. Has never had pain like this before. Has a cardiology for A. fib but denies any CAD. Has not taken anything for the pain. He felt weak and tired all day from A. Fib.   Past Medical History  Diagnosis Date  . Arthritis   . 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 History of MRI of cervical spine 09/25/10    normal; left shoulder normal as well  . Gout   . Chronic knee pain   . Chronic foot pain   . Hypertension   . Chronic elbow pain   . Chronic shoulder pain   . Chronic hip pain   . OSA on CPAP   . GERD (gastroesophageal reflux disease)   . Migraine     "about monthly" (03/03/2015)  . Chronic lower back pain   . Morbid obesity ([place])   . Paroxysmal atrial fibrillation ([place]) 7/15    chads2vasc score is 1   Past Surgical History  Procedure Laterality Date  . Knee arthroscopy Right 02/2013  . Cardiac catheterization  05/2005    "no blockage"   Family History  Problem Relation Age of Onset  . Atrial fibrillation Mother 60    had a stroke   Social History  Substance Use Topics  . Smoking status: Former Smoker -- 1.00 packs/day for 10 years    Types: Cigarettes    Quit date: 04/02/1994  .  Smokeless tobacco: Former Systems developer    Types: Chew     Comment: "chewed in my teens"  . Alcohol Use: 0.0 oz/week    0 Standard drinks or equivalent per week     Comment: "stopped in 1994"    Review of Systems  Constitutional: Positive for fatigue.  Respiratory: Positive for shortness of breath.   Cardiovascular: Positive for chest pain and leg swelling.  Gastrointestinal: Negative for nausea, vomiting and abdominal pain.  Musculoskeletal: Positive for back pain.  All other systems reviewed and are negative.     Allergies  Hydrochlorothiazide-propranolol and Penicillins  Home Medications   Prior to Admission medications   Medication Sig Start Date End Date Taking? Authorizing Provider  apixaban (ELIQUIS) 5 MG TABS tablet Take 1 tablet (5 mg total) by mouth 2 (two) times daily. 06/10/15  Yes Everlene Balls, MD  aspirin-acetaminophen-caffeine (EXCEDRIN MIGRAINE) [phone] MG per tablet Take 2 tablets by mouth every 6 (six) hours as needed for headache.   Yes Historical Provider, MD  cyclobenzaprine (FLEXERIL) 5 MG tablet Take 1 tablet (5 mg total) by mouth 3 (three) times daily as needed for muscle spasms. 05/26/15  Yes Katheren Shams, DO  dronedarone (MULTAQ) 400 MG tablet Take 1 tablet (400 mg total) by mouth 2 (two) times daily with a  meal. 04/12/15  Yes Thompson Grayer, MD  ibuprofen (ADVIL,MOTRIN) 600 MG tablet Take 1 tablet (600 mg total) by mouth every 8 (eight) hours as needed for moderate pain. 05/26/15  Yes Jazma Y Phelps, DO  MITIGARE 0.6 MG CAPS Take 1 tablet by mouth 2 (two) times daily as needed (gout). 02/24/15  Yes [name], MD  Omega-3 Fatty Acids (FISH OIL) 1000 MG CAPS Take 2,000 mg by mouth daily.    Yes Historical Provider, MD  omeprazole (PRILOSEC) 20 MG capsule TAKE ONE CAPSULE BY MOUTH TWICE DAILY 01/23/15  Yes [name], MD   BP 141/82 mmHg  Pulse 66  Temp(Src) 97.6 F (36.4 C) (Oral)  Resp 18  Ht 6\' 2"  (1.88 m)  Wt 325 lb (147.419 kg)  BMI 41.71  kg/m2  SpO2 96% Physical Exam  Constitutional: He is oriented to person, place, and time. He appears well-developed and well-nourished.  Morbidly obese  HENT:  Head: Normocephalic and atraumatic.  Right Ear: External ear normal.  Left Ear: External ear normal.  Nose: Nose normal.  Eyes: Right eye exhibits no discharge. Left eye exhibits no discharge.  Neck: Neck supple.  Cardiovascular: Normal rate, regular rhythm, normal heart sounds and intact distal pulses.   Pulmonary/Chest: Effort normal and breath sounds normal. He exhibits tenderness.    Abdominal: Soft. There is no tenderness.  Musculoskeletal: He exhibits no edema.  Neurological: He is alert and oriented to person, place, and time.  Skin: Skin is warm and dry.  Nursing note and vitals reviewed.   ED Course  Procedures (including critical care time) Labs Review Labs Reviewed  BASIC METABOLIC PANEL - Abnormal; Notable for the following:    Glucose, Bld 115 (*)    All other components within normal limits  CBC  D-DIMER, QUANTITATIVE (NOT AT [facility])  Randolm Idol, ED  Randolm Idol, ED    Imaging Review Dg Chest 2 View  06/23/2015  CLINICAL DATA:  Chest pain.  Atrial fibrillation. EXAM: CHEST  2 VIEW COMPARISON:  06/10/2015 FINDINGS: Heart size and pulmonary vascularity are normal and the lungs are clear except for slight scarring at the left lung base. No effusions. No osseous abnormality. IMPRESSION: No active cardiopulmonary disease. Electronically Signed   By: Lorriane Shire M.D.   On: 06/23/2015 14:46   I have personally reviewed and evaluated these images and lab results as part of my medical decision-making.   EKG Interpretation   Date/Time:  Friday June 23 2015 15:29:29 EST Ventricular Rate:  56 PR Interval:  203 QRS Duration: 104 QT Interval:  421 QTC Calculation: 406 R Axis:   18 Text Interpretation:  Sinus rhythm Borderline prolonged PR interval no  significant change since earlier in the  day Confirmed by August ([id number]) on 06/23/2015 3:44:59 PM      MDM   Final diagnoses:  Atypical chest pain    Patient with atypical chest pain for several hours. He is no longer in A. fib, has been sinus rhythm the whole time here. Pain is reproducible, likely musculoskeletal. Discussed patient with Dr. Marlou Porch who recommends second troponin and follow-up closely with his outpatient cardiologist. Low risk for PE, ddimer is negative. Doubt dissection    Sherwood Gambler, MD 06/24/15 0006

## 2015-06-23 NOTE — ED Notes (Signed)
Pt with hx of paroxysmal afib sent to ED by cardiologist.  States he woke up from sleep with sternal chest pain that radiated in between his shoulder blades and "my heart was fluttering like a-fib".

## 2015-06-23 NOTE — Telephone Encounter (Signed)
Patient c/o Palpitations:  High priority if patient c/o lightheadedness and shortness of breath.  1. How long have you been having palpitations? Since 5:30am this morning  2. Are you currently experiencing lightheadedness and shortness of breath?Yes both  3. Have you checked your BP and heart rate? (document readings) 144/93/ 79  4. Are you experiencing any other symptoms? Pain in back of heart

## 2015-06-23 NOTE — Telephone Encounter (Signed)
Patient complaining of chest pain, SOB, and lightheadedness. Patient states he is in A.Fib and has been in it since 5:30 am this morning. BP 144/93 HR 79. Advised patient to go to ED, but patient states that his A. Fib is not that bad. Informed patient that the DOD or FLEX would be consulted. Consulted with Mare Loan PA (FLEX) she suggest that patient should go to ED as well. Called patient back and informed him of her suggestion. Patient verbalized understanding and will go to ED.

## 2015-07-04 ENCOUNTER — Telehealth: Payer: Self-pay

## 2015-07-06 ENCOUNTER — Telehealth: Payer: Self-pay

## 2015-07-06 NOTE — Telephone Encounter (Signed)
Multaq was previously approved. [place] [id number].

## 2015-07-07 NOTE — Telephone Encounter (Signed)
This encounter opened in error.

## 2015-07-10 ENCOUNTER — Ambulatory Visit (INDEPENDENT_AMBULATORY_CARE_PROVIDER_SITE_OTHER): Payer: 59 | Admitting: Cardiovascular Disease

## 2015-07-10 ENCOUNTER — Encounter: Payer: Self-pay | Admitting: Cardiovascular Disease

## 2015-07-10 VITALS — BP 122/100 | HR 64 | Ht 74.0 in | Wt 330.6 lb

## 2015-07-10 DIAGNOSIS — I10 Essential (primary) hypertension: Secondary | ICD-10-CM

## 2015-07-10 DIAGNOSIS — Z13220 Encounter for screening for lipoid disorders: Secondary | ICD-10-CM

## 2015-07-10 DIAGNOSIS — I480 Paroxysmal atrial fibrillation: Secondary | ICD-10-CM

## 2015-07-10 NOTE — Patient Instructions (Signed)

## 2015-07-10 NOTE — Progress Notes (Signed)
Celedonio Miyamoto Date of Birth  [birth year]       [facility] Office [address], [place], [place]  [address]   [place], [place]  [address] [place]   Fax  [phone]     Fax [phone]  Problem List: 1. Atrial fibrillation 2. Hypertension 3. Gout 4. Gastroesophageal reflux disease 5. Obstructive sleep apnea 6. Hyperlipidemia  History of Present Illness: Chrisean is a 49 year old gentleman who was recently admitted to [facility] with an episode of rapid atrial fibrillation.  He converted on a diltiazem drip.  He did well until this past Saturday when He had another episode of atrial fibrillation.  He does not drink any alcohol.  Does not smoke cigarettes.  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  Echo : Left ventricle: The cavity size was normal. Wall thickness was increased in a pattern of mild LVH. Systolic function was normal. The estimated ejection fraction was in the range of 55% to 60%. Wall motion was normal; there were no regional wall motion abnormalities. Features are consistent with a pseudonormal left ventricular filling pattern, with concomitant abnormal relaxation and increased filling pressure (grade 2 diastolic dysfunction). - Aortic valve: There was no stenosis. - Mitral valve: There was no significant regurgitation. - Left atrium: The atrium was mildly dilated. - Right ventricle: The cavity size was mildly dilated. Systolic function was normal. - Right atrium: The atrium was mildly dilated. - Pulmonary arteries: No complete TR doppler jet so unable to estimate PA systolic pressure. - Systemic veins: IVC measured 2.1 cm with normal respirophasic variation, suggesting RA pressure 8 mmHg  He has done well.  He has joined the gym   Dec. 5, 2016:  Paxon is seen today for follow-up on emergency room visit. He was seen in the ER for hypertension and atrial  fibrillation. Converted back to NSR while in the ER. Has had more paroxysmal atrial fib.  - had a bad episode 2 weeks ago .  Had run out of his Eliquis ( pharmacy could not get it for some reason )  Has been to the A-fib clinic  BP is higher - had more salt than usual yesterday .  BP is typically 120s / 80s. Has tried amlodipine and had episodes of hypotension .   Current Outpatient Prescriptions on File Prior to Visit  Medication Sig Dispense Refill  . apixaban (ELIQUIS) 5 MG TABS tablet Take 1 tablet (5 mg total) by mouth 2 (two) times daily. 30 tablet 0  . aspirin-acetaminophen-caffeine (EXCEDRIN MIGRAINE) [id number] MG per tablet Take 2 tablets by mouth every 6 (six) hours as needed for headache.    . cyclobenzaprine (FLEXERIL) 5 MG tablet Take 1 tablet (5 mg total) by mouth 3 (three) times daily as needed for muscle spasms. 30 tablet 0  . dronedarone (MULTAQ) 400 MG tablet Take 1 tablet (400 mg total) by mouth 2 (two) times daily with a meal. 60 tablet 11  . ibuprofen (ADVIL,MOTRIN) 600 MG tablet Take 1 tablet (600 mg total) by mouth every 8 (eight) hours as needed for moderate pain. 30 tablet 0  . MITIGARE 0.6 MG CAPS Take 1 tablet by mouth 2 (two) times daily as needed (gout). 30 capsule 1  . Omega-3 Fatty Acids (FISH OIL) 1000 MG CAPS Take 2,000 mg by mouth daily.     Marland Kitchen omeprazole (PRILOSEC) 20 MG capsule TAKE  ONE CAPSULE BY MOUTH TWICE DAILY 60 capsule 3   No current facility-administered medications on file prior to visit.    Allergies  Allergen Reactions  . Hydrochlorothiazide-Propranolol [Propranolol-Hctz] Other (See Comments)    Joints lock up  . Penicillins Other (See Comments)    Makes joints stiff and unable to move    Past Medical History  Diagnosis Date  . Arthritis   . 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 History of MRI of cervical spine 09/25/10    normal; left shoulder normal as well  .  Gout   . Chronic knee pain   . Chronic foot pain   . Hypertension   . Chronic elbow pain   . Chronic shoulder pain   . Chronic hip pain   . OSA on CPAP   . GERD (gastroesophageal reflux disease)   . Migraine     "about monthly" (03/03/2015)  . Chronic lower back pain   . Morbid obesity ([place])   . Paroxysmal atrial fibrillation ([place]) 7/15    chads2vasc score is 1    Past Surgical History  Procedure Laterality Date  . Knee arthroscopy Right 02/2013  . Cardiac catheterization  05/2005    "no blockage"    History  Smoking status  . Former Smoker -- 1.00 packs/day for 10 years  . Types: Cigarettes  . Quit date: 04/02/1994  Smokeless tobacco  . Former Systems developer  . Types: Chew    Comment: "chewed in my teens"    History  Alcohol Use  . 0.0 oz/week  . 0 Standard drinks or equivalent per week    Comment: "stopped in 1994"    Family History  Problem Relation Age of Onset  . Atrial fibrillation Mother 38    had a stroke    Reviw of Systems:  Reviewed in the HPI.  All other systems are negative.  Physical Exam: Blood pressure 122/100, pulse 64, height 6\' 2"  (1.88 m), weight 330 lb 9.6 oz (149.959 kg), SpO2 93 %. Wt Readings from Last 3 Encounters:  07/10/15 330 lb 9.6 oz (149.959 kg)  06/23/15 325 lb (147.419 kg)  06/09/15 320 lb (145.151 kg)     General: Well developed, well nourished, in no acute distress.  Head: Normocephalic, atraumatic, sclera non-icteric, mucus membranes are moist,   Neck: Supple. Carotids are 2 + without bruits. No JVD   Lungs: Clear   Heart: RR, bradycardia  Abdomen: Soft, non-tender, non-distended with normal bowel sounds.  Msk:  Strength and tone are normal   Extremities: No clubbing or cyanosis. No edema.  Distal pedal pulses are 2+ and equal    Neuro: CN II - XII intact.  Alert and oriented X 3.   Psych:  Normal   ECG: Sept. 9, 2015:  Sinus brady at 58.    Assessment / Plan:   1. Atrial fibrillation -   he has been seen in  the atrial fibrillation clinic. He seems to be fairly well-controlled on Multaq.  If he has recurrent episodes of paroxysmal atrial fibrillation, I've encouraged him to call Roderic Palau in the [place] clinic for another appointment.  2. Hypertension- blood pressure is a little high today. I've encouraged him to watch his diet to lose some weight. He admits eating a little bit of extra salt recently. 3. Gout 4. Gastroesophageal reflux disease 5. Obstructive sleep apnea    Aliou Mealey, Wonda Cheng, MD  07/10/2015 9:07  AM    [facility] [place],  [place], [place]  [address] Pager [phone] Phone: [phone]; Fax: [phone]   [facility]  [address] [place], [place]  [address] [phone]   Fax [phone]

## 2015-07-12 ENCOUNTER — Other Ambulatory Visit: Payer: Self-pay | Admitting: Family Medicine

## 2015-08-01 ENCOUNTER — Telehealth: Payer: Self-pay

## 2015-08-01 NOTE — Telephone Encounter (Signed)
Prior auth for Multaq 400mg  sent to [employer].

## 2015-08-14 ENCOUNTER — Ambulatory Visit (HOSPITAL_COMMUNITY): Payer: 59 | Admitting: Nurse Practitioner

## 2015-08-14 ENCOUNTER — Inpatient Hospital Stay (HOSPITAL_COMMUNITY): Admission: RE | Admit: 2015-08-14 | Payer: 59 | Source: Ambulatory Visit | Admitting: Nurse Practitioner

## 2015-08-21 ENCOUNTER — Encounter (HOSPITAL_COMMUNITY): Payer: Self-pay | Admitting: Nurse Practitioner

## 2015-08-21 ENCOUNTER — Ambulatory Visit (HOSPITAL_COMMUNITY)
Admission: RE | Admit: 2015-08-21 | Discharge: 2015-08-21 | Disposition: A | Discharge status: Home / Self Care | Payer: 59 | Source: Ambulatory Visit | Attending: Nurse Practitioner | Admitting: Nurse Practitioner

## 2015-08-21 VITALS — BP 128/86 | HR 71 | Ht 74.0 in | Wt 330.6 lb

## 2015-08-21 DIAGNOSIS — Z87891 Personal history of nicotine dependence: Secondary | ICD-10-CM | POA: Insufficient documentation

## 2015-08-21 DIAGNOSIS — Z880 Allergy status to penicillin: Secondary | ICD-10-CM | POA: Insufficient documentation

## 2015-08-21 DIAGNOSIS — Z79899 Other long term (current) drug therapy: Secondary | ICD-10-CM | POA: Insufficient documentation

## 2015-08-21 DIAGNOSIS — Z888 Allergy status to other drugs, medicaments and biological substances status: Secondary | ICD-10-CM | POA: Diagnosis not present

## 2015-08-21 DIAGNOSIS — Z7902 Long term (current) use of antithrombotics/antiplatelets: Secondary | ICD-10-CM | POA: Diagnosis not present

## 2015-08-21 DIAGNOSIS — I4891 Unspecified atrial fibrillation: Secondary | ICD-10-CM | POA: Insufficient documentation

## 2015-08-21 DIAGNOSIS — K219 Gastro-esophageal reflux disease without esophagitis: Secondary | ICD-10-CM | POA: Diagnosis not present

## 2015-08-21 DIAGNOSIS — Z823 Family history of stroke: Secondary | ICD-10-CM | POA: Insufficient documentation

## 2015-08-21 DIAGNOSIS — I480 Paroxysmal atrial fibrillation: Secondary | ICD-10-CM

## 2015-08-21 DIAGNOSIS — I10 Essential (primary) hypertension: Secondary | ICD-10-CM | POA: Insufficient documentation

## 2015-08-21 DIAGNOSIS — G4733 Obstructive sleep apnea (adult) (pediatric): Secondary | ICD-10-CM | POA: Insufficient documentation

## 2015-08-21 DIAGNOSIS — Z6841 Body Mass Index (BMI) 40.0 and over, adult: Secondary | ICD-10-CM | POA: Diagnosis not present

## 2015-08-21 LAB — MAGNESIUM: MAGNESIUM: 2.1 mg/dL (ref 1.7–2.4)

## 2015-08-21 LAB — BASIC METABOLIC PANEL
Anion gap: 8 (ref 5–15)
BUN: 10 mg/dL (ref 6–20)
CALCIUM: 9.5 mg/dL (ref 8.9–10.3)
CO2: 26 mmol/L (ref 22–32)
CREATININE: 1.06 mg/dL (ref 0.61–1.24)
Chloride: 107 mmol/L (ref 101–111)
GFR calc Af Amer: >60 mL/min (ref >60)
GLUCOSE: 104 mg/dL — AB (ref 65–99)
Potassium: 4.5 mmol/L (ref 3.5–5.1)
Sodium: 141 mmol/L (ref 135–145)

## 2015-08-21 NOTE — Patient Instructions (Signed)
Gay Filler will be in touch with you for appointment to discuss [place] admission

## 2015-08-21 NOTE — Progress Notes (Signed)
Patient ID: [name], male   DOB: [birth year], 50 y.o.   MRN: [id number]     Primary Care Physician: Wayne Netters, MD Referring Physician: Dr. Naomie Leblanc is a 50 y.o. male with a h/o atrial fibrillation 7/15 after presenting with tachypalpitations. In retrospect he has had symptoms of afib for about 5 years. He would typically take 4-5 baby aspirins and it would resolve. He was placed on eliquis and followed by Dr [name]. He was noncompliant with follow-up. He reports increase in episodes of afib about once twice a week now can last up to 12 hours. 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 He was seen by Dr. Rayann Leblanc in September 2016 and his recommendation was  to try multaq and he failed, then try tikosyn. Due to his weight, he felt he would not be a good candidate for ablation.   He has occasional 2 pillow orthopnea but states that he can walk on the eliptical for 35 minutes without trouble. He continues to try to make lifestyle change with diet modification and exercise.  Today, he denies symptoms of palpitations, chest pain, shortness of breath, orthopnea, PND, lower extremity edema, dizziness, presyncope, syncope, or neurologic sequela. The patient is tolerating medications without difficulties and is otherwise without complaint today.   Past Medical History  Diagnosis Date  . Arthritis   . 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 History of MRI of cervical spine 09/25/10    normal; left shoulder normal as well  . Gout   . Chronic knee pain   . Chronic foot pain   . Hypertension   . Chronic elbow pain   . Chronic shoulder pain   . Chronic hip pain   . OSA on CPAP   . GERD (gastroesophageal reflux disease)   . Migraine     "about monthly" (03/03/2015)  . Chronic lower back pain   . Morbid obesity ([place])   . Paroxysmal atrial fibrillation ([place]) 7/15    chads2vasc  score is 1   Past Surgical History  Procedure Laterality Date  . Knee arthroscopy Right 02/2013  . Cardiac catheterization  05/2005    "no blockage"    Current Outpatient Prescriptions  Medication Sig Dispense Refill  . aspirin-acetaminophen-caffeine (EXCEDRIN MIGRAINE) 250-250-65 MG per tablet Take 2 tablets by mouth every 6 (six) hours as needed for headache.    . cyclobenzaprine (FLEXERIL) 5 MG tablet Take 1 tablet (5 mg total) by mouth 3 (three) times daily as needed for muscle spasms. 30 tablet 0  . dronedarone (MULTAQ) 400 MG tablet Take 1 tablet (400 mg total) by mouth 2 (two) times daily with a meal. 60 tablet 11  . ELIQUIS 5 MG TABS tablet TAKE ONE TABLET BY MOUTH TWICE DAILY 60 tablet 5  . ibuprofen (ADVIL,MOTRIN) 600 MG tablet Take 1 tablet (600 mg total) by mouth every 8 (eight) hours as needed for moderate pain. 30 tablet 0  . MITIGARE 0.6 MG CAPS Take 1 tablet by mouth 2 (two) times daily as needed (gout). 30 capsule 1  . Omega-3 Fatty Acids (FISH OIL) 1000 MG CAPS Take 2,000 mg by mouth daily.     Marland Kitchen omeprazole (PRILOSEC) 20 MG capsule TAKE ONE CAPSULE BY MOUTH TWICE DAILY 60 capsule 3   No current facility-administered medications for this encounter.    Allergies  Allergen Reactions  .  Hydrochlorothiazide-Propranolol [Propranolol-Hctz] Other (See Comments)    Joints lock up  . Penicillins Other (See Comments)    Makes joints stiff and unable to move    Social History   Social History  . Marital Status: Married    Spouse Name: N/A  . Number of Children: N/A  . Years of Education: N/A   Occupational History  . Not on file.   Social History Main Topics  . Smoking status: Former Smoker -- 1.00 packs/day for 10 years    Types: Cigarettes    Quit date: 04/02/1994  . Smokeless tobacco: Former Systems developer    Types: Chew     Comment: "chewed in my teens"  . Alcohol Use: 0.0 oz/week    0 Standard drinks or equivalent per week     Comment: "stopped in 1994"  . Drug  Use: Yes    Special: Marijuana     Comment: "stopped marijuna in 1994"  . Sexual Activity: Yes   Other Topics Concern  . Not on file   Social History Narrative   Lives in [place] with spouse.  3 children, all grown.   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 Atrial fibrillation Mother 45    had a stroke    ROS- All systems are reviewed and negative except as per the HPI above  Physical Exam: Filed Vitals:   08/21/15 1013  BP: 128/86  Pulse: 71  Height: 6\' 2"  (1.88 m)  Weight: 330 lb 9.6 oz (149.959 kg)    GEN- The patient is well appearing, alert and oriented x 3 today.   Head- normocephalic, atraumatic Eyes-  Sclera clear, conjunctiva pink Ears- hearing intact Oropharynx- clear Neck- supple, no JVP Lymph- no cervical lymphadenopathy Lungs- Clear to ausculation bilaterally, normal work of breathing Heart- Regular rate and rhythm, no murmurs, rubs or gallops, PMI not laterally displaced GI- soft, NT, ND, + BS Extremities- no clubbing, cyanosis, or edema MS- no significant deformity or atrophy Skin- no rash or lesion Psych- euthymic mood, full affect Neuro- strength and sensation are intact  EKG- SR at 71 bpm, pr int 178 ms, qrs int 92 ms, qtc 404 ms. Epic records reviewed   Assessment and Plan: 1. Afib  Having increased afib burden on multaq Discussed with pt about stopping multaq and initiation of tikosyn He would like to pursue Will obtain bmet/mag today and [facility] for [facility] discussion and set up for admission for tikosyn. Continue eliquis May take 12.5 mg metoprolol if breakthrough afib if Sys BP/HR over 100.  2. Obesity Continue weight loss efforts/ try to establish regular exercise  Afib clinic as needed  [name] July, [facility] [address] [place], [place] [address] [phone]

## 2015-08-22 ENCOUNTER — Other Ambulatory Visit: Payer: Self-pay | Admitting: Family Medicine

## 2015-08-30 ENCOUNTER — Encounter: Payer: Self-pay | Admitting: Family Medicine

## 2015-08-30 ENCOUNTER — Ambulatory Visit (INDEPENDENT_AMBULATORY_CARE_PROVIDER_SITE_OTHER): Payer: 59 | Admitting: Family Medicine

## 2015-08-30 VITALS — BP 160/87 | HR 65 | Temp 97.7°F | Ht 74.0 in | Wt 332.0 lb

## 2015-08-30 DIAGNOSIS — M79675 Pain in left toe(s): Secondary | ICD-10-CM

## 2015-08-30 DIAGNOSIS — M109 Gout, unspecified: Secondary | ICD-10-CM | POA: Diagnosis not present

## 2015-08-30 MED ORDER — PREDNISONE 10 MG PO TABS: ORAL_TABLET | ORAL | Status: DC

## 2015-08-30 NOTE — Patient Instructions (Signed)
Thank you for coming in to clinic today.  1. You most likely have Gout flare in Left big toe - Since it has been 6 days, the Mitigare is less likely to be effective, stop taking this now - Start Prednisone taper over 8 days, follow instructions 4 pills for 2 days, 3 pills for 2 days, 2 pills for 2 days, 1 pill for 2 days then stop - You may take Tylenol or anti-inflammatory as needed - Elevate foot to reduce swelling, try to rest and prevent worsening pain - Time is needed for it to heal, reduce intake of large protein amounts, reduce red meat and alcohol  Please schedule a follow-up appointment with Dr Ardelia Mems in 1 to 3 months for GOUT to discuss prevention and medications, once flare is resolved  If you have any other questions or concerns, please feel free to call the clinic to contact me. You may also schedule an earlier appointment if necessary.  However, if your symptoms get significantly worse, please go to the Emergency Department to seek immediate medical attention.  [name], [place]

## 2015-08-30 NOTE — Assessment & Plan Note (Addendum)
Clinically consistent with acute on chronic gout flare of recurrent Left great toe MTP, onset 1/19 (6 days ago) with gradual improvement to colchicine 0.6mg  BID x 4 days and Ibuprofen 600. Differential Dx - unlikely trauma without known inciting injury, unlikely OA, no sign of infection or systemic symptoms. - Last flare 3-6 months ago, same location - Last uric acid level 7.5 (2012) - No longer on uric acid lowering therapy (off Allopurinol since 12/2013, trial off)  Plan: 1. Stop taking colchicine since >72 hours of onset and no significant resolution, and not taking preventatively only PRN for flares. 2. Discussed NSAIDs vs Prednisone, decision to proceed with Prednisone taper over 8 days, since Ibuprofen was not providing relief previously. 3. Avoid excessive ambulation, relative rest, ice if helps, can take Tylenol PRN 4. Declined note for work - still able to work on reduced duty 5. Avoid food triggers 6. Follow-up with PCP within 4 weeks once acute flare resolved, re-discuss allopurinol or other uric acid lower therapy and prevention

## 2015-08-30 NOTE — Progress Notes (Signed)
Subjective:    Patient ID: [name], male    DOB: 1965/10/08, 50 y.o.   MRN: [id number]  Greydon Baquet is a 50 y.o. male presenting on 08/30/2015 for knot on left foot  Patient presents for a same day appointment.  HPI  LEFT FOOT PAIN, KNOT / GOUT - Chronic history of gout. - Reports that symptoms started on last Thursday about 6 days ago in the evening, denies any inciting injury, trauma, or fall. He did go to the gym which is part of his routine, denied any change in his activities recently. Overall course was worst pain on Sat/Sun, now with gradual improvement over past 2-3 days. Described symptoms with sharp stabbing pain in Left big toe MTP joint, severe pain even to light touch, worse with ambulation and touching it. He thought it may be gout, last flare 3-6 months ago previously responded to the [place] pills after few days, he took the [place] 0.6mg  caps one in morning and one in evening for past 4 days, also took Ibuprofen 600mg  x 1 today. No significant improvement. Admits to eating lighter but does eat protein bar daily for past 1 month, denies inc red meat or beer.  Currently employed at [employer], loading and unloading trucks. He still goes to work with foot pain, and able to do lighter duty, and denies needing any note to be out of work.   Social History   Social History  . Marital Status: Married    Spouse Name: N/A  . Number of Children: N/A  . Years of Education: N/A   Occupational History  . Not on file.   Social History Main Topics  . Smoking status: Former Smoker -- 1.00 packs/day for 10 years    Types: Cigarettes    Quit date: 04/02/1994  . Smokeless tobacco: Former Systems developer    Types: Chew     Comment: "chewed in my teens"  . Alcohol Use: 0.0 oz/week    0 Standard drinks or equivalent per week     Comment: "stopped in 1994"  . Drug Use: Yes    Special: Marijuana     Comment: "stopped marijuna in 1994"  . Sexual Activity: Yes   Other Topics  Concern  . Not on file   Social History Narrative   Lives in [place] with spouse.  3 children, all grown.   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 No distress.  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  Skin: Skin is warm and dry. He is not diaphoretic. There is erythema (mild erythema of left great toe MTP).  Nursing  note and vitals reviewed.      Assessment & Plan:   Problem List Items Addressed This Visit    GOUT - Primary (Chronic)    Clinically consistent with acute gout flare of Left great toe MTP, onset 1/19 (6 days ago) with gradual improvement to colchicine 0.6mg  BID x 4 days and Ibuprofen 600. Differential Dx - unlikely trauma without known inciting injury, unlikely OA, no sign of infection or systemic symptoms. - Last uric acid level 7.5 (2012) - No longer on uric acid lowering therapy (off Allopurinol since 12/2013, trial off)  Plan: 1. Stop taking colchicine since >72 hours of onset and no significant resolution, and not taking preventatively only PRN for flares. 2. Discussed NSAIDs vs Prednisone, decision to proceed with Prednisone taper over 8 days, since Ibuprofen was not  providing relief previously. 3. Avoid excessive ambulation, relative rest, ice if helps, can take Tylenol PRN 4. Declined note for work - still able to work on reduced duty 5. Avoid food triggers 6. Follow-up with PCP within 4 weeks once acute flare resolved, re-discuss allopurinol or other uric acid lower therapy and prevention      Relevant Medications   predniSONE (DELTASONE) 10 MG tablet    Other Visit Diagnoses    Great toe pain, left        Relevant Medications    predniSONE (DELTASONE) 10 MG tablet       Meds ordered this encounter  Medications  . predniSONE (DELTASONE) 10 MG tablet    Sig: Take 4 tablets (40mg ) for 2 days, then 3 tab (30mg ) for 2 days, then 2 tab (20mg ) for 2 days, then 1 tab (10mg ) for 2 days.    Dispense:  20 tablet    Refill:  0      Follow up plan: Return in about 4 weeks (around 09/27/2015) for gout prevention.  Nobie Putnam, [place], PGY-3

## 2015-09-04 ENCOUNTER — Ambulatory Visit (INDEPENDENT_AMBULATORY_CARE_PROVIDER_SITE_OTHER): Payer: 59 | Admitting: Family Medicine

## 2015-09-04 ENCOUNTER — Ambulatory Visit
Admission: RE | Admit: 2015-09-04 | Discharge: 2015-09-04 | Disposition: A | Discharge status: Home / Self Care | Payer: 59 | Source: Ambulatory Visit | Attending: Family Medicine | Admitting: Family Medicine

## 2015-09-04 ENCOUNTER — Encounter: Payer: Self-pay | Admitting: Family Medicine

## 2015-09-04 VITALS — BP 141/86 | HR 57 | Temp 98.6°F | Wt 329.0 lb

## 2015-09-04 DIAGNOSIS — M10072 Idiopathic gout, left ankle and foot: Secondary | ICD-10-CM

## 2015-09-04 MED ORDER — PREDNISONE 20 MG PO TABS: 60.0000 mg | ORAL_TABLET | Freq: Every day | ORAL | Status: DC

## 2015-09-04 NOTE — Progress Notes (Signed)
Date of Visit: 09/04/2015   HPI:  Patient presents to discuss left foot/toe pain. Has had pain for 10 days. No preceding injury, but he had worked out at gym on an elliptical machine prior to the pain. Pain is worse with movement & walking. Seen at [facility] on 1/25 and given rx for prednisone taper for presumed gout. Has history of gout and similar attacks in this foot in the past. Prednisone helped maybe a little, but overall has not helped very much. Also taking tylenol 650mg  q6h and that is not helping. He works in Systems developer. Denies fever, nausea, vomiting, or streaking up his leg.  ROS: See HPI.  [place]: history of afib, gerd, gout, hyperlipidemia, hypertension, migraine, OSA, morbid obesity  PHYSICAL EXAM: BP 141/86 mmHg  Pulse 57  Temp(Src) 98.6 F (37 C)  Wt 329 lb (149.233 kg) Gen: NAD, pleasant, cooperative HEENT: NCAT Lungs: normal work of breathing  Neuro: alert, grossly nonfocal, speech normal Ext: left first MTP joint tender to palpation with mild overlying erythema and warmth. Midfoot mildly edematous. 2+ DP pulses bilaterally. Able to move toes. Brisk capillary refill. Sensation intact. No calf tenderness or swelling.  ASSESSMENT/PLAN:  Foot pain: likely gout flare. Systemically well, speaks against infection. No calf tenderness to suggest DVT, and patient is on xarelto making this less likely. Previously tried colchicine without any relief.  - May need stronger dose of prednisone (max dose patient took was 40mg  daily for 2 days). Will rx 60mg  for 5 days. - check foot xrays to ensure no fracture - given postop shoe to help with pain while ambulating - follow up later this week if not improved  FOLLOW UP: Follow up as needed if symptoms worsen or fail to improve.    [place]raham. Ardelia Mems, [place]

## 2015-09-04 NOTE — Patient Instructions (Signed)
Prednisone 60mg  daily for 5 days Return if no better later this week Go get xrays Wear postop shoe  Be well, Dr. Ardelia Mems

## 2015-09-06 ENCOUNTER — Encounter: Payer: Self-pay | Admitting: Family Medicine

## 2015-09-11 ENCOUNTER — Inpatient Hospital Stay (HOSPITAL_COMMUNITY)
Admission: AD | Admit: 2015-09-11 | Discharge: 2015-09-14 | DRG: 309 | Disposition: A | Discharge status: Home / Self Care | Payer: 59 | Source: Ambulatory Visit | Attending: Internal Medicine | Admitting: Internal Medicine

## 2015-09-11 ENCOUNTER — Ambulatory Visit (INDEPENDENT_AMBULATORY_CARE_PROVIDER_SITE_OTHER): Payer: 59 | Admitting: Pharmacist

## 2015-09-11 ENCOUNTER — Encounter (HOSPITAL_COMMUNITY): Payer: Self-pay | Admitting: Nurse Practitioner

## 2015-09-11 ENCOUNTER — Telehealth: Payer: Self-pay | Admitting: Family Medicine

## 2015-09-11 VITALS — Wt 332.5 lb

## 2015-09-11 DIAGNOSIS — M109 Gout, unspecified: Secondary | ICD-10-CM | POA: Insufficient documentation

## 2015-09-11 DIAGNOSIS — G4733 Obstructive sleep apnea (adult) (pediatric): Secondary | ICD-10-CM | POA: Diagnosis present

## 2015-09-11 DIAGNOSIS — E876 Hypokalemia: Secondary | ICD-10-CM | POA: Diagnosis present

## 2015-09-11 DIAGNOSIS — I4891 Unspecified atrial fibrillation: Secondary | ICD-10-CM | POA: Diagnosis not present

## 2015-09-11 DIAGNOSIS — I10 Essential (primary) hypertension: Secondary | ICD-10-CM | POA: Diagnosis present

## 2015-09-11 DIAGNOSIS — M10072 Idiopathic gout, left ankle and foot: Secondary | ICD-10-CM | POA: Diagnosis not present

## 2015-09-11 DIAGNOSIS — Z6841 Body Mass Index (BMI) 40.0 and over, adult: Secondary | ICD-10-CM

## 2015-09-11 DIAGNOSIS — I480 Paroxysmal atrial fibrillation: Principal | ICD-10-CM | POA: Diagnosis present

## 2015-09-11 LAB — MAGNESIUM: MAGNESIUM: 2.1 mg/dL (ref 1.7–2.4)

## 2015-09-11 LAB — BASIC METABOLIC PANEL
Anion gap: 11 (ref 5–15)
BUN: 13 mg/dL (ref 6–20)
CALCIUM: 9.6 mg/dL (ref 8.9–10.3)
CHLORIDE: 102 mmol/L (ref 101–111)
CO2: 28 mmol/L (ref 22–32)
CREATININE: 0.94 mg/dL (ref 0.61–1.24)
GFR calc non Af Amer: >60 mL/min (ref >60)
Glucose, Bld: 105 mg/dL — ABNORMAL HIGH (ref 65–99)
Potassium: 4.1 mmol/L (ref 3.5–5.1)
SODIUM: 141 mmol/L (ref 135–145)

## 2015-09-11 LAB — URIC ACID: URIC ACID, SERUM: 7.2 mg/dL (ref 4.4–7.6)

## 2015-09-11 MED ORDER — SODIUM CHLORIDE 0.9% FLUSH
3.0000 mL | Freq: Two times a day (BID) | INTRAVENOUS | Status: DC
  Administered 2015-09-11 – 2015-09-14 (×5): 3 mL via INTRAVENOUS

## 2015-09-11 MED ORDER — DOFETILIDE 500 MCG PO CAPS
500.0000 ug | ORAL_CAPSULE | Freq: Two times a day (BID) | ORAL | Status: DC
  Administered 2015-09-11 – 2015-09-14 (×6): 500 ug via ORAL
  Filled 2015-09-11 (×6): qty 1

## 2015-09-11 MED ORDER — APIXABAN 5 MG PO TABS
5.0000 mg | ORAL_TABLET | Freq: Two times a day (BID) | ORAL | Status: DC
  Administered 2015-09-11 – 2015-09-14 (×6): 5 mg via ORAL
  Filled 2015-09-11 (×6): qty 1

## 2015-09-11 MED ORDER — SODIUM CHLORIDE 0.9 % IV SOLN: 250.0000 mL | INTRAVENOUS | Status: DC | PRN

## 2015-09-11 MED ORDER — DOFETILIDE 500 MCG PO CAPS: 500.0000 ug | ORAL_CAPSULE | Freq: Two times a day (BID) | ORAL | Status: DC

## 2015-09-11 MED ORDER — SODIUM CHLORIDE 0.9% FLUSH: 3.0000 mL | INTRAVENOUS | Status: DC | PRN

## 2015-09-11 MED ORDER — CYCLOBENZAPRINE HCL 10 MG PO TABS: 5.0000 mg | ORAL_TABLET | Freq: Three times a day (TID) | ORAL | Status: DC | PRN

## 2015-09-11 MED ORDER — IBUPROFEN 600 MG PO TABS: 600.0000 mg | ORAL_TABLET | Freq: Three times a day (TID) | ORAL | Status: DC | PRN

## 2015-09-11 MED ORDER — PANTOPRAZOLE SODIUM 40 MG PO TBEC
40.0000 mg | DELAYED_RELEASE_TABLET | Freq: Every day | ORAL | Status: DC
  Administered 2015-09-12 – 2015-09-14 (×3): 40 mg via ORAL
  Filled 2015-09-11 (×3): qty 1

## 2015-09-11 MED ORDER — PREDNISONE 20 MG PO TABS: 60.0000 mg | ORAL_TABLET | Freq: Every day | ORAL | Status: DC

## 2015-09-11 MED ORDER — COLCHICINE 0.6 MG PO TABS: 0.6000 mg | ORAL_TABLET | Freq: Two times a day (BID) | ORAL | Status: DC

## 2015-09-11 MED ORDER — PREDNISONE 20 MG PO TABS
60.0000 mg | ORAL_TABLET | Freq: Every day | ORAL | Status: DC
  Administered 2015-09-11 – 2015-09-14 (×4): 60 mg via ORAL
  Filled 2015-09-11 (×5): qty 3

## 2015-09-11 NOTE — Progress Notes (Signed)
Patient ID: [name] DOB: 08/18/1965, 50 yo MRN: [id number]     HPI: [name] is a 50 yo male patient of Dr. Acie Fredrickson, also seen by Roderic Palau in [place] clinic, who presents for Tikosyn initiation. PMH is significant for atrial fibrillation 7/15 after presenting with tachypalpitations. In retrospect he has had symptoms of afib for about 5 years. He would typically take 4-5 baby aspirins and it would resolve. He was placed on eliquis and followed by Dr Acie Fredrickson. He was noncompliant with follow-up. He reports increase in episodes of afib about once twice a week now can last up to 12 hours. 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 He was seen by Dr. Rayann Heman in September 2016 and his recommendation was to try Multaq and he failed, then try Tikosyn. Due to his weight, he felt he would not be a good candidate for ablation. Pt saw Doristine Devoid in afib clinic on 08/21/15 and reported increased afib burden. It was decided at that time to pursue Tikosyn initiation.  Patient presents today to clinic with his wife. We reviewed potential side effects of Tikosyn including QTc prolongation.He is aware of the importance of not missing doses and will call the office if he misses more than 2 doses in a row. He is aware to inform all of his other providers about Tikosyn and to call the office if there is ever a question about taking a new medication. Reviewed patient's medication list - he is not on any QTc prolonging drugs. He stopped taking his Multaq 3 days ago as advised - appropriate washout period before Tikosyn initiation. He is anticoagulated on Eliquis and reports that he has not missed a dose in the last 30 days.  EKG reviewed by Dr. Rayann Heman. Normal sinus rhythm. Vent rate 60 bpm, QTc 365msec.  Labs: K 4.1, Mg 2.1, SCr 0.94, Wt 332 lbs, CrCl>100 mL/min   Past Medical History  Diagnosis Date  . Arthritis   . Chest pain 09/12/10   Myoview-attenuation defiect in the inferior myocardium, no ischemia, low risk scan  . History of MRI of cervical spine 09/25/10    normal; left shoulder normal as well  . Gout   . Chronic knee pain   . Chronic foot pain   . Hypertension   . Chronic elbow pain   . Chronic shoulder pain   . Chronic hip pain   . OSA on CPAP   . GERD (gastroesophageal reflux disease)   . Migraine     "about monthly" (03/03/2015)  . Chronic lower back pain   . Morbid obesity ([place])   . Paroxysmal atrial fibrillation ([place]) 7/15    chads2vasc score is 1   Current Outpatient Prescriptions on File Prior to Visit  Medication Sig Dispense Refill  . aspirin-acetaminophen-caffeine (EXCEDRIN MIGRAINE) 250-250-65 MG per tablet Take 2 tablets by mouth every 6 (six) hours as needed for headache.    . cyclobenzaprine (FLEXERIL) 5 MG tablet Take 1 tablet (5 mg total) by mouth 3 (three) times daily as needed for muscle spasms. 30 tablet 0  . dronedarone (MULTAQ) 400 MG tablet Take 1 tablet (400 mg total) by mouth 2 (two) times daily with a meal. 60 tablet 11  . ELIQUIS 5 MG TABS tablet TAKE ONE TABLET BY MOUTH TWICE DAILY 60 tablet 5  . ibuprofen (ADVIL,MOTRIN) 600 MG tablet Take 1 tablet (600 mg total) by mouth every 8 (eight) hours as needed for moderate pain.  30 tablet 0  . MITIGARE 0.6 MG CAPS Take 1 tablet by mouth 2 (two) times daily as needed (gout). 30 capsule 1  . Omega-3 Fatty Acids (FISH OIL) 1000 MG CAPS Take 2,000 mg by mouth daily.     Marland Kitchen omeprazole (PRILOSEC) 20 MG capsule TAKE ONE CAPSULE BY MOUTH TWICE DAILY 60 capsule 1  . predniSONE (DELTASONE) 20 MG tablet Take 3 tablets (60 mg total) by mouth daily with breakfast. 15 tablet 0   No current facility-administered medications on file prior to visit.   Allergies  Allergen Reactions  . Hydrochlorothiazide-Propranolol [Propranolol-Hctz] Other (See Comments)    Joints lock up  . Penicillins Other (See Comments)    Makes joints stiff and unable to move     Assessment/Plan:  1. Atrial fibrillation - EKG reviewed by Dr. Rayann Heman and ok for admit. Electrolytes acceptable with K >4 and Mg > 2. CrCl  >185mL/min, anticipated starting dose of Tikosyn is  500 mcg BID.  Pt aware to report to admitting.   [name], PharmD, [place] [id number] [place], [place] [address] Phone: [phone]; Fax: [phone] 09/11/2015 10:36 AM

## 2015-09-11 NOTE — Telephone Encounter (Signed)
Pt called because his foot has only gotten worse and now they are admitting him to the hospital for about 3 days. He just wanted the doctor to be aware of this. jw

## 2015-09-11 NOTE — Consult Note (Signed)
Family Medicine Teaching Service Consult Note Intern Pager: [phone]  Patient name: [name] Medical record number: [id number] Date of birth: 07-Aug-1965 Age: 50 y.o. Gender: male  Primary Care Provider: Chrisandra Netters, MD Primary Team: Cardiology Code Status: FULL  Assessment and Plan: 50yo male with history of atrial fibrillation, obesity, hypertension, OSA. Admitted for treatment of Atrial Fibrillation, Cariology primary. Consulted family medicine team for treatment of gout.  # Gout: History of prior. Significantly improved on Prednisone, however course was finished before resolution of pain and again worsened off of prednisone. Denies fevers and septic arthritis unlikely given improvement with prednisone. Xray left foot with swelling, but no signs of fracture. - Uric Acid - Prednisone 50mg  daily. Will continue until improvement noted and then taper slowly instead of discontinuing abruptly. - Ibuprofen PRN pain  # Paroxysmal Atrial Fibrillation: First diagnosed in 2015. Echocardiogram in 03/2014 with EF 0000000, grade 2 diastolic dysfunction. Failed medical therapy with Metoprolol and Multaq. Admitted for Tikosyn. - Follow up repeat Echocardiogram - Tikosyn per Cardiology - Continue Eliquis  # GERD: Continue Home Protonix # HTN: Not currently on medications. Stable. Continue to monitor.  FEN/GI: Heart Healthy Diet PPx: Eliquis  Disposition: Per Cardiology  Subjective:  Consulted by Cardiology for management of gout. Reports redness, swelling, and pain in left MTP joint. Denies fevers. Was recently seen in [facility] clinic for same complaint on 08/30/15 and 09/04/15. Colchicine was not continued since it was greater than 72hours at presentation and did not help with pain. Completed course of prednisone, which he completed on 09/09/15. States pain and swelling significantly improved on Prednisone and he was able to ambulate, however it had not completely resolved when Prednisone course was  completed. Pain and swelling acutely worsened on 09/10/15 and he now walks with limp. Currently admitted by Cardiology for treatment of Atrial Fibrillation and states they suspect he will be hospitalized for approximately 3 days.  No further complaints or concerns at this time.  Objective: Temp:  [98 F (36.7 C)] 98 F (36.7 C) (02/06 1538) Pulse Rate:  [90] 90 (02/06 1538) Resp:  [18] 18 (02/06 1538) BP: (136)/(83) 136/83 mmHg (02/06 1538) SpO2:  [96 %] 96 % (02/06 1538) Weight:  [329 lb 12.8 oz (149.596 kg)-332 lb 8 oz (150.821 kg)] 329 lb 12.8 oz (149.596 kg) (02/06 1538) Physical Exam: General: 50yo male resting comfortably in no apparent distress Cardiovascular: S1 and S2 noted, RRR Respiratory: Clear to auscultation bilaterally, no wheezes Abdomen: Soft and nondistended, nontender Extremities: Increased redness, swelling, and tenderness of left 1st MTP joint.  Laboratory: No results for input(s): WBC, HGB, HCT, PLT in the last 168 hours.  Recent Labs Lab 09/11/15 1050  NA 141  K 4.1  CL 102  CO2 28  BUN 13  CREATININE 0.94  CALCIUM 9.6  GLUCOSE 105*   Imaging/Diagnostic Tests: Dg Foot Complete Left  09/04/2015  CLINICAL DATA:  Acute idiopathic gout of the LEFT foot, pain and swelling great toe for 10 days, not responding to steroids, no injury EXAM: LEFT FOOT - COMPLETE 3+ VIEW COMPARISON:  08/07/2006 FINDINGS: Diffuse soft tissue swelling LEFT foot. Osseous mineralization grossly normal. Joint space narrowing first MTP joint with soft tissue swelling focally at medial and minimally at dorsal aspects. No acute fracture, dislocation or bone destruction. No definite juxta-articular erosions identified. Remaining joint spaces preserved. IMPRESSION: Diffuse soft tissue swelling. Joint space narrowing and focal soft tissue swelling at LEFT first MTP joint without juxta-articular erosions. Electronically Signed   By: Elta Guadeloupe  Thornton Papas M.D.   On: 09/04/2015 14:18    Lorna Few,  DO 09/11/2015, 5:43 PM PGY-2, [place] Intern pager: [phone], text pages welcome

## 2015-09-11 NOTE — Progress Notes (Signed)
Pharmacy Review for Dofetilide (Tikosyn) Initiation  Admit Complaint: 50 y.o. male admitted 09/11/2015 with atrial fibrillation to be initiated on dofetilide.   Assessment:  Patient Exclusion Criteria: If any screening criteria checked as "Yes", then  patient  should NOT receive dofetilide until criteria item is corrected. If "Yes" please indicate correction plan.  YES  NO Patient  Exclusion Criteria Correction Plan  []  [x]  Baseline QTc interval is greater than or equal to 440 msec. IF above YES box checked dofetilide contraindicated unless patient has ICD; then may proceed if QTc 500-550 msec or with known ventricular conduction abnormalities may proceed with QTc 550-600 msec. QTc = 390   []  [x]  Magnesium level is less than 1.8 mEq/l : Last magnesium:  Lab Results  Component Value Date   MG 2.1 09/11/2015         []  [x]  Potassium level is less than 4 mEq/l : Last potassium:  Lab Results  Component Value Date   K 4.1 09/11/2015         []  [x]  Patient is known or suspected to have a digoxin level greater than 2 ng/ml: No results found for: DIGOXIN    []  [x]  Creatinine clearance less than 20 ml/min (calculated using Cockcroft-Gault, actual body weight and serum creatinine): Estimated Creatinine Clearance: 143.6 mL/min (by C-G formula based on Cr of 0.94).    []  [x]  Patient has received drugs known to prolong the QT intervals within the last 48 hours (phenothiazines, tricyclics or tetracyclic antidepressants, erythromycin, H-1 antihistamines, cisapride, fluoroquinolones, azithromycin). Drugs not listed above may have an, as yet, undetected potential to prolong the QT interval, updated information on QT prolonging agents is available at this website:QT prolonging agents   []  [x]  Patient received a dose of hydrochlorothiazide (Oretic) alone or in any combination including triamterene (Dyazide, Maxzide) in the last 48 hours.   []  [x]  Patient received a medication known to increase  dofetilide plasma concentrations prior to initial dofetilide dose:  . Trimethoprim (Primsol, Proloprim) in the last 36 hours . Verapamil (Calan, Verelan) in the last 36 hours or a sustained release dose in the last 72 hours . Megestrol (Megace) in the last 5 days  . Cimetidine (Tagamet) in the last 6 hours . Ketoconazole (Nizoral) in the last 24 hours . Itraconazole (Sporanox) in the last 48 hours  . Prochlorperazine (Compazine) in the last 36 hours    []  [x]  Patient is known to have a history of torsades de pointes; congenital or acquired long QT syndromes.   []  [x]  Patient has received a Class 1 antiarrhythmic with less than 2 half-lives since last dose. (Disopyramide, Quinidine, Procainamide, Lidocaine, Mexiletine, Flecainide, Propafenone)   []  [x]  Patient has received amiodarone therapy in the past 3 months or amiodarone level is greater than 0.3 ng/ml.   Dronaderone has been stopped x 3 days  Patient has been appropriately anticoagulated with Apixaban.  Ordering provider was confirmed at [email or web address] if they are not listed on the [place] Prescribers list.  Goal of Therapy: Follow renal function, electrolytes, potential drug interactions, and dose adjustment. Provide education and 1 week supply at discharge.  Plan:  [x]   Physician selected initial dose within range recommended for patients level of renal function - will monitor for response.  []   Physician selected initial dose outside of range recommended for patients level of renal function - will discuss if the dose should be altered at this time.   [facility] Calculated CrCl  Dose q12h  [x]  >  60 ml/min 500 mcg  []  40-60 ml/min 250 mcg  []  20-40 ml/min 125 mcg   2. Follow up QTc after the first 5 doses, renal function, electrolytes (K & Mg) daily x 3     days, dose adjustment, success of initiation and facilitate 1 week discharge supply as     clinically indicated.  3. Initiate Tikosyn education video (Call  [phone] and ask for video # 116).  4. Place Enrollment Form on the chart for discharge supply of dofetilide.  Levester Fresh, PharmD, BCPS, [facility] Clinical Pharmacist Pager [phone] 09/11/2015 4:27 PM

## 2015-09-11 NOTE — H&P (Signed)
ELECTROPHYSIOLOGY HISTORY AND PHYSICAL    Patient ID: [name] MRN: [id number], DOB/AGE: Oct 21, 1965 50 y.o.  Admit date: 09/11/2015 Date of Consult: 09/11/2015  Primary Physician: Chrisandra Netters, MD Primary Cardiologist: Nahser Electrophysiologist: Allred  CC: here for Tikosyn admission   HPI:  [name] is a 50 y.o. male with a past medical history significant for morbid obesity, hypertension, OSA on CPAP, and paroxysmal atrial fibrillation.  He was first diagnosed with atrial fibrillation in 2015 when he presented to the ER with palpitations.  He has failed medical therapy with metoprolol and multaq. He was seen in the AF clinic and risks, benefits to Tikosyn were reviewed with the patient who wished to proceed.   He currently denies chest pain, shortness of breath, recent fevers, chills, nausea or vomiting. He has not had dizziness or syncope.  He has episodes of atrial fibrillation monthly that last for 1-2 days.  They are associated with shortness of breath and fatigue. He has a presumed gout flare up on left foot that responded partially to prednisone but is not resolved.   Echo 03/2014 demonstrated EF 0000000, grade 2 diastolic dysfunction, LA 43.   He reports compliance with Eliquis for the last 4 weeks.   Past Medical History  Diagnosis Date  . Arthritis   . 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 History of MRI of cervical spine 09/25/10    normal; left shoulder normal as well  . Gout   . Hypertension   . OSA on CPAP   . GERD (gastroesophageal reflux disease)   . Migraine     "about monthly" (03/03/2015)  . Morbid obesity ([place])   . Paroxysmal atrial fibrillation ([place]) 7/15    chads2vasc score is 1     Surgical History:  Past Surgical History  Procedure Laterality Date  . Knee arthroscopy Right 02/2013  . Cardiac catheterization  05/2005    "no blockage"     Prescriptions prior to admission    Medication Sig Dispense Refill Last Dose  . aspirin-acetaminophen-caffeine (EXCEDRIN MIGRAINE) 250-250-65 MG per tablet Take 2 tablets by mouth every 6 (six) hours as needed for headache.   Taking  . cyclobenzaprine (FLEXERIL) 5 MG tablet Take 1 tablet (5 mg total) by mouth 3 (three) times daily as needed for muscle spasms. 30 tablet 0 Taking  . dronedarone (MULTAQ) 400 MG tablet Take 1 tablet (400 mg total) by mouth 2 (two) times daily with a meal. 60 tablet 11 Taking  . ELIQUIS 5 MG TABS tablet TAKE ONE TABLET BY MOUTH TWICE DAILY 60 tablet 5 Taking  . ibuprofen (ADVIL,MOTRIN) 600 MG tablet Take 1 tablet (600 mg total) by mouth every 8 (eight) hours as needed for moderate pain. 30 tablet 0 Taking  . MITIGARE 0.6 MG CAPS Take 1 tablet by mouth 2 (two) times daily as needed (gout). 30 capsule 1 Taking  . Omega-3 Fatty Acids (FISH OIL) 1000 MG CAPS Take 2,000 mg by mouth daily.    Taking  . omeprazole (PRILOSEC) 20 MG capsule TAKE ONE CAPSULE BY MOUTH TWICE DAILY 60 capsule 1   . predniSONE (DELTASONE) 20 MG tablet Take 3 tablets (60 mg total) by mouth daily with breakfast. 15 tablet 0     Inpatient Medications:   Allergies:  Allergies  Allergen Reactions  . Hydrochlorothiazide-Propranolol [Propranolol-Hctz] Other (See Comments)    Joints lock up  . Penicillins Other (See Comments)  Makes joints stiff and unable to move    Social History   Social History  . Marital Status: Married    Spouse Name: N/A  . Number of Children: N/A  . Years of Education: N/A   Occupational History  . Not on file.   Social History Main Topics  . Smoking status: Former Smoker -- 1.00 packs/day for 10 years    Types: Cigarettes    Quit date: 04/02/1994  . Smokeless tobacco: Former Systems developer    Types: Chew     Comment: "chewed in my teens"  . Alcohol Use: 0.0 oz/week    0 Standard drinks or equivalent per week     Comment: "stopped in 1994"  . Drug Use: Yes    Special: Marijuana     Comment:  "stopped marijuna in 1994"  . Sexual Activity: Yes   Other Topics Concern  . Not on file   Social History Narrative   Lives in [place] with spouse.  3 children, all grown.   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 Atrial fibrillation Mother 1    had a stroke     Review of Systems: All other systems reviewed and are otherwise negative except as noted above.  Physical Exam: Filed Vitals:   09/11/15 1538  BP: 136/83  Pulse: 90  Temp: 98 F (36.7 C)  TempSrc: Oral  Resp: 18  Height: 6\' 2"  (1.88 m)  Weight: 329 lb 12.8 oz (149.596 kg)  SpO2: 96%    GEN- The patient is morbidly obese appearing, alert and oriented x 3 today.   HEENT: normocephalic, atraumatic; sclera clear, conjunctiva pink; hearing intact; oropharynx clear; neck supple  Lungs- Clear to ausculation bilaterally, normal work of breathing.  No wheezes, rales, rhonchi Heart- Regular rate and rhythm, no murmurs, rubs or gallops  GI- obese, non-tender, non-distended, bowel sounds present  Extremities- no clubbing, cyanosis, or edema; DP/PT/radial pulses 2+ bilaterally, sock on left foot with tenderness to palpation MS- no significant deformity or atrophy Skin- warm and dry, no rash or lesion Psych- euthymic mood, full affect Neuro- strength and sensation are intact  Labs:   Lab Results  Component Value Date   WBC 6.4 06/23/2015   HGB 14.7 06/23/2015   HCT 42.5 06/23/2015   MCV 82.4 06/23/2015   PLT 230 06/23/2015     Recent Labs Lab 09/11/15 1050  NA 141  K 4.1  CL 102  CO2 28  BUN 13  CREATININE 0.94  CALCIUM 9.6  GLUCOSE 105*    [id number] rhythm, rate 60, QTc 352msec   Assessment/Plan: 1.  Paroxysmal atrial fibrillation The patient has paroxysmal atrial fibrillation and has failed Metoprolol as well as Multaq. He presents today for Tikosyn initiation. Labs and QTc stable for initiation. Will start Tikosyn  554mcg twice daily today Continue Eliquis for CHADS2VASC of 1 Without lifestyle changes, I am concerned about our ability to maintain SR long term Referred to AF nutrition program Update echo this admission  With weight, he is not a candidate for ablation at this time - discussed with patient  2.  Morbid obesity Weight loss encouraged Without weight loss, long term prognosis is poor He and his spouse seem motivated to make lifestyle changes, they are willing to attend AF nutrition program, will provide information at discharge  3.  HTN Stable No change required today  4.  OSA Compliance with CPAP encouraged   5.  Left foot pain/?gout He has been treated with prednisone with some relief  Will ask [facility] to evaluate while here as they know him well.    Signed, Chanetta Marshall, NP 09/11/2015 3:52 PM  Have interviewed and examined the patient Morbidly obese gentleman with recurrent atrial fibrillation. Last dose of dronaderone was 5 days ago. QTC today is 390 ms. We'll begin dofetilide.  We have also discussed the importance of weight loss. He and his wife struggled. We discussed a variety of strategies including a low glycemic index diet

## 2015-09-12 ENCOUNTER — Encounter (HOSPITAL_COMMUNITY): Payer: Self-pay | Admitting: *Deleted

## 2015-09-12 LAB — BASIC METABOLIC PANEL
Anion gap: 11 (ref 5–15)
BUN: 11 mg/dL (ref 6–20)
CALCIUM: 9.4 mg/dL (ref 8.9–10.3)
CO2: 23 mmol/L (ref 22–32)
CREATININE: 0.84 mg/dL (ref 0.61–1.24)
Chloride: 103 mmol/L (ref 101–111)
GFR calc non Af Amer: >60 mL/min (ref >60)
Glucose, Bld: 182 mg/dL — ABNORMAL HIGH (ref 65–99)
Potassium: 4.2 mmol/L (ref 3.5–5.1)
Sodium: 137 mmol/L (ref 135–145)

## 2015-09-12 LAB — MAGNESIUM: MAGNESIUM: 2.2 mg/dL (ref 1.7–2.4)

## 2015-09-12 MED ORDER — OXYMETAZOLINE HCL 0.05 % NA SOLN
1.0000 | Freq: Two times a day (BID) | NASAL | Status: DC
  Administered 2015-09-12 – 2015-09-13 (×2): 1 via NASAL
  Filled 2015-09-12: qty 15

## 2015-09-12 NOTE — Plan of Care (Signed)
Problem: Cardiac: Goal: Ability to achieve and maintain adequate cardiopulmonary perfusion will improve Outcome: Progressing Pt currently taking eliquis and has been admitted for a Tikosyn load. Pt currently on heart monitor to make sure pt doesn't have any episodes of A Fib

## 2015-09-12 NOTE — Consult Note (Signed)
Family Medicine Teaching Service Consult Note Intern Pager: [phone]  Patient name: [name] Medical record number: [id number] Date of birth: Nov 22, 1965 Age: 50 y.o. Gender: male  Primary Care Provider: Chrisandra Netters, MD Primary Team: Cardiology Code Status: FULL  Assessment and Plan: 50yo male with history of atrial fibrillation, obesity, hypertension, OSA. Admitted for treatment of Atrial Fibrillation, Cariology primary. Consulted family medicine team for treatment of gout.  # Gout: Acute flair up. Denies fevers and septic arthritis unlikely given improvement with prednisone. Xray left foot with swelling, but no signs of fracture. Has tried Allopurinol in the past at it has helped to prevent gout. Has a flair ~q 4 months. Uric Acid; 7.2 (normal).  - Prednisone 60mg  daily. Will continue until improvement noted and then taper slowly instead of discontinuing abruptly. - Ibuprofen 600 mg PRN pain - Will begin Allopurinol in 2-4 weeks, will need to go to PCP's office  # Paroxysmal Atrial Fibrillation: First diagnosed in 2015. Echocardiogram in 03/2014 with EF 0000000, grade 2 diastolic dysfunction. Failed medical therapy with Metoprolol and Multaq. Admitted for Tikosyn. - Follow up repeat Echocardiogram (pending) - Tikosyn per Cardiology - Continue Eliquis  # GERD: Continue Home Protonix  # HTN: Not currently on medications. Stable. Continue to monitor.  FEN/GI: Heart Healthy Diet PPx: Eliquis  Disposition: Per Cardiology  Subjective:  S/p completed course of prednisone on 09/09/15 for gout on left big toe. Pain and swelling acutely worsened on 09/10/15. FMTS consulted yesterday and pt was started on 60 mg Prednisone daily. Erythema, swelling, and tenderness have improved. Denies fevers or other joint tenderness. Discussed with patient the likelihood of beginning Allopurinol for better gout control. Has tried Allopurinol in the past but thought it was making him dizzy so he stopped; was  also taking Amlodipine at that time.   Objective: Temp:  [98 F (36.7 C)-98.5 F (36.9 C)] 98 F (36.7 C) (02/07 0500) Pulse Rate:  [72-90] 80 (02/07 0500) Resp:  [18-20] 20 (02/07 0500) BP: (134-136)/(67-83) 135/81 mmHg (02/07 0500) SpO2:  [91 %-96 %] 91 % (02/07 0500) Weight:  [327 lb 11.2 oz (148.644 kg)-332 lb 8 oz (150.821 kg)] 327 lb 11.2 oz (148.644 kg) (02/07 0500) Physical Exam: General: 50yo male resting comfortably in no apparent distress Cardiovascular: S1 and S2 noted, RRR Respiratory: Clear to auscultation bilaterally, no wheezes Abdomen: Soft and nondistended, nontender Extremities: Improved erythema, swelling, and tenderness of left 1st MTP joint.  Laboratory: No results for input(s): WBC, HGB, HCT, PLT in the last 168 hours.  Recent Labs Lab 09/11/15 1050 09/12/15 0530  NA 141 137  K 4.1 4.2  CL 102 103  CO2 28 23  BUN 13 11  CREATININE 0.94 0.84  CALCIUM 9.6 9.4  GLUCOSE 105* 182*   Imaging/Diagnostic Tests: Dg Foot Complete Left  09/04/2015  CLINICAL DATA:  Acute idiopathic gout of the LEFT foot, pain and swelling great toe for 10 days, not responding to steroids, no injury EXAM: LEFT FOOT - COMPLETE 3+ VIEW COMPARISON:  08/07/2006 FINDINGS: Diffuse soft tissue swelling LEFT foot. Osseous mineralization grossly normal. Joint space narrowing first MTP joint with soft tissue swelling focally at medial and minimally at dorsal aspects. No acute fracture, dislocation or bone destruction. No definite juxta-articular erosions identified. Remaining joint spaces preserved. IMPRESSION: Diffuse soft tissue swelling. Joint space narrowing and focal soft tissue swelling at LEFT first MTP joint without juxta-articular erosions. Electronically Signed   By: Lavonia Dana M.D.   On: 09/04/2015 14:18  Carlyle Dolly, MD 09/12/2015, 8:16 AM PGY-1, [place] Intern pager: [phone], text pages welcome

## 2015-09-12 NOTE — Progress Notes (Signed)
Utilization review completed. Labrian Torregrossa, RN, BSN. 

## 2015-09-12 NOTE — Telephone Encounter (Signed)
Noted. It appears the FPTS has been asked to see patient for his foot while in the hospital. [name], MD

## 2015-09-12 NOTE — Plan of Care (Signed)
Problem: Activity: Goal: Risk for activity intolerance will decrease Outcome: Completed/Met Date Met:  09/12/15 Pt is independent and encouraged to walk frequently.

## 2015-09-12 NOTE — Plan of Care (Signed)
Problem: Tissue Perfusion: Goal: Risk factors for ineffective tissue perfusion will decrease Outcome: Completed/Met Date Met:  09/12/15 Pt currently taking eliquis

## 2015-09-12 NOTE — Care Management Note (Addendum)
Case Management Note  Patient Details  Name: [name] MRN: [id number] Date of Birth: 03-31-1966  Subjective/Objective:  Pt admitted for Tikosyn Load. Pt is from home with support of family. Pt has [employer] for insurance.  Benefits check in process for co pay amount. CM will provide co pay to pt once completed. Pt uses [facility] on [address]- and pharmacy not able to order the medication. Pt asked me to check [facility] on [address] and medication is available and can be ordered.             Action/Plan:CM will assist with the 7 day Rx that will be filled via [facility]. Pt will need additional Rx with refills as well. No further needs from CM at this time.   Expected Discharge Date:                  Expected Discharge Plan:  Home/Self Care  In-House Referral:  NA  Discharge planning Services  CM Consult, Medication Assistance  Post Acute Care Choice:  NA Choice offered to:  NA  DME Arranged:  N/A DME Agency:  NA  HH Arranged:  NA HH Agency:  NA  Status of Service:  Completed, signed off  Medicare Important Message Given:    Date Medicare IM Given:    Medicare IM give by:    Date Additional Medicare IM Given:    Additional Medicare Important Message give by:     If discussed at [place] of Stay Meetings, dates discussed:    Additional Comments:  1430 09-13-15 Jacqlyn Krauss, [place] [phone]  S/W ROBERT @ [facility] # [phone]   TIKOSYN CAPSULE 500 MCG BID 30 DAY SUPPLY   COVER- YES  CO-PAY - $ 50.00  TIER- 3 DRUG  PRIOR APPROVAL- NO   DOFETILIDE 500 MCG BID   COVER- YES  CO-PAY- $ 30.00  TIER- 2 DRUG  PRIOR APPROVAL - NO  [facility]Y : [facility]  [facility]   MAIL ORDER: ALSO CHEAPER   Bethena Roys, RN 09/12/2015, 1:52 PM

## 2015-09-12 NOTE — Plan of Care (Signed)
Problem: Safety: Goal: Ability to remain free from injury will improve Outcome: Completed/Met Date Met:  09/12/15 Pt is independent and knows that if he needs assistance to call and ask for help.

## 2015-09-12 NOTE — Progress Notes (Signed)
    SUBJECTIVE: The patient is doing well today.  At this time, he denies chest pain, shortness of breath, or any new concerns.  CURRENT MEDICATIONS: . apixaban  5 mg Oral BID  . dofetilide  500 mcg Oral BID  . pantoprazole  40 mg Oral Daily  . predniSONE  60 mg Oral Q breakfast  . sodium chloride flush  3 mL Intravenous Q12H      OBJECTIVE: Physical Exam: Filed Vitals:   09/11/15 1538 09/11/15 2154 09/12/15 0500  BP: 136/83 134/67 135/81  Pulse: 90 72 80  Temp: 98 F (36.7 C) 98.5 F (36.9 C) 98 F (36.7 C)  TempSrc: Oral Oral Oral  Resp: 18  20  Height: 6\' 2"  (1.88 m)    Weight: 329 lb 12.8 oz (149.596 kg)  327 lb 11.2 oz (148.644 kg)  SpO2: 96% 95% 91%   No intake or output data in the 24 hours ending 09/12/15 0923  Telemetry reveals sinus rhythm  GEN- The patient is morbidly obese appearing, alert and oriented x 3 today.   Head- normocephalic, atraumatic Eyes-  Sclera clear, conjunctiva pink Ears- hearing intact Oropharynx- clear Neck- supple  Lungs- Clear to ausculation bilaterally, normal work of breathing Heart- Regular rate and rhythm, no murmurs, rubs or gallops  GI- soft, NT, ND, + BS Extremities- no clubbing, cyanosis, or edema Skin- no rash or lesion Psych- euthymic mood, full affect Neuro- strength and sensation are intact  LABS: Basic Metabolic Panel:  Recent Labs  09/11/15 1050 09/12/15 0530  NA 141 137  K 4.1 4.2  CL 102 103  CO2 28 23  GLUCOSE 105* 182*  BUN 13 11  CREATININE 0.94 0.84  CALCIUM 9.6 9.4  MG 2.1 2.2    ASSESSMENT AND PLAN:  Active Problems:   Paroxysmal atrial fibrillation (HCC)   Acute gout    1. Paroxysmal atrial fibrillation The patient has paroxysmal atrial fibrillation and has failed Metoprolol as well as Multaq. He presented for Tikosyn initiation.  Labs and QTc stable   Continue Tikosyn 594mcg twice daily  Continue Eliquis for CHADS2VASC of 1 Without lifestyle changes, I am concerned about our ability  to maintain SR long term Update echo this admission  With weight, he is not a candidate for ablation at this time - discussed with patient  2. Morbid obesity Weight loss encouraged Without weight loss, long term prognosis is poor He and his spouse seem motivated to make lifestyle changes, they are willing to attend AF nutrition program, will provide information at discharge  3. HTN Stable No change required today  4. OSA Compliance with CPAP encouraged   5. Left foot gout Management per Family Medicine  Chanetta Marshall, NP 09/12/2015 9:24 AM   I have seen, examined the patient, and reviewed the above assessment and plan.  On exam, RRR.  QT ist stable.  Changes to above are made where necessary.    Co Sign: Thompson Grayer, MD 09/12/2015 11:00 AM

## 2015-09-13 ENCOUNTER — Ambulatory Visit (HOSPITAL_COMMUNITY): Payer: 59

## 2015-09-13 DIAGNOSIS — M10072 Idiopathic gout, left ankle and foot: Secondary | ICD-10-CM

## 2015-09-13 DIAGNOSIS — I4891 Unspecified atrial fibrillation: Secondary | ICD-10-CM

## 2015-09-13 LAB — BASIC METABOLIC PANEL
ANION GAP: 8 (ref 5–15)
BUN: 12 mg/dL (ref 6–20)
CO2: 26 mmol/L (ref 22–32)
Calcium: 9 mg/dL (ref 8.9–10.3)
Chloride: 107 mmol/L (ref 101–111)
Creatinine, Ser: 0.89 mg/dL (ref 0.61–1.24)
GFR calc Af Amer: >60 mL/min (ref >60)
GFR calc non Af Amer: >60 mL/min (ref >60)
GLUCOSE: 167 mg/dL — AB (ref 65–99)
POTASSIUM: 3.7 mmol/L (ref 3.5–5.1)
Sodium: 141 mmol/L (ref 135–145)

## 2015-09-13 LAB — MAGNESIUM: Magnesium: 2.2 mg/dL (ref 1.7–2.4)

## 2015-09-13 MED ORDER — SALINE SPRAY 0.65 % NA SOLN
1.0000 | NASAL | Status: DC | PRN
  Administered 2015-09-13: 1 via NASAL
  Filled 2015-09-13: qty 44

## 2015-09-13 MED ORDER — POTASSIUM CHLORIDE CRYS ER 20 MEQ PO TBCR
40.0000 meq | EXTENDED_RELEASE_TABLET | Freq: Once | ORAL | Status: AC
  Administered 2015-09-13: 40 meq via ORAL
  Filled 2015-09-13: qty 2

## 2015-09-13 NOTE — Discharge Summary (Signed)
ELECTROPHYSIOLOGY DISCHARGE SUMMARY    Patient ID: [name],  MRN: [id number], DOB/AGE: 1966-04-10 50 y.o.  Admit date: 09/11/2015 Discharge date: 09/14/2015  Primary Care Physician: Chrisandra Netters, MD Primary Cardiologist: [place] Electrophysiologist: Roberto Hlavaty  Primary Discharge Diagnosis:  1.  Paroxysmal atrial fibrillation status post Tikosyn loading this admission  Secondary Discharge Diagnosis:  1.  Morbid obesity 2.  Hypertension 3.  OSA on CPAP 4.  Gout  Allergies  Allergen Reactions  . Hydrochlorothiazide-Propranolol [Propranolol-Hctz] Other (See Comments)    Joints lock up  . Penicillins Other (See Comments)    Makes joints stiff and unable to move Has patient had a PCN reaction causing immediate rash, facial/tongue/throat swelling, SOB or lightheadedness with hypotension: No Has patient had a PCN reaction causing severe rash involving mucus membranes or skin necrosis: No Has patient had a PCN reaction that required hospitalization No Has patient had a PCN reaction occurring within the last 10 years: No If all of the above answers are "NO", then may proceed with Cephalosporin use.     Procedures This Admission:  1.  Tikosyn loading   Brief HPI: [name] is a 50 y.o. male with a past medical history of paroxysmal atrial fibrillation. He has failed medical therapy with Multaq. Risks, benefits, and alternatives to Tikosyn were reviewed with the patient who wished to proceed.    Hospital Course:  The patient was admitted and Tikosyn was initiated.  Renal function and electrolytes were followed during the hospitalization.  Their QTc remained stable. They were monitored until discharge on telemetry which demonstrated sinus rhythm.  On the day of discharge, they were examined by Dr Rayann Heman who considered them stable for discharge to home.  Follow-up has been arranged with Roderic Palau, NP in 1 week and 4 weeks and with Dr Rayann Heman in 12 weeks.   He has had recent  gout flare up and was seen by the [facility] service this admission who knows him well. Recommendations are for prednisone taper followed by initiation of allopurinol to be started as an outpatient.   Physical Exam: Filed Vitals:   09/13/15 1704 09/13/15 2031 09/14/15 0504 09/14/15 0507  BP: 135/84 135/92 122/87   Pulse: 67 66 67   Temp: 97.5 F (36.4 C) 97.7 F (36.5 C) 97.6 F (36.4 C)   TempSrc: Oral Oral Oral   Resp: 15     Height:      Weight:    324 lb 1.6 oz (147.011 kg)  SpO2: 95% 100% 97%     GEN- The patient is morbidly obese appearing, alert and oriented x 3 today.   HEENT: normocephalic, atraumatic; sclera clear, conjunctiva pink; hearing intact; oropharynx clear; neck supple  Lungs- Clear to ausculation bilaterally, normal work of breathing.  No wheezes, rales, rhonchi Heart- Regular rate and rhythm, no murmurs, rubs or gallops  GI- soft, non-tender, non-distended, bowel sounds present Extremities- no clubbing, cyanosis, or edema; DP/PT/radial pulses 2+ bilaterally MS- no significant deformity or atrophy Skin- warm and dry, no rash or lesion Psych- euthymic mood, full affect Neuro- strength and sensation are intact   Labs:   Lab Results  Component Value Date   WBC 6.4 06/23/2015   HGB 14.7 06/23/2015   HCT 42.5 06/23/2015   MCV 82.4 06/23/2015   PLT 230 06/23/2015     Recent Labs Lab 09/14/15 0711  NA 140  K 4.1  CL 103  CO2 27  BUN 15  CREATININE 0.97  CALCIUM 9.0  GLUCOSE  98     Discharge Medications:    Medication List    STOP taking these medications        dronedarone 400 MG tablet  Commonly known as:  MULTAQ      TAKE these medications        aspirin-acetaminophen-caffeine [id number] MG tablet  Commonly known as:  EXCEDRIN MIGRAINE  Take 2 tablets by mouth every 6 (six) hours as needed for headache.     cyclobenzaprine 5 MG tablet  Commonly known as:  FLEXERIL  Take 1 tablet (5 mg total) by mouth 3 (three) times daily as  needed for muscle spasms.     dofetilide 500 MCG capsule  Commonly known as:  TIKOSYN  Take 1 capsule (500 mcg total) by mouth 2 (two) times daily.     ELIQUIS 5 MG Tabs tablet  Generic drug:  apixaban  TAKE ONE TABLET BY MOUTH TWICE DAILY     Fish Oil 1000 MG Caps  Take 2,000 mg by mouth daily.     ibuprofen 200 MG tablet  Commonly known as:  ADVIL,MOTRIN  Take 800 mg by mouth every 6 (six) hours as needed (pain).     ibuprofen 600 MG tablet  Commonly known as:  ADVIL,MOTRIN  Take 1 tablet (600 mg total) by mouth every 8 (eight) hours as needed for moderate pain.     MITIGARE 0.6 MG Caps  Generic drug:  Colchicine  Take 1 tablet by mouth 2 (two) times daily as needed (gout).     omeprazole 20 MG capsule  Commonly known as:  PRILOSEC  TAKE ONE CAPSULE BY MOUTH TWICE DAILY     predniSONE 10 MG tablet  Commonly known as:  DELTASONE  Take 50mg  qd for 2 days, then 40mg  qd for 2 days, then 30mg  qd for 2 days, then 20mg  qd for 2 days, then 10mg  qd for 2 days     PRESCRIPTION MEDICATION  Inhale into the lungs at bedtime. CPAP     TYLENOL SINUS CONGESTION/PAIN 5-325 MG Tabs  Generic drug:  Phenylephrine-Acetaminophen  Take 2 tablets by mouth every 4 (four) hours as needed (sinus congestion).        Disposition:   Follow-up Information    Follow up with [facility] On 09/21/2015.   Specialty:  Cardiology   Why:  at 9:30AM   Contact information:   [id number] [place] [phone]      Follow up with [facility] On 10/11/2015.   Specialty:  Cardiology   Why:  at 9:30AM    Contact information:   [id number] [place] [phone]      Follow up with Thompson Grayer, MD On 12/18/2015.   Specialty:  Cardiology   Why:  at 12noon   Contact information:   [place] [address] [phone]        Follow up with Chrisandra Netters, MD In 1 week.   Specialty:  Family Medicine   Why:  for gout follow up   Contact information:   [place] [address] [phone]       Duration of Discharge Encounter: Greater than 30 minutes including physician time.  Signed, Chanetta Marshall, NP 09/14/2015 8:22 AM     Thompson Grayer MD, [facility] 09/14/2015 1:41 PM

## 2015-09-13 NOTE — Progress Notes (Signed)
  Echocardiogram 2D Echocardiogram has been performed.  [name] 09/13/2015, 2:30 PM

## 2015-09-13 NOTE — Consult Note (Signed)
Family Medicine Teaching Service Consult Note Intern Pager: [phone]  Patient name: [name] Medical record number: [id number] Date of birth: 30-Mar-1966 Age: 50 y.o. Gender: male  Primary Care Provider: Chrisandra Netters, MD Primary Team: Cardiology Code Status: FULL  Assessment and Plan: 50yo male with history of atrial fibrillation, obesity, hypertension, OSA. Admitted for treatment of Atrial Fibrillation, Cariology primary. Consulted family medicine team for treatment of gout.  # Gout: Acute flair up. Denies fevers and septic arthritis unlikely given improvement with prednisone. Xray left foot with swelling, but no signs of fracture. Has tried Allopurinol in the past at it has helped to prevent gout. Has a flair ~q 4 months. Uric Acid; 7.2 (normal).  - Ibuprofen 600 mg PRN pain - Prednisone 60mg  daily while hospitalized and then send home with 10 day prednisone taper. Taper as follows: 50 mg x 2 days, 40 mg x 2 days, 30 mg x 2 days, 20 mg x 2 days, 10 mg x 2 days.  - Begin Allopurinol in 2-4 weeks at PCP's office - Will sign off today. Feel free to contact the family medicine team for any further questions  # Paroxysmal Atrial Fibrillation: First diagnosed in 2015. Echocardiogram in 03/2014 with EF 0000000, grade 2 diastolic dysfunction. Failed medical therapy with Metoprolol and Multaq. Admitted for Tikosyn. - Follow up repeat Echocardiogram (pending) - Tikosyn per Cardiology - Continue Eliquis  # GERD: Continue Home Protonix  # HTN: Not currently on medications. Stable. Continue to monitor.  FEN/GI: Heart Healthy Diet PPx: Eliquis  Disposition: Per Cardiology  Subjective:  - Patient was in restroom, unable to speak to him - Per wife, patient's gout pain has improved and he is able to ambulate better - Will sign off today  Objective: Temp:  [97.5 F (36.4 C)-97.8 F (36.6 C)] 97.8 F (36.6 C) (02/08 0551) Pulse Rate:  [57-68] 61 (02/08 0551) Resp:  [18-20] 20 (02/08  0551) BP: (129-153)/(74-92) 129/74 mmHg (02/08 0551) SpO2:  [97 %-100 %] 97 % (02/08 0551) Weight:  [327 lb 3.2 oz (148.417 kg)] 327 lb 3.2 oz (148.417 kg) (02/08 0551) Physical Exam: Unable to examine as patient was in restroom  Laboratory: No results for input(s): WBC, HGB, HCT, PLT in the last 168 hours.  Recent Labs Lab 09/11/15 1050 09/12/15 0530 09/13/15 0345  NA 141 137 141  K 4.1 4.2 3.7  CL 102 103 107  CO2 28 23 26   BUN 13 11 12   CREATININE 0.94 0.84 0.89  CALCIUM 9.6 9.4 9.0  GLUCOSE 105* 182* 167*   Imaging/Diagnostic Tests: Dg Foot Complete Left  09/04/2015  CLINICAL DATA:  Acute idiopathic gout of the LEFT foot, pain and swelling great toe for 10 days, not responding to steroids, no injury EXAM: LEFT FOOT - COMPLETE 3+ VIEW COMPARISON:  08/07/2006 FINDINGS: Diffuse soft tissue swelling LEFT foot. Osseous mineralization grossly normal. Joint space narrowing first MTP joint with soft tissue swelling focally at medial and minimally at dorsal aspects. No acute fracture, dislocation or bone destruction. No definite juxta-articular erosions identified. Remaining joint spaces preserved. IMPRESSION: Diffuse soft tissue swelling. Joint space narrowing and focal soft tissue swelling at LEFT first MTP joint without juxta-articular erosions. Electronically Signed   By: Lavonia Dana M.D.   On: 09/04/2015 14:18    Carlyle Dolly, MD 09/13/2015, 8:24 AM PGY-1, [facility] Intern pager: [phone], text pages welcome

## 2015-09-13 NOTE — Progress Notes (Addendum)
    SUBJECTIVE: The patient is doing well today.  At this time, he denies chest pain, shortness of breath, or any new concerns.  CURRENT MEDICATIONS: . apixaban  5 mg Oral BID  . dofetilide  500 mcg Oral BID  . oxymetazoline  1 spray Each Nare BID  . pantoprazole  40 mg Oral Daily  . potassium chloride  40 mEq Oral Once  . predniSONE  60 mg Oral Q breakfast  . sodium chloride flush  3 mL Intravenous Q12H      OBJECTIVE: Physical Exam: Filed Vitals:   09/12/15 1706 09/12/15 2116 09/12/15 2153 09/13/15 0551  BP: 133/76 153/92 138/77 129/74  Pulse: 68 57  61  Temp: 97.6 F (36.4 C) 97.5 F (36.4 C)  97.8 F (36.6 C)  TempSrc: Oral Oral  Oral  Resp:  18  20  Height:      Weight:    327 lb 3.2 oz (148.417 kg)  SpO2: 99% 100%  97%    Intake/Output Summary (Last 24 hours) at 09/13/15 0636 Last data filed at 09/12/15 0946  Gross per 24 hour  Intake    600 ml  Output      0 ml  Net    600 ml    Telemetry reveals sinus rhythm  GEN- The patient is morbidly obese appearing, alert and oriented x 3 today.   Head- normocephalic, atraumatic Eyes-  Sclera clear, conjunctiva pink Ears- hearing intact Oropharynx- clear Neck- supple  Lungs- Clear to ausculation bilaterally, normal work of breathing Heart- Regular rate and rhythm, no murmurs, rubs or gallops  GI- soft, NT, ND, + BS Extremities- no clubbing, cyanosis, or edema Skin- no rash or lesion Psych- euthymic mood, full affect Neuro- strength and sensation are intact  LABS: Basic Metabolic Panel:  Recent Labs  09/12/15 0530 09/13/15 0345  NA 137 141  K 4.2 3.7  CL 103 107  CO2 23 26  GLUCOSE 182* 167*  BUN 11 12  CREATININE 0.84 0.89  CALCIUM 9.4 9.0  MG 2.2 2.2    ASSESSMENT AND PLAN:  Active Problems:   Paroxysmal atrial fibrillation (HCC)   Acute gout    1. Paroxysmal atrial fibrillation The patient has paroxysmal atrial fibrillation and has failed Metoprolol as well as Multaq. He presented for  Tikosyn initiation.  Labs and QTc stable   Continue Tikosyn 58mcg twice daily  Continue Eliquis for CHADS2VASC of 1 Without lifestyle changes, I am concerned about our ability to maintain SR long term Update echo this admission  With weight, he is not a candidate for ablation at this time - discussed with patient  2. Morbid obesity Weight loss encouraged Without weight loss, long term prognosis is poor He and his spouse seem motivated to make lifestyle changes, they are willing to attend AF nutrition program, will provide information at discharge  3. HTN Stable No change required today  4. OSA Compliance with CPAP encouraged   5. Left foot gout Management per Family Medicine  6.  Hypokalemia Repleted today   Chanetta Marshall, NP 09/13/2015 6:36 AM    I have seen, examined the patient, and reviewed the above assessment and plan.  Ekg reveals QTc 440 msec.  Doing well at this time. Changes to above are made where necessary.   Hope to discharge tomorrow am.  I have stopped afrin and will use nasal saline spray for sinus issues.  Co Sign: Thompson Grayer, MD 09/13/2015 8:29 AM

## 2015-09-14 LAB — BASIC METABOLIC PANEL
ANION GAP: 10 (ref 5–15)
BUN: 15 mg/dL (ref 6–20)
CALCIUM: 9 mg/dL (ref 8.9–10.3)
CO2: 27 mmol/L (ref 22–32)
Chloride: 103 mmol/L (ref 101–111)
Creatinine, Ser: 0.97 mg/dL (ref 0.61–1.24)
GLUCOSE: 98 mg/dL (ref 65–99)
Potassium: 4.1 mmol/L (ref 3.5–5.1)
Sodium: 140 mmol/L (ref 135–145)

## 2015-09-14 LAB — MAGNESIUM: MAGNESIUM: 2.2 mg/dL (ref 1.7–2.4)

## 2015-09-14 MED ORDER — DOFETILIDE 500 MCG PO CAPS: 500.0000 ug | ORAL_CAPSULE | Freq: Two times a day (BID) | ORAL | Status: DC

## 2015-09-14 MED ORDER — PREDNISONE 10 MG PO TABS: ORAL_TABLET | ORAL | Status: DC

## 2015-09-14 NOTE — Progress Notes (Signed)
Discharge teaching and instructions reviewed. 7 day Tikosyn dose given to pt. VSS. Pt has no further questions. Pt discharging via wife.

## 2015-09-14 NOTE — Progress Notes (Signed)
    SUBJECTIVE: The patient is doing well today.  At this time, he denies chest pain, shortness of breath, or any new concerns.  CURRENT MEDICATIONS: . apixaban  5 mg Oral BID  . dofetilide  500 mcg Oral BID  . pantoprazole  40 mg Oral Daily  . predniSONE  60 mg Oral Q breakfast  . sodium chloride flush  3 mL Intravenous Q12H      OBJECTIVE: Physical Exam: Filed Vitals:   09/13/15 1704 09/13/15 2031 09/14/15 0504 09/14/15 0507  BP: 135/84 135/92 122/87   Pulse: 67 66 67   Temp: 97.5 F (36.4 C) 97.7 F (36.5 C) 97.6 F (36.4 C)   TempSrc: Oral Oral Oral   Resp: 15     Height:      Weight:    324 lb 1.6 oz (147.011 kg)  SpO2: 95% 100% 97%     Intake/Output Summary (Last 24 hours) at 09/14/15 [id number] Last data filed at 09/13/15 1700  Gross per 24 hour  Intake    900 ml  Output      0 ml  Net    900 ml    Telemetry reveals sinus rhythm, no arrhythmias  GEN- The patient is morbidly obese appearing, alert and oriented x 3 today.   Head- normocephalic, atraumatic Eyes-  Sclera clear, conjunctiva pink Ears- hearing intact Oropharynx- clear Neck- supple  Lungs- Clear to ausculation bilaterally, normal work of breathing Heart- Regular rate and rhythm, no murmurs, rubs or gallops  GI- soft, NT, ND, + BS Extremities- no clubbing, cyanosis, or edema Skin- no rash or lesion Psych- euthymic mood, full affect Neuro- strength and sensation are intact  LABS: Basic Metabolic Panel:  Recent Labs  09/12/15 0530 09/13/15 0345  NA 137 141  K 4.2 3.7  CL 103 107  CO2 23 26  GLUCOSE 182* 167*  BUN 11 12  CREATININE 0.84 0.89  CALCIUM 9.4 9.0  MG 2.2 2.2    ASSESSMENT AND PLAN:  Active Problems:   Paroxysmal atrial fibrillation (HCC)   Acute gout   ekg 09/13/15 reveals Qtc 435 msec  1. Paroxysmal atrial fibrillation Maintaining sinus with Tikosyn 545mcg twice daily  Continue Eliquis for CHADS2VASC of 1 Without lifestyle changes, I am concerned about our ability  to maintain SR long term Echo reviewed With weight, he is not a candidate for ablation at this time - discussed with patient  2. Morbid obesity Weight loss encouraged Without weight loss, long term prognosis is poor He and his spouse seem motivated to make lifestyle changes, they are willing to attend AF nutrition program, will provide information at discharge  3. HTN Stable No change required today  4. OSA Compliance with CPAP encouraged   5. Left foot gout Management per Family Medicine.  Would like to minimize the time that he is on steroids  6.  Hypokalemia bmet pending  Anticipate discharge later this am if QT remains stable after am dose   Thompson Grayer, MD 09/14/2015 7:05 AM

## 2015-09-21 ENCOUNTER — Encounter (HOSPITAL_COMMUNITY): Payer: Self-pay | Admitting: Nurse Practitioner

## 2015-09-21 ENCOUNTER — Ambulatory Visit (HOSPITAL_COMMUNITY)
Admission: RE | Admit: 2015-09-21 | Discharge: 2015-09-21 | Disposition: A | Discharge status: Home / Self Care | Payer: 59 | Source: Ambulatory Visit | Attending: Nurse Practitioner | Admitting: Nurse Practitioner

## 2015-09-21 VITALS — BP 130/84 | HR 75 | Ht 74.0 in | Wt 336.8 lb

## 2015-09-21 DIAGNOSIS — Z888 Allergy status to other drugs, medicaments and biological substances status: Secondary | ICD-10-CM | POA: Insufficient documentation

## 2015-09-21 DIAGNOSIS — Z880 Allergy status to penicillin: Secondary | ICD-10-CM | POA: Insufficient documentation

## 2015-09-21 DIAGNOSIS — I10 Essential (primary) hypertension: Secondary | ICD-10-CM | POA: Insufficient documentation

## 2015-09-21 DIAGNOSIS — M109 Gout, unspecified: Secondary | ICD-10-CM | POA: Insufficient documentation

## 2015-09-21 DIAGNOSIS — I480 Paroxysmal atrial fibrillation: Secondary | ICD-10-CM | POA: Diagnosis present

## 2015-09-21 DIAGNOSIS — Z7902 Long term (current) use of antithrombotics/antiplatelets: Secondary | ICD-10-CM | POA: Insufficient documentation

## 2015-09-21 DIAGNOSIS — Z6841 Body Mass Index (BMI) 40.0 and over, adult: Secondary | ICD-10-CM | POA: Diagnosis not present

## 2015-09-21 DIAGNOSIS — Z79899 Other long term (current) drug therapy: Secondary | ICD-10-CM | POA: Diagnosis not present

## 2015-09-21 DIAGNOSIS — Z823 Family history of stroke: Secondary | ICD-10-CM | POA: Diagnosis not present

## 2015-09-21 DIAGNOSIS — Z87891 Personal history of nicotine dependence: Secondary | ICD-10-CM | POA: Insufficient documentation

## 2015-09-21 DIAGNOSIS — K219 Gastro-esophageal reflux disease without esophagitis: Secondary | ICD-10-CM | POA: Insufficient documentation

## 2015-09-21 DIAGNOSIS — G4733 Obstructive sleep apnea (adult) (pediatric): Secondary | ICD-10-CM | POA: Insufficient documentation

## 2015-09-21 LAB — BASIC METABOLIC PANEL
ANION GAP: 11 (ref 5–15)
BUN: 13 mg/dL (ref 6–20)
CALCIUM: 9.5 mg/dL (ref 8.9–10.3)
CO2: 26 mmol/L (ref 22–32)
Chloride: 103 mmol/L (ref 101–111)
Creatinine, Ser: 1.01 mg/dL (ref 0.61–1.24)
Glucose, Bld: 118 mg/dL — ABNORMAL HIGH (ref 65–99)
Potassium: 4.3 mmol/L (ref 3.5–5.1)
SODIUM: 140 mmol/L (ref 135–145)

## 2015-09-21 LAB — MAGNESIUM: Magnesium: 2.1 mg/dL (ref 1.7–2.4)

## 2015-09-21 NOTE — Progress Notes (Signed)
Patient ID: [name], male   DOB: [birth year], 50 y.o.   MRN: [id number]     Primary Care Physician: Wayne Netters, MD Referring Physician: Dr. Rayann Leblanc, [facility] f/u   [name] is a 50 y.o. male with a h/o PAF that is here today for f/u of tikosyn loading renal function and electrolytes were followed during the hospitalization. Their QTc remained stable. They were monitored until discharge on telemetry which demonstrated sinus rhythm. On the day of discharge, he were examined by Dr Rayann Leblanc who considered him stable for discharge to home. Follow-up has been arranged with Wayne Palau, NP in 1 week and 4 weeks and with Dr Rayann Leblanc in 12 weeks.   He has had recent gout flare up and was seen by the [facility] service this admission who knows him well. Recommendations are for prednisone taper followed by initiation of allopurinol to be started as an outpatient.   In the afib clinic, he reports doing well. No further afib. Understands purpose for tokosyn, and need to take on regular schedule without missed doses. He has had tikosyn filled without issues.Continues with eliquis. Is finishing prednisone taper for gout. QTc is stable, 433ms. K+/mag have been stable in the hospital and will be rechecked today.   Today, he denies symptoms of palpitations, chest pain, shortness of breath, orthopnea, PND, lower extremity edema, dizziness, presyncope, syncope, or neurologic sequela. The patient is tolerating medications without difficulties and is otherwise without complaint today.   Past Medical History  Diagnosis Date  . Arthritis   . 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 History of MRI of cervical spine 09/25/10    normal; left shoulder normal as well  . Gout   . Hypertension   . OSA on CPAP   . GERD (gastroesophageal reflux disease)   . Migraine     "about monthly" (03/03/2015)  . Morbid obesity ([place])   . Paroxysmal atrial  fibrillation ([place]) 7/15    chads2vasc score is 1   Past Surgical History  Procedure Laterality Date  . Knee arthroscopy Right 02/2013  . Cardiac catheterization  05/2005    "no blockage"    Current Outpatient Prescriptions  Medication Sig Dispense Refill  . aspirin-acetaminophen-caffeine (EXCEDRIN MIGRAINE) 250-250-65 MG per tablet Take 2 tablets by mouth every 6 (six) hours as needed for headache.    . cyclobenzaprine (FLEXERIL) 5 MG tablet Take 1 tablet (5 mg total) by mouth 3 (three) times daily as needed for muscle spasms. 30 tablet 0  . dofetilide (TIKOSYN) 500 MCG capsule Take 1 capsule (500 mcg total) by mouth 2 (two) times daily. 180 capsule 3  . ELIQUIS 5 MG TABS tablet TAKE ONE TABLET BY MOUTH TWICE DAILY 60 tablet 5  . ibuprofen (ADVIL,MOTRIN) 200 MG tablet Take 800 mg by mouth every 6 (six) hours as needed (pain).    Marland Kitchen ibuprofen (ADVIL,MOTRIN) 600 MG tablet Take 1 tablet (600 mg total) by mouth every 8 (eight) hours as needed for moderate pain. 30 tablet 0  . MITIGARE 0.6 MG CAPS Take 1 tablet by mouth 2 (two) times daily as needed (gout). (Patient taking differently: Take 0.6 mg by mouth 2 (two) times daily as needed (gout). colchicine) 30 capsule 1  . Omega-3 Fatty Acids (FISH OIL) 1000 MG CAPS Take 2,000 mg by mouth daily.     Marland Kitchen omeprazole (PRILOSEC) 20 MG capsule TAKE ONE CAPSULE BY MOUTH TWICE DAILY 60 capsule 1  .  Phenylephrine-Acetaminophen (TYLENOL SINUS CONGESTION/PAIN) 5-325 MG TABS Take 2 tablets by mouth every 4 (four) hours as needed (sinus congestion).    . predniSONE (DELTASONE) 10 MG tablet Take 50mg  qd for 2 days, then 40mg  qd for 2 days, then 30mg  qd for 2 days, then 20mg  qd for 2 days, then 10mg  qd for 2 days 30 tablet 0  . PRESCRIPTION MEDICATION Inhale into the lungs at bedtime. CPAP     No current facility-administered medications for this encounter.    Allergies  Allergen Reactions  . Hydrochlorothiazide-Propranolol [Propranolol-Hctz] Other (See  Comments)    Joints lock up  . Penicillins Other (See Comments)    Makes joints stiff and unable to move Has patient had a PCN reaction causing immediate rash, facial/tongue/throat swelling, SOB or lightheadedness with hypotension: No Has patient had a PCN reaction causing severe rash involving mucus membranes or skin necrosis: No Has patient had a PCN reaction that required hospitalization No Has patient had a PCN reaction occurring within the last 10 years: No If all of the above answers are "NO", then may proceed with Cephalosporin use.    Social History   Social History  . Marital Status: Married    Spouse Name: N/A  . Number of Children: N/A  . Years of Education: N/A   Occupational History  . Not on file.   Social History Main Topics  . Smoking status: Former Smoker -- 1.00 packs/day for 10 years    Types: Cigarettes    Quit date: 04/02/1994  . Smokeless tobacco: Former Systems developer    Types: Chew     Comment: "chewed in my teens"  . Alcohol Use: 0.0 oz/week    0 Standard drinks or equivalent per week     Comment: "stopped in 1994"  . Drug Use: Yes    Special: Marijuana     Comment: "stopped marijuna in 1994"  . Sexual Activity: Yes   Other Topics Concern  . Not on file   Social History Narrative   Lives in [place] with spouse.  3 children, all grown.   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 Atrial fibrillation Mother 58    had a stroke    ROS- All systems are reviewed and negative except as per the HPI above  Physical Exam: Filed Vitals:   09/21/15 0933  BP: 130/84  Pulse: 75  Height: 6\' 2"  (1.88 m)  Weight: 336 lb 12.8 oz (152.771 kg)    GEN- The patient is well appearing, alert and oriented x 3 today.   Head- normocephalic, atraumatic Eyes-  Sclera clear, conjunctiva pink Ears- hearing intact Oropharynx- clear Neck- supple, no JVP Lymph- no cervical  lymphadenopathy Lungs- Clear to ausculation bilaterally, normal work of breathing Heart- Regular rate and rhythm, no murmurs, rubs or gallops, PMI not laterally displaced GI- soft, NT, ND, + BS Extremities- no clubbing, cyanosis, or edema MS- no significant deformity or atrophy Skin- no rash or lesion Psych- euthymic mood, full affect Neuro- strength and sensation are intact  EKG-NSR at 75 bpm, Pr int 170 ms, QRS int 88 ms, Qtc 417 ms Epic records reviewed  Assessment and Plan: 1. afib Doing well on dofetilide 500 mg bid, qtc stable Continue eliquis Precautions re timing of drug and potential drug interactions discussed Bmet/mag today  2. Obesity Encouraged weight loss and increased activity  F/u 3/6 afib clinic  Geroge Baseman Carroll, [facility] [address] [place], [place] [address] [phone]

## 2015-09-25 ENCOUNTER — Encounter: Payer: Self-pay | Admitting: Family Medicine

## 2015-09-25 ENCOUNTER — Ambulatory Visit (INDEPENDENT_AMBULATORY_CARE_PROVIDER_SITE_OTHER): Payer: 59 | Admitting: Family Medicine

## 2015-09-25 VITALS — BP 139/89 | HR 80 | Temp 97.8°F | Wt 332.0 lb

## 2015-09-25 DIAGNOSIS — M10072 Idiopathic gout, left ankle and foot: Secondary | ICD-10-CM | POA: Diagnosis not present

## 2015-09-25 DIAGNOSIS — M109 Gout, unspecified: Secondary | ICD-10-CM

## 2015-09-25 MED ORDER — INDOMETHACIN 50 MG PO CAPS: 50.0000 mg | ORAL_CAPSULE | Freq: Three times a day (TID) | ORAL | Status: DC

## 2015-09-25 NOTE — Patient Instructions (Signed)
Indomethacin 50mg , take up to 3 times a day with meals  Return next Monday and if you are doing better can likely restart allopurinol and also colchicine prophylaxis.

## 2015-09-25 NOTE — Progress Notes (Signed)
   Subjective:    Patient ID: [name], male    DOB: [birth year], 50 y.o.   MRN: [id number]  HPI  Patient presents for Same Day Appointment  CC: gout  # Gout :  Left big toe, has been ongoing for more than a month  Had been treated with prednisone, didn't get better, went on higher dose prednisone which seemed to help but last Wednesday started tapering this down and pain started coming back. Says last dose of prednisone was yesterday  Denies any big protein meals or alcohol use (says he had a few eggs yesterday)  Has had issues with gout in the past, has been on allopurinol before  Has had indomethacin, meloxicam in the past, indomethacin seemed to help  Currently on eliquis for afib ROS: no numbness/tingilng, no bleeding/blood in urine or stool  Social Hx: former smoker  Review of Systems   See HPI for ROS.   Past medical history, surgical, family, and social history reviewed and updated in the EMR as appropriate.  Objective:  BP 139/89 mmHg  Pulse 80  Temp(Src) 97.8 F (36.6 C)  Wt 332 lb (150.594 kg) Vitals and nursing note reviewed  General: no apparent distress  MSK: Left great toe swollen and erythematous, rapid cap refill, tenderness on medial aspect. There is no erythema extending up the foot that would be concerning for infection. He is able to move all of his toes.  Assessment & Plan:   1. Acute gout of left foot, unspecified cause Recurrent exacerbation. Would ideally like to get him back on allopurinol but not in setting of current flare. Since he has now essentially failed prednisone therapy, discussed with Dr. Ree Kida and patient the risks of NSAID therapy with eliquis, but agreed on short course of indomethacin in an attempt to get flare under control. He has follow up next week, where if he is doing well would recommend starting allopurinol with colchicine prophylaxis (0.6-1.2mg  daily).

## 2015-10-02 ENCOUNTER — Encounter: Payer: Self-pay | Admitting: Family Medicine

## 2015-10-02 ENCOUNTER — Ambulatory Visit (INDEPENDENT_AMBULATORY_CARE_PROVIDER_SITE_OTHER): Payer: 59 | Admitting: Family Medicine

## 2015-10-02 VITALS — BP 143/85 | HR 64 | Temp 97.9°F | Ht 74.0 in | Wt 334.1 lb

## 2015-10-02 DIAGNOSIS — M109 Gout, unspecified: Secondary | ICD-10-CM | POA: Diagnosis not present

## 2015-10-02 MED ORDER — FLUTICASONE PROPIONATE 50 MCG/ACT NA SUSP: 2.0000 | Freq: Every day | NASAL | Status: DC

## 2015-10-02 MED ORDER — ALLOPURINOL 100 MG PO TABS
100.0000 mg | ORAL_TABLET | Freq: Every day | ORAL | Status: DC
Start: 2015-10-02 — End: 2016-01-11

## 2015-10-02 MED ORDER — COLCHICINE 0.6 MG PO TABS: 0.6000 mg | ORAL_TABLET | Freq: Every day | ORAL | Status: DC

## 2015-10-02 NOTE — Patient Instructions (Addendum)
Stop indomethacin Start colchicine and allopurinol  Sent in flonase for your sinuses  Follow up sooner if blisters or rash worsen Follow up with me in 3-4 weeks  Be well, Dr. Ardelia Mems

## 2015-10-05 ENCOUNTER — Telehealth (HOSPITAL_COMMUNITY): Payer: Self-pay | Admitting: *Deleted

## 2015-10-05 NOTE — Telephone Encounter (Signed)
Pt called in stating he is experiencing some shortness of breath and dizziness for the last few days since being on indocin for gout. This is being followed by his PCP. He states he has noticed swelling in lower extremities and some weight gain since starting gout treatment. He is not in afib though he did have it a few days ago. He is not having any chest pain or palpitations at this time. Encouraged patient to call to PCP to inform them of these changes as he may require short-term diuretic use (lasix since on tikosyn) for fluid retention from gout medications. Patient verbalized understanding and will call to PCP office now.

## 2015-10-06 NOTE — Assessment & Plan Note (Signed)
Acute flare resolved. Blisters are likely tophaceous lesions of skin. Will start allopurinol & colchicine today. Stop indomethacin. Follow up with me in several weeks to see how he's doing.

## 2015-10-06 NOTE — Progress Notes (Signed)
Date of Visit: 10/02/2015   HPI:  Patient presents for routine follow up. Was recently hospitalized to start on tikosyn for atrial fibrillation. Doing well with that. Felt himself go into afib once but it resolved.  Had gout flare in hospital. Treated with indomethacin. Acute gout has resolved. Has several blisters on his foot, wonders if these could be related to gout.  Is not on urate lowering therapy.   Has headache, thinks due to sinus pressure. Also thinsk due to weather change. Sinuses feel full. Happened over last couple of days. No vision changes.   ROS: See HPI.  [place]: history of afib, gerd, gout, osa, hyperlipidemia   PHYSICAL EXAM: BP 143/85 mmHg  Pulse 64  Temp(Src) 97.9 F (36.6 C) (Oral)  Ht 6\' 2"  (1.88 m)  Wt 334 lb 1.6 oz (151.547 kg)  BMI 42.88 kg/m2 Gen: NAD, pleasant, cooperative HEENT: normocephalic, atraumatic. Oropharynx clear and moist. Nares patent  Heart: regular rate and rhythm no murmur Lungs: clear to auscultation bilaterally, normal work of breathing  Neuro: grossly nonfocal speech normal Ext: one foot with several small <1cm blisters on plantar surface that are nontender and have cloudy appearance, firm with palpation. Area of prior acute gout has improved, able to move toe without pain. No warmth or tenderness.   ASSESSMENT/PLAN:  GOUT Acute flare resolved. Blisters are likely tophaceous lesions of skin. Will start allopurinol & colchicine today. Stop indomethacin. Follow up with me in several weeks to see how he's doing.   headache/sinus pressure - well appearing today. Will do trial of flonase. Follow up if not improving  FOLLOW UP: Follow up in several weeks for gout   [name], [place]

## 2015-10-09 ENCOUNTER — Ambulatory Visit: Payer: 59 | Admitting: Family Medicine

## 2015-10-09 ENCOUNTER — Ambulatory Visit (HOSPITAL_COMMUNITY)
Admission: RE | Admit: 2015-10-09 | Discharge: 2015-10-09 | Disposition: A | Discharge status: Home / Self Care | Payer: 59 | Source: Ambulatory Visit | Attending: Nurse Practitioner | Admitting: Nurse Practitioner

## 2015-10-09 VITALS — BP 140/90 | HR 65 | Ht 74.0 in | Wt 333.2 lb

## 2015-10-09 DIAGNOSIS — K219 Gastro-esophageal reflux disease without esophagitis: Secondary | ICD-10-CM | POA: Insufficient documentation

## 2015-10-09 DIAGNOSIS — Z7902 Long term (current) use of antithrombotics/antiplatelets: Secondary | ICD-10-CM | POA: Insufficient documentation

## 2015-10-09 DIAGNOSIS — Z880 Allergy status to penicillin: Secondary | ICD-10-CM | POA: Insufficient documentation

## 2015-10-09 DIAGNOSIS — I480 Paroxysmal atrial fibrillation: Secondary | ICD-10-CM | POA: Diagnosis present

## 2015-10-09 DIAGNOSIS — M109 Gout, unspecified: Secondary | ICD-10-CM | POA: Insufficient documentation

## 2015-10-09 DIAGNOSIS — Z888 Allergy status to other drugs, medicaments and biological substances status: Secondary | ICD-10-CM | POA: Diagnosis not present

## 2015-10-09 DIAGNOSIS — I10 Essential (primary) hypertension: Secondary | ICD-10-CM | POA: Insufficient documentation

## 2015-10-09 DIAGNOSIS — G4733 Obstructive sleep apnea (adult) (pediatric): Secondary | ICD-10-CM | POA: Insufficient documentation

## 2015-10-09 DIAGNOSIS — Z79899 Other long term (current) drug therapy: Secondary | ICD-10-CM | POA: Diagnosis not present

## 2015-10-09 DIAGNOSIS — Z87891 Personal history of nicotine dependence: Secondary | ICD-10-CM | POA: Diagnosis not present

## 2015-10-09 DIAGNOSIS — Z823 Family history of stroke: Secondary | ICD-10-CM | POA: Insufficient documentation

## 2015-10-09 DIAGNOSIS — Z6841 Body Mass Index (BMI) 40.0 and over, adult: Secondary | ICD-10-CM | POA: Insufficient documentation

## 2015-10-09 LAB — BASIC METABOLIC PANEL
Anion gap: 9 (ref 5–15)
BUN: 8 mg/dL (ref 6–20)
CALCIUM: 9.6 mg/dL (ref 8.9–10.3)
CO2: 26 mmol/L (ref 22–32)
CREATININE: 0.98 mg/dL (ref 0.61–1.24)
Chloride: 107 mmol/L (ref 101–111)
GLUCOSE: 117 mg/dL — AB (ref 65–99)
Potassium: 4.3 mmol/L (ref 3.5–5.1)
Sodium: 142 mmol/L (ref 135–145)

## 2015-10-09 LAB — MAGNESIUM: Magnesium: 2.2 mg/dL (ref 1.7–2.4)

## 2015-10-09 NOTE — Progress Notes (Signed)
Patient ID: [name], male   DOB: 1965/10/17, 50 y.o.   MRN: [id number]     Primary Care Physician: Chrisandra Netters, MD Referring Physician: Dr. Rayann Heman, [facility] f/u   [name] is a 50 y.o. male with a h/o PAF that is here today for f/u of tikosyn. He reports that he has maintained SR other than 5 hours a week ago Sunday. It corrected itself and did not feel as bad as previous epiosdes of afib before tikosyn. He has been under treatment for gout and has continued taking Indocin sine colchicine was more expensive. He called tha offic las t week with feeling short of breath, ankle edema and h/a. I suggested  trying to minimize Indocin and possible fluid retention that may be secondary to the NSAID. He did minimize drug and feels better. Back on colchicine.   In the afib clinic, he reports doing well.Understands purpose for tokosyn, and need to take on regular schedule without missed doses. He has had tikosyn filled without issues.Continues with eliquis.QTc is stable, 465ms. K+/mag have been stable, and will be rechecked today.   Today, he denies symptoms of palpitations, chest pain, shortness of breath, orthopnea, PND, lower extremity edema, dizziness, presyncope, syncope, or neurologic sequela. The patient is tolerating medications without difficulties and is otherwise without complaint today.   Past Medical History  Diagnosis Date  . Arthritis   . 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 History of MRI of cervical spine 09/25/10    normal; left shoulder normal as well  . Gout   . Hypertension   . OSA on CPAP   . GERD (gastroesophageal reflux disease)   . Migraine     "about monthly" (03/03/2015)  . Morbid obesity ([place])   . Paroxysmal atrial fibrillation ([place]) 7/15    chads2vasc score is 1   Past Surgical History  Procedure Laterality Date  . Knee arthroscopy Right 02/2013  . Cardiac catheterization  05/2005    "no  blockage"    Current Outpatient Prescriptions  Medication Sig Dispense Refill  . allopurinol (ZYLOPRIM) 100 MG tablet Take 1 tablet (100 mg total) by mouth daily. 30 tablet 1  . aspirin-acetaminophen-caffeine (EXCEDRIN MIGRAINE) [id number] MG per tablet Take 2 tablets by mouth every 6 (six) hours as needed for headache.    . colchicine 0.6 MG tablet Take 1 tablet (0.6 mg total) by mouth daily. 30 tablet 1  . cyclobenzaprine (FLEXERIL) 5 MG tablet Take 1 tablet (5 mg total) by mouth 3 (three) times daily as needed for muscle spasms. 30 tablet 0  . dofetilide (TIKOSYN) 500 MCG capsule Take 1 capsule (500 mcg total) by mouth 2 (two) times daily. 180 capsule 3  . ELIQUIS 5 MG TABS tablet TAKE ONE TABLET BY MOUTH TWICE DAILY 60 tablet 5  . fluticasone (FLONASE) 50 MCG/ACT nasal spray Place 2 sprays into both nostrils daily. 16 g 2  . ibuprofen (ADVIL,MOTRIN) 200 MG tablet Take 800 mg by mouth every 6 (six) hours as needed (pain).    Marland Kitchen ibuprofen (ADVIL,MOTRIN) 600 MG tablet Take 1 tablet (600 mg total) by mouth every 8 (eight) hours as needed for moderate pain. 30 tablet 0  . Omega-3 Fatty Acids (FISH OIL) 1000 MG CAPS Take 2,000 mg by mouth daily.     Marland Kitchen omeprazole (PRILOSEC) 20 MG capsule TAKE ONE CAPSULE BY MOUTH TWICE DAILY 60 capsule 1  . Phenylephrine-Acetaminophen (TYLENOL SINUS CONGESTION/PAIN) 5-325 MG  TABS Take 2 tablets by mouth every 4 (four) hours as needed (sinus congestion).    Marland Kitchen PRESCRIPTION MEDICATION Inhale into the lungs at bedtime. CPAP     No current facility-administered medications for this encounter.    Allergies  Allergen Reactions  . Hydrochlorothiazide-Propranolol [Propranolol-Hctz] Other (See Comments)    Joints lock up  . Penicillins Other (See Comments)    Makes joints stiff and unable to move Has patient had a PCN reaction causing immediate rash, facial/tongue/throat swelling, SOB or lightheadedness with hypotension: No Has patient had a PCN reaction causing  severe rash involving mucus membranes or skin necrosis: No Has patient had a PCN reaction that required hospitalization No Has patient had a PCN reaction occurring within the last 10 years: No If all of the above answers are "NO", then may proceed with Cephalosporin use.    Social History   Social History  . Marital Status: Married    Spouse Name: N/A  . Number of Children: N/A  . Years of Education: N/A   Occupational History  . Not on file.   Social History Main Topics  . Smoking status: Former Smoker -- 1.00 packs/day for 10 years    Types: Cigarettes    Quit date: 04/02/1994  . Smokeless tobacco: Former Systems developer    Types: Chew     Comment: "chewed in my teens"  . Alcohol Use: 0.0 oz/week    0 Standard drinks or equivalent per week     Comment: "stopped in 1994"  . Drug Use: Yes    Special: Marijuana     Comment: "stopped marijuna in 1994"  . Sexual Activity: Yes   Other Topics Concern  . Not on file   Social History Narrative   Lives in [place] with spouse.  3 children, all grown.   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 Atrial fibrillation Mother 48    had a stroke    ROS- All systems are reviewed and negative except as per the HPI above  Physical Exam: Filed Vitals:   10/09/15 0940  BP: 140/90  Pulse: 65  Height: 6\' 2"  (1.88 m)  Weight: 333 lb 3.2 oz (151.139 kg)    GEN- The patient is well appearing, alert and oriented x 3 today.   Head- normocephalic, atraumatic Eyes-  Sclera clear, conjunctiva pink Ears- hearing intact Oropharynx- clear Neck- supple, no JVP Lymph- no cervical lymphadenopathy Lungs- Clear to ausculation bilaterally, normal work of breathing Heart- Regular rate and rhythm, no murmurs, rubs or gallops, PMI not laterally displaced GI- soft, NT, ND, + BS Extremities- no clubbing, cyanosis, or edema MS- no significant deformity or atrophy Skin- no rash  or lesion Psych- euthymic mood, full affect Neuro- strength and sensation are intact  EKG-NSR at 65 bpm, Pr int 170 ms, QRS int 92 ms, Qtc 436 ms Epic records reviewed  Assessment and Plan: 1. afib Doing well on dofetilide 500 mg bid, qtc stable Continue eliquis Precautions re timing of drug and potential drug interactions discussed Bmet/mag today  2. Obesity Encouraged weight loss and increased activity  3. Gout flare, resolving Asked to minimize indocin  F/u in May as scheduled with Dr. Lawrence Marseilles C. Patterson Hollenbaugh, [facility] [address] [place],  [address] [phone]

## 2015-10-11 ENCOUNTER — Ambulatory Visit (HOSPITAL_COMMUNITY): Payer: 59 | Admitting: Nurse Practitioner

## 2015-11-21 ENCOUNTER — Telehealth: Payer: Self-pay | Admitting: Family Medicine

## 2015-11-21 NOTE — Telephone Encounter (Signed)
I received order request for CPAP supplies from [employer] in [place]. Red team, please call patient to determine if this is a legitimate need (did he actually need new supplies & got them from this company)  If he confirms it I will sign orders and send in.  Thanks [name], MD

## 2015-11-27 NOTE — Telephone Encounter (Signed)
Spoke with patient he confirms that he did initiate this request.

## 2015-11-28 NOTE — Telephone Encounter (Signed)
Excellent. I will sign orders & fax back. [name], MD

## 2015-12-18 ENCOUNTER — Encounter: Payer: 59 | Admitting: Internal Medicine

## 2016-01-03 ENCOUNTER — Emergency Department (HOSPITAL_COMMUNITY): Payer: 59

## 2016-01-03 ENCOUNTER — Encounter (HOSPITAL_COMMUNITY): Payer: Self-pay | Admitting: Emergency Medicine

## 2016-01-03 ENCOUNTER — Emergency Department (HOSPITAL_COMMUNITY)
Admission: EM | Admit: 2016-01-03 | Discharge: 2016-01-03 | Disposition: A | Discharge status: Home / Self Care | Payer: 59 | Attending: Emergency Medicine | Admitting: Emergency Medicine

## 2016-01-03 DIAGNOSIS — Z9981 Dependence on supplemental oxygen: Secondary | ICD-10-CM | POA: Insufficient documentation

## 2016-01-03 DIAGNOSIS — Z87891 Personal history of nicotine dependence: Secondary | ICD-10-CM | POA: Diagnosis not present

## 2016-01-03 DIAGNOSIS — G4733 Obstructive sleep apnea (adult) (pediatric): Secondary | ICD-10-CM | POA: Insufficient documentation

## 2016-01-03 DIAGNOSIS — M1990 Unspecified osteoarthritis, unspecified site: Secondary | ICD-10-CM | POA: Insufficient documentation

## 2016-01-03 DIAGNOSIS — Z880 Allergy status to penicillin: Secondary | ICD-10-CM | POA: Insufficient documentation

## 2016-01-03 DIAGNOSIS — I4891 Unspecified atrial fibrillation: Secondary | ICD-10-CM | POA: Diagnosis not present

## 2016-01-03 DIAGNOSIS — M5412 Radiculopathy, cervical region: Secondary | ICD-10-CM

## 2016-01-03 DIAGNOSIS — Z79899 Other long term (current) drug therapy: Secondary | ICD-10-CM | POA: Insufficient documentation

## 2016-01-03 DIAGNOSIS — G43909 Migraine, unspecified, not intractable, without status migrainosus: Secondary | ICD-10-CM | POA: Diagnosis not present

## 2016-01-03 DIAGNOSIS — K219 Gastro-esophageal reflux disease without esophagitis: Secondary | ICD-10-CM | POA: Diagnosis not present

## 2016-01-03 DIAGNOSIS — I10 Essential (primary) hypertension: Secondary | ICD-10-CM | POA: Diagnosis not present

## 2016-01-03 DIAGNOSIS — R200 Anesthesia of skin: Secondary | ICD-10-CM | POA: Diagnosis present

## 2016-01-03 LAB — I-STAT CHEM 8, ED
BUN: 8 mg/dL (ref 6–20)
CREATININE: 0.9 mg/dL (ref 0.61–1.24)
Calcium, Ion: 1.21 mmol/L (ref 1.12–1.23)
Chloride: 104 mmol/L (ref 101–111)
GLUCOSE: 126 mg/dL — AB (ref 65–99)
HEMATOCRIT: 40 % (ref 39.0–52.0)
HEMOGLOBIN: 13.6 g/dL (ref 13.0–17.0)
Potassium: 3.6 mmol/L (ref 3.5–5.1)
Sodium: 142 mmol/L (ref 135–145)
TCO2: 24 mmol/L (ref 0–100)

## 2016-01-03 LAB — CBG MONITORING, ED: Glucose-Capillary: 92 mg/dL (ref 65–99)

## 2016-01-03 LAB — COMPREHENSIVE METABOLIC PANEL
ALBUMIN: 3.7 g/dL (ref 3.5–5.0)
ALK PHOS: 78 U/L (ref 38–126)
ALT: 27 U/L (ref 17–63)
AST: 30 U/L (ref 15–41)
Anion gap: 7 (ref 5–15)
BUN: 7 mg/dL (ref 6–20)
CHLORIDE: 107 mmol/L (ref 101–111)
CO2: 24 mmol/L (ref 22–32)
CREATININE: 0.99 mg/dL (ref 0.61–1.24)
Calcium: 9.2 mg/dL (ref 8.9–10.3)
GFR calc non Af Amer: >60 mL/min (ref >60)
GLUCOSE: 127 mg/dL — AB (ref 65–99)
Potassium: 3.5 mmol/L (ref 3.5–5.1)
SODIUM: 138 mmol/L (ref 135–145)
Total Bilirubin: 0.3 mg/dL (ref 0.3–1.2)
Total Protein: 6.7 g/dL (ref 6.5–8.1)

## 2016-01-03 LAB — I-STAT TROPONIN, ED: Troponin i, poc: 0 ng/mL (ref 0.00–0.08)

## 2016-01-03 LAB — PROTIME-INR
INR: 1.18 (ref 0.00–1.49)
PROTHROMBIN TIME: 15.2 s (ref 11.6–15.2)

## 2016-01-03 LAB — CBC
HCT: 40.2 % (ref 39.0–52.0)
Hemoglobin: 12.9 g/dL — ABNORMAL LOW (ref 13.0–17.0)
MCH: 26.3 pg (ref 26.0–34.0)
MCHC: 32.1 g/dL (ref 30.0–36.0)
MCV: 82 fL (ref 78.0–100.0)
PLATELETS: 206 10*3/uL (ref 150–400)
RBC: 4.9 MIL/uL (ref 4.22–5.81)
RDW: 13.1 % (ref 11.5–15.5)
WBC: 7.2 10*3/uL (ref 4.0–10.5)

## 2016-01-03 LAB — DIFFERENTIAL
BASOS ABS: 0 10*3/uL (ref 0.0–0.1)
BASOS PCT: 0 %
Eosinophils Absolute: 0.3 10*3/uL (ref 0.0–0.7)
Eosinophils Relative: 4 %
LYMPHS PCT: 37 %
Lymphs Abs: 2.7 10*3/uL (ref 0.7–4.0)
MONO ABS: 0.5 10*3/uL (ref 0.1–1.0)
Monocytes Relative: 7 %
NEUTROS ABS: 3.7 10*3/uL (ref 1.7–7.7)
NEUTROS PCT: 52 %

## 2016-01-03 LAB — APTT: APTT: 42 s — AB (ref 24–37)

## 2016-01-03 MED ORDER — OXYCODONE-ACETAMINOPHEN 5-325 MG PO TABS
1.0000 | ORAL_TABLET | ORAL | Status: DC | PRN
Start: 2016-01-03 — End: 2016-03-20

## 2016-01-03 MED ORDER — DEXAMETHASONE SODIUM PHOSPHATE 10 MG/ML IJ SOLN
10.0000 mg | Freq: Once | INTRAMUSCULAR | Status: AC
  Administered 2016-01-03: 10 mg via INTRAMUSCULAR
  Filled 2016-01-03: qty 1

## 2016-01-03 MED ORDER — PREDNISONE 10 MG (21) PO TBPK: 10.0000 mg | ORAL_TABLET | Freq: Every day | ORAL | Status: DC

## 2016-01-03 MED ORDER — OXYCODONE-ACETAMINOPHEN 5-325 MG PO TABS
1.0000 | ORAL_TABLET | Freq: Once | ORAL | Status: AC
  Administered 2016-01-03: 1 via ORAL
  Filled 2016-01-03: qty 1

## 2016-01-03 NOTE — ED Notes (Addendum)
Pt sts left sided numbness upon waking this am and pain in chest; no obvious neuro deficits noted

## 2016-01-03 NOTE — ED Provider Notes (Signed)
CSN: [id number]     Arrival date & time 01/03/16  1229 History   First MD Initiated Contact with Patient 01/03/16 1706     Chief Complaint  Patient presents with  . Numbness  . Chest Pain   PT HAS HAD INTERMITTENT LEFT ARM NUMBNESS FOR THE PAST 2 WEEKS.  TODAY, THE NUMBNESS WOULD NOT GO AWAY.  PT SAID THAT IT STARTS IN HIS NECK AND TRAVELS DOWN HIS LEFT ARM AND INTO THE LEFT SIDE OF HIS CHEST.  THE PT SAID HE CALLED HIS PCP TODAY TO BE SEEN, BUT THEY WERE TOO BUSY.    (Consider location/radiation/quality/duration/timing/severity/associated sxs/prior Treatment) Patient is a 50 y.o. male presenting with chest pain. The history is provided by the patient.  Chest Pain Pain location:  L chest Pain radiates to:  L arm Pain radiates to the back: no   Pain severity:  Moderate Onset quality:  Gradual Associated symptoms: numbness     Past Medical History  Diagnosis Date  . Arthritis   . 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 History of MRI of cervical spine 09/25/10    normal; left shoulder normal as well  . Gout   . Hypertension   . OSA on CPAP   . GERD (gastroesophageal reflux disease)   . Migraine     "about monthly" (03/03/2015)  . Morbid obesity ([place])   . Paroxysmal atrial fibrillation ([place]) 7/15    chads2vasc score is 1   Past Surgical History  Procedure Laterality Date  . Knee arthroscopy Right 02/2013  . Cardiac catheterization  05/2005    "no blockage"   Family History  Problem Relation Age of Onset  . Atrial fibrillation Mother 19    had a stroke   Social History  Substance Use Topics  . Smoking status: Former Smoker -- 1.00 packs/day for 10 years    Types: Cigarettes    Quit date: 04/02/1994  . Smokeless tobacco: Former Systems developer    Types: Chew     Comment: "chewed in my teens"  . Alcohol Use: 0.0 oz/week    0 Standard drinks or equivalent per week     Comment: "stopped in 1994"    Review of  Systems  Cardiovascular: Positive for chest pain.  Neurological: Positive for numbness.  All other systems reviewed and are negative.     Allergies  Hydrochlorothiazide-propranolol and Penicillins  Home Medications   Prior to Admission medications   Medication Sig Start Date End Date Taking? Authorizing Provider  aspirin-acetaminophen-caffeine (EXCEDRIN MIGRAINE) [phone] MG per tablet Take 2 tablets by mouth every 6 (six) hours as needed for headache.   Yes Historical Provider, MD  dofetilide (TIKOSYN) 500 MCG capsule Take 1 capsule (500 mcg total) by mouth 2 (two) times daily. 09/14/15  Yes Amber Sena Slate, NP  ELIQUIS 5 MG TABS tablet TAKE ONE TABLET BY MOUTH TWICE DAILY 07/13/15  Yes [name], MD  fluticasone [facility]) 50 MCG/ACT nasal spray Place 2 sprays into both nostrils daily. 10/02/15  Yes [name], MD  ibuprofen (ADVIL,MOTRIN) 200 MG tablet Take 800 mg by mouth every 6 (six) hours as needed (pain).   Yes Historical Provider, MD  ibuprofen (ADVIL,MOTRIN) 600 MG tablet Take 1 tablet (600 mg total) by mouth every 8 (eight) hours as needed for moderate pain. 05/26/15  Yes Katheren Shams, DO  Omega-3 Fatty Acids (FISH OIL) 1000 MG CAPS Take 2,000 mg by  mouth daily.    Yes Historical Provider, MD  omeprazole (PRILOSEC) 20 MG capsule TAKE ONE CAPSULE BY MOUTH TWICE DAILY 08/23/15  Yes [name], MD  PRESCRIPTION MEDICATION Inhale into the lungs at bedtime. CPAP   Yes Historical Provider, MD  allopurinol (ZYLOPRIM) 100 MG tablet Take 1 tablet (100 mg total) by mouth daily. Patient not taking: Reported on 01/03/2016 10/02/15   [name], MD  colchicine 0.6 MG tablet Take 1 tablet (0.6 mg total) by mouth daily. Patient not taking: Reported on 01/03/2016 10/02/15   [name], MD  cyclobenzaprine (FLEXERIL) 5 MG tablet Take 1 tablet (5 mg total) by mouth 3 (three) times daily as needed for muscle spasms. 05/26/15   Katheren Shams, DO   oxyCODONE-acetaminophen (PERCOCET/ROXICET) 5-325 MG tablet Take 1 tablet by mouth every 4 (four) hours as needed for severe pain. 01/03/16   Isla Pence, MD  predniSONE (STERAPRED UNI-PAK 21 TAB) 10 MG (21) TBPK tablet Take 1 tablet (10 mg total) by mouth daily. Take 6 tabs by mouth daily  for 2 days, then 5 tabs for 2 days, then 4 tabs for 2 days, then 3 tabs for 2 days, 2 tabs for 2 days, then 1 tab by mouth daily for 2 days 01/03/16   Isla Pence, MD   BP 151/89 mmHg  Pulse 68  Temp(Src) 97.7 F (36.5 C) (Oral)  Resp 18  SpO2 97% Physical Exam  Constitutional: He is oriented to person, place, and time. He appears well-developed and well-nourished.  HENT:  Head: Normocephalic and atraumatic.  Right Ear: External ear normal.  Left Ear: External ear normal.  Nose: Nose normal.  Mouth/Throat: Oropharynx is clear and moist.  Eyes: Conjunctivae and EOM are normal. Pupils are equal, round, and reactive to light.  Neck: Normal range of motion. Neck supple.  Cardiovascular: Normal rate, regular rhythm, normal heart sounds and intact distal pulses.   Pulmonary/Chest: Effort normal and breath sounds normal.  Abdominal: Soft. Bowel sounds are normal.  Musculoskeletal: Normal range of motion.  Neurological: He is alert and oriented to person, place, and time.  Skin: Skin is warm and dry.  Psychiatric: He has a normal mood and affect. His behavior is normal. Judgment and thought content normal.  Nursing note and vitals reviewed.   ED Course  Procedures (including critical care time) Labs Review Labs Reviewed  APTT - Abnormal; Notable for the following:    aPTT 42 (*)    All other components within normal limits  CBC - Abnormal; Notable for the following:    Hemoglobin 12.9 (*)    All other components within normal limits  COMPREHENSIVE METABOLIC PANEL - Abnormal; Notable for the following:    Glucose, Bld 127 (*)    All other components within normal limits  I-STAT CHEM 8, ED -  Abnormal; Notable for the following:    Glucose, Bld 126 (*)    All other components within normal limits  PROTIME-INR  DIFFERENTIAL  I-STAT TROPOININ, ED  CBG MONITORING, ED    Imaging Review Dg Chest 2 View  01/03/2016  CLINICAL DATA:  Shortness of breath. Left-sided numbness and left chest pain. EXAM: CHEST  2 VIEW COMPARISON:  06/23/2015 FINDINGS: Both lungs are clear. Heart and mediastinum are within normal limits. Trachea is midline. No large pleural effusions. No acute bone abnormality. IMPRESSION: No active cardiopulmonary disease. Electronically Signed   By: Markus Daft M.D.   On: 01/03/2016 18:26   Ct Head Wo Contrast  01/03/2016  CLINICAL DATA:  Left-sided facial numbness for 1 day EXAM: CT HEAD WITHOUT CONTRAST TECHNIQUE: Contiguous axial images were obtained from the base of the skull through the vertex without intravenous contrast. COMPARISON:  None. FINDINGS: The ventricles are normal in size and configuration. There is no intracranial mass, hemorrhage, extra-axial fluid collection, or midline shift. The gray-white compartments are normal. There is no acute infarct evident. The bony calvarium appears intact. Visualized mastoid air cells are clear. No intraorbital lesions are evident. There is diffuse opacification of the frontal sinuses bilaterally. There is extensive ethmoid sinus disease bilaterally. There is mucosal thickening in the right sphenoid sinus as well as in both maxillary antra, slightly more on the left than on the right. IMPRESSION: Extensive paranasal sinus disease. No intracranial mass, hemorrhage, or focal gray -white compartment lesions/acute appearing infarct. Electronically Signed   By: Lowella Grip III M.D.   On: 01/03/2016 13:11   I have personally reviewed and evaluated these images and lab results as part of my medical decision-making.   EKG Interpretation   Date/Time:  Wednesday Jan 03 2016 12:35:14 EDT Ventricular Rate:  69 PR Interval:  188 QRS  Duration: 98 QT Interval:  412 QTC Calculation: 441 R Axis:   8 Text Interpretation:  Normal sinus rhythm Normal ECG Confirmed by Sameul Tagle  MD, Yailyn Strack ([id number]) on 01/03/2016 5:47:39 PM      MDM  PT'S SX ARE C/W CERVICAL RADICULOPATHY.  THE PT'S SX HAVE IMPROVED AFTER THE DECADRON AND PERCOCET.  PT TOLD THAT HE WILL NEED A MRI IF SX PERSIST.  PT KNOWS TO RETURN IF WORSE. Final diagnoses:  Cervical radiculopathy       Isla Pence, MD 01/03/16 [phone]

## 2016-01-03 NOTE — Discharge Instructions (Signed)
Cervical Radiculopathy Cervical radiculopathy means that a nerve in the neck is pinched or bruised. This can cause pain or loss of feeling (numbness) that runs from your neck to your arm and fingers. HOME CARE Managing Pain  Take over-the-counter and prescription medicines only as told by your doctor.  If directed, put ice on the injured or painful area.  Put ice in a plastic bag.  Place a towel between your skin and the bag.  Leave the ice on for 20 minutes, 2-3 times per day.  If ice does not help, you can try using heat. Take a warm shower or warm bath, or use a heat pack as told by your doctor.  You may try a gentle neck and shoulder massage. Activity  Rest as needed. Follow instructions from your doctor about any activities to avoid.  Do exercises as told by your doctor or physical therapist. General Instructions   If you were given a soft collar, wear it as told by your doctor.  Use a flat pillow when you sleep.  Keep all follow-up visits as told by your doctor. This is important. GET HELP IF:  Your condition does not improve with treatment. GET HELP RIGHT AWAY IF:   Your pain gets worse and is not controlled with medicine.  You lose feeling or feel weak in your hand, arm, face, or leg.  You have a fever.  You have a stiff neck.  You cannot control when you poop or pee (have incontinence).  You have trouble with walking, balance, or talking.   This information is not intended to replace advice given to you by your health care provider. Make sure you discuss any questions you have with your health care provider.   Document Released: 07/11/2011 Document Revised: 04/12/2015 Document Reviewed: 09/15/2014 Elsevier Interactive Patient Education 2016 Elsevier Inc.  

## 2016-01-03 NOTE — ED Notes (Signed)
CBG- 92 

## 2016-01-05 ENCOUNTER — Ambulatory Visit (INDEPENDENT_AMBULATORY_CARE_PROVIDER_SITE_OTHER): Payer: 59 | Admitting: Family Medicine

## 2016-01-05 ENCOUNTER — Encounter: Payer: Self-pay | Admitting: Family Medicine

## 2016-01-05 VITALS — BP 146/87 | HR 73 | Temp 97.9°F | Ht 74.0 in | Wt 341.4 lb

## 2016-01-05 DIAGNOSIS — M5441 Lumbago with sciatica, right side: Secondary | ICD-10-CM

## 2016-01-05 DIAGNOSIS — M47816 Spondylosis without myelopathy or radiculopathy, lumbar region: Secondary | ICD-10-CM | POA: Insufficient documentation

## 2016-01-05 DIAGNOSIS — M4726 Other spondylosis with radiculopathy, lumbar region: Secondary | ICD-10-CM | POA: Diagnosis not present

## 2016-01-05 DIAGNOSIS — M47812 Spondylosis without myelopathy or radiculopathy, cervical region: Secondary | ICD-10-CM | POA: Diagnosis not present

## 2016-01-05 DIAGNOSIS — M5442 Lumbago with sciatica, left side: Secondary | ICD-10-CM | POA: Diagnosis not present

## 2016-01-05 HISTORY — DX: Spondylosis without myelopathy or radiculopathy, cervical region: M47.812

## 2016-01-05 HISTORY — DX: Spondylosis without myelopathy or radiculopathy, lumbar region: M47.816

## 2016-01-05 NOTE — Assessment & Plan Note (Addendum)
Acute on chronic L>R LBP with associated L sciatica. 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 In setting of known chronic LBP with DJD (no prior surgery) - No red flag symptoms. Positive L SLR for radiculopathy  Plan: 1. Continue previous rx Prednisone 12 d taper from ED, last dose 01/15/16, also may take percocet PRN from ED 2. Start home muscle relaxant with Flexeril 10mg  tabs - take 5-10mg  up to TID PRN, titrate up as tolerated 3. May use Tylenol PRN for breakthrough 4. Encouraged use of heating pad 1-2x daily for now then PRN 5. Note out of work x 1 week, out thru Fri 01/12/16 6. Follow-up within 1 week with PCP after prednisone has chance to take effect, consider updating lumbar x-rays in near future, future PT

## 2016-01-05 NOTE — Assessment & Plan Note (Signed)
Likely etiology of cervicalgia with L sided radiculopathy. Treated in ED 5/31. No updated images, last 2012 MRI unremarkable Continue prednisone, pain meds, muscle relaxant Follow-up if not improved, maybe update X-rays C spine

## 2016-01-05 NOTE — Progress Notes (Signed)
Subjective:    Patient ID: [name], male    DOB: 1966-06-15, 50 y.o.   MRN: [id number]  [name] is a 50 y.o. male presenting on 01/05/2016 for Abdominal/Lower leg pain   Patient presents for a same day appointment.   HPI  ED FOLLOW-UP NECK / LOW BACK PAIN / LOWER EXT PAIN: - Chronic known history of known L spine DJD and possible C-spine DJD however review of prior imaging without significant findings in C-spine. 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 Admits prior work injury 2009, felt on fork lift blades about 6 ft ht, chronic low back pain since that time - Seen in ED 2 days ago 5/31 for Neck pain, onset x 2 weeks with left arm numbness and radiating pain, dx cervicalgia with radiculopathy, given percocet and prednisone 12 day taper dose pak, started these with mild relief - Today presents after new onset low back pain, started earlier today around 1100 while at rest, but earlier he was actively working with lifting heavy loads and "pushing up 30-40 lbs with arms raised", now with difficulty with standing up from seated position, describes aching pain in low back with sharp pain radiating 9/10 into anterior groin and into lower legs bilaterally. Improved in seated position with back support. Walking, bending forward worsen pain, not as bad leaning back. - has flexeril, not started using - Admits to intermittent numbness left arm and left leg - Admits radiating pain into bilateral groin, but denies any groin or saddle anesthesia / numbness - Denies any fevers/chills, unintentional weight loss, weakness, loss of control bladder/bowel incontinence or retention  H/o GOUT: - Last flare in left foot in 09/2015 during  hospitalization, treated with indomethicin, has since been off of Allopurinol and no longer on Colchicine (due to not covered by ins)   Social History  Substance Use Topics  . Smoking status: Former Smoker -- 1.00 packs/day for 10 years    Types: Cigarettes    Quit date: 04/02/1994  . Smokeless tobacco: Former Systems developer    Types: Chew     Comment: "chewed in my teens"  . Alcohol Use: 0.0 oz/week    0 Standard drinks or equivalent per week     Comment: "stopped in 1994"    Review of Systems Per HPI unless specifically indicated above     Objective:    BP 146/87 mmHg  Pulse 73  Temp(Src) 97.9 F (36.6 C) (Oral)  Ht 6\' 2"  (1.88 m)  Wt 341 lb 6.4 oz (154.858 kg)  BMI 43.81 kg/m2  Wt Readings from Last 3 Encounters:  01/05/16 341 lb 6.4 oz (154.858 kg)  10/09/15 333 lb 3.2 oz (151.139 kg)  10/02/15 334 lb 1.6 oz (151.547 kg)    Physical Exam  Constitutional: He appears well-developed and well-nourished. No distress.  Obese, well-appearing, comfortable, cooperative  HENT:  Head: Normocephalic and atraumatic.  Neck:  Neck Inspection: Large body habitus difficult to appreciate muscle tone/spasm. No asymmetry Palpation: Non-tender over C-spine, some mild discomfort left > right paraspinal with some spasm. ROM: Full intact active ROM flex and ext, mild discomfort with full neck ext, mild reduced R-rotation by 10-20*, left full active ROM Special Testing: Spurling's positive radiculopathy Right side with pain radiating  down Rightarm Strength: 5/5 bilateral grip, biceps Neurovascular: mild left hand numbness to light touch otherwise most light touch sensation intact   Cardiovascular: Normal rate and intact distal pulses.   Musculoskeletal: Normal range of motion. He exhibits no edema or tenderness.  Low Back Inspection: Large body habitus, no spinal deformity, symmetrical. Palpation: Bilateral lumbar paraspinal muscles mild tender and with hypertonicity/spasm. ROM: Full active ROM  forward flex / back extension, rotation L/R without discomfort Special Testing: Seated Left SLR with L radicular pain. Strength: Bilateral hip flex/ext 5/5, knee flex/ext 5/5, ankle dorsiflex/plantarflex 5/5 Neurovascular: left lower ext mild reduced light touch, but testing on foot intact to light touch. R-patellar and achilles DTR +2 intact. Left patellar +1 mildly reduced. Left achilles +2 intact.  Neurological: He is alert.  Skin: Skin is warm and dry. No rash noted. He is not diaphoretic.  Nursing note and vitals reviewed.      Assessment & Plan:   Problem List Items Addressed This Visit    DJD (degenerative joint disease) of cervical spine    Likely etiology of cervicalgia with L sided radiculopathy. Treated in ED 5/31. No updated images, last 2012 MRI unremarkable Continue prednisone, pain meds, muscle relaxant Follow-up if not improved, maybe update X-rays C spine      Degenerative joint disease (DJD) of lumbar spine    Likely etiology for chronic LBP given prior history of injury and chronic pain. Treat for radiculopathy now with prednisone Future repeat X-rays      Chronic back pain - Primary    Acute on chronic L>R LBP with associated L sciatica. 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 In setting of known chronic LBP with DJD (no prior surgery) - No red flag symptoms. Positive L SLR for radiculopathy  Plan: 1. Continue previous rx Prednisone 12 d taper from ED, last dose 01/15/16, also may take percocet PRN from ED 2. Start home muscle relaxant with Flexeril 10mg  tabs - take 5-10mg  up to TID PRN, titrate up as tolerated 3. May use Tylenol PRN for breakthrough 4. Encouraged use of heating pad 1-2x daily for now then PRN 5. Note out of work x 1 week, out thru Fri 01/12/16 6. Follow-up within 1 week with PCP after prednisone has chance to take effect, consider updating lumbar x-rays in near future, future  PT          No orders of the defined types were placed in this encounter.      Follow up plan: Return in about 1 week (around 01/12/2016) for low back pain with sciatica.  [name], [name], PGY-3

## 2016-01-05 NOTE — Assessment & Plan Note (Signed)
Likely etiology for chronic LBP given prior history of injury and chronic pain. Treat for radiculopathy now with prednisone Future repeat X-rays

## 2016-01-05 NOTE — Patient Instructions (Signed)
Thank you for coming in to clinic today.  1. For your Back Pain - I think that this is due to Muscle Spasms or strain. Your Sciatic Nerve can be affected causing some of your radiation and numbness down your legs. 2. Finish the Prednisone dose pak steroid taper over next 10 days, last day is 01/15/16 3. Start Cyclobenzapine (Flexeril) 10mg  tablets - cut in half for 5mg  at night for muscle relaxant - may make you sedated or sleepy (be careful driving or working on this) if tolerated you can take every 8 hours, half or whole tab 4. May use Tylenol Extra Str 500mg  tabs - may take 1-2 tablets every 6 hours as needed 5. Recommend to start using heating pad on your lower back 1-2x daily for few weeks  This is the same problem in your Neck with arthritis and wear and tear, causing nerve pain down your left arm, pinched nerve. The medicines given in the ED will help both problems.  This pain may take weeks to months to fully resolve, but hopefully it will respond to the medicine initially. All back injuries (small or serious) are slow to heal since we use our back muscles every day. Be careful with turning, twisting, lifting, sitting / standing for prolonged periods, and avoid re-injury.  If your symptoms significantly worsen with more pain, or new symptoms with weakness in one or both legs, new or different shooting leg pains, numbness in legs or groin, loss of control or retention of urine or bowel movements, please call back for advice and you may need to go directly to the Emergency Department.   Please schedule a follow-up appointment with Dr Ardelia Mems within 1 week to follow-up Low Back Pain / Sciatica and determine if can Return to Work  If you have any other questions or concerns, please feel free to call the clinic to contact me. You may also schedule an earlier appointment if necessary.  However, if your symptoms get significantly worse, please go to the Emergency Department to seek immediate  medical attention.  Nobie Putnam, [place]

## 2016-01-11 ENCOUNTER — Ambulatory Visit (INDEPENDENT_AMBULATORY_CARE_PROVIDER_SITE_OTHER): Payer: 59 | Admitting: Family Medicine

## 2016-01-11 VITALS — BP 138/96 | HR 62 | Temp 98.0°F | Ht 74.0 in | Wt 340.0 lb

## 2016-01-11 DIAGNOSIS — M47812 Spondylosis without myelopathy or radiculopathy, cervical region: Secondary | ICD-10-CM | POA: Diagnosis not present

## 2016-01-11 DIAGNOSIS — K219 Gastro-esophageal reflux disease without esophagitis: Secondary | ICD-10-CM | POA: Diagnosis not present

## 2016-01-11 MED ORDER — OMEPRAZOLE 20 MG PO CPDR: 20.0000 mg | DELAYED_RELEASE_CAPSULE | Freq: Two times a day (BID) | ORAL | Status: DC

## 2016-01-11 NOTE — Patient Instructions (Signed)
Sent in refills on your omeprazole Ok to return to work [employer] steroid taper Flexeril as needed  Let us know if symptoms worsen or don't continue getting better  See the handout on how to schedule your colonoscopy. This is an important screening test for colon cancer.   Be well, Dr. Ardelia Mems

## 2016-01-11 NOTE — Progress Notes (Signed)
Date of Visit: 01/11/2016   HPI:  Patient presents to follow up on back/neck pain.  Back/neck pain - still on prednisone taper. Overall getting better each day. Still has some pain down his left arm. Back is overall feeling better. Needs note go to back to work, feels like he can go back now. Denies having fever, saddle anesthesia, lower extremity weakness, or problems with stooling or urination. No preceding injuries.   GERD - needs omeprazole refilled. Doing well on this medication.   ROS: See HPI.  Emigrant: history of GERD, hyperlipidemia, afib, gout  PHYSICAL EXAM: BP 144/85 mmHg  Pulse 62  Temp(Src) 98 F (36.7 C) (Oral)  Ht 6\' 2"  (1.88 m)  Wt 340 lb (154.223 kg)  BMI 43.63 kg/m2 Gen: NAD, pleasant, cooperative HEENT: normocephalic, atraumatic, moist mucous membranes  Heart: regular rate and rhythm no murmur Lungs: clear to auscultation bilaterally, normal work of breathing  Neuro: alert grossly nonfocal speech normal Ext: No appreciable lower extremity edema bilaterally, full strength bilateral upper & lower extremities   ASSESSMENT/PLAN:  Health maintenance:  -discussed with patient need for HIV, hep C labs - prefers to wait until another time -also recommended getting updated A1c since cbg's have been elevated, he prefers to wait. Last A1c 5.6 in July 2016 -handout given on colonoscopy  GASTROESOPHAGEAL REFLUX, NO ESOPHAGITIS Stable, refill omeprazole  DJD (degenerative joint disease) of cervical spine Improved, finish steroid taper & use flexeril as needed Ok to go back to work Follow up as needed    FOLLOW UP: Follow up as needed if symptoms worsen or fail to improve.    [place]ath. Ardelia Mems, [place]

## 2016-01-12 DIAGNOSIS — R002 Palpitations: Secondary | ICD-10-CM | POA: Insufficient documentation

## 2016-01-15 ENCOUNTER — Encounter: Payer: 59 | Admitting: Internal Medicine

## 2016-01-16 NOTE — Assessment & Plan Note (Signed)
Improved, finish steroid taper & use flexeril as needed Ok to go back to work Follow up as needed

## 2016-01-16 NOTE — Assessment & Plan Note (Signed)
Stable, refill omeprazole 

## 2016-01-28 ENCOUNTER — Other Ambulatory Visit: Payer: Self-pay | Admitting: Family Medicine

## 2016-01-31 NOTE — Telephone Encounter (Signed)
2nd request.  Martin, Wayne L, RN  

## 2016-02-26 ENCOUNTER — Encounter: Payer: 59 | Admitting: Internal Medicine

## 2016-03-20 ENCOUNTER — Encounter: Payer: Self-pay | Admitting: Internal Medicine

## 2016-03-20 ENCOUNTER — Ambulatory Visit (INDEPENDENT_AMBULATORY_CARE_PROVIDER_SITE_OTHER): Payer: 59 | Admitting: Internal Medicine

## 2016-03-20 ENCOUNTER — Encounter: Payer: Self-pay | Admitting: *Deleted

## 2016-03-20 VITALS — BP 140/94 | HR 70 | Ht 74.0 in | Wt 340.4 lb

## 2016-03-20 DIAGNOSIS — G4733 Obstructive sleep apnea (adult) (pediatric): Secondary | ICD-10-CM

## 2016-03-20 DIAGNOSIS — I480 Paroxysmal atrial fibrillation: Secondary | ICD-10-CM

## 2016-03-20 DIAGNOSIS — I10 Essential (primary) hypertension: Secondary | ICD-10-CM

## 2016-03-20 NOTE — Patient Instructions (Signed)
Medication Instructions:  Your physician recommends that you continue on your current medications as directed. Please refer to the Current Medication list given to you today.   Labwork: Your physician recommends that you return for lab work today: CBC/BMP/MAG   Testing/Procedures: None ordered   Follow-Up: Your physician recommends that you schedule a follow-up appointment in: 3 months with afib clinic   Any Other Special Instructions Will Be Listed Below (If Applicable).     If you need a refill on your cardiac medications before your next appointment, please call your pharmacy.

## 2016-03-21 LAB — CBC WITH DIFFERENTIAL/PLATELET
BASOS ABS: 0 {cells}/uL (ref 0–200)
Basophils Relative: 0 %
EOS PCT: 3 %
Eosinophils Absolute: 201 cells/uL (ref 15–500)
HEMATOCRIT: 42.6 % (ref 38.5–50.0)
HEMOGLOBIN: 14.2 g/dL (ref 13.2–17.1)
LYMPHS ABS: 2546 {cells}/uL (ref 850–3900)
LYMPHS PCT: 38 %
MCH: 27.5 pg (ref 27.0–33.0)
MCHC: 33.3 g/dL (ref 32.0–36.0)
MCV: 82.4 fL (ref 80.0–100.0)
MONO ABS: 536 {cells}/uL (ref 200–950)
MPV: 10 fL (ref 7.5–12.5)
Monocytes Relative: 8 %
NEUTROS PCT: 51 %
Neutro Abs: 3417 cells/uL (ref 1500–7800)
Platelets: 246 10*3/uL (ref 140–400)
RBC: 5.17 MIL/uL (ref 4.20–5.80)
RDW: 14.1 % (ref 11.0–15.0)
WBC: 6.7 10*3/uL (ref 3.8–10.8)

## 2016-03-21 LAB — BASIC METABOLIC PANEL
BUN: 9 mg/dL (ref 7–25)
CALCIUM: 9.4 mg/dL (ref 8.6–10.3)
CO2: 27 mmol/L (ref 20–31)
Chloride: 103 mmol/L (ref 98–110)
Creat: 0.97 mg/dL (ref 0.70–1.33)
GLUCOSE: 61 mg/dL — AB (ref 65–99)
Potassium: 4.6 mmol/L (ref 3.5–5.3)
Sodium: 139 mmol/L (ref 135–146)

## 2016-03-21 LAB — MAGNESIUM: MAGNESIUM: 2.2 mg/dL (ref 1.5–2.5)

## 2016-03-21 NOTE — Progress Notes (Signed)
Electrophysiology Office Note   Date:  03/21/2016   ID:  [name], DOB Oct 13, 1965, MRN [id number]  PCP:  Chrisandra Netters, MD  Cardiologist:  Dr Acie Fredrickson Primary Electrophysiologist: Thompson Grayer, MD    Chief Complaint  Patient presents with  . Atrial Fibrillation     History of Present Illness: [name] is a 50 y.o. male who presents today for electrophysiology evaluation.  He seems to be doing reasonably well.  His AF is controlled.   He continues to try to make lifestyle change with diet modification and exercise but without great success.  He has occasional headaches.  He missed the past 2 days from work and requests an excuse to return to work.  Today, he denies symptoms of palpitations,  lower extremity edema, claudication, dizziness, presyncope, syncope, bleeding, or neurologic sequela. The patient is tolerating medications without difficulties and is otherwise without complaint today.    Past Medical History:  Diagnosis Date  . Arthritis   . Chest pain 09/12/10   Myoview-attenuation defiect in the inferior myocardium, no ischemia, low risk scan  . GERD (gastroesophageal reflux disease)   . Gout   . History of MRI of cervical spine 09/25/10   normal; left shoulder normal as well  . Hypertension   . Migraine    "about monthly" (03/03/2015)  . Morbid obesity ([place])   . OSA on CPAP   . Paroxysmal atrial fibrillation ([facility]) 7/15   chads2vasc score is 1   Past Surgical History:  Procedure Laterality Date  . CARDIAC CATHETERIZATION  05/2005   "no blockage"  . KNEE ARTHROSCOPY Right 02/2013     Current Outpatient Prescriptions  Medication Sig Dispense Refill  . cyclobenzaprine (FLEXERIL) 5 MG tablet Take 1 tablet (5 mg total) by mouth 3 (three) times daily as needed for muscle spasms. 30 tablet 0  . dofetilide (TIKOSYN) 500 MCG capsule Take 1 capsule (500 mcg total) by mouth 2 (two) times daily. 180 capsule 3  . ELIQUIS 5 MG TABS tablet TAKE ONE TABLET BY MOUTH TWICE  DAILY 60 tablet 5  . ibuprofen (ADVIL,MOTRIN) 200 MG tablet Take 800 mg by mouth every 6 (six) hours as needed (pain). Reported on 01/05/2016    . Omega-3 Fatty Acids (FISH OIL) 1000 MG CAPS Take 2,000 mg by mouth daily.     Marland Kitchen omeprazole (PRILOSEC) 20 MG capsule Take 1 capsule (20 mg total) by mouth 2 (two) times daily. 60 capsule 5  . PRESCRIPTION MEDICATION Inhale into the lungs at bedtime. CPAP     No current facility-administered medications for this visit.     Allergies:   Hydrochlorothiazide-propranolol [propranolol-hctz] and Penicillins   Social History:  The patient  reports that he quit smoking about 21 years ago. His smoking use included Cigarettes. He has a 10.00 pack-year smoking history. He has quit using smokeless tobacco. His smokeless tobacco use included Chew. He reports that he drinks alcohol. He reports that he uses drugs, including Marijuana.   Family History:  The patient's  family history includes Atrial fibrillation (age of onset: 57) in his mother.    ROS:  Please see the history of present illness.   All other systems are reviewed and negative.    PHYSICAL EXAM: VS:  BP (!) 140/94   Pulse 70   Ht 6\' 2"  (1.88 m)   Wt (!) 340 lb 6.4 oz (154.4 kg)   BMI 43.70 kg/m  , BMI Body mass index is 43.7 kg/m. GEN: overweight, in no acute  distress  HEENT: normal  Neck: no JVD, carotid bruits, or masses Cardiac: RRR; no murmurs, rubs, or gallops,no edema  Respiratory:  clear to auscultation bilaterally, normal work of breathing GI: soft, nontender, nondistended, + BS MS: no deformity or atrophy  Skin: warm and dry  Neuro:  Strength and sensation are intact Psych: euthymic mood, full affect  EKG:  EKG is ordered today. The ekg ordered today shows sinus rhythm, QT is stable   Recent Labs: 01/03/2016: ALT 27 03/20/2016: BUN 9; Creat 0.97; Hemoglobin 14.2; Magnesium 2.2; Platelets 246; Potassium 4.6; Sodium 139    Lipid Panel     Component Value Date/Time   CHOL  173 03/04/2015 0615   TRIG 442 (H) 03/04/2015 0615   HDL 25 (L) 03/04/2015 0615   CHOLHDL 6.9 03/04/2015 0615   VLDL UNABLE TO CALCULATE IF TRIGLYCERIDE OVER 400 mg/dL 03/04/2015 0615   LDLCALC UNABLE TO CALCULATE IF TRIGLYCERIDE OVER 400 mg/dL 03/04/2015 0615     Wt Readings from Last 3 Encounters:  03/20/16 (!) 340 lb 6.4 oz (154.4 kg)  01/11/16 (!) 340 lb (154.2 kg)  01/05/16 (!) 341 lb 6.4 oz (154.9 kg)      Other studies Reviewed: Additional studies/ records that were reviewed today include:  AF clinic notes are reviewed  ASSESSMENT AND PLAN:  1.  Paroxysmal atrial fibrillation Seems to be doing well with tikosyn and eliquis Lifestyle changes would be most beneficial No medicines changes Not a good ablation candidate  2. HTN Stable No change required today  3. Obesity Body mass index is 43.7 kg/m. Lifestyle modification is essential to our ability to maintain sinus long term  4. OSA Compliance with CPAP is encouraged  Follow-up with Roderic Palau NP every 3 months I will see as needed going forward  Current medicines are reviewed at length with the patient today.   The patient does not have concerns regarding his medicines.  The following changes were made today:  none  Signed, Thompson Grayer, MD    [facility] [place] [address] [phone] (office) [phone] (fax)

## 2016-04-03 ENCOUNTER — Emergency Department (HOSPITAL_COMMUNITY): Payer: 59

## 2016-04-03 ENCOUNTER — Emergency Department (HOSPITAL_COMMUNITY)
Admission: EM | Admit: 2016-04-03 | Discharge: 2016-04-03 | Disposition: A | Discharge status: Home / Self Care | Payer: 59 | Attending: Emergency Medicine | Admitting: Emergency Medicine

## 2016-04-03 ENCOUNTER — Encounter (HOSPITAL_COMMUNITY): Payer: Self-pay | Admitting: Emergency Medicine

## 2016-04-03 DIAGNOSIS — Y929 Unspecified place or not applicable: Secondary | ICD-10-CM | POA: Insufficient documentation

## 2016-04-03 DIAGNOSIS — J019 Acute sinusitis, unspecified: Secondary | ICD-10-CM | POA: Insufficient documentation

## 2016-04-03 DIAGNOSIS — I10 Essential (primary) hypertension: Secondary | ICD-10-CM | POA: Diagnosis not present

## 2016-04-03 DIAGNOSIS — S0990XA Unspecified injury of head, initial encounter: Secondary | ICD-10-CM

## 2016-04-03 DIAGNOSIS — Z87891 Personal history of nicotine dependence: Secondary | ICD-10-CM | POA: Insufficient documentation

## 2016-04-03 DIAGNOSIS — Z79899 Other long term (current) drug therapy: Secondary | ICD-10-CM | POA: Insufficient documentation

## 2016-04-03 DIAGNOSIS — H6092 Unspecified otitis externa, left ear: Secondary | ICD-10-CM | POA: Diagnosis not present

## 2016-04-03 DIAGNOSIS — Z7901 Long term (current) use of anticoagulants: Secondary | ICD-10-CM | POA: Diagnosis not present

## 2016-04-03 DIAGNOSIS — W228XXA Striking against or struck by other objects, initial encounter: Secondary | ICD-10-CM | POA: Insufficient documentation

## 2016-04-03 DIAGNOSIS — Y939 Activity, unspecified: Secondary | ICD-10-CM | POA: Diagnosis not present

## 2016-04-03 DIAGNOSIS — Y990 Civilian activity done for income or pay: Secondary | ICD-10-CM | POA: Diagnosis not present

## 2016-04-03 DIAGNOSIS — J018 Other acute sinusitis: Secondary | ICD-10-CM

## 2016-04-03 MED ORDER — DOXYCYCLINE HYCLATE 100 MG PO CAPS: 100.0000 mg | ORAL_CAPSULE | Freq: Two times a day (BID) | ORAL | 0 refills | Status: DC

## 2016-04-03 MED ORDER — NEOMYCIN-POLYMYXIN-HC 3.5-10000-1 OT SUSP: 4.0000 [drp] | Freq: Four times a day (QID) | OTIC | 0 refills | Status: DC

## 2016-04-03 NOTE — ED Provider Notes (Signed)
[facility] DEPT Provider Note   CSN: [id number] Arrival date & time: 04/03/16  0650     History   Chief Complaint Chief Complaint  Patient presents with  . Otalgia    HPI [name] is a 50 y.o. male.  Patient is a 50 year old male with history of paroxysmal atrial fibrillation, obesity, and obstructive sleep apnea. He presents for evaluation of ear pain for the past several days. This morning he woke up and there was blood on the pillow. He denies any loss of hearing. He denies any fevers or chills.  His wife reports that he has been having headaches more frequently over the past several days. The patient reports having struck his head on a metal bar at work prior to the onset of these headaches. Denies any loss of consciousness or visual disturbances.   The history is provided by the patient.  Otalgia  This is a new problem. Episode onset: Several days ago. There is pain in the left ear. The problem occurs constantly. The problem has been gradually worsening. There has been no fever. The pain is moderate. Pertinent negatives include no hearing loss. Associated symptoms comments: Bleeding from ear.    Past Medical History:  Diagnosis Date  . Arthritis   . Chest pain 09/12/10   Myoview-attenuation defiect in the inferior myocardium, no ischemia, low risk scan  . GERD (gastroesophageal reflux disease)   . Gout   . History of MRI of cervical spine 09/25/10   normal; left shoulder normal as well  . Hypertension   . Migraine    "about monthly" (03/03/2015)  . Morbid obesity ([place])   . OSA on CPAP   . Paroxysmal atrial fibrillation ([facility]) 7/15   chads2vasc score is 1    Patient Active Problem List   Diagnosis Date Noted  . 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 Acute gout   . Dizziness 03/21/2015  . Shortness of breath 03/21/2015  . OSA on CPAP   . Paroxysmal atrial fibrillation  ([facility])   . Palpitations   . Chest pain 03/03/2015  . Left knee pain 10/13/2014  . Atrial fibrillation ([facility]) 03/14/2014  . Right knee pain 11/10/2012  . Lateral epicondylitis  of elbow 04/13/2012  . Intermittent drowsiness 11/25/2011  . Left hip pain 06/17/2011  . OSA (obstructive sleep apnea) 05/17/2011  . Chronic back pain 10/29/2010  . INSOMNIA 09/19/2008  . GOUT 05/13/2007  . Hyperlipidemia 10/02/2006  . Morbid obesity ([facility]) 10/02/2006  . MIGRAINE, UNSPEC., W/O INTRACTABLE MIGRAINE 10/02/2006  . HYPERTENSION, BENIGN SYSTEMIC 10/02/2006  . GASTROESOPHAGEAL REFLUX, NO ESOPHAGITIS 10/02/2006    Past Surgical History:  Procedure Laterality Date  . CARDIAC CATHETERIZATION  05/2005   "no blockage"  . KNEE ARTHROSCOPY Right 02/2013       Home Medications    Prior to Admission medications   Medication Sig Start Date End Date Taking? Authorizing Provider  cyclobenzaprine (FLEXERIL) 5 MG tablet Take 1 tablet (5 mg total) by mouth 3 (three) times daily as needed for muscle spasms. 05/26/15   Katheren Shams, DO  dofetilide (TIKOSYN) 500 MCG capsule Take 1 capsule (500 mcg total) by mouth 2 (two) times daily. 09/14/15   Amber Sena Slate, NP  ELIQUIS 5 MG TABS tablet TAKE ONE TABLET BY MOUTH TWICE DAILY 02/01/16   [name], MD  ibuprofen (ADVIL,MOTRIN) 200 MG tablet Take 800 mg by mouth every 6 (six) hours  as needed (pain). Reported on 01/05/2016    Historical Provider, MD  Omega-3 Fatty Acids (FISH OIL) 1000 MG CAPS Take 2,000 mg by mouth daily.     Historical Provider, MD  omeprazole (PRILOSEC) 20 MG capsule Take 1 capsule (20 mg total) by mouth 2 (two) times daily. 01/11/16   [name], MD  PRESCRIPTION MEDICATION Inhale into the lungs at bedtime. CPAP    Historical Provider, MD    Family History Family History  Problem Relation Age of Onset  . Atrial fibrillation Mother 48    had a stroke    Social History Social History  Substance Use Topics  . Smoking status:  Former Smoker    Packs/day: 1.00    Years: 10.00    Types: Cigarettes    Quit date: 04/02/1994  . Smokeless tobacco: Former Systems developer    Types: Chew     Comment: "chewed in my teens"  . Alcohol use No     Comment: "stopped in 1994"     Allergies   Hydrochlorothiazide-propranolol [propranolol-hctz] and Penicillins   Review of Systems Review of Systems  HENT: Positive for ear pain. Negative for hearing loss.   All other systems reviewed and are negative.    Physical Exam Updated Vital Signs BP 127/84 (BP Location: Left Arm)   Pulse 67   Temp 97.8 F (36.6 C) (Oral)   Resp 18   Ht 6\' 2"  (1.88 m)   Wt (!) 335 lb (152 kg)   SpO2 97%   BMI 43.01 kg/m   Physical Exam  Constitutional: He is oriented to person, place, and time. He appears well-developed and well-nourished. No distress.  HENT:  Head: Normocephalic and atraumatic.  Mouth/Throat: Oropharynx is clear and moist.  Bilateral TMs are clear. There is dried blood in the left ear canal. There is no active bleeding or purulent drainage.  Eyes: EOM are normal. Pupils are equal, round, and reactive to light.  Neck: Normal range of motion. Neck supple.  Cardiovascular: Normal rate and regular rhythm.  Exam reveals no friction rub.   No murmur heard. Pulmonary/Chest: Effort normal and breath sounds normal. No respiratory distress. He has no wheezes. He has no rales.  Abdominal: Soft. Bowel sounds are normal. He exhibits no distension. There is no tenderness.  Musculoskeletal: Normal range of motion. He exhibits no edema.  Neurological: He is alert and oriented to person, place, and time. No cranial nerve deficit. Coordination normal.  Skin: Skin is warm and dry. He is not diaphoretic.  Nursing note and vitals reviewed.    ED Treatments / Results  Labs (all labs ordered are listed, but only abnormal results are displayed) Labs Reviewed - No data to display  EKG  EKG Interpretation None       Radiology No results  found.  Procedures Procedures (including critical care time)  Medications Ordered in ED Medications - No data to display   Initial Impression / Assessment and Plan / ED Course  I have reviewed the triage vital signs and the nursing notes.  Pertinent labs & imaging results that were available during my care of the patient were reviewed by me and considered in my medical decision making (see chart for details).  Clinical Course    Patient presents with complaints of ear pain for 1 week. He noticed blood on the pillow this morning and bleeding from his left ear. His TM appears clear and I see no evidence for perforation. There is blood in the ear  canal that I suspect is related to an otitis externa and the fact that he is taking eliquis for atrial fibrillation.  He does report striking his head several days ago and has been having headaches since that time. For this reason, a CT scan was obtained which was negative for intracranial hemorrhage. It did show extensive paranasal sinus disease for which she will be prescribed an antibiotic. I will also provide a prescription for Cortisporin which he can use in his ear for what I suspect is an otitis externa.  Final Clinical Impressions(s) / ED Diagnoses   Final diagnoses:  None    New Prescriptions New Prescriptions   No medications on file     Veryl Speak, MD 04/03/16 [phone]

## 2016-04-03 NOTE — ED Triage Notes (Signed)
Pt arrives from home reporting 4-5 days of bilat ear pain with blood noted in L ear this am.  Pt reports nasal congestion, denies fever, chills, cough.

## 2016-04-03 NOTE — ED Notes (Signed)
Patient transported to CT 

## 2016-04-03 NOTE — Discharge Instructions (Signed)
Cortisporin and doxycycline as prescribed.  Return to the emergency department if your symptoms significantly worsen or change.

## 2016-05-06 ENCOUNTER — Encounter: Payer: Self-pay | Admitting: Student

## 2016-05-06 ENCOUNTER — Ambulatory Visit
Admission: RE | Admit: 2016-05-06 | Discharge: 2016-05-06 | Disposition: A | Discharge status: Home / Self Care | Payer: 59 | Source: Ambulatory Visit | Attending: Student | Admitting: Student

## 2016-05-06 ENCOUNTER — Ambulatory Visit (INDEPENDENT_AMBULATORY_CARE_PROVIDER_SITE_OTHER): Payer: 59 | Admitting: Student

## 2016-05-06 VITALS — BP 155/91 | HR 66 | Temp 98.1°F | Ht 74.0 in | Wt 339.8 lb

## 2016-05-06 DIAGNOSIS — M722 Plantar fascial fibromatosis: Secondary | ICD-10-CM | POA: Diagnosis not present

## 2016-05-06 DIAGNOSIS — M25561 Pain in right knee: Secondary | ICD-10-CM | POA: Diagnosis not present

## 2016-05-06 MED ORDER — METHYLPREDNISOLONE ACETATE 40 MG/ML IJ SUSP
40.0000 mg | Freq: Once | INTRAMUSCULAR | Status: AC
  Administered 2016-05-06: 40 mg via INTRAMUSCULAR

## 2016-05-06 NOTE — Patient Instructions (Signed)
It was great seeing you today! We have addressed the following issues today  1. Right knee pain: This is likely due to osteoarthritis. We have given you a steroid injection today. Hopefully this will help with the pain. I have also ordered an x-ray of your knee 2. Left hip pain: This is likely plantar fascitis, which is an inflammation of a tendon. I recommended rolling the left foot on a bottle of cold water to help with the pain. If you pain won't improve, please come back and see [place].     If we did any lab work today, and the results require attention, either me or my nurse will get in touch with you. If everything is normal, you will get a letter in mail. If you don't hear from [place] in two weeks, please give [place] a call. Otherwise, I look forward to talking with you again at our next visit. If you have any questions or concerns before then, please call the clinic at [phone].  Please bring all your medications to every doctors visit   Sign up for My Chart to have easy access to your labs results, and communication with your Primary care physician.    Please check-out at the front desk before leaving the clinic.   Take Care,     Plantar Fasciitis Plantar fasciitis is a painful foot condition that affects the heel. It occurs when the band of tissue that connects the toes to the heel bone (plantar fascia) becomes irritated. This can happen after exercising too much or doing other repetitive activities (overuse injury). The pain from plantar fasciitis can range from mild irritation to severe pain that makes it difficult for you to walk or move. The pain is usually worse in the morning or after you have been sitting or lying down for a while. CAUSES This condition may be caused by:  Standing for long periods of time.  Wearing shoes that do not fit.  Doing high-impact activities, including running, aerobics, and ballet.  Being overweight.  Having an abnormal way of walking  (gait).  Having tight calf muscles.  Having high arches in your feet.  Starting a new athletic activity. SYMPTOMS The main symptom of this condition is heel pain. Other symptoms include:  Pain that gets worse after activity or exercise.  Pain that is worse in the morning or after resting.  Pain that goes away after you walk for a few minutes. DIAGNOSIS This condition may be diagnosed based on your signs and symptoms. Your health care provider will also do a physical exam to check for:  A tender area on the bottom of your foot.  A high arch in your foot.  Pain when you move your foot.  Difficulty moving your foot. You may also need to have imaging studies to confirm the diagnosis. These can include:  X-rays.  Ultrasound.  MRI. TREATMENT  Treatment for plantar fasciitis depends on the severity of the condition. Your treatment may include:  Rest, ice, and over-the-counter pain medicines to manage your pain.  Exercises to stretch your calves and your plantar fascia.  A splint that holds your foot in a stretched, upward position while you sleep (night splint).  Physical therapy to relieve symptoms and prevent problems in the future.  Cortisone injections to relieve severe pain.  Extracorporeal shock wave therapy (ESWT) to stimulate damaged plantar fascia with electrical impulses. It is often used as a last resort before surgery.  Surgery, if other treatments have not  worked after 12 months. HOME CARE INSTRUCTIONS  Take medicines only as directed by your health care provider.  Avoid activities that cause pain.  Roll the bottom of your foot over a bag of ice or a bottle of cold water. Do this for 20 minutes, 3-4 times a day.  Perform simple stretches as directed by your health care provider.  Try wearing athletic shoes with air-sole or gel-sole cushions or soft shoe inserts.  Wear a night splint while sleeping, if directed by your health care provider.  Keep all  follow-up appointments with your health care provider. PREVENTION   Do not perform exercises or activities that cause heel pain.  Consider finding low-impact activities if you continue to have problems.  Lose weight if you need to. The best way to prevent plantar fasciitis is to avoid the activities that aggravate your plantar fascia. SEEK MEDICAL CARE IF:  Your symptoms do not go away after treatment with home care measures.  Your pain gets worse.  Your pain affects your ability to move or do your daily activities.   This information is not intended to replace advice given to you by your health care provider. Make sure you discuss any questions you have with your health care provider.   Document Released: 04/16/2001 Document Revised: 04/12/2015 Document Reviewed: 06/01/2014 Elsevier Interactive Patient Education [employer].

## 2016-05-06 NOTE — Progress Notes (Signed)
   Subjective:    Patient ID: [name] is a 50 y.o. old male.  HPI #Right knee pain: this has been going on for two weeks. Thought it was because of "the rain". Has been using ice which helped a little bit. His knee "locks up" when he walks. He also noted popping sounds when he walks. Pain circumferential around his knee. Denies trauma, fall, swelling or fever. Had surgery in right knee in 02/2013 at [facility]. The surgery was for "torn meniscus".  Has tried upto 4 tablets of ibuprofen 200 mg as needed up to three times a day, for two weeks but not everyday.   #Left heel pain: sharp. Pain usually when he started walking. Eases after walking some distance. No trauma or injury. No swelling.   PMH: reviewed  Review of Systems Per HPI Objective:   Vitals:   05/06/16 1433  BP: (!) 155/91  Pulse: 66  Temp: 98.1 F (36.7 C)  TempSrc: Oral  Weight: (!) 339 lb 12.8 oz (154.1 kg)  Height: 6\' 2"  (1.88 m)    GEN: appears well, no apparent distress. CVS: no edema in both extremities RESP: no increased work of breathing MSK: Both knees appear symmetric.  Right knee: without apparent swelling, skin erythema or increased warmth. Full range of motion. Positive for circumferential joint line tenderness. Positive for crepitus. Negative for effusion. Varus and valgus signs negative. Negative for anterior and posterior drawer signs.  Left heel: Without apparent swelling, skin erythema or increased warmth. Negative for pain with dorsiflexion. No point tenderness. SKIN: No apparent lesion NEURO: alert and oriented appropriately, no gross defecits  PSYCH: appropriate mood and affect   Procedure:  Injection of right knee Consent obtained and verified. Time-out conducted. Noted no overlying erythema, induration, or other signs of local infection. Patient not on anticoagulants  Skin prepped in a sterile fashion. Topical analgesic spray: Ethyl chloride. Injection completed without difficulty with  Solumedrol 80 mg (1cc) and Lidocaine 1% (4cc). Pain immediately improved suggesting accurate placement of the medication. Advised to call if fevers/chills, erythema, induration, drainage, or persistent bleeding.    Assessment & Plan:  Right knee pain Likely osteoarthritis. Exam remarkable for joint line tenderness and crepitus. He had history of meniscal tear in 2014 and had surgical debridement. He has no fever or systemic symptoms to suggest septic joints.  -Steroid injection -Right knee x-ray (4 views)  Plantar fasciitis of left foot Symptoms suggestive for plantar fasciitis. Recommended rolling his foot over the cold water bottle and stretching. If no improvement, can consider steroid injection  Patient declined flu shot today

## 2016-05-07 ENCOUNTER — Encounter: Payer: Self-pay | Admitting: Student

## 2016-05-07 DIAGNOSIS — M722 Plantar fascial fibromatosis: Secondary | ICD-10-CM | POA: Insufficient documentation

## 2016-05-07 HISTORY — DX: Plantar fascial fibromatosis: M72.2

## 2016-05-07 NOTE — Assessment & Plan Note (Signed)
Symptoms suggestive for plantar fasciitis. Recommended rolling his foot over the cold water bottle and stretching. If no improvement, can consider steroid injection

## 2016-05-07 NOTE — Progress Notes (Signed)
X-ray suggestive for osteoarthritis. Since result letter to the patient

## 2016-05-07 NOTE — Assessment & Plan Note (Addendum)
Likely osteoarthritis. Exam remarkable for joint line tenderness and crepitus. He had history of meniscal tear in 2014 and had surgical debridement. He has no fever or systemic symptoms to suggest septic joints.  -Steroid injection -Right knee x-ray (4 views)

## 2016-05-10 ENCOUNTER — Telehealth: Payer: Self-pay | Admitting: Family Medicine

## 2016-05-10 NOTE — Telephone Encounter (Signed)
Would like result from mondays xray. Ok to leave message on phone. steriod shot didn't seem to help any

## 2016-05-13 ENCOUNTER — Telehealth: Payer: Self-pay | Admitting: Family Medicine

## 2016-05-13 NOTE — Telephone Encounter (Signed)
Pt called because he is still in pain and his knee is still locking up. He was wondering if anything will be called in for that. Also his gout medication is not covered by his insurance and would like to have Indomethacin is covered and would like this called in. jw

## 2016-05-13 NOTE — Telephone Encounter (Signed)
Also responding to the following separate phone note:  "Would like result from [facility]. Ok to leave message on phone. steriod shot didn't seem to help any"  Please advise patient that: -xray showed some arthritis -Would recommend he take tylenol, this is a good arthritis medicine -If he needs stronger medication will need to be seen again, also to discuss further evaluation. May need MRI of knee. -Which gout medicine is he referring to not being covered by insurance?  Will also copy Dr. Cyndia Skeeters who saw him last week.  Thanks, [name], MD

## 2016-05-13 NOTE — Telephone Encounter (Signed)
See separate phone note. Trudee Chirino J Yeily Link, MD  

## 2016-05-14 NOTE — Telephone Encounter (Signed)
Attempted to call patient twice. Patient doesn't answer his phone. He has no voicemail either.  Can someone call him in the morning and read Dr. Lennie Odor note to him.  Thank you! [name]

## 2016-05-15 NOTE — Telephone Encounter (Signed)
Pt is returning the doctors call. Please call again when possible. jw

## 2016-05-15 NOTE — Telephone Encounter (Signed)
Called again, no answer, left message on voicemail asking that patient call back for message from MD.

## 2016-05-16 NOTE — Telephone Encounter (Signed)
Patient informed of message from MD, appointment scheduled with PCP as patient thinks he will need something stronger for pain since he has to stand on concrete at work. Also patient states that the gout medication that his insurance wont cover is the colchicine. Will forward to PCP.

## 2016-05-16 NOTE — Telephone Encounter (Signed)
Called patient again and left another voicemail for him to call back.

## 2016-05-16 NOTE — Telephone Encounter (Signed)
Called again (see separate phone note). Will close this note and document in refill encounter from 10/9.

## 2016-05-17 NOTE — Telephone Encounter (Signed)
Tawni Levy talk with him about the colchicine when I see him next week.  [name], MD

## 2016-05-20 ENCOUNTER — Encounter: Payer: Self-pay | Admitting: Family Medicine

## 2016-05-20 ENCOUNTER — Ambulatory Visit (INDEPENDENT_AMBULATORY_CARE_PROVIDER_SITE_OTHER): Payer: 59 | Admitting: Family Medicine

## 2016-05-20 VITALS — BP 128/90 | HR 78 | Temp 98.3°F | Ht 74.0 in | Wt 337.0 lb

## 2016-05-20 DIAGNOSIS — M25561 Pain in right knee: Secondary | ICD-10-CM

## 2016-05-20 DIAGNOSIS — Z1159 Encounter for screening for other viral diseases: Secondary | ICD-10-CM | POA: Diagnosis not present

## 2016-05-20 DIAGNOSIS — E669 Obesity, unspecified: Secondary | ICD-10-CM

## 2016-05-20 DIAGNOSIS — M109 Gout, unspecified: Secondary | ICD-10-CM

## 2016-05-20 DIAGNOSIS — Z23 Encounter for immunization: Secondary | ICD-10-CM

## 2016-05-20 DIAGNOSIS — I480 Paroxysmal atrial fibrillation: Secondary | ICD-10-CM

## 2016-05-20 DIAGNOSIS — E785 Hyperlipidemia, unspecified: Secondary | ICD-10-CM

## 2016-05-20 DIAGNOSIS — Z114 Encounter for screening for human immunodeficiency virus [HIV]: Secondary | ICD-10-CM

## 2016-05-20 LAB — LIPID PANEL
CHOLESTEROL: 181 mg/dL (ref 125–200)
HDL: 28 mg/dL — AB (ref >=40)
LDL CALC: 98 mg/dL (ref <130)
TRIGLYCERIDES: 277 mg/dL — AB (ref <150)
Total CHOL/HDL Ratio: 6.5 Ratio — ABNORMAL HIGH (ref <=5.0)
VLDL: 55 mg/dL — AB (ref <30)

## 2016-05-20 NOTE — Progress Notes (Signed)
Date of Visit: 05/20/2016   HPI:  Patient presents to follow up on knee pain.  Knee pain - present for 3 weeks. Started suddenly. No known injuries. Has had knee surgery/arthroscopy on this knee in the past. Pain is located on medial aspect of R knee. Seen here at [facility] on 10/2 by Dr. Cyndia Skeeters and received corticosteroid intraarticular injection. No significant relief with this. Pain is on and off, day to day. Has taken some ibuprofen some for this.  Gout - recently had gout flare of L first toe. Noted it happened after drinking a large soda, has quit soda since then. Took ibuprofen for this. Main reason he's bring it up today is that he wants to know if he can get indomethacin for it in the future. Cannot afford colchicine. Is not on allopurinol presently.   Atrial fibrillation - compliant with tikosyn, managed by cardiology. Also compliant with eliquis. No bleeding from this. Recent CBC with stable Hgb.   ROS: See HPI.  [place]: GERD, gout, afib, hyperlipidemia, R knee pain, hypertension, morbid obesity, OSA  PHYSICAL EXAM: BP 128/90   Pulse 78   Temp 98.3 F (36.8 C) (Oral)   Ht 6\' 2"  (1.88 m)   Wt (!) 337 lb (152.9 kg)   SpO2 97%   BMI 43.27 kg/m  Gen: NAD, pleasant, cooperative HEENT: normocephalic, atraumatic, moist mucous membranes  Ext: R knee without obvious effusion, erythema, or warmth. + crepitus with flexion & extension. Medial joint line tenderness present. Patella normally mobile. Full strength RLE.   ASSESSMENT/PLAN:  Health maintenance:  -hep C & HIV antibody oday with labs -flu shot given today   Right knee pain No relief with knee steroid injection last visit Will schedule low dose ibuprofen 200mg  + acetaminophen 500mg , taken together q6h for pain relief. Want to avoid high dose prolonged NSAIDs due to anticoagulation . Refer to orthopedic surgery - may need MRI  Gout Not currently flairing Unclear to me if indomethacin is safe with his Phyllis Ginger - I will message  his cardiologist to ask May need to get back on allopurinol - it's not clear to me why this was stopped (pt may have self-discontinued)  Atrial fibrillation ([place]) Stable on tikosyn & eliquis. Normal CBC recently. Continue current regimen.  FOLLOW UP: Follow up in 3 months with me for routine medical problems Referring to orthopedics  [name], [place]

## 2016-05-20 NOTE — Patient Instructions (Signed)
For knee pain - take ibuprofen 200mg  and tylenol 500mg  every 6 hours scheduled.  See how this does for your pain Ice, heat as needed  Referring back to orthopedic surgeon  Checking labs: HIV, hep C, cholesterol  I will message your cardiologist about diclofenac and indomethacin and let you know what they say.  Be well, Dr. Ardelia Mems

## 2016-05-21 LAB — HIV ANTIBODY (ROUTINE TESTING W REFLEX): HIV: NONREACTIVE

## 2016-05-21 LAB — HEPATITIS C ANTIBODY: HCV Ab: NEGATIVE

## 2016-05-23 NOTE — Assessment & Plan Note (Signed)
Stable on tikosyn & eliquis. Normal CBC recently. Continue current regimen.

## 2016-05-23 NOTE — Assessment & Plan Note (Signed)
Lipid panel today. Pending result will decide on statin.

## 2016-05-23 NOTE — Assessment & Plan Note (Addendum)
No relief with knee steroid injection last visit Will schedule low dose ibuprofen 200mg  + acetaminophen 500mg , taken together q6h for pain relief. Want to avoid high dose prolonged NSAIDs due to anticoagulation . Refer to orthopedic surgery - may need MRI

## 2016-05-23 NOTE — Assessment & Plan Note (Signed)
Not currently flairing Unclear to me if indomethacin is safe with his Phyllis Ginger - I will message his cardiologist to ask May need to get back on allopurinol - it's not clear to me why this was stopped (pt may have self-discontinued)

## 2016-05-31 ENCOUNTER — Encounter: Payer: Self-pay | Admitting: Family Medicine

## 2016-06-04 ENCOUNTER — Encounter (INDEPENDENT_AMBULATORY_CARE_PROVIDER_SITE_OTHER): Payer: Self-pay | Admitting: Orthopaedic Surgery

## 2016-06-04 ENCOUNTER — Ambulatory Visit (INDEPENDENT_AMBULATORY_CARE_PROVIDER_SITE_OTHER): Payer: 59 | Admitting: Orthopaedic Surgery

## 2016-06-04 VITALS — Ht 74.0 in | Wt 330.0 lb

## 2016-06-04 DIAGNOSIS — M1712 Unilateral primary osteoarthritis, left knee: Secondary | ICD-10-CM

## 2016-06-04 MED ORDER — TRAMADOL HCL 50 MG PO TABS: 50.0000 mg | ORAL_TABLET | Freq: Four times a day (QID) | ORAL | 0 refills | Status: DC | PRN

## 2016-06-04 NOTE — Progress Notes (Signed)
Office Visit Note   Patient: [name]           Date of Birth: May 22, 1966           MRN: [id number] Visit Date: 06/04/2016              Requested by: [name], MD [address] [place], [place] [address] PCP: Chrisandra Netters, MD   Assessment & Plan: Visit Diagnoses:  1. Unilateral primary osteoarthritis, left knee     Plan:  - xrays show moderate to severe DJD - monovisc reauth submitted - tramadol prn for pain - f/u for injection - weight loss   Follow-Up Instructions: Return if symptoms worsen or fail to improve.   Orders:  No orders of the defined types were placed in this encounter.  Meds ordered this encounter  Medications  . traMADol (ULTRAM) 50 MG tablet    Sig: Take 1 tablet (50 mg total) by mouth every 6 (six) hours as needed.    Dispense:  30 tablet    Refill:  0      Procedures: No procedures performed   Clinical Data: No additional findings.   Subjective: Chief Complaint  Patient presents with  . Right Knee - Pain    Duration 1 month    50 yo male with right knee pain and DJD who's had knee scope and microfracture by Dr. Veverly Fells 3 yrs ago.  Has had worsening pain in the last 1 month.  Pain is throughout knee and worse with climbing stairs.  Denies swelling or mechanical sxs.  Doesn't radiate.  Pain is 6/10.  Had cortisone injection a couple of weeks ago by PCP without relief.  On eliquis for afib.  Takes advil and tylenol.  Feels like it locks up after sitting for a while.    Review of Systems  Constitutional: Negative.   HENT: Negative.   Eyes: Negative.   Respiratory: Negative.   Cardiovascular: Negative.   Gastrointestinal: Negative.   Endocrine: Negative.   Genitourinary: Negative.   Musculoskeletal: Negative.   Skin: Negative.   Allergic/Immunologic: Negative.   Neurological: Negative.   Hematological: Negative.   Psychiatric/Behavioral: Negative.      Objective: Vital Signs: Ht 6\' 2"  (1.88 m)   Wt (!)  330 lb (149.7 kg)   BMI 42.37 kg/m   Physical Exam  Constitutional: He is oriented to person, place, and time. He appears well-developed and well-nourished.  HENT:  Head: Normocephalic and atraumatic.  Eyes: EOM are normal.  Neck: Neck supple.  Cardiovascular: Intact distal pulses.   Pulmonary/Chest: Effort normal.  Abdominal: Soft.  Musculoskeletal:       Right knee: He exhibits no effusion.       Left knee: He exhibits no effusion.  Neurological: He is alert and oriented to person, place, and time.  Skin: Skin is warm.  Psychiatric: He has a normal mood and affect. His behavior is normal. Judgment and thought content normal.  Nursing note and vitals reviewed.   Right Knee Exam   Tenderness  The patient is experiencing tenderness in the lateral joint line and medial joint line.  Range of Motion  The patient has normal right knee ROM.  Muscle Strength   The patient has normal right knee strength.  Tests  Varus: negative Valgus: negative  Other  Sensation: normal Pulse: present Swelling: none Other tests: no effusion present   Left Knee Exam   Other  Effusion: no effusion present  Specialty Comments:  No specialty comments available.  Imaging: No results found.   PMFS History: Patient Active Problem List   Diagnosis Date Noted  . Plantar fasciitis of left foot 05/07/2016  . 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 Acute gout   . Dizziness 03/21/2015  . Shortness of breath 03/21/2015  . OSA on CPAP   . Paroxysmal atrial fibrillation (HCC)   . Palpitations   . Chest pain 03/03/2015  . Left knee pain 10/13/2014  . Atrial fibrillation ([place]) 03/14/2014  . Right knee pain 11/10/2012  . Left hip pain 06/17/2011  . OSA (obstructive sleep apnea) 05/17/2011  . Chronic back pain 10/29/2010  . INSOMNIA 09/19/2008  . Gout 05/13/2007  . Hyperlipidemia  10/02/2006  . Morbid obesity ([place]) 10/02/2006  . MIGRAINE, UNSPEC., W/O INTRACTABLE MIGRAINE 10/02/2006  . HYPERTENSION, BENIGN SYSTEMIC 10/02/2006  . GASTROESOPHAGEAL REFLUX, NO ESOPHAGITIS 10/02/2006   Past Medical History:  Diagnosis Date  . Arthritis   . Chest pain 09/12/10   Myoview-attenuation defiect in the inferior myocardium, no ischemia, low risk scan  . GERD (gastroesophageal reflux disease)   . Gout   . History of MRI of cervical spine 09/25/10   normal; left shoulder normal as well  . Hypertension   . Migraine    "about monthly" (03/03/2015)  . Morbid obesity ([place])   . OSA on CPAP   . Paroxysmal atrial fibrillation (HCC) 7/15   chads2vasc score is 1    Family History  Problem Relation Age of Onset  . Atrial fibrillation Mother 79    had a stroke    Past Surgical History:  Procedure Laterality Date  . CARDIAC CATHETERIZATION  05/2005   "no blockage"  . KNEE ARTHROSCOPY Right 02/2013   Social History   Occupational History  . Not on file.   Social History Main Topics  . Smoking status: Former Smoker    Packs/day: 1.00    Years: 10.00    Types: Cigarettes    Quit date: 04/02/1994  . Smokeless tobacco: Former Systems developer    Types: Chew     Comment: "chewed in my teens"  . Alcohol use No     Comment: "stopped in 1994"  . Drug use: No     Comment: "stopped marijuna in 1994"  . Sexual activity: Yes

## 2016-07-03 ENCOUNTER — Telehealth (INDEPENDENT_AMBULATORY_CARE_PROVIDER_SITE_OTHER): Payer: Self-pay | Admitting: Orthopaedic Surgery

## 2016-07-03 NOTE — Telephone Encounter (Signed)
Patient called wanting to know if insurance approved him to get his injections. Please give pt a call

## 2016-07-09 ENCOUNTER — Telehealth (INDEPENDENT_AMBULATORY_CARE_PROVIDER_SITE_OTHER): Payer: Self-pay

## 2016-07-09 ENCOUNTER — Encounter (INDEPENDENT_AMBULATORY_CARE_PROVIDER_SITE_OTHER): Payer: Self-pay

## 2016-07-09 NOTE — Telephone Encounter (Signed)
FYI.Marland KitchenMarland KitchenTried to call patient back bc he had left me a voicemail to call him. No answer LMOM to return my call.

## 2016-07-09 NOTE — Telephone Encounter (Signed)
Injection will ship out tomorrow 07/10/16. Tried to call but no answer LMOM needs to sched appt to get synvisc inj

## 2016-07-15 ENCOUNTER — Ambulatory Visit (INDEPENDENT_AMBULATORY_CARE_PROVIDER_SITE_OTHER): Payer: 59 | Admitting: Orthopaedic Surgery

## 2016-07-15 ENCOUNTER — Encounter (INDEPENDENT_AMBULATORY_CARE_PROVIDER_SITE_OTHER): Payer: Self-pay | Admitting: Orthopaedic Surgery

## 2016-07-15 DIAGNOSIS — M1711 Unilateral primary osteoarthritis, right knee: Secondary | ICD-10-CM | POA: Diagnosis not present

## 2016-07-15 MED ORDER — HYLAN G-F 20 48 MG/6ML IX SOSY
48.0000 mg | PREFILLED_SYRINGE | INTRA_ARTICULAR | Status: AC | PRN
  Administered 2016-07-15: 48 mg via INTRA_ARTICULAR

## 2016-07-15 NOTE — Progress Notes (Signed)
   Procedure Note  Patient: [name]             Date of Birth: 09/23/1965           MRN: [id number]             Visit Date: 07/15/2016  Procedures: Visit Diagnoses: No diagnosis found.  Large Joint Inj Date/Time: 07/15/2016 2:31 PM Performed by: Leandrew Koyanagi Authorized by: Leandrew Koyanagi   Consent Given by:  Patient Timeout: prior to procedure the correct patient, procedure, and site was verified   Indications:  Pain Location:  Knee Site:  R knee Prep: patient was prepped and draped in usual sterile fashion   Needle Size:  22 G Approach:  Anterolateral Ultrasound Guidance: No   Fluoroscopic Guidance: No   Arthrogram: No   Medications:  48 mg Hylan 48 MG/6ML

## 2016-07-16 ENCOUNTER — Telehealth (INDEPENDENT_AMBULATORY_CARE_PROVIDER_SITE_OTHER): Payer: Self-pay | Admitting: *Deleted

## 2016-07-16 ENCOUNTER — Telehealth (INDEPENDENT_AMBULATORY_CARE_PROVIDER_SITE_OTHER): Payer: Self-pay | Admitting: Orthopaedic Surgery

## 2016-07-16 NOTE — Telephone Encounter (Signed)
Pt called stating he got a shot yesterday and now he is not able to walk. Pt requesting call back [phone]

## 2016-07-16 NOTE — Telephone Encounter (Signed)
Patient called concerning message he left earlier. I read note from Dr Erlinda Hong to the patient. Patient said he will need a note to be out of work today and tomorrow. The number to contact patient is [phone]

## 2016-07-16 NOTE — Telephone Encounter (Signed)
yes

## 2016-07-16 NOTE — Telephone Encounter (Signed)
Please advise on what to do next.

## 2016-07-16 NOTE — Telephone Encounter (Signed)
He needs to rest it for today and tomorrow.  Ice it around the clock.  Elevate.  Advil and tylenol.  He's having a flare up which 15% of people get after an injection

## 2016-07-16 NOTE — Telephone Encounter (Signed)
See message below °

## 2016-07-16 NOTE — Telephone Encounter (Signed)
Please advise on message. 

## 2016-07-17 ENCOUNTER — Emergency Department (HOSPITAL_COMMUNITY)
Admission: EM | Admit: 2016-07-17 | Discharge: 2016-07-17 | Disposition: A | Discharge status: Home / Self Care | Payer: 59 | Attending: Emergency Medicine | Admitting: Emergency Medicine

## 2016-07-17 ENCOUNTER — Encounter (INDEPENDENT_AMBULATORY_CARE_PROVIDER_SITE_OTHER): Payer: Self-pay

## 2016-07-17 ENCOUNTER — Encounter (HOSPITAL_COMMUNITY): Payer: Self-pay | Admitting: Emergency Medicine

## 2016-07-17 DIAGNOSIS — M109 Gout, unspecified: Secondary | ICD-10-CM

## 2016-07-17 DIAGNOSIS — Z7901 Long term (current) use of anticoagulants: Secondary | ICD-10-CM | POA: Insufficient documentation

## 2016-07-17 DIAGNOSIS — M10061 Idiopathic gout, right knee: Secondary | ICD-10-CM | POA: Insufficient documentation

## 2016-07-17 DIAGNOSIS — M25561 Pain in right knee: Secondary | ICD-10-CM

## 2016-07-17 DIAGNOSIS — Z87891 Personal history of nicotine dependence: Secondary | ICD-10-CM | POA: Diagnosis not present

## 2016-07-17 DIAGNOSIS — I10 Essential (primary) hypertension: Secondary | ICD-10-CM | POA: Insufficient documentation

## 2016-07-17 LAB — SYNOVIAL CELL COUNT + DIFF, W/ CRYSTALS
LYMPHOCYTES-SYNOVIAL FLD: 6 % (ref 0–20)
MONOCYTE-MACROPHAGE-SYNOVIAL FLUID: 7 % — AB (ref 50–90)
Neutrophil, Synovial: 87 % — ABNORMAL HIGH (ref 0–25)
WBC, SYNOVIAL: 23230 /mm3 — AB (ref 0–200)

## 2016-07-17 LAB — GRAM STAIN

## 2016-07-17 MED ORDER — PREDNISONE 10 MG PO TABS: 40.0000 mg | ORAL_TABLET | Freq: Every day | ORAL | 0 refills | Status: DC

## 2016-07-17 MED ORDER — ONDANSETRON 8 MG PO TBDP: 8.0000 mg | ORAL_TABLET | Freq: Three times a day (TID) | ORAL | 0 refills | Status: DC | PRN

## 2016-07-17 MED ORDER — ONDANSETRON 4 MG PO TBDP
4.0000 mg | ORAL_TABLET | Freq: Once | ORAL | Status: AC
  Administered 2016-07-17: 4 mg via ORAL
  Filled 2016-07-17: qty 1

## 2016-07-17 MED ORDER — COLCHICINE 0.6 MG PO TABS: 0.6000 mg | ORAL_TABLET | Freq: Two times a day (BID) | ORAL | 0 refills | Status: DC

## 2016-07-17 MED ORDER — LIDOCAINE HCL (PF) 1 % IJ SOLN
5.0000 mL | Freq: Once | INTRAMUSCULAR | Status: AC
  Administered 2016-07-17: 5 mL
  Filled 2016-07-17: qty 5

## 2016-07-17 NOTE — Telephone Encounter (Signed)
Pt aware about work note being ready at the front desk. 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 He was having severe pain and states they drained fluid out of his knee.

## 2016-07-17 NOTE — ED Provider Notes (Signed)
[facility] DEPT Provider Note   CSN: [id number] Arrival date & time: 07/17/16  0725     History   Chief Complaint Chief Complaint  Patient presents with  . Knee Pain    HPI [name] is a 50 y.o. male.  HPI Patient presents with right knee pain. He had a Synvisc injection in his knee by Dr. Erlinda Hong 2 days ago. States he was doing well initially but now is more pain. States he cannot walk on it. States it was going to be like this he needed more days off of work. No fevers. No numbness or weakness. States it hurts to bend the knee. He is on Eliquis for A. fib.   Past Medical History:  Diagnosis Date  . Arthritis   . Chest pain 09/12/10   Myoview-attenuation defiect in the inferior myocardium, no ischemia, low risk scan  . GERD (gastroesophageal reflux disease)   . Gout   . History of MRI of cervical spine 09/25/10   normal; left shoulder normal as well  . Hypertension   . Migraine    "about monthly" (03/03/2015)  . Morbid obesity ([place])   . OSA on CPAP   . Paroxysmal atrial fibrillation ([place]) 7/15   chads2vasc score is 1    Patient Active Problem List   Diagnosis Date Noted  . Plantar fasciitis of left foot 05/07/2016  . 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 Acute gout   . Dizziness 03/21/2015  . Shortness of breath 03/21/2015  . OSA on CPAP   . Paroxysmal atrial fibrillation (HCC)   . Palpitations   . Chest pain 03/03/2015  . Left knee pain 10/13/2014  . Atrial fibrillation ([place]) 03/14/2014  . Right knee pain 11/10/2012  . Left hip pain 06/17/2011  . OSA (obstructive sleep apnea) 05/17/2011  . Chronic back pain 10/29/2010  . INSOMNIA 09/19/2008  . Gout 05/13/2007  . Hyperlipidemia 10/02/2006  . Morbid obesity ([place]) 10/02/2006  . MIGRAINE, UNSPEC., W/O INTRACTABLE MIGRAINE 10/02/2006  . HYPERTENSION, BENIGN SYSTEMIC 10/02/2006  . GASTROESOPHAGEAL REFLUX, NO ESOPHAGITIS  10/02/2006    Past Surgical History:  Procedure Laterality Date  . CARDIAC CATHETERIZATION  05/2005   "no blockage"  . KNEE ARTHROSCOPY Right 02/2013       Home Medications    Prior to Admission medications   Medication Sig Start Date End Date Taking? Authorizing Provider  colchicine 0.6 MG tablet Take 1 tablet (0.6 mg total) by mouth 2 (two) times daily. 07/17/16   Davonna Belling, MD  dofetilide (TIKOSYN) 500 MCG capsule Take 1 capsule (500 mcg total) by mouth 2 (two) times daily. 09/14/15   Amber Sena Slate, NP  ELIQUIS 5 MG TABS tablet TAKE ONE TABLET BY MOUTH TWICE DAILY 02/01/16   [name], MD  ibuprofen (ADVIL,MOTRIN) 200 MG tablet Take 800 mg by mouth every 6 (six) hours as needed (pain). Reported on 01/05/2016    Historical Provider, MD  Omega-3 Fatty Acids (FISH OIL) 1000 MG CAPS Take 2,000 mg by mouth daily.     Historical Provider, MD  omeprazole (PRILOSEC) 20 MG capsule Take 1 capsule (20 mg total) by mouth 2 (two) times daily. 01/11/16   [name], MD  ondansetron (ZOFRAN-ODT) 8 MG disintegrating tablet Take 1 tablet (8 mg total) by mouth every 8 (eight) hours as needed for nausea or vomiting. 07/17/16   Davonna Belling, MD  predniSONE (DELTASONE) 10 MG tablet  Take 4 tablets (40 mg total) by mouth daily. 07/17/16   Davonna Belling, MD  PRESCRIPTION MEDICATION Inhale into the lungs at bedtime. CPAP    Historical Provider, MD  traMADol (ULTRAM) 50 MG tablet Take 1 tablet (50 mg total) by mouth every 6 (six) hours as needed. 06/04/16   Leandrew Koyanagi, MD    Family History Family History  Problem Relation Age of Onset  . Atrial fibrillation Mother 79    had a stroke    Social History Social History  Substance Use Topics  . Smoking status: Former Smoker    Packs/day: 1.00    Years: 10.00    Types: Cigarettes    Quit date: 04/02/1994  . Smokeless tobacco: Former Systems developer    Types: Chew     Comment: "chewed in my teens"  . Alcohol use No     Comment:  "stopped in 1994"     Allergies   Hydrochlorothiazide-propranolol [propranolol-hctz] and Penicillins   Review of Systems Review of Systems  Constitutional: Negative for activity change and fever.  Respiratory: Negative for chest tightness.   Genitourinary: Negative for flank pain.  Musculoskeletal:       Right knee pain and swelling.  Neurological: Negative for weakness.     Physical Exam Updated Vital Signs BP 122/88   Pulse 77   Temp 98.1 F (36.7 C) (Oral)   Ht 6\' 2"  (1.88 m)   Wt (!) 330 lb (149.7 kg)   SpO2 95%   BMI 42.37 kg/m   Physical Exam  Constitutional: He appears well-developed.  Cardiovascular: Normal rate.   Musculoskeletal:  Effusion to right knee. Some pain with movement of the knee. No erythema. There is effusion and there is a small scab site of previous injection superiorly and laterally. Neurovascular intact in foot.  Skin: Skin is warm.     ED Treatments / Results  Labs (all labs ordered are listed, but only abnormal results are displayed) Labs Reviewed  SYNOVIAL CELL COUNT + DIFF, W/ CRYSTALS - Abnormal; Notable for the following:       Result Value   Color, Synovial YELLOW (*)    Appearance-Synovial TURBID (*)    WBC, Synovial 23,230 (*)    Neutrophil, Synovial 87 (*)    Monocyte-Macrophage-Synovial Fluid 7 (*)    All other components within normal limits  GRAM STAIN  CULTURE, BODY FLUID-BOTTLE  MISC [facility] TEST (SEND OUT)  MISC [facility] TEST (SEND OUT)    EKG  EKG Interpretation None       Radiology No results found.  Procedures Procedures (including critical care time)  Medications Ordered in ED Medications  lidocaine (PF) (XYLOCAINE) 1 % injection 5 mL (5 mLs Other Given 07/17/16 0915)  ondansetron (ZOFRAN-ODT) disintegrating tablet 4 mg (4 mg Oral Given 07/17/16 1231)     Initial Impression / Assessment and Plan / ED Course  I have reviewed the triage vital signs and the nursing notes.  Pertinent labs &  imaging results that were available during my care of the patient were reviewed by me and considered in my medical decision making (see chart for details).  Clinical Course     Patient with right knee pain after Synvisc injection. Discussed with Dr. Marlou Sa and arthrocentesis was done. 70 mL of cloudy yellow fluid removed. Discussed results with Dr. Lorin Mercy. White count elevated at 23,000. Also had likely gout. Will treat his gout with steroids and culture seen. Follow-up with Dr. Erlinda Hong.  ARTHOCENTESIS Performed by: Mackie Pai. Consent:  Verbal consent obtained. Risks and benefits: risks, benefits and alternatives were discussed Consent given by: patient Required items: required blood products, implants, devices, and special equipment available Patient identity confirmed: verbally with patient Time out: Immediately prior to procedure a "time out" was called to verify the correct patient, procedure, equipment, support staff and site/side marked as required. Indications: joint pain and swelling Joint:right knee Local anesthesia used: 1%lidocaine Preparation: Patient was prepped and draped in the usual sterile fashion. Aspirate appearance: cloudy yello Aspirate amount: 70 ml Patient tolerance: Patient tolerated the procedure well with no immediate complications.       Final Clinical Impressions(s) / ED Diagnoses   Final diagnoses:  Acute pain of right knee  Acute gout of right knee, unspecified cause    New Prescriptions Discharge Medication List as of 07/17/2016 12:29 PM    START taking these medications   Details  colchicine 0.6 MG tablet Take 1 tablet (0.6 mg total) by mouth 2 (two) times daily., Starting Wed 07/17/2016, Print    ondansetron (ZOFRAN-ODT) 8 MG disintegrating tablet Take 1 tablet (8 mg total) by mouth every 8 (eight) hours as needed for nausea or vomiting., Starting Wed 07/17/2016, Print    predniSONE (DELTASONE) 10 MG tablet Take 4 tablets (40 mg total) by  mouth daily., Starting Wed 07/17/2016, Print         Davonna Belling, MD 07/17/16 [phone]

## 2016-07-17 NOTE — ED Notes (Signed)
Dr. Alvino Chapel at bedside, performing fluid aspiration

## 2016-07-17 NOTE — ED Triage Notes (Addendum)
Pt in from home with c/o R knee pain and swelling, worse x 2 days. Hx of arthritis in R knee, got shot of Synvisc at orthopedic office 2 days ago. Pt states pain is worse now. Swelling noted laterally from knee cap. States c/o n/v since shot as well. Took Tylenol PTA, unable to bear weight on R leg

## 2016-07-17 NOTE — ED Notes (Signed)
Pt verbalizes understanding of discharge instructions. NAD. Denies any questions. Wheeled to waiting room for wife to transport home.

## 2016-07-18 LAB — MISC LABCORP TEST (SEND OUT)
LABCORP TEST CODE: 19497
LABCORP TEST CODE: 19588

## 2016-07-22 LAB — CULTURE, BODY FLUID-BOTTLE: CULTURE: NO GROWTH

## 2016-07-22 LAB — CULTURE, BODY FLUID W GRAM STAIN -BOTTLE

## 2016-07-23 ENCOUNTER — Ambulatory Visit (INDEPENDENT_AMBULATORY_CARE_PROVIDER_SITE_OTHER): Payer: 59 | Admitting: Orthopaedic Surgery

## 2016-07-23 ENCOUNTER — Encounter (INDEPENDENT_AMBULATORY_CARE_PROVIDER_SITE_OTHER): Payer: Self-pay | Admitting: Orthopaedic Surgery

## 2016-07-23 DIAGNOSIS — M1711 Unilateral primary osteoarthritis, right knee: Secondary | ICD-10-CM | POA: Insufficient documentation

## 2016-07-23 HISTORY — DX: Unilateral primary osteoarthritis, right knee: M17.11

## 2016-07-23 NOTE — Progress Notes (Signed)
Office Visit Note   Patient: [name]           Date of Birth: 07-May-1966           MRN: [id number] Visit Date: 07/23/2016              Requested by: [name], MD [address] [place], [place] [address] PCP: Chrisandra Netters, MD   Assessment & Plan: Visit Diagnoses:  1. Primary osteoarthritis of right knee     Plan: At this point we do light to order an MRI to fully evaluate his knee given his chronic pain and lack of response to conservative treatment we'll see him back after the MRI  Follow-Up Instructions: Return in about 2 weeks (around 08/06/2016).   Orders:  Orders Placed This Encounter  Procedures  . MR Knee Right w/o contrast   No orders of the defined types were placed in this encounter.     Procedures: No procedures performed   Clinical Data: No additional findings.   Subjective: Chief Complaint  Patient presents with  . Right Knee - Pain    Patient follows up today for right knee pain. He underwent Synvisc injection and had a reactive effusion that was drained by the ER. He is feeling much better. He is range of motion and strength are starting to normalize. He still has baseline 3 out of 10 pain from arthritis. Denies any constitutional symptoms.    Review of Systems   Objective: Vital Signs: There were no vitals taken for this visit.  Physical Exam  Ortho Exam Exam of the right knee is stable. Specialty Comments:  No specialty comments available.  Imaging: No results found.   PMFS History: Patient Active Problem List   Diagnosis Date Noted  . Primary osteoarthritis of right knee 07/23/2016  . Plantar fasciitis of left foot 05/07/2016  . 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 Acute gout   . Dizziness 03/21/2015  . Shortness of breath 03/21/2015  . OSA on CPAP   . Paroxysmal atrial fibrillation (HCC)   . Palpitations   .  Chest pain 03/03/2015  . Left knee pain 10/13/2014  . Atrial fibrillation ([place]) 03/14/2014  . Right knee pain 11/10/2012  . Left hip pain 06/17/2011  . OSA (obstructive sleep apnea) 05/17/2011  . Chronic back pain 10/29/2010  . INSOMNIA 09/19/2008  . Gout 05/13/2007  . Hyperlipidemia 10/02/2006  . Morbid obesity ([place]) 10/02/2006  . MIGRAINE, UNSPEC., W/O INTRACTABLE MIGRAINE 10/02/2006  . HYPERTENSION, BENIGN SYSTEMIC 10/02/2006  . GASTROESOPHAGEAL REFLUX, NO ESOPHAGITIS 10/02/2006   Past Medical History:  Diagnosis Date  . Arthritis   . Chest pain 09/12/10   Myoview-attenuation defiect in the inferior myocardium, no ischemia, low risk scan  . GERD (gastroesophageal reflux disease)   . Gout   . History of MRI of cervical spine 09/25/10   normal; left shoulder normal as well  . Hypertension   . Migraine    "about monthly" (03/03/2015)  . Morbid obesity ([place])   . OSA on CPAP   . Paroxysmal atrial fibrillation ([facility]) 7/15   chads2vasc score is 1    Family History  Problem Relation Age of Onset  . Atrial fibrillation Mother 89    had a stroke    Past Surgical History:  Procedure Laterality Date  . CARDIAC CATHETERIZATION  05/2005   "no blockage"  . KNEE ARTHROSCOPY Right 02/2013  Social History   Occupational History  . Not on file.   Social History Main Topics  . Smoking status: Former Smoker    Packs/day: 1.00    Years: 10.00    Types: Cigarettes    Quit date: 04/02/1994  . Smokeless tobacco: Former Systems developer    Types: Chew     Comment: "chewed in my teens"  . Alcohol use No     Comment: "stopped in 1994"  . Drug use: No     Comment: "stopped marijuna in 1994"  . Sexual activity: Yes

## 2016-08-06 ENCOUNTER — Other Ambulatory Visit: Payer: Self-pay | Admitting: Family Medicine

## 2016-08-12 ENCOUNTER — Ambulatory Visit
Admission: RE | Admit: 2016-08-12 | Discharge: 2016-08-12 | Disposition: A | Discharge status: Home / Self Care | Payer: 59 | Source: Ambulatory Visit | Attending: Orthopaedic Surgery | Admitting: Orthopaedic Surgery

## 2016-08-12 DIAGNOSIS — M25561 Pain in right knee: Secondary | ICD-10-CM | POA: Diagnosis not present

## 2016-08-12 DIAGNOSIS — M1711 Unilateral primary osteoarthritis, right knee: Secondary | ICD-10-CM

## 2016-08-26 ENCOUNTER — Encounter (INDEPENDENT_AMBULATORY_CARE_PROVIDER_SITE_OTHER): Payer: Self-pay | Admitting: Orthopaedic Surgery

## 2016-08-26 ENCOUNTER — Ambulatory Visit (INDEPENDENT_AMBULATORY_CARE_PROVIDER_SITE_OTHER): Payer: 59 | Admitting: Orthopaedic Surgery

## 2016-08-26 DIAGNOSIS — M1711 Unilateral primary osteoarthritis, right knee: Secondary | ICD-10-CM | POA: Diagnosis not present

## 2016-08-26 NOTE — Progress Notes (Signed)
Office Visit Note   Patient: [name]           Date of Birth: January 10, 1966           MRN: [id number] Visit Date: 08/26/2016              Requested by: [name], MD [address] [place], [place] [address] PCP: Chrisandra Netters, MD   Assessment & Plan: Visit Diagnoses: No diagnosis found.  Plan: MRI of the right knee reviewed shows essentially degenerative changes consistent with tricompartmental osteoarthritis. We discussed the MRI findings at this point I don't think another arthroscopy would necessarily give her much relief. I think what he needs is a total knee replacement which she will think about. He'll let us know what he decides. He is 6 feet 2 inches 333 pounds BMI 42  Follow-Up Instructions: Return if symptoms worsen or fail to improve.   Orders:  No orders of the defined types were placed in this encounter.  No orders of the defined types were placed in this encounter.     Procedures: No procedures performed   Clinical Data: No additional findings.   Subjective: Chief Complaint  Patient presents with  . Right Knee - Pain, Follow-up    Patient comes back today for review his MRI. He continues to have baseline chronic pain with occasional mechanical's symptoms. He is had knee arthroscopy in the past with minimal relief.    Review of Systems   Objective: Vital Signs: There were no vitals taken for this visit.  Physical Exam  Ortho Exam Exam of the right knee is stable. Specialty Comments:  No specialty comments available.  Imaging: No results found.   PMFS History: Patient Active Problem List   Diagnosis Date Noted  . Primary osteoarthritis of right knee 07/23/2016  . Plantar fasciitis of left foot 05/07/2016  . 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 Acute gout   . Dizziness 03/21/2015  . Shortness of breath 03/21/2015  . OSA on  CPAP   . Paroxysmal atrial fibrillation (HCC)   . Palpitations   . Chest pain 03/03/2015  . Left knee pain 10/13/2014  . Atrial fibrillation ([place]) 03/14/2014  . Right knee pain 11/10/2012  . Left hip pain 06/17/2011  . OSA (obstructive sleep apnea) 05/17/2011  . Chronic back pain 10/29/2010  . INSOMNIA 09/19/2008  . Gout 05/13/2007  . Hyperlipidemia 10/02/2006  . Morbid obesity ([place]) 10/02/2006  . MIGRAINE, UNSPEC., W/O INTRACTABLE MIGRAINE 10/02/2006  . HYPERTENSION, BENIGN SYSTEMIC 10/02/2006  . GASTROESOPHAGEAL REFLUX, NO ESOPHAGITIS 10/02/2006   Past Medical History:  Diagnosis Date  . Arthritis   . Chest pain 09/12/10   Myoview-attenuation defiect in the inferior myocardium, no ischemia, low risk scan  . GERD (gastroesophageal reflux disease)   . Gout   . History of MRI of cervical spine 09/25/10   normal; left shoulder normal as well  . Hypertension   . Migraine    "about monthly" (03/03/2015)  . Morbid obesity ([place])   . OSA on CPAP   . Paroxysmal atrial fibrillation (HCC) 7/15   chads2vasc score is 1    Family History  Problem Relation Age of Onset  . Atrial fibrillation Mother 45    had a stroke    Past Surgical History:  Procedure Laterality Date  . CARDIAC CATHETERIZATION  05/2005   "no blockage"  . KNEE ARTHROSCOPY Right 02/2013  Social History   Occupational History  . Not on file.   Social History Main Topics  . Smoking status: Former Smoker    Packs/day: 1.00    Years: 10.00    Types: Cigarettes    Quit date: 04/02/1994  . Smokeless tobacco: Former Systems developer    Types: Chew     Comment: "chewed in my teens"  . Alcohol use No     Comment: "stopped in 1994"  . Drug use: No     Comment: "stopped marijuna in 1994"  . Sexual activity: Yes

## 2016-08-28 DIAGNOSIS — G4733 Obstructive sleep apnea (adult) (pediatric): Secondary | ICD-10-CM | POA: Diagnosis not present

## 2016-08-30 ENCOUNTER — Telehealth (INDEPENDENT_AMBULATORY_CARE_PROVIDER_SITE_OTHER): Payer: Self-pay | Admitting: *Deleted

## 2016-08-30 NOTE — Telephone Encounter (Signed)
ERROR

## 2016-09-06 ENCOUNTER — Ambulatory Visit (INDEPENDENT_AMBULATORY_CARE_PROVIDER_SITE_OTHER): Payer: 59 | Admitting: Student

## 2016-09-06 ENCOUNTER — Encounter: Payer: Self-pay | Admitting: Student

## 2016-09-06 ENCOUNTER — Telehealth (HOSPITAL_COMMUNITY): Payer: Self-pay | Admitting: *Deleted

## 2016-09-06 DIAGNOSIS — J0111 Acute recurrent frontal sinusitis: Secondary | ICD-10-CM

## 2016-09-06 MED ORDER — DOXYCYCLINE HYCLATE 100 MG PO TABS: 100.0000 mg | ORAL_TABLET | Freq: Two times a day (BID) | ORAL | 0 refills | Status: DC

## 2016-09-06 MED ORDER — AMOXICILLIN-POT CLAVULANATE 875-125 MG PO TABS: 1.0000 | ORAL_TABLET | Freq: Two times a day (BID) | ORAL | 0 refills | Status: DC

## 2016-09-06 NOTE — Assessment & Plan Note (Addendum)
History and physical exam consistent with acute sinusitis. Given that the nonoperative 3 weeks concerned that it warrants antibiotics. - doxycycline 10 days given h/o PCN allergy - Follow in 2 weeks if symptoms not improved

## 2016-09-06 NOTE — Addendum Note (Signed)
Addended by: Marina Goodell A on: 09/06/2016 03:01 PM   Modules accepted: Orders

## 2016-09-06 NOTE — Progress Notes (Addendum)
   Subjective:    Patient ID: [name], male    DOB: 1965-11-02, 51 y.o.   MRN: [id number]   CC: Concern for sinus infection  HPI: 51 year old male presenting for face pain and congestion concerning for sinus infection  Concern for sinus infection - Started to have facial pain, congestion proximally 3 weeks ago - Denies fevers, sore throat, ear pain - Has taken intermittent Tylenol but no other medications   Smoking status reviewed  Review of Systems  Per HPI, else denies  chest pain, shortness of breath, abdominal pain, N/V/D,    Objective:  BP (!) 146/82   Pulse 83   Temp 97.6 F (36.4 C) (Oral)   Wt (!) 344 lb (156 kg)   SpO2 98%   BMI 44.17 kg/m  Vitals and nursing note reviewed  General: NAD HEENT: tenderness to palpation over frontal and maxilarry sinuses, normal oropharynx without erythema, lesions or exudates Cardiac: RRR Respiratory: CTAB, normal effort Extremities: no edema or cyanosis.  Skin: warm and dry, no rashes noted Neuro: alert and oriented   Assessment & Plan:    Sinusitis History and physical exam consistent with acute sinusitis. Given that the nonoperative 3 weeks concerned that it warrants antibiotics. - doxycycline 10 days given h/o PCN allergy - Follow in 2 weeks if symptoms not improved    Adrie Picking A. Lincoln Brigham MD, [facility] Resident PGY-3 Pager [phone]

## 2016-09-06 NOTE — Patient Instructions (Signed)
Take Augmentin antibiotic as prescribed Follow up in 2 weeks if symptoms have not improved after starting the antibiotics If you have worsening symptoms, call the office to be evaluated sooner

## 2016-09-06 NOTE — Telephone Encounter (Signed)
Pt called in stating he has been having sinus issues for a couple weeks and cannot seem to get it to go away. Suggested pt call his PCP and be assessed if he has a sinus infection as antibiotics may be required. Instructed to have PCP consult with pharmacist as he is on tikosyn and there are many interactions between antibiotics and tikosyn. Pt verbalized understanding.

## 2016-09-10 ENCOUNTER — Other Ambulatory Visit: Payer: Self-pay

## 2016-09-10 ENCOUNTER — Emergency Department (HOSPITAL_COMMUNITY)
Admission: EM | Admit: 2016-09-10 | Discharge: 2016-09-11 | Disposition: A | Discharge status: Home / Self Care | Payer: 59 | Attending: Emergency Medicine | Admitting: Emergency Medicine

## 2016-09-10 DIAGNOSIS — Z79899 Other long term (current) drug therapy: Secondary | ICD-10-CM | POA: Diagnosis not present

## 2016-09-10 DIAGNOSIS — R531 Weakness: Secondary | ICD-10-CM | POA: Diagnosis not present

## 2016-09-10 DIAGNOSIS — R1031 Right lower quadrant pain: Secondary | ICD-10-CM | POA: Diagnosis not present

## 2016-09-10 DIAGNOSIS — Z87891 Personal history of nicotine dependence: Secondary | ICD-10-CM | POA: Diagnosis not present

## 2016-09-10 DIAGNOSIS — R55 Syncope and collapse: Secondary | ICD-10-CM | POA: Insufficient documentation

## 2016-09-10 DIAGNOSIS — R404 Transient alteration of awareness: Secondary | ICD-10-CM | POA: Diagnosis not present

## 2016-09-10 DIAGNOSIS — I10 Essential (primary) hypertension: Secondary | ICD-10-CM | POA: Diagnosis not present

## 2016-09-10 DIAGNOSIS — Z7901 Long term (current) use of anticoagulants: Secondary | ICD-10-CM | POA: Insufficient documentation

## 2016-09-10 NOTE — ED Triage Notes (Addendum)
Pt reports head congestion, light headed, chest tightness with cough, sinus infection and rx'd doxycycline last Friday. Wife is at bedside and reports (+) syncope episode at approximately 2030.

## 2016-09-11 ENCOUNTER — Encounter (HOSPITAL_COMMUNITY): Payer: Self-pay

## 2016-09-11 ENCOUNTER — Emergency Department (HOSPITAL_COMMUNITY): Payer: 59

## 2016-09-11 DIAGNOSIS — R1031 Right lower quadrant pain: Secondary | ICD-10-CM | POA: Diagnosis not present

## 2016-09-11 LAB — URINALYSIS, ROUTINE W REFLEX MICROSCOPIC
BILIRUBIN URINE: NEGATIVE
GLUCOSE, UA: NEGATIVE mg/dL
KETONES UR: NEGATIVE mg/dL
LEUKOCYTES UA: NEGATIVE
Nitrite: NEGATIVE
PH: 6 (ref 5.0–8.0)
Protein, ur: NEGATIVE mg/dL
Specific Gravity, Urine: 1.011 (ref 1.005–1.030)
Squamous Epithelial / LPF: NONE SEEN

## 2016-09-11 LAB — CBC WITH DIFFERENTIAL/PLATELET
BASOS PCT: 0 %
Basophils Absolute: 0 10*3/uL (ref 0.0–0.1)
Eosinophils Absolute: 0.2 10*3/uL (ref 0.0–0.7)
Eosinophils Relative: 3 %
HEMATOCRIT: 37.2 % — AB (ref 39.0–52.0)
HEMOGLOBIN: 12.6 g/dL — AB (ref 13.0–17.0)
LYMPHS ABS: 2.8 10*3/uL (ref 0.7–4.0)
LYMPHS PCT: 42 %
MCH: 26.7 pg (ref 26.0–34.0)
MCHC: 33.9 g/dL (ref 30.0–36.0)
MCV: 78.8 fL (ref 78.0–100.0)
MONOS PCT: 7 %
Monocytes Absolute: 0.5 10*3/uL (ref 0.1–1.0)
NEUTROS ABS: 3.3 10*3/uL (ref 1.7–7.7)
Neutrophils Relative %: 48 %
Platelets: 203 10*3/uL (ref 150–400)
RBC: 4.72 MIL/uL (ref 4.22–5.81)
RDW: 13.3 % (ref 11.5–15.5)
WBC: 6.8 10*3/uL (ref 4.0–10.5)

## 2016-09-11 LAB — COMPREHENSIVE METABOLIC PANEL
ALBUMIN: 4.1 g/dL (ref 3.5–5.0)
ALT: 26 U/L (ref 17–63)
ANION GAP: 7 (ref 5–15)
AST: 31 U/L (ref 15–41)
Alkaline Phosphatase: 82 U/L (ref 38–126)
BUN: 13 mg/dL (ref 6–20)
CHLORIDE: 107 mmol/L (ref 101–111)
CO2: 26 mmol/L (ref 22–32)
Calcium: 9.2 mg/dL (ref 8.9–10.3)
Creatinine, Ser: 1.07 mg/dL (ref 0.61–1.24)
GFR calc Af Amer: >60 mL/min (ref >60)
GFR calc non Af Amer: >60 mL/min (ref >60)
GLUCOSE: 114 mg/dL — AB (ref 65–99)
POTASSIUM: 3.7 mmol/L (ref 3.5–5.1)
SODIUM: 140 mmol/L (ref 135–145)
Total Bilirubin: 0.6 mg/dL (ref 0.3–1.2)
Total Protein: 7.5 g/dL (ref 6.5–8.1)

## 2016-09-11 LAB — I-STAT TROPONIN, ED: Troponin i, poc: 0 ng/mL (ref 0.00–0.08)

## 2016-09-11 LAB — LIPASE, BLOOD: Lipase: 25 U/L (ref 11–51)

## 2016-09-11 MED ORDER — SODIUM CHLORIDE 0.9 % IJ SOLN
INTRAMUSCULAR | Status: AC
  Filled 2016-09-11: qty 50

## 2016-09-11 MED ORDER — IOPAMIDOL (ISOVUE-300) INJECTION 61%
INTRAVENOUS | Status: AC
  Administered 2016-09-11: 100 mL via INTRAVENOUS
  Filled 2016-09-11: qty 100

## 2016-09-11 MED ORDER — IOPAMIDOL (ISOVUE-300) INJECTION 61%
100.0000 mL | Freq: Once | INTRAVENOUS | Status: AC | PRN
Start: 2016-09-11 — End: 2016-09-11
  Administered 2016-09-11: 100 mL via INTRAVENOUS

## 2016-09-11 MED ORDER — SODIUM CHLORIDE 0.9 % IV BOLUS (SEPSIS)
1000.0000 mL | Freq: Once | INTRAVENOUS | Status: AC
  Administered 2016-09-11: 1000 mL via INTRAVENOUS

## 2016-09-11 MED ORDER — HYDROCODONE-ACETAMINOPHEN 5-325 MG PO TABS
2.0000 | ORAL_TABLET | Freq: Once | ORAL | Status: AC
  Administered 2016-09-11: 2 via ORAL
  Filled 2016-09-11: qty 2

## 2016-09-11 MED ORDER — FENTANYL CITRATE (PF) 100 MCG/2ML IJ SOLN
100.0000 ug | Freq: Once | INTRAMUSCULAR | Status: AC
  Administered 2016-09-11: 100 ug via INTRAVENOUS
  Filled 2016-09-11: qty 2

## 2016-09-11 NOTE — Discharge Instructions (Signed)

## 2016-09-11 NOTE — ED Notes (Signed)
Ambulated pt in hall, BP was 143/111 (112) with a pulse of 66, Oxygen of 96 and 21 Respirations after being ambulated.

## 2016-09-11 NOTE — ED Provider Notes (Signed)
[facility] DEPT Provider Note   CSN: [id number] Arrival date & time: 09/10/16  2203  By signing my name below, I, [name], attest that this documentation has been prepared under the direction and in the presence of [name], MD. Electronically Signed: Oleh Leblanc, Scribe. 09/11/16. 12:40 AM.   History   Chief Complaint Chief Complaint  Patient presents with  . Nasal Congestion    HPI [name] is a 51 y.o. male with history of A-fib on Eliquis.  The history is provided by the patient. No language interpreter was used.  Loss of Consciousness   This is a new problem. The current episode started 1 to 2 hours ago. Episode frequency: Never before.  Length of episode of loss of consciousness: Downtime approximately 10 seconds. Associated with: Acting normally just prior to syncope. No altered mental status. No associated chest pain or seizure activity. Wife witnessed.  Associated symptoms include abdominal pain, headaches and light-headedness. Pertinent negatives include chest pain, nausea, seizures and vomiting. Associated symptoms comments: Denies any head injury. .  Headache   The current episode started more than 1 week ago. Episode frequency: Intermittently.  The problem has not changed since onset.The pain is located in the frontal region. The pain does not radiate. Pertinent negatives include no nausea and no vomiting. Associated symptoms comments: Associated with chest tightness, mild dry cough, rhinorrhea. . Treatments tried: Was prescribed Doxycycline by primary care.   Abdominal Pain   This is a new problem. The current episode started 12 to 24 hours ago. The problem occurs constantly. The pain is located in the RLQ. The pain is at a severity of 6/10. Associated symptoms include headaches. Pertinent negatives include diarrhea, nausea and vomiting.    Past Medical History:  Diagnosis Date  . Arthritis   . Chest pain 09/12/10   Myoview-attenuation defiect in  the inferior myocardium, no ischemia, low risk scan  . GERD (gastroesophageal reflux disease)   . Gout   . History of MRI of cervical spine 09/25/10   normal; left shoulder normal as well  . Hypertension   . Migraine    "about monthly" (03/03/2015)  . Morbid obesity ([place])   . OSA on CPAP   . Paroxysmal atrial fibrillation ([place]) 7/15   chads2vasc score is 1    Patient Active Problem List   Diagnosis Date Noted  . Primary osteoarthritis of right knee 07/23/2016  . Plantar fasciitis of left foot 05/07/2016  . 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 Acute gout   . Dizziness 03/21/2015  . Shortness of breath 03/21/2015  . OSA on CPAP   . Paroxysmal atrial fibrillation (HCC)   . Palpitations   . Chest pain 03/03/2015  . Left knee pain 10/13/2014  . Atrial fibrillation ([place]) 03/14/2014  . Right knee pain 11/10/2012  . Sinusitis 10/23/2012  . Left hip pain 06/17/2011  . OSA (obstructive sleep apnea) 05/17/2011  . Chronic back pain 10/29/2010  . INSOMNIA 09/19/2008  . Gout 05/13/2007  . Hyperlipidemia 10/02/2006  . Morbid obesity ([place]) 10/02/2006  . MIGRAINE, UNSPEC., W/O INTRACTABLE MIGRAINE 10/02/2006  . HYPERTENSION, BENIGN SYSTEMIC 10/02/2006  . GASTROESOPHAGEAL REFLUX, NO ESOPHAGITIS 10/02/2006    Past Surgical History:  Procedure Laterality Date  . CARDIAC CATHETERIZATION  05/2005   "no blockage"  . KNEE ARTHROSCOPY Right 02/2013       Home Medications    Prior to Admission medications  Medication Sig Start Date End Date Taking? Authorizing Provider  colchicine 0.6 MG tablet Take 1 tablet (0.6 mg total) by mouth 2 (two) times daily. 07/17/16  Yes Davonna Belling, MD  dofetilide (TIKOSYN) 500 MCG capsule Take 1 capsule (500 mcg total) by mouth 2 (two) times daily. 09/14/15  Yes Amber Sena Slate, NP  doxycycline (VIBRA-TABS) 100 MG tablet Take 1 tablet (100 mg total) by mouth 2  (two) times daily. 09/06/16  Yes Veatrice Bourbon, MD  ELIQUIS 5 MG TABS tablet TAKE ONE TABLET BY MOUTH TWICE DAILY 08/07/16  Yes [name], MD  ibuprofen (ADVIL,MOTRIN) 200 MG tablet Take 800 mg by mouth every 6 (six) hours as needed (pain). Reported on 01/05/2016   Yes Historical Provider, MD  Omega-3 Fatty Acids (FISH OIL) 1000 MG CAPS Take 2,000 mg by mouth daily.    Yes Historical Provider, MD  omeprazole (PRILOSEC) 20 MG capsule Take 1 capsule (20 mg total) by mouth 2 (two) times daily. 01/11/16  Yes [name], MD  traMADol (ULTRAM) 50 MG tablet Take 1 tablet (50 mg total) by mouth every 6 (six) hours as needed. Patient taking differently: Take 50 mg by mouth every 6 (six) hours as needed for moderate pain.  06/04/16  Yes Naiping Ephriam Jenkins, MD  ondansetron (ZOFRAN-ODT) 8 MG disintegrating tablet Take 1 tablet (8 mg total) by mouth every 8 (eight) hours as needed for nausea or vomiting. Patient not taking: Reported on 08/26/2016 07/17/16   Davonna Belling, MD  predniSONE (DELTASONE) 10 MG tablet Take 4 tablets (40 mg total) by mouth daily. Patient not taking: Reported on 08/26/2016 07/17/16   Davonna Belling, MD  PRESCRIPTION MEDICATION Inhale into the lungs at bedtime. CPAP    Historical Provider, MD    Family History Family History  Problem Relation Age of Onset  . Atrial fibrillation Mother 44    had a stroke    Social History Social History  Substance Use Topics  . Smoking status: Former Smoker    Packs/day: 1.00    Years: 10.00    Types: Cigarettes    Quit date: 04/02/1994  . Smokeless tobacco: Former Systems developer    Types: Chew     Comment: "chewed in my teens"  . Alcohol use No     Comment: "stopped in 1994"     Allergies   Hydrochlorothiazide-propranolol [propranolol-hctz] and Penicillins   Review of Systems Review of Systems  Cardiovascular: Negative for chest pain.  Gastrointestinal: Positive for abdominal pain. Negative for diarrhea, nausea and vomiting.    Neurological: Positive for syncope, light-headedness and headaches. Negative for seizures.  All other systems reviewed and are negative.    Physical Exam Updated Vital Signs BP 137/92 (BP Location: Right Arm)   Pulse 75   Temp 97.5 F (36.4 C)   Resp 18   SpO2 96%   Physical Exam CONSTITUTIONAL: Well developed/well nourished HEAD: Normocephalic/atraumatic, no signs of trauma EYES: EOMI/PERRL ENMT: Mucous membranes moist NECK: supple no meningeal signs SPINE/BACK:entire spine nontender CV: S1/S2 noted, no murmurs/rubs/gallops noted LUNGS: Lungs are clear to auscultation bilaterally, no apparent distress ABDOMEN: soft, mild RLQ tenderness, no rebound or guarding, bowel sounds noted throughout abdomen.  Pt is obese GU:no cva tenderness, no hernia, no scrotal tenderness, chaperone present NEURO: Pt is awake/alert/appropriate, moves all extremitiesx4.  No facial droop. No arm or leg drift noted.  EXTREMITIES: pulses normal/equal, full ROM SKIN: warm, color normal PSYCH: no abnormalities of mood noted, alert and oriented to situation  ED Treatments / Results  DIAGNOSTIC STUDIES: Oxygen Saturation is 96 percent on room air which is normal by my interpretation.    COORDINATION OF CARE: 12:38 AM Discussed treatment plan with pt at bedside and pt agreed to plan.  Labs (all labs ordered are listed, but only abnormal results are displayed) Labs Reviewed  COMPREHENSIVE METABOLIC PANEL - Abnormal; Notable for the following:       Result Value   Glucose, Bld 114 (*)    All other components within normal limits  CBC WITH DIFFERENTIAL/PLATELET - Abnormal; Notable for the following:    Hemoglobin 12.6 (*)    HCT 37.2 (*)    All other components within normal limits  URINALYSIS, ROUTINE W REFLEX MICROSCOPIC - Abnormal; Notable for the following:    Color, Urine STRAW (*)    Hgb urine dipstick SMALL (*)    Bacteria, UA RARE (*)    All other components within normal limits  LIPASE,  BLOOD  I-STAT TROPOININ, ED    EKG  EKG Interpretation  Date/Time:  Tuesday September 10 2016 23:11:36 EST Ventricular Rate:  71 PR Interval:    QRS Duration: 107 QT Interval:  401 QTC Calculation: 436 R Axis:   60 Text Interpretation:  Sinus rhythm Normal ECG No significant change since last tracing Confirmed by Christy Gentles  MD, Symphani Eckstrom ([id number]) on 09/10/2016 11:20:36 PM       Radiology Ct Abdomen Pelvis W Contrast  Result Date: 09/11/2016 CLINICAL DATA:  Right lower quadrant pain for 1 day. EXAM: CT ABDOMEN AND PELVIS WITH CONTRAST TECHNIQUE: Multidetector CT imaging of the abdomen and pelvis was performed using the standard protocol following bolus administration of intravenous contrast. CONTRAST:  100 mL Isovue-300 COMPARISON:  None. FINDINGS: Lower chest: Mild dependent changes in the lung bases. Hepatobiliary: No focal liver abnormality is seen. No gallstones, gallbladder wall thickening, or biliary dilatation. Pancreas: Unremarkable. No pancreatic ductal dilatation or surrounding inflammatory changes. Spleen: Normal in size without focal abnormality. Adrenals/Urinary Tract: No adrenal gland nodules. Multiple cysts in the right kidney. No hydronephrosis or hydroureter. Renal nephrograms are symmetrical. Bladder wall is not thickened and no filling defects are demonstrated. Stomach/Bowel: Stomach, small bowel, and colon are not abnormally distended. Stool fills the colon. No bowel wall thickening. The appendix is normal. Vascular/Lymphatic: No significant vascular findings are present. No enlarged abdominal or pelvic lymph nodes. Reproductive: Prostate is unremarkable. Other: No abdominal wall hernia or abnormality. No abdominopelvic ascites. Musculoskeletal: No acute or significant osseous findings. IMPRESSION: No acute process demonstrated in the abdomen or pelvis. No evidence of bowel obstruction or inflammation. Appendix is normal. Electronically Signed   By: Lucienne Capers M.D.   On:  09/11/2016 03:57    Procedures Procedures (including critical care time)  Medications Ordered in ED Medications  sodium chloride 0.9 % injection (not administered)  sodium chloride 0.9 % bolus 1,000 mL (0 mLs Intravenous Stopped 09/11/16 0210)  HYDROcodone-acetaminophen (NORCO/VICODIN) 5-325 MG per tablet 2 tablet (2 tablets Oral Given 09/11/16 0050)  fentaNYL (SUBLIMAZE) injection 100 mcg (100 mcg Intravenous Given 09/11/16 0248)  iopamidol (ISOVUE-300) 61 % injection 100 mL (100 mLs Intravenous Contrast Given 09/11/16 0339)     Initial Impression / Assessment and Plan / ED Course  I have reviewed the triage vital signs and the nursing notes.  Pertinent labs  results that were available during my care of the patient were reviewed by me and considered in my medical decision making (see chart for details).     2:54  AM Pt reports dizziness improved However, he still has RLQ tenderness Will obtain CT imaging    Pt stable in the ED CT imaging negative As for his dizziness, this improved, he is ambulatory No focal weakness He reports HA resolved (doubt ICH or other acute neurologic emergency as he has no neuro deficits/well appearing) As for syncope, he had echo last year and currently in sinus rhythm without arrhythmia I reviewed interactions with doxycycline and tikosyn and no interactions noted With isolated episode of syncope, I feel he is safe for d/c home Advised need for cardiology f/u if dizziness returns   Final Clinical Impressions(s) / ED Diagnoses   Final diagnoses:  Syncope, unspecified syncope type  Right lower quadrant abdominal pain    New Prescriptions Discharge Medication List as of 09/11/2016  4:33 AM    I personally performed the services described in this documentation, which was scribed in my presence. The recorded information has been reviewed and is accurate.       [name], MD 09/11/16 [phone]

## 2016-09-20 ENCOUNTER — Other Ambulatory Visit: Payer: Self-pay | Admitting: Family Medicine

## 2016-09-23 ENCOUNTER — Other Ambulatory Visit: Payer: Self-pay | Admitting: Nurse Practitioner

## 2016-09-25 ENCOUNTER — Other Ambulatory Visit: Payer: Self-pay | Admitting: Internal Medicine

## 2016-09-25 MED ORDER — DOFETILIDE 500 MCG PO CAPS: 500.0000 ug | ORAL_CAPSULE | Freq: Two times a day (BID) | ORAL | 1 refills | Status: DC

## 2016-10-14 ENCOUNTER — Ambulatory Visit: Payer: 59 | Admitting: Family Medicine

## 2016-11-11 ENCOUNTER — Encounter: Payer: Self-pay | Admitting: Student

## 2016-11-11 ENCOUNTER — Ambulatory Visit (INDEPENDENT_AMBULATORY_CARE_PROVIDER_SITE_OTHER): Payer: 59 | Admitting: Student

## 2016-11-11 DIAGNOSIS — M4726 Other spondylosis with radiculopathy, lumbar region: Secondary | ICD-10-CM | POA: Diagnosis not present

## 2016-11-11 DIAGNOSIS — M722 Plantar fascial fibromatosis: Secondary | ICD-10-CM | POA: Diagnosis not present

## 2016-11-11 MED ORDER — TRAMADOL HCL 50 MG PO TABS: 50.0000 mg | ORAL_TABLET | Freq: Four times a day (QID) | ORAL | 1 refills | Status: DC | PRN

## 2016-11-11 NOTE — Progress Notes (Addendum)
   Subjective:    Patient ID: [name], male    DOB: Mar 27, 1966, 51 y.o.   MRN: [id number]   CC: Low back pain, left heel pain  HPI: 51 y/o M presensts for continued ow back pain and right heel pain   Low back pain - this is chronic in nature - he has been treating it with tramadol but he feels it does not help very much - he does have some radiation of the pain to his right thigh but no numbness or tingling - no weakness - he feels his back pain has gotten worse since trying to lose weight at [facility]  Left heel pain - right heel pain for the last year - worse when wearing his steel toe boots - started after he started wearing steel toe boots - pain is less when he wears sneakers -   Smoking status reviewed  Review of Systems  Per HPI, else denies chest pain, shortness of breath,     Objective:  BP 120/78   Pulse (!) 58   Temp 97.9 F (36.6 C) (Oral)   Wt (!) 322 lb (146.1 kg)   SpO2 96%   BMI 41.34 kg/m  Vitals and nursing note reviewed  General: NAD Cardiac: RRR,  Respiratory: CTAB, normal effort Extremities: left foot with no lesions or swelling, tenderness to palpation over calcaneous MSK: tenderness to right lower back, no bony tenderness Skin: warm and dry, no rashes noted Neuro: alert and oriented, no focal deficits   Assessment & Plan:    Degenerative joint disease (DJD) of lumbar spine Continued right sided lumbar pain, no red flags on exam or history. Discussed using tramadol, tylenol for pain. Give he is on elliqus he has previously been counseled against using NSAIDs - tramadol refilled - back pain instructions given - follow as needed - consider referral to sports medicine for further treatment    Plantar fasciitis of left foot Continued plantar fasciitis pain, but now really only bothersome when wearing steel toe boots. Discussed continued exercises for plantar fascia but also wearing inserts to put in shoes as it does seem the  cushion of more supportive shoes like his sneakers is helpful. Will follow as needed    Trivia Heffelfinger A. Lincoln Brigham MD, [facility] Resident PGY-3 Pager [phone]

## 2016-11-11 NOTE — Assessment & Plan Note (Signed)
Continued right sided lumbar pain, no red flags on exam or history. Discussed using tramadol, tylenol for pain. Give he is on elliqus he has previously been counseled against using NSAIDs - tramadol refilled - back pain instructions given - follow as needed - consider referral to sports medicine for further treatment

## 2016-11-11 NOTE — Patient Instructions (Signed)
Follow up as needed Take tramadol or Tylenol for pain  Back Pain, Adult Back pain is very common. The pain often gets better over time. The cause of back pain is usually not dangerous. Most people can learn to manage their back pain on their own. Follow these instructions at home: Watch your back pain for any changes. The following actions may help to lessen any pain you are feeling:  Stay active. Start with short walks on flat ground if you can. Try to walk farther each day.  Exercise regularly as told by your doctor. Exercise helps your back heal faster. It also helps avoid future injury by keeping your muscles strong and flexible.  Do not sit, drive, or stand in one place for more than 30 minutes.  Do not stay in bed. Resting more than 1-2 days can slow down your recovery.  Be careful when you bend or lift an object. Use good form when lifting:  Bend at your knees.  Keep the object close to your body.  Do not twist.  Sleep on a firm mattress. Lie on your side, and bend your knees. If you lie on your back, put a pillow under your knees.  Take medicines only as told by your doctor.  Put ice on the injured area.  Put ice in a plastic bag.  Place a towel between your skin and the bag.  Leave the ice on for 20 minutes, 2-3 times a day for the first 2-3 days. After that, you can switch between ice and heat packs.  Avoid feeling anxious or stressed. Find good ways to deal with stress, such as exercise.  Maintain a healthy weight. Extra weight puts stress on your back. Contact a doctor if:  You have pain that does not go away with rest or medicine.  You have worsening pain that goes down into your legs or buttocks.  You have pain that does not get better in one week.  You have pain at night.  You lose weight.  You have a fever or chills. Get help right away if:  You cannot control when you poop (bowel movement) or pee (urinate).  Your arms or legs feel weak.  Your  arms or legs lose feeling (numbness).  You feel sick to your stomach (nauseous) or throw up (vomit).  You have belly (abdominal) pain.  You feel like you may pass out (faint). This information is not intended to replace advice given to you by your health care provider. Make sure you discuss any questions you have with your health care provider. Document Released: 01/08/2008 Document Revised: 12/28/2015 Document Reviewed: 11/23/2013 Elsevier Interactive Patient Education  2017 [employer].

## 2016-11-11 NOTE — Assessment & Plan Note (Signed)
Continued plantar fasciitis pain, but now really only bothersome when wearing steel toe boots. Discussed continued exercises for plantar fascia but also wearing inserts to put in shoes as it does seem the cushion of more supportive shoes like his sneakers is helpful. Will follow as needed

## 2016-11-18 ENCOUNTER — Ambulatory Visit (HOSPITAL_COMMUNITY)
Admission: RE | Admit: 2016-11-18 | Discharge: 2016-11-18 | Disposition: A | Discharge status: Home / Self Care | Payer: 59 | Source: Ambulatory Visit | Attending: Nurse Practitioner | Admitting: Nurse Practitioner

## 2016-11-18 ENCOUNTER — Encounter (HOSPITAL_COMMUNITY): Payer: Self-pay | Admitting: Nurse Practitioner

## 2016-11-18 VITALS — BP 126/84 | HR 63 | Ht 74.0 in | Wt 316.2 lb

## 2016-11-18 DIAGNOSIS — I10 Essential (primary) hypertension: Secondary | ICD-10-CM | POA: Diagnosis not present

## 2016-11-18 DIAGNOSIS — I480 Paroxysmal atrial fibrillation: Secondary | ICD-10-CM | POA: Insufficient documentation

## 2016-11-18 DIAGNOSIS — K219 Gastro-esophageal reflux disease without esophagitis: Secondary | ICD-10-CM | POA: Diagnosis not present

## 2016-11-18 DIAGNOSIS — Z9889 Other specified postprocedural states: Secondary | ICD-10-CM | POA: Diagnosis not present

## 2016-11-18 DIAGNOSIS — M109 Gout, unspecified: Secondary | ICD-10-CM | POA: Insufficient documentation

## 2016-11-18 DIAGNOSIS — Z823 Family history of stroke: Secondary | ICD-10-CM | POA: Diagnosis not present

## 2016-11-18 DIAGNOSIS — Z880 Allergy status to penicillin: Secondary | ICD-10-CM | POA: Diagnosis not present

## 2016-11-18 DIAGNOSIS — Z8249 Family history of ischemic heart disease and other diseases of the circulatory system: Secondary | ICD-10-CM | POA: Diagnosis not present

## 2016-11-18 DIAGNOSIS — Z888 Allergy status to other drugs, medicaments and biological substances status: Secondary | ICD-10-CM | POA: Diagnosis not present

## 2016-11-18 DIAGNOSIS — Z7901 Long term (current) use of anticoagulants: Secondary | ICD-10-CM | POA: Insufficient documentation

## 2016-11-18 DIAGNOSIS — Z87891 Personal history of nicotine dependence: Secondary | ICD-10-CM | POA: Insufficient documentation

## 2016-11-18 DIAGNOSIS — Z79899 Other long term (current) drug therapy: Secondary | ICD-10-CM | POA: Diagnosis not present

## 2016-11-18 DIAGNOSIS — G4733 Obstructive sleep apnea (adult) (pediatric): Secondary | ICD-10-CM | POA: Diagnosis not present

## 2016-11-18 LAB — BASIC METABOLIC PANEL
ANION GAP: 6 (ref 5–15)
BUN: 13 mg/dL (ref 6–20)
CHLORIDE: 107 mmol/L (ref 101–111)
CO2: 27 mmol/L (ref 22–32)
Calcium: 9.6 mg/dL (ref 8.9–10.3)
Creatinine, Ser: 0.91 mg/dL (ref 0.61–1.24)
GFR calc Af Amer: >60 mL/min (ref >60)
GFR calc non Af Amer: >60 mL/min (ref >60)
GLUCOSE: 104 mg/dL — AB (ref 65–99)
POTASSIUM: 4.3 mmol/L (ref 3.5–5.1)
Sodium: 140 mmol/L (ref 135–145)

## 2016-11-18 LAB — MAGNESIUM: Magnesium: 2.3 mg/dL (ref 1.7–2.4)

## 2016-11-18 NOTE — Progress Notes (Signed)
Patient ID: [name], male   DOB: 11/03/1965, 51 y.o.   MRN: [id number]     Primary Care Physician: Chrisandra Netters, MD Referring Physician: Dr. Rayann Heman, [facility] f/u   [name] is a 51 y.o. male with a h/o PAF that is here today for f/u of tikosyn. He reports that he has maintained SR other than 5 hours a week ago Sunday. It corrected itself and did not feel as bad as previous epiosdes of afib before tikosyn. He has been under treatment for gout and has continued taking Indocin sine colchicine was more expensive. He called tha offic las t week with feeling short of breath, ankle edema and h/a. I suggested  trying to minimize Indocin and possible fluid retention that may be secondary to the NSAID. He did minimize drug and feels better. Back on colchicine.   In the afib clinic, he reports doing well.Understands purpose for tokosyn, and need to take on regular schedule without missed doses. He has had tikosyn filled without issues.Continues with eliquis.QTc is stable, 447ms. K+/mag have been stable, and will be rechecked today.   F/u in the afib clinic.4/16. He is feeling well. Has lost from 344 lbs to current 316 with weight watchers and is feeling better. He does wish to have his knee replaced at some in the near future. No recent breakthrough afib. Taking tikosyn and eliquis without issues.  Today, he denies symptoms of palpitations, chest pain, shortness of breath, orthopnea, PND, lower extremity edema, dizziness, presyncope, syncope, or neurologic sequela. The patient is tolerating medications without difficulties and is otherwise without complaint today.   Past Medical History:  Diagnosis Date  . Arthritis   . Chest pain 09/12/10   Myoview-attenuation defiect in the inferior myocardium, no ischemia, low risk scan  . GERD (gastroesophageal reflux disease)   . Gout   . History of MRI of cervical spine 09/25/10   normal; left shoulder normal as well  . Hypertension   . Migraine    "about  monthly" (03/03/2015)  . Morbid obesity ([place])   . OSA on CPAP   . Paroxysmal atrial fibrillation ([place]) 7/15   chads2vasc score is 1   Past Surgical History:  Procedure Laterality Date  . CARDIAC CATHETERIZATION  05/2005   "no blockage"  . KNEE ARTHROSCOPY Right 02/2013    Current Outpatient Prescriptions  Medication Sig Dispense Refill  . dofetilide (TIKOSYN) 500 MCG capsule Take 1 capsule (500 mcg total) by mouth 2 (two) times daily. 180 capsule 1  . ELIQUIS 5 MG TABS tablet TAKE ONE TABLET BY MOUTH TWICE DAILY 60 tablet 5  . ibuprofen (ADVIL,MOTRIN) 200 MG tablet Take 800 mg by mouth every 6 (six) hours as needed (pain). Reported on 01/05/2016    . Omega-3 Fatty Acids (FISH OIL) 1000 MG CAPS Take 2,000 mg by mouth daily.     Marland Kitchen omeprazole (PRILOSEC) 20 MG capsule TAKE ONE CAPSULE BY MOUTH TWICE DAILY 60 capsule 5  . ondansetron (ZOFRAN-ODT) 8 MG disintegrating tablet Take 1 tablet (8 mg total) by mouth every 8 (eight) hours as needed for nausea or vomiting. 10 tablet 0  . PRESCRIPTION MEDICATION Inhale into the lungs at bedtime. CPAP    . traMADol (ULTRAM) 50 MG tablet Take 1 tablet (50 mg total) by mouth every 6 (six) hours as needed. 60 tablet 1   No current facility-administered medications for this encounter.     Allergies  Allergen Reactions  . Hydrochlorothiazide-Propranolol [Propranolol-Hctz] Other (See Comments)    Joints  lock up  . Penicillins Other (See Comments)    Hives Makes joints stiff and unable to move Has patient had a PCN reaction causing immediate rash, facial/tongue/throat swelling, SOB or lightheadedness with hypotension: No Has patient had a PCN reaction causing severe rash involving mucus membranes or skin necrosis: No Has patient had a PCN reaction that required hospitalization No Has patient had a PCN reaction occurring within the last 10 years: No If all of the above answers are "NO", then may proceed with Cephalosporin use.    Social History    Social History  . Marital status: Married    Spouse name: N/A  . Number of children: N/A  . Years of education: N/A   Occupational History  . Not on file.   Social History Main Topics  . Smoking status: Former Smoker    Packs/day: 1.00    Years: 10.00    Types: Cigarettes    Quit date: 04/02/1994  . Smokeless tobacco: Former Systems developer    Types: Chew     Comment: "chewed in my teens"  . Alcohol use No     Comment: "stopped in 1994"  . Drug use: No     Comment: "stopped marijuna in 1994"  . Sexual activity: Yes   Other Topics Concern  . Not on file   Social History Narrative   Lives in [place] with spouse.  3 children, all grown.   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 Atrial fibrillation Mother 18    had a stroke    ROS- All systems are reviewed and negative except as per the HPI above  Physical Exam: Vitals:   11/18/16 0938  BP: 126/84  Pulse: 63  Weight: (!) 316 lb 3.2 oz (143.4 kg)  Height: 6\' 2"  (1.88 m)    GEN- The patient is well appearing, alert and oriented x 3 today.   Head- normocephalic, atraumatic Eyes-  Sclera clear, conjunctiva pink Ears- hearing intact Oropharynx- clear Neck- supple, no JVP Lymph- no cervical lymphadenopathy Lungs- Clear to ausculation bilaterally, normal work of breathing Heart- Regular rate and rhythm, no murmurs, rubs or gallops, PMI not laterally displaced GI- soft, NT, ND, + BS Extremities- no clubbing, cyanosis, or edema MS- no significant deformity or atrophy Skin- no rash or lesion Psych- euthymic mood, full affect Neuro- strength and sensation are intact  EKG-NSR at 63 bpm, Pr int 194 ms, QRS int 92 ms, Qtc 444 ms Epic records reviewed  Assessment and Plan: 1. afib Doing well on dofetilide 500 mg bid, qtc stable Continue eliquis Bmet/mag today  2. Obesity Congratulated on weight loss    F/u in 3 months  Butch Penny C.  Jazman Reuter, [facility] [address] [place], [place] [address] [phone]

## 2016-12-04 NOTE — Addendum Note (Signed)
Encounter addended by: Precious Bard, RMA on: 12/04/2016  4:23 PM<BR>    Actions taken: Letter status changed

## 2016-12-24 ENCOUNTER — Encounter (HOSPITAL_COMMUNITY): Payer: Self-pay | Admitting: Emergency Medicine

## 2016-12-24 ENCOUNTER — Emergency Department (HOSPITAL_COMMUNITY): Payer: 59

## 2016-12-24 ENCOUNTER — Emergency Department (HOSPITAL_COMMUNITY)
Admission: EM | Admit: 2016-12-24 | Discharge: 2016-12-24 | Disposition: A | Discharge status: Home / Self Care | Payer: 59 | Attending: Emergency Medicine | Admitting: Emergency Medicine

## 2016-12-24 DIAGNOSIS — R079 Chest pain, unspecified: Secondary | ICD-10-CM | POA: Diagnosis not present

## 2016-12-24 DIAGNOSIS — Y990 Civilian activity done for income or pay: Secondary | ICD-10-CM | POA: Diagnosis not present

## 2016-12-24 DIAGNOSIS — X500XXA Overexertion from strenuous movement or load, initial encounter: Secondary | ICD-10-CM | POA: Insufficient documentation

## 2016-12-24 DIAGNOSIS — R072 Precordial pain: Secondary | ICD-10-CM | POA: Diagnosis not present

## 2016-12-24 DIAGNOSIS — Y9389 Activity, other specified: Secondary | ICD-10-CM | POA: Diagnosis not present

## 2016-12-24 DIAGNOSIS — Z87891 Personal history of nicotine dependence: Secondary | ICD-10-CM | POA: Insufficient documentation

## 2016-12-24 DIAGNOSIS — Y929 Unspecified place or not applicable: Secondary | ICD-10-CM | POA: Insufficient documentation

## 2016-12-24 DIAGNOSIS — Z7901 Long term (current) use of anticoagulants: Secondary | ICD-10-CM | POA: Insufficient documentation

## 2016-12-24 DIAGNOSIS — I10 Essential (primary) hypertension: Secondary | ICD-10-CM | POA: Diagnosis not present

## 2016-12-24 DIAGNOSIS — R001 Bradycardia, unspecified: Secondary | ICD-10-CM

## 2016-12-24 LAB — CBC
HCT: 42.1 % (ref 39.0–52.0)
Hemoglobin: 13.6 g/dL (ref 13.0–17.0)
MCH: 26.9 pg (ref 26.0–34.0)
MCHC: 32.3 g/dL (ref 30.0–36.0)
MCV: 83.4 fL (ref 78.0–100.0)
PLATELETS: 234 10*3/uL (ref 150–400)
RBC: 5.05 MIL/uL (ref 4.22–5.81)
RDW: 13.8 % (ref 11.5–15.5)
WBC: 6.5 10*3/uL (ref 4.0–10.5)

## 2016-12-24 LAB — BASIC METABOLIC PANEL
Anion gap: 7 (ref 5–15)
BUN: 10 mg/dL (ref 6–20)
CALCIUM: 9.4 mg/dL (ref 8.9–10.3)
CO2: 25 mmol/L (ref 22–32)
Chloride: 107 mmol/L (ref 101–111)
Creatinine, Ser: 0.95 mg/dL (ref 0.61–1.24)
Glucose, Bld: 105 mg/dL — ABNORMAL HIGH (ref 65–99)
Potassium: 4 mmol/L (ref 3.5–5.1)
SODIUM: 139 mmol/L (ref 135–145)

## 2016-12-24 LAB — I-STAT TROPONIN, ED
TROPONIN I, POC: 0 ng/mL (ref 0.00–0.08)
Troponin i, poc: 0 ng/mL (ref 0.00–0.08)

## 2016-12-24 MED ORDER — NITROGLYCERIN 0.4 MG SL SUBL
0.4000 mg | SUBLINGUAL_TABLET | SUBLINGUAL | Status: DC | PRN
  Administered 2016-12-24: 0.4 mg via SUBLINGUAL
  Filled 2016-12-24: qty 1

## 2016-12-24 MED ORDER — MORPHINE SULFATE (PF) 4 MG/ML IV SOLN
4.0000 mg | Freq: Once | INTRAVENOUS | Status: AC
  Administered 2016-12-24: 4 mg via INTRAVENOUS

## 2016-12-24 MED ORDER — FENTANYL CITRATE (PF) 100 MCG/2ML IJ SOLN
50.0000 ug | Freq: Once | INTRAMUSCULAR | Status: AC
  Administered 2016-12-24: 50 ug via INTRAVENOUS
  Filled 2016-12-24: qty 2

## 2016-12-24 NOTE — ED Provider Notes (Signed)
[facility] DEPT Provider Note   CSN: [id number] Arrival date & time: 12/24/16  1008     History   Chief Complaint Chief Complaint  Patient presents with  . Chest Pain    HPI [name] is a 51 y.o. male.  HPI   Patient is a 51 year old male with history of hypertension, OSA, A. fib on Eliquis  and morbid obesity presents to the ED from urgent care with complaint of chest pain. Patient reports this morning when he woke up around 5:30 AM he began having mild sharp midsternal chest pain. 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 Patient reports his chest pain is worsened with certain movements or with exertion. Denies radiation. Endorses associated intermittent shortness of breath. Denies fever, chills, headache, lightheadedness, dizziness, diaphoresis, cough, wheezing, palpitations, abdominal pain, nausea, vomiting, numbness, tingling, weakness, leg swelling. Denies taking any medications prior to arrival. Patient denies history of similar chest pain in the past. Denies personal or family history of CAD. Denies smoking. EMS administered aspirin prior to arrival however patient refused nitroglycerin due to prior hx of adverse reaction with severe migraines in the past. Denies any recent hospitalizations/surgeries, leg swelling, hx of DVT/PE, hx of cancer, hormone use.  PCP- Dr. Ardelia Mems Cardiologist- Dr. Acie Fredrickson Electrophysiologist- Dr. Rayann Heman  Past Medical History:  Diagnosis Date  . Arthritis   . Chest pain 09/12/10   Myoview-attenuation defiect in the inferior myocardium, no ischemia, low risk scan  . GERD (gastroesophageal reflux disease)   . Gout   . History of MRI of cervical spine 09/25/10   normal; left shoulder normal as well  . Hypertension   . Migraine    "about monthly" (03/03/2015)  . Morbid obesity ([place])   . OSA on CPAP   . Paroxysmal  atrial fibrillation ([place]) 7/15   chads2vasc score is 1    Patient Active Problem List   Diagnosis Date Noted  . Primary osteoarthritis of right knee 07/23/2016  . Plantar fasciitis of left foot 05/07/2016  . 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 Acute gout   . Dizziness 03/21/2015  . Shortness of breath 03/21/2015  . OSA on CPAP   . Paroxysmal atrial fibrillation ([facility])   . Palpitations   . Chest pain 03/03/2015  . Left knee pain 10/13/2014  . Atrial fibrillation ([place]) 03/14/2014  . Right knee pain 11/10/2012  . Sinusitis 10/23/2012  . Left hip pain 06/17/2011  . OSA (obstructive sleep apnea) 05/17/2011  . Chronic back pain 10/29/2010  . INSOMNIA 09/19/2008  . Gout 05/13/2007  . Hyperlipidemia 10/02/2006  . Morbid obesity ([place]) 10/02/2006  . MIGRAINE, UNSPEC., W/O INTRACTABLE MIGRAINE 10/02/2006  . HYPERTENSION, BENIGN SYSTEMIC 10/02/2006  . GASTROESOPHAGEAL REFLUX, NO ESOPHAGITIS 10/02/2006    Past Surgical History:  Procedure Laterality Date  . CARDIAC CATHETERIZATION  05/2005   "no blockage"  . KNEE ARTHROSCOPY Right 02/2013       Home Medications    Prior to Admission medications   Medication Sig Start Date End Date Taking? Authorizing Provider  dofetilide (TIKOSYN) 500 MCG capsule Take 1 capsule (500 mcg total) by mouth 2 (two) times daily. 09/25/16  Yes Allred, Jeneen Rinks, MD  ELIQUIS 5 MG TABS tablet TAKE ONE TABLET BY MOUTH TWICE DAILY 08/07/16  Yes Ardelia Mems [place]  J, MD  fluticasone (FLONASE) 50 MCG/ACT nasal spray Place 1 spray into both nostrils daily.   Yes [provider]  ibuprofen (ADVIL,MOTRIN) 200 MG tablet Take 800 mg by mouth every 6 (six) hours as needed (pain). Reported on 01/05/2016   Yes [provider]  Omega-3 Fatty Acids (FISH OIL) 1000 MG CAPS Take 2,000 mg by mouth daily.    Yes [provider]  omeprazole (PRILOSEC) 20 MG capsule  TAKE ONE CAPSULE BY MOUTH TWICE DAILY Patient taking differently: TAKE ONE CAPSULE BY MOUTH EVERY OTHER DAY 09/25/16  Yes [name], MD  ondansetron (ZOFRAN-ODT) 8 MG disintegrating tablet Take 1 tablet (8 mg total) by mouth every 8 (eight) hours as needed for nausea or vomiting. 07/17/16  Yes Davonna Belling, MD  PRESCRIPTION MEDICATION Inhale into the lungs at bedtime. CPAP   Yes [provider]  traMADol (ULTRAM) 50 MG tablet Take 1 tablet (50 mg total) by mouth every 6 (six) hours as needed. Patient taking differently: Take 50 mg by mouth every 6 (six) hours as needed for moderate pain.  11/11/16  Yes Haney, Amedeo Plenty, MD    Family History Family History  Problem Relation Age of Onset  . Atrial fibrillation Mother 79       had a stroke    Social History Social History  Substance Use Topics  . Smoking status: Former Smoker    Packs/day: 1.00    Years: 10.00    Types: Cigarettes    Quit date: 04/02/1994  . Smokeless tobacco: Former Systems developer    Types: Chew     Comment: "chewed in my teens"  . Alcohol use No     Comment: "stopped in 1994"     Allergies   Hydrochlorothiazide-propranolol [propranolol-hctz] and Penicillins   Review of Systems Review of Systems  Respiratory: Positive for shortness of breath.   Cardiovascular: Positive for chest pain.  All other systems reviewed and are negative.    Physical Exam Updated Vital Signs BP (!) 144/80   Pulse 62   Temp 97.8 F (36.6 C) (Oral)   Resp 20   Ht 6\' 2"  (1.88 m)   Wt (!) 138.8 kg (306 lb)   SpO2 99%   BMI 39.29 kg/m   Physical Exam  Constitutional: He is oriented to person, place, and time. He appears well-developed and well-nourished. No distress.  HENT:  Head: Normocephalic and atraumatic.  Mouth/Throat: Oropharynx is clear and moist. No oropharyngeal exudate.  Eyes: Conjunctivae and EOM are normal. Right eye exhibits no discharge. Left eye exhibits no discharge. No scleral icterus.  Neck:  Normal range of motion. Neck supple.  Cardiovascular: Normal rate, regular rhythm, normal heart sounds and intact distal pulses.   Pulmonary/Chest: Effort normal and breath sounds normal. No respiratory distress. He has no wheezes. He has no rales. He exhibits tenderness. He exhibits no laceration, no crepitus, no edema, no deformity, no swelling and no retraction.  TTP over midsternal anterior chest wall  Abdominal: Soft. Bowel sounds are normal. He exhibits no distension and no mass. There is no tenderness. There is no rebound and no guarding.  Musculoskeletal: Normal range of motion. He exhibits no edema.  Neurological: He is alert and oriented to person, place, and time.  Skin: Skin is warm and dry. He is not diaphoretic.  Nursing note and vitals reviewed.    ED Treatments / Results  Labs (all labs ordered are listed, but only abnormal results are displayed) Labs Reviewed  BASIC METABOLIC  PANEL - Abnormal; Notable for the following:       Result Value   Glucose, Bld 105 (*)    All other components within normal limits  CBC  I-STAT TROPOININ, ED  I-STAT TROPOININ, ED    EKG  EKG Interpretation  Date/Time:  Tuesday Dec 24 2016 10:15:49 EDT Ventricular Rate:  47 PR Interval:    QRS Duration: 103 QT Interval:  478 QTC Calculation: 423 R Axis:   6 Text Interpretation:  Sinus bradycardia No significant change since last tracing Confirmed by [facility] MD, Rosa Sanchez ([id number]) on 12/24/2016 3:38:29 PM       Radiology Dg Chest 2 View  Result Date: 12/24/2016 CLINICAL DATA:  Chest pain EXAM: CHEST  2 VIEW COMPARISON:  01/03/2016 FINDINGS: The heart size and mediastinal contours are within normal limits. Both lungs are clear. The visualized skeletal structures are unremarkable. IMPRESSION: No active cardiopulmonary disease. Electronically Signed   By: Kathreen Devoid   On: 12/24/2016 11:12    Procedures Procedures (including critical care time)  Medications Ordered in ED Medications    nitroGLYCERIN (NITROSTAT) SL tablet 0.4 mg (0.4 mg Sublingual Given 12/24/16 1431)  morphine 4 MG/ML injection 4 mg (4 mg Intravenous Given 12/24/16 1041)  fentaNYL (SUBLIMAZE) injection 50 mcg (50 mcg Intravenous Given 12/24/16 1339)     Initial Impression / Assessment and Plan / ED Course  I have reviewed the triage vital signs and the nursing notes.  Pertinent labs & imaging results that were available during my care of the patient were reviewed by me and considered in my medical decision making (see chart for details).    Patient presents with constant midsternal chest pain that started this morning and is worse with exertion. Reports intermittent shortness of breath with worsening chest pain. Patient given aspirin prior to arrival, declined nitroglycerin due to history of adverse reaction with severe migraines in the past. Patient is currently on Tikosyn and Eliquis for A. Fib. VSS. Exam revealed tenderness over midsternal chest wall. Lungs crackles tissue bilaterally. Remaining exam unremarkable. Patient given pain meds in the ED. EKG showed sinus rhythm with no acute ischemic changes. Trop negative. Labs unremarkable. CXR negative. HEART score 2.   I was notified by lab that pt's trop was negative (0.00). On reevaluation pt reports continued, unchanged CP s/p pain meds or SL nitro. Pt has remained hemodynamically stable. Delta trop negative. Discussed pt with Dr. Billy Fischer who evaluated pt in the ED. On reevaluation patient reports improvement in symptoms. Consulted cardiology who advised to have pt continue taking his Tikosyn as prescribed, follow up with cardiologist (Dr. Acie Fredrickson) outpatient for possible outpatient stress test and follow up with Roderic Palau at [facility] clinic regarding rate control. Discussed results and plan for discharge with patient. Discussed return precautions.  Final Clinical Impressions(s) / ED Diagnoses   Final diagnoses:  Chest pain, unspecified type    New  Prescriptions New Prescriptions   No medications on file       Nona Dell, Hershal Coria 12/24/16 1600    Gareth Morgan, MD 12/25/16 [phone]

## 2016-12-24 NOTE — Progress Notes (Signed)
Called by Dr. Billy Fischer in the ER for [name]. He presented with atypical chest pain - ruled-out for ACS. Noted to be bradycardic, at times in the 40's. Not complaining of fatigue or pre-syncope. He is on Tikosyn 500 mcg BID. No b-blocker. D/w Chanetta Marshall, NP with cardiac EP. Would not adjust the dose of Tikosyn - even with recent weight loss - bradycardia is not likely medication related. I personally reviewed his EKG - demonstrates sinus bradycardia - QTC 423 msec. Recommend follow-up appointment this week with Wayne Marseilles, NP in the a-fib clinic. Follow-up with with primary cardiologist Dr. Acie Fredrickson if needed for recurrent chest pain.  Pixie Casino, MD, [place]  Attending Cardiologist  Direct Dial: [phone]  Fax: [phone]  Website:  [email or web address]

## 2016-12-24 NOTE — ED Triage Notes (Signed)
Patient brought in by [facility] for chest pain, patient describes pain as "sharp stabbing in the center of the chest that gets tighter and tighter".  Patient refused nitroglycerin, received 324 mg aspirin, patient alert and oriented at this time.  Pain increases with palpation.  20g saline lock in left AC.

## 2016-12-24 NOTE — Discharge Instructions (Signed)
Continue taking your home medications as prescribed. Call your cardiologist gradual and outpatient follow-up appointment for reevaluation and further management of your chest pain and heart rate. I recommend discussing possible need for outpatient stress test for further evaluation. I also recommend following up with Roderic Palau at the A. fib clinic regarding rate control. Please return to the Emergency Department if symptoms worsen or new onset of fever, difficulty breathing, coughing up blood, new/worsening chest pain, lightheadedness, dizziness, syncope, numbness, weakness.

## 2017-01-06 ENCOUNTER — Encounter (HOSPITAL_COMMUNITY): Payer: Self-pay | Admitting: Nurse Practitioner

## 2017-01-06 ENCOUNTER — Ambulatory Visit (HOSPITAL_COMMUNITY)
Admission: RE | Admit: 2017-01-06 | Discharge: 2017-01-06 | Disposition: A | Discharge status: Home / Self Care | Payer: 59 | Source: Ambulatory Visit | Attending: Nurse Practitioner | Admitting: Nurse Practitioner

## 2017-01-06 VITALS — BP 112/84 | HR 65 | Ht 74.0 in | Wt 301.2 lb

## 2017-01-06 DIAGNOSIS — R001 Bradycardia, unspecified: Secondary | ICD-10-CM | POA: Insufficient documentation

## 2017-01-06 DIAGNOSIS — I10 Essential (primary) hypertension: Secondary | ICD-10-CM | POA: Diagnosis not present

## 2017-01-06 DIAGNOSIS — I480 Paroxysmal atrial fibrillation: Secondary | ICD-10-CM | POA: Insufficient documentation

## 2017-01-06 DIAGNOSIS — Z7901 Long term (current) use of anticoagulants: Secondary | ICD-10-CM | POA: Diagnosis not present

## 2017-01-06 DIAGNOSIS — G4733 Obstructive sleep apnea (adult) (pediatric): Secondary | ICD-10-CM | POA: Diagnosis not present

## 2017-01-06 DIAGNOSIS — M109 Gout, unspecified: Secondary | ICD-10-CM | POA: Diagnosis not present

## 2017-01-06 DIAGNOSIS — K219 Gastro-esophageal reflux disease without esophagitis: Secondary | ICD-10-CM | POA: Diagnosis not present

## 2017-01-06 DIAGNOSIS — Z87891 Personal history of nicotine dependence: Secondary | ICD-10-CM | POA: Insufficient documentation

## 2017-01-06 DIAGNOSIS — R079 Chest pain, unspecified: Secondary | ICD-10-CM | POA: Diagnosis not present

## 2017-01-06 NOTE — Progress Notes (Signed)
Patient ID: Geronimo Diliberto, male   DOB: Sep 21, 1965, 51 y.o.   MRN: [id number]     Primary Care Physician: [name], MD Referring Physician: Dr. Rayann Heman, [facility] f/u Cardiologist: Dr. Leonides Cave is a 51 y.o. male with a h/o PAF that is here today for f/u of tikosyn. He reports that he has maintained SR other than 5 hours a week ago Sunday. It corrected itself and did not feel as bad as previous epiosdes of afib before tikosyn. He has been under treatment for gout and has continued taking Indocin sine colchicine was more expensive. He called tha offic las t week with feeling short of breath, ankle edema and h/a. I suggested  trying to minimize Indocin and possible fluid retention that may be secondary to the NSAID. He did minimize drug and feels better. Back on colchicine.   In the afib clinic, he reports doing well.Understands purpose for tokosyn, and need to take on regular schedule without missed doses. He has had tikosyn filled without issues.Continues with eliquis.QTc is stable, 467ms. K+/mag have been stable, and will be rechecked today.   F/u in the afib clinic.4/16. He is feeling well. Has lost from 344 lbs to current 316 with weight watchers and is feeling better. He does wish to have his knee replaced at some in the near future. No recent breakthrough afib. Taking tikosyn and eliquis without issues.   F/u in afib clinic from ER visit, 5/22, for chest pain, sharp in nature with neg troponin. He had HR in the upper 40's there and was sent here for evaluation for brady. He is in the 60's today. No lightheadedness with the 40's heart rate but was in pain and then received MS so these things may have contributed to brady. Pt is on tikosyn, which usually does not have a significant chronotropic effect.  It was suggested for pt to come here for med adjustment but there is no rate control to adjust. He feels that the chest pain came from getting off his reflex med and is now back on  omeprazole.  No further chest pain.He has lost an additional 14 pounds since I saw him last in April and 50 lbs since starting weight watchers.Qtc interval normal  Today, he denies symptoms of palpitations, chest pain, shortness of breath, orthopnea, PND, lower extremity edema, dizziness, presyncope, syncope, or neurologic sequela. The patient is tolerating medications without difficulties and is otherwise without complaint today.   Past Medical History:  Diagnosis Date  . Arthritis   . Chest pain 09/12/10   Myoview-attenuation defiect in the inferior myocardium, no ischemia, low risk scan  . GERD (gastroesophageal reflux disease)   . Gout   . History of MRI of cervical spine 09/25/10   normal; left shoulder normal as well  . Hypertension   . Migraine    "about monthly" (03/03/2015)  . Morbid obesity ([place])   . OSA on CPAP   . Paroxysmal atrial fibrillation ([place]) 7/15   chads2vasc score is 1   Past Surgical History:  Procedure Laterality Date  . CARDIAC CATHETERIZATION  05/2005   "no blockage"  . KNEE ARTHROSCOPY Right 02/2013    Current Outpatient Prescriptions  Medication Sig Dispense Refill  . dofetilide (TIKOSYN) 500 MCG capsule Take 1 capsule (500 mcg total) by mouth 2 (two) times daily. 180 capsule 1  . ELIQUIS 5 MG TABS tablet TAKE ONE TABLET BY MOUTH TWICE DAILY 60 tablet 5  . fluticasone (FLONASE) 50 MCG/ACT nasal  spray Place 1 spray into both nostrils daily.    Marland Kitchen ibuprofen (ADVIL,MOTRIN) 200 MG tablet Take 800 mg by mouth every 6 (six) hours as needed (pain). Reported on 01/05/2016    . Omega-3 Fatty Acids (FISH OIL) 1000 MG CAPS Take 2,000 mg by mouth daily.     Marland Kitchen omeprazole (PRILOSEC) 20 MG capsule TAKE ONE CAPSULE BY MOUTH TWICE DAILY (Patient taking differently: TAKE ONE CAPSULE BY MOUTH every day) 60 capsule 5  . PRESCRIPTION MEDICATION Inhale into the lungs at bedtime. CPAP    . traMADol (ULTRAM) 50 MG tablet Take 1 tablet (50 mg total) by mouth every 6 (six) hours as  needed. (Patient taking differently: Take 50 mg by mouth every 6 (six) hours as needed for moderate pain. ) 60 tablet 1  . ondansetron (ZOFRAN-ODT) 8 MG disintegrating tablet Take 1 tablet (8 mg total) by mouth every 8 (eight) hours as needed for nausea or vomiting. (Patient not taking: Reported on 01/06/2017) 10 tablet 0   No current facility-administered medications for this encounter.     Allergies  Allergen Reactions  . Hydrochlorothiazide-Propranolol [Propranolol-Hctz] Other (See Comments)    Joints lock up  . Penicillins Other (See Comments)    Hives Makes joints stiff and unable to move Has patient had a PCN reaction causing immediate rash, facial/tongue/throat swelling, SOB or lightheadedness with hypotension: No Has patient had a PCN reaction causing severe rash involving mucus membranes or skin necrosis: No Has patient had a PCN reaction that required hospitalization No Has patient had a PCN reaction occurring within the last 10 years: No If all of the above answers are "NO", then may proceed with Cephalosporin use.    Social History   Social History  . Marital status: Married    Spouse name: N/A  . Number of children: N/A  . Years of education: N/A   Occupational History  . Not on file.   Social History Main Topics  . Smoking status: Former Smoker    Packs/day: 1.00    Years: 10.00    Types: Cigarettes    Quit date: 04/02/1994  . Smokeless tobacco: Former Systems developer    Types: Chew     Comment: "chewed in my teens"  . Alcohol use No     Comment: "stopped in 1994"  . Drug use: No     Comment: "stopped marijuna in 1994"  . Sexual activity: Yes   Other Topics Concern  . Not on file   Social History Narrative   Lives in [place] with spouse.  3 children, all grown.   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 Atrial fibrillation Mother 64       had a stroke    ROS- All systems are  reviewed and negative except as per the HPI above  Physical Exam: Vitals:   01/06/17 1107  BP: 112/84  Pulse: 65  Weight: (!) 301 lb 3.2 oz (136.6 kg)  Height: 6\' 2"  (1.88 m)    GEN- The patient is well appearing, alert and oriented x 3 today.   Head- normocephalic, atraumatic Eyes-  Sclera clear, conjunctiva pink Ears- hearing intact Oropharynx- clear Neck- supple, no JVP Lymph- no cervical lymphadenopathy Lungs- Clear to ausculation bilaterally, normal work of breathing Heart- Regular rate and rhythm, no murmurs, rubs or gallops, PMI not laterally displaced GI- soft, NT, ND, + BS Extremities- no clubbing,  cyanosis, or edema MS- no significant deformity or atrophy Skin- no rash or lesion Psych- euthymic mood, full affect Neuro- strength and sensation are intact  EKG-NSR at 65 bpm, Pr int 182 ms, QRS int 88 ms, Qtc 426 ms Epic records reviewed  Assessment and Plan: 1. afib Doing well on dofetilide 500 mg bid, qtc stable Continue eliquis Mag was wnl 4/16 and K+ at 4.0 5/22, cbc wnl, trop neg  2. Obesity Congratulated on weight loss   3. Bradycardia in ER HR in the 60's today Not sure if HR in the 40's may have been from pain, pain meds but there is no rate control meds on board to adjust and I would not adjust Tikosyn for brady  4. Chest pain It was suggested that pt have an outpt stress test and f/u with Dr. Acie Fredrickson and will arrange for  this I will have pt walk on treadmill to check for chronotropic competence for brady, but if he cannot get close to target HR , it may have to changed to an lexi myoview.   F/u in 3 months in afib clinic for surveillance of Britt. Araya Roel, [facility] [address] [place], [place] [address] [phone]

## 2017-01-08 ENCOUNTER — Telehealth (HOSPITAL_COMMUNITY): Payer: Self-pay | Admitting: *Deleted

## 2017-01-08 NOTE — Telephone Encounter (Signed)
Left message on voicemail per DPR in reference to upcoming appointment scheduled on 01/13/17 with detailed instructions given per Myocardial Perfusion Study Information Sheet for the test. LM to arrive 15 minutes early, and that it is imperative to arrive on time for appointment to keep from having the test rescheduled. If you need to cancel or reschedule your appointment, please call the office within 24 hours of your appointment. Failure to do so may result in a cancellation of your appointment, and a $50 no show fee. Phone number given for call back for any questions. Genetta Fiero Jacqueline     

## 2017-01-13 ENCOUNTER — Ambulatory Visit (HOSPITAL_COMMUNITY): Payer: 59

## 2017-01-13 ENCOUNTER — Telehealth (HOSPITAL_COMMUNITY): Payer: Self-pay | Admitting: Nurse Practitioner

## 2017-01-15 ENCOUNTER — Ambulatory Visit (HOSPITAL_COMMUNITY): Payer: 59

## 2017-01-20 NOTE — Telephone Encounter (Signed)
01/13/2017 01:38 PM Phone ([place]) Leblanc, Wayne (Self) [phone] (H)   Left Message - Called pt and lmsg for him to CB to r/s appt.     By Cherie Dark A    01/16/2017 01:28 PM Phone ([address]) Leblanc, [place] (Self) [phone] (H)   Left Message - Called pt and lmsg for him to CB to r/s his myoview.     By Verdene Rio

## 2017-01-22 ENCOUNTER — Encounter (HOSPITAL_COMMUNITY): Payer: Self-pay | Admitting: Emergency Medicine

## 2017-01-22 ENCOUNTER — Observation Stay (HOSPITAL_COMMUNITY)
Admission: EM | Admit: 2017-01-22 | Discharge: 2017-01-28 | Disposition: A | Discharge status: Home / Self Care | Payer: 59 | Attending: Family Medicine | Admitting: Family Medicine

## 2017-01-22 ENCOUNTER — Telehealth (HOSPITAL_COMMUNITY): Payer: Self-pay | Admitting: *Deleted

## 2017-01-22 ENCOUNTER — Emergency Department (HOSPITAL_COMMUNITY): Payer: 59

## 2017-01-22 DIAGNOSIS — R531 Weakness: Secondary | ICD-10-CM | POA: Diagnosis not present

## 2017-01-22 DIAGNOSIS — G4733 Obstructive sleep apnea (adult) (pediatric): Secondary | ICD-10-CM | POA: Insufficient documentation

## 2017-01-22 DIAGNOSIS — I480 Paroxysmal atrial fibrillation: Secondary | ICD-10-CM | POA: Diagnosis not present

## 2017-01-22 DIAGNOSIS — Z880 Allergy status to penicillin: Secondary | ICD-10-CM | POA: Insufficient documentation

## 2017-01-22 DIAGNOSIS — E119 Type 2 diabetes mellitus without complications: Secondary | ICD-10-CM | POA: Insufficient documentation

## 2017-01-22 DIAGNOSIS — R001 Bradycardia, unspecified: Secondary | ICD-10-CM | POA: Diagnosis not present

## 2017-01-22 DIAGNOSIS — Z79899 Other long term (current) drug therapy: Secondary | ICD-10-CM | POA: Insufficient documentation

## 2017-01-22 DIAGNOSIS — I10 Essential (primary) hypertension: Secondary | ICD-10-CM | POA: Insufficient documentation

## 2017-01-22 DIAGNOSIS — M545 Low back pain: Secondary | ICD-10-CM | POA: Insufficient documentation

## 2017-01-22 DIAGNOSIS — G4700 Insomnia, unspecified: Secondary | ICD-10-CM | POA: Diagnosis not present

## 2017-01-22 DIAGNOSIS — G43809 Other migraine, not intractable, without status migrainosus: Secondary | ICD-10-CM | POA: Diagnosis not present

## 2017-01-22 DIAGNOSIS — M5030 Other cervical disc degeneration, unspecified cervical region: Secondary | ICD-10-CM | POA: Insufficient documentation

## 2017-01-22 DIAGNOSIS — R55 Syncope and collapse: Secondary | ICD-10-CM | POA: Insufficient documentation

## 2017-01-22 DIAGNOSIS — R079 Chest pain, unspecified: Secondary | ICD-10-CM | POA: Diagnosis not present

## 2017-01-22 DIAGNOSIS — Z823 Family history of stroke: Secondary | ICD-10-CM | POA: Insufficient documentation

## 2017-01-22 DIAGNOSIS — G8929 Other chronic pain: Secondary | ICD-10-CM | POA: Insufficient documentation

## 2017-01-22 DIAGNOSIS — M109 Gout, unspecified: Secondary | ICD-10-CM | POA: Diagnosis not present

## 2017-01-22 DIAGNOSIS — K219 Gastro-esophageal reflux disease without esophagitis: Secondary | ICD-10-CM | POA: Diagnosis not present

## 2017-01-22 DIAGNOSIS — R9439 Abnormal result of other cardiovascular function study: Secondary | ICD-10-CM

## 2017-01-22 DIAGNOSIS — M62838 Other muscle spasm: Secondary | ICD-10-CM | POA: Diagnosis not present

## 2017-01-22 DIAGNOSIS — H04123 Dry eye syndrome of bilateral lacrimal glands: Secondary | ICD-10-CM | POA: Diagnosis not present

## 2017-01-22 DIAGNOSIS — R0602 Shortness of breath: Secondary | ICD-10-CM | POA: Insufficient documentation

## 2017-01-22 DIAGNOSIS — R200 Anesthesia of skin: Secondary | ICD-10-CM

## 2017-01-22 DIAGNOSIS — R0789 Other chest pain: Secondary | ICD-10-CM | POA: Diagnosis not present

## 2017-01-22 DIAGNOSIS — G513 Clonic hemifacial spasm: Secondary | ICD-10-CM | POA: Diagnosis not present

## 2017-01-22 DIAGNOSIS — R42 Dizziness and giddiness: Secondary | ICD-10-CM | POA: Insufficient documentation

## 2017-01-22 DIAGNOSIS — H5710 Ocular pain, unspecified eye: Secondary | ICD-10-CM | POA: Insufficient documentation

## 2017-01-22 DIAGNOSIS — M170 Bilateral primary osteoarthritis of knee: Secondary | ICD-10-CM | POA: Diagnosis not present

## 2017-01-22 DIAGNOSIS — Z888 Allergy status to other drugs, medicaments and biological substances status: Secondary | ICD-10-CM | POA: Insufficient documentation

## 2017-01-22 DIAGNOSIS — Z7982 Long term (current) use of aspirin: Secondary | ICD-10-CM | POA: Insufficient documentation

## 2017-01-22 DIAGNOSIS — Z6838 Body mass index (BMI) 38.0-38.9, adult: Secondary | ICD-10-CM | POA: Insufficient documentation

## 2017-01-22 DIAGNOSIS — R253 Fasciculation: Secondary | ICD-10-CM | POA: Diagnosis not present

## 2017-01-22 DIAGNOSIS — E7800 Pure hypercholesterolemia, unspecified: Secondary | ICD-10-CM | POA: Insufficient documentation

## 2017-01-22 DIAGNOSIS — Z7901 Long term (current) use of anticoagulants: Secondary | ICD-10-CM | POA: Insufficient documentation

## 2017-01-22 DIAGNOSIS — M5136 Other intervertebral disc degeneration, lumbar region: Secondary | ICD-10-CM | POA: Insufficient documentation

## 2017-01-22 DIAGNOSIS — R51 Headache: Secondary | ICD-10-CM | POA: Diagnosis not present

## 2017-01-22 DIAGNOSIS — Z87891 Personal history of nicotine dependence: Secondary | ICD-10-CM | POA: Insufficient documentation

## 2017-01-22 DIAGNOSIS — H5712 Ocular pain, left eye: Secondary | ICD-10-CM | POA: Insufficient documentation

## 2017-01-22 DIAGNOSIS — R072 Precordial pain: Secondary | ICD-10-CM | POA: Diagnosis not present

## 2017-01-22 LAB — CBC WITH DIFFERENTIAL/PLATELET
Basophils Absolute: 0 10*3/uL (ref 0.0–0.1)
Basophils Relative: 1 %
EOS PCT: 2 %
Eosinophils Absolute: 0.2 10*3/uL (ref 0.0–0.7)
HEMATOCRIT: 41.6 % (ref 39.0–52.0)
Hemoglobin: 13.8 g/dL (ref 13.0–17.0)
LYMPHS PCT: 36 %
Lymphs Abs: 2.8 10*3/uL (ref 0.7–4.0)
MCH: 27 pg (ref 26.0–34.0)
MCHC: 33.2 g/dL (ref 30.0–36.0)
MCV: 81.4 fL (ref 78.0–100.0)
MONO ABS: 0.5 10*3/uL (ref 0.1–1.0)
MONOS PCT: 6 %
NEUTROS ABS: 4.3 10*3/uL (ref 1.7–7.7)
Neutrophils Relative %: 55 %
PLATELETS: 251 10*3/uL (ref 150–400)
RBC: 5.11 MIL/uL (ref 4.22–5.81)
RDW: 13.2 % (ref 11.5–15.5)
WBC: 7.9 10*3/uL (ref 4.0–10.5)

## 2017-01-22 LAB — BASIC METABOLIC PANEL
Anion gap: 7 (ref 5–15)
BUN: 13 mg/dL (ref 6–20)
CALCIUM: 9.5 mg/dL (ref 8.9–10.3)
CHLORIDE: 105 mmol/L (ref 101–111)
CO2: 26 mmol/L (ref 22–32)
Creatinine, Ser: 1.04 mg/dL (ref 0.61–1.24)
GFR calc non Af Amer: >60 mL/min (ref >60)
Glucose, Bld: 100 mg/dL — ABNORMAL HIGH (ref 65–99)
Potassium: 4.1 mmol/L (ref 3.5–5.1)
SODIUM: 138 mmol/L (ref 135–145)

## 2017-01-22 LAB — TROPONIN I

## 2017-01-22 LAB — I-STAT TROPONIN, ED: Troponin i, poc: 0 ng/mL (ref 0.00–0.08)

## 2017-01-22 MED ORDER — DOFETILIDE 500 MCG PO CAPS
500.0000 ug | ORAL_CAPSULE | Freq: Two times a day (BID) | ORAL | Status: DC
  Administered 2017-01-22 – 2017-01-28 (×12): 500 ug via ORAL
  Filled 2017-01-22 (×12): qty 1

## 2017-01-22 MED ORDER — ACETAMINOPHEN 325 MG PO TABS
650.0000 mg | ORAL_TABLET | ORAL | Status: DC | PRN
  Administered 2017-01-23 – 2017-01-27 (×4): 650 mg via ORAL
  Filled 2017-01-22 (×4): qty 2

## 2017-01-22 MED ORDER — IBUPROFEN 800 MG PO TABS
800.0000 mg | ORAL_TABLET | Freq: Once | ORAL | Status: AC
  Administered 2017-01-22: 800 mg via ORAL
  Filled 2017-01-22: qty 1

## 2017-01-22 MED ORDER — ACETAMINOPHEN 500 MG PO TABS
1000.0000 mg | ORAL_TABLET | Freq: Once | ORAL | Status: AC
  Administered 2017-01-22: 1000 mg via ORAL
  Filled 2017-01-22: qty 2

## 2017-01-22 MED ORDER — GI COCKTAIL ~~LOC~~: 30.0000 mL | Freq: Four times a day (QID) | ORAL | Status: DC | PRN

## 2017-01-22 MED ORDER — APIXABAN 5 MG PO TABS
5.0000 mg | ORAL_TABLET | Freq: Two times a day (BID) | ORAL | Status: DC
  Administered 2017-01-22 – 2017-01-24 (×4): 5 mg via ORAL
  Filled 2017-01-22 (×5): qty 1

## 2017-01-22 MED ORDER — ONDANSETRON HCL 4 MG/2ML IJ SOLN
4.0000 mg | Freq: Four times a day (QID) | INTRAMUSCULAR | Status: DC | PRN
  Administered 2017-01-24: 4 mg via INTRAVENOUS
  Filled 2017-01-22: qty 2

## 2017-01-22 NOTE — Consult Note (Addendum)
NEURO HOSPITALIST CONSULT NOTE   Requestig physician: Dr. Gwendlyn Deutscher   Reason for Consult:Left temporal hemifacial spasm   History obtained from:  Patient     HPI:                                                                                                                                          [name] is an 51 y.o. male with history of paroxysmal A. fib migraine hypertension.  Patient has been experiencing chest pain for the last 24 hours. This is the reason patient initially came to the ED. Patient works at the dock as a Insurance underwriter but does not do anything with significant eyestrain. I'll patient was in the ED it was also noted that he was having small spasms in the left temporal region which is not normal for him. Patient does have a history of migraine but states this is not like any migraine he's ever had. Throughout the day the hemifacial spasm up in the left temporal region was causing him significant discomfort and increasing his headache. While in the ED he was giving Tylenol and Motrin which seemed to have helped. Neurology was consultative for the left spasms that were located in the left temporal region. On consultation patient had no spasms to be noted and stated that they had stopped. He still is tender over that region on palpation but states this is because of the constant spasms that he has been having. Patient denies any visual discomfort, diplopia, blurred vision, inability to move his eyes in any direction, jaw claudication, scalp claudication, extremity numbness or tingling. He also denies any seizure history. Patient denies taking any illicit drugs but does say he was playing begins with his family member the other night which might a cause my strain.  Past Medical History:  Diagnosis Date  . Arthritis   . Chest pain 09/12/10   Myoview-attenuation defiect in the inferior myocardium, no ischemia, low risk scan  . GERD (gastroesophageal reflux disease)   .  Gout   . History of MRI of cervical spine 09/25/10   normal; left shoulder normal as well  . Hypertension   . Migraine    "about monthly" (03/03/2015)  . Morbid obesity ([place])   . OSA on CPAP   . Paroxysmal atrial fibrillation ([place]) 7/15   chads2vasc score is 1    Past Surgical History:  Procedure Laterality Date  . CARDIAC CATHETERIZATION  05/2005   "no blockage"  . KNEE ARTHROSCOPY Right 02/2013    Family History  Problem Relation Age of Onset  . Atrial fibrillation Mother 37       had a stroke     Social History:  reports that he quit smoking about 22 years ago. His smoking use included Cigarettes. He  has a 10.00 pack-year smoking history. He has quit using smokeless tobacco. His smokeless tobacco use included Chew. He reports that he does not drink alcohol or use drugs.  Allergies  Allergen Reactions  . Hydrochlorothiazide-Propranolol [Propranolol-Hctz] Other (See Comments)    Joints lock up  . Penicillins Other (See Comments)    Hives Makes joints stiff and unable to move Has patient had a PCN reaction causing immediate rash, facial/tongue/throat swelling, SOB or lightheadedness with hypotension: No Has patient had a PCN reaction causing severe rash involving mucus membranes or skin necrosis: No Has patient had a PCN reaction that required hospitalization No Has patient had a PCN reaction occurring within the last 10 years: No If all of the above answers are "NO", then may proceed with Cephalosporin use.    MEDICATIONS:                                                                                                                     Prior to Admission:  Prescriptions Prior to Admission  Medication Sig Dispense Refill Last Dose  . aspirin-acetaminophen-caffeine (EXCEDRIN MIGRAINE) 250-250-65 MG tablet Take 3 tablets by mouth every 6 (six) hours as needed for headache.   01/20/2017  . dofetilide (TIKOSYN) 500 MCG capsule Take 1 capsule (500 mcg total) by mouth 2 (two)  times daily. 180 capsule 1 01/22/2017 at 0600  . ELIQUIS 5 MG TABS tablet TAKE ONE TABLET BY MOUTH TWICE DAILY 60 tablet 5 01/22/2017 at 0600  . fluticasone (FLONASE) 50 MCG/ACT nasal spray Place 1 spray into both nostrils daily.   01/21/2017 at Unknown time  . Hypromellose ([employer] GENTLE LUBRICANT EYE DROPS OP) Place 1 drop into both eyes daily as needed (dry eyes).   Past Week at Unknown time  . Omega-3 Fatty Acids (FISH OIL) 1000 MG CAPS Take 2,000 mg by mouth daily.    01/22/2017 at Unknown time  . omeprazole (PRILOSEC) 20 MG capsule TAKE ONE CAPSULE BY MOUTH TWICE DAILY (Patient taking differently: TAKE ONE CAPSULE BY MOUTH every day) 60 capsule 5 01/22/2017 at Unknown time  . PRESCRIPTION MEDICATION Inhale into the lungs at bedtime. CPAP   01/21/2017 at Unknown time  . TURMERIC PO Take 1 tablet by mouth every Monday, Wednesday, and Friday.   01/20/2017  . ibuprofen (ADVIL,MOTRIN) 200 MG tablet Take 800 mg by mouth every 6 (six) hours as needed (pain). Reported on 01/05/2016   prn  . ondansetron (ZOFRAN-ODT) 8 MG disintegrating tablet Take 1 tablet (8 mg total) by mouth every 8 (eight) hours as needed for nausea or vomiting. 10 tablet 0 prn   Scheduled:   ROS:  History obtained from the patient  General ROS: negative for - chills, fatigue, fever, night sweats, weight gain or weight loss Psychological ROS: negative for - behavioral disorder, hallucinations, memory difficulties, mood swings or suicidal ideation Ophthalmic ROS: negative for - blurry vision, double vision, eye pain or loss of vision ENT ROS: negative for - epistaxis, nasal discharge, oral lesions, sore throat, tinnitus or vertigo Allergy and Immunology ROS: negative for - hives or itchy/watery eyes Hematological and Lymphatic ROS: negative for - bleeding problems, bruising or swollen lymph nodes Endocrine  ROS: negative for - galactorrhea, hair pattern changes, polydipsia/polyuria or temperature intolerance Respiratory ROS: negative for - cough, hemoptysis, shortness of breath or wheezing Cardiovascular ROS: negative for - chest pain, dyspnea on exertion, edema or irregular heartbeat Gastrointestinal ROS: negative for - abdominal pain, diarrhea, hematemesis, nausea/vomiting or stool incontinence Genito-Urinary ROS: negative for - dysuria, hematuria, incontinence or urinary frequency/urgency Musculoskeletal ROS: negative for - joint swelling or muscular weakness Neurological ROS: as noted in HPI Dermatological ROS: negative for rash and skin lesion changes   Blood pressure 129/75, pulse (!) 50, temperature 97.9 F (36.6 C), temperature source Oral, resp. rate (!) 22, height 6\' 2"  (1.88 m), weight 134.7 kg (297 lb), SpO2 96 %.   Neurologic Examination:                                                                                                      HEENT-  Normocephalic, no lesions, without obvious abnormality.  Normal external eye and conjunctiva.  Normal TM's bilaterally.  Normal auditory canals and external ears. Normal external nose, mucus membranes and septum.  Normal pharynx. Cardiovascular- S1, S2 normal, pulses palpable throughout   Lungs- chest clear, no wheezing, rales, normal symmetric air entry Abdomen- normal findings: bowel sounds normal Extremities- no edema Lymph-no adenopathy palpable Musculoskeletal-no joint tenderness, deformity or swelling Skin-warm and dry, no hyperpigmentation, vitiligo, or suspicious lesions  Neurological Examination Mental Status: Alert, oriented, thought content appropriate.  Speech fluent without evidence of aphasia.  Able to follow 3 step commands without difficulty. Cranial Nerves: II: Discs flat bilaterally; Visual fields grossly normal,  III,IV, VI: ptosis not present, extra-ocular motions intact bilaterally pupils equal, round, reactive to  light and accommodation V,VII: smile symmetric, facial light touch sensation normal bilaterally VIII: hearing normal bilaterally IX,X: uvula rises symmetrically XI: bilateral shoulder shrug XII: midline tongue extension --Negative for palpable temporal artery Motor: Right : Upper extremity   5/5    Left:     Upper extremity   5/5  Lower extremity   5/5     Lower extremity   5/5 Tone and bulk:normal tone throughout; no atrophy noted Sensory: Pinprick and light touch intact throughout, bilaterally Deep Tendon Reflexes: 2+ and symmetric throughout Plantars: Right: downgoing   Left: downgoing Cerebellar: normal finger-to-nose,  and normal heel-to-shin test Gait: Not tested      Lab Results: Basic Metabolic Panel:  Recent Labs Lab 01/22/17 1057  NA 138  K 4.1  CL 105  CO2 26  GLUCOSE 100*  BUN 13  CREATININE 1.04  CALCIUM  9.5    Liver Function Tests: No results for input(s): AST, ALT, ALKPHOS, BILITOT, PROT, ALBUMIN in the last 168 hours. No results for input(s): LIPASE, AMYLASE in the last 168 hours. No results for input(s): AMMONIA in the last 168 hours.  CBC:  Recent Labs Lab 01/22/17 1057  WBC 7.9  NEUTROABS 4.3  HGB 13.8  HCT 41.6  MCV 81.4  PLT 251    Cardiac Enzymes: No results for input(s): CKTOTAL, CKMB, CKMBINDEX, TROPONINI in the last 168 hours.  Lipid Panel: No results for input(s): CHOL, TRIG, HDL, CHOLHDL, VLDL, LDLCALC in the last 168 hours.  CBG: No results for input(s): GLUCAP in the last 168 hours.  Microbiology: Results for orders placed or performed during the hospital encounter of 07/17/16  Culture, body fluid-bottle     Status: None   Collection Time: 07/17/16  9:10 AM  Result Value Ref Range Status   Specimen Description FLUID RIGHT KNEE JOINT FLUID  Final   Special Requests NONE  Final   Culture NO GROWTH 5 DAYS  Final   Report Status 07/22/2016 FINAL  Final  Gram stain     Status: None   Collection Time: 07/17/16  9:10 AM   Result Value Ref Range Status   Specimen Description FLUID RIGHT KNEE JOINT FLUID  Final   Special Requests NONE  Final   Gram Stain   Final    ABUNDANT WBC PRESENT, PREDOMINANTLY PMN NO ORGANISMS SEEN    Report Status 07/17/2016 FINAL  Final    Coagulation Studies: No results for input(s): LABPROT, INR in the last 72 hours.  Imaging: Dg Chest 2 View  Result Date: 01/22/2017 CLINICAL DATA:  Chest pain. EXAM: CHEST  2 VIEW COMPARISON:  12/24/2016 . FINDINGS: Mediastinum and hilar structures normal. Low lung volumes with mild basilar atelectasis. No pleural effusion or pneumothorax. No acute bony abnormality. IMPRESSION: Low lung volumes with mild basilar atelectasis. Electronically Signed   By: Lake   On: 01/22/2017 11:37       Assessment and plan per attending neurologist  Etta Quill PA-C [facility] [phone]  01/22/2017, 4:13 PM   Assessment/Plan: This is a 51 year old male presenting to the emergency department with chest pain. While in the emergency department he was noted to have spasms of his left temporal region. 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 With resolution this point, I would not pursue any further workup and neurology will sign off.  Roland Rack, MD [facility] [phone]  If 7pm- 7am, please page neurology on call as listed in [place].

## 2017-01-22 NOTE — ED Provider Notes (Addendum)
[facility] DEPT Provider Note   CSN: [id number] Arrival date & time: 01/22/17  1048     History   Chief Complaint Chief Complaint  Patient presents with  . Chest Pain    HPI [name] is a 51 y.o. male.  Two episodes of left chest pain the past 24 hours, yesterday a.m. and today a.m. Pain is described as sharp and radiating to left arm. Review systems positive for dyspnea, but no diaphoresis or nausea. He works as a Physicist, medical. Past medical history includes atrial fibrillation, GERD; no hypertension or diabetes. His pulse has been in the 40-50 range recently. It was recommended that he had a stress test, but this has not been performed yet. He has recently lost 40 pounds.      Past Medical History:  Diagnosis Date  . Arthritis   . Chest pain 09/12/10   Myoview-attenuation defiect in the inferior myocardium, no ischemia, low risk scan  . GERD (gastroesophageal reflux disease)   . Gout   . History of MRI of cervical spine 09/25/10   normal; left shoulder normal as well  . Hypertension   . Migraine    "about monthly" (03/03/2015)  . Morbid obesity ([place])   . OSA on CPAP   . Paroxysmal atrial fibrillation ([place]) 7/15   chads2vasc score is 1    Patient Active Problem List   Diagnosis Date Noted  . Primary osteoarthritis of right knee 07/23/2016  . Plantar fasciitis of left foot 05/07/2016  . 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 Acute gout   . Dizziness 03/21/2015  . Shortness of breath 03/21/2015  . OSA on CPAP   . Paroxysmal atrial fibrillation ([facility])   . Palpitations   . Chest pain 03/03/2015  . Left knee pain 10/13/2014  . Atrial fibrillation ([place]) 03/14/2014  . Right knee pain 11/10/2012  . Sinusitis 10/23/2012  . Left hip pain 06/17/2011  . OSA (obstructive sleep apnea) 05/17/2011  . Chronic back pain 10/29/2010  . INSOMNIA 09/19/2008  . Gout 05/13/2007  .  Hyperlipidemia 10/02/2006  . Morbid obesity ([place]) 10/02/2006  . MIGRAINE, UNSPEC., W/O INTRACTABLE MIGRAINE 10/02/2006  . HYPERTENSION, BENIGN SYSTEMIC 10/02/2006  . GASTROESOPHAGEAL REFLUX, NO ESOPHAGITIS 10/02/2006    Past Surgical History:  Procedure Laterality Date  . CARDIAC CATHETERIZATION  05/2005   "no blockage"  . KNEE ARTHROSCOPY Right 02/2013       Home Medications    Prior to Admission medications   Medication Sig Start Date End Date Taking? Authorizing Provider  dofetilide (TIKOSYN) 500 MCG capsule Take 1 capsule (500 mcg total) by mouth 2 (two) times daily. 09/25/16   Thompson Grayer, MD  ELIQUIS 5 MG TABS tablet TAKE ONE TABLET BY MOUTH TWICE DAILY 08/07/16   [name], MD  fluticasone [facility]) 50 MCG/ACT nasal spray Place 1 spray into both nostrils daily.    [provider]  ibuprofen (ADVIL,MOTRIN) 200 MG tablet Take 800 mg by mouth every 6 (six) hours as needed (pain). Reported on 01/05/2016    [provider]  Omega-3 Fatty Acids (FISH OIL) 1000 MG CAPS Take 2,000 mg by mouth daily.     [provider]  omeprazole (PRILOSEC) 20 MG capsule TAKE ONE CAPSULE BY MOUTH TWICE DAILY Patient taking differently: TAKE ONE CAPSULE BY MOUTH every day 09/25/16   [name], MD  ondansetron (ZOFRAN-ODT) 8 MG disintegrating tablet Take 1 tablet (8  mg total) by mouth every 8 (eight) hours as needed for nausea or vomiting. Patient not taking: Reported on 01/06/2017 07/17/16   Davonna Belling, MD  PRESCRIPTION MEDICATION Inhale into the lungs at bedtime. CPAP    [provider]  traMADol (ULTRAM) 50 MG tablet Take 1 tablet (50 mg total) by mouth every 6 (six) hours as needed. Patient taking differently: Take 50 mg by mouth every 6 (six) hours as needed for moderate pain.  11/11/16   Veatrice Bourbon, MD    Family History Family History  Problem Relation Age of Onset  . Atrial fibrillation Mother 56       had a stroke    Social  History Social History  Substance Use Topics  . Smoking status: Former Smoker    Packs/day: 1.00    Years: 10.00    Types: Cigarettes    Quit date: 04/02/1994  . Smokeless tobacco: Former Systems developer    Types: Chew     Comment: "chewed in my teens"  . Alcohol use No     Comment: "stopped in 1994"     Allergies   Hydrochlorothiazide-propranolol [propranolol-hctz] and Penicillins   Review of Systems Review of Systems  All other systems reviewed and are negative.    Physical Exam Updated Vital Signs BP 125/86 (BP Location: Right Arm)   Pulse 62   Temp 97.9 F (36.6 C) (Oral)   Resp 16   Ht 6\' 2"  (1.88 m)   Wt 134.7 kg (297 lb)   SpO2 96%   BMI 38.13 kg/m   Physical Exam  Constitutional: He is oriented to person, place, and time. He appears well-developed and well-nourished.  HENT:  Head: Normocephalic and atraumatic.  Eyes: Conjunctivae are normal.  Neck: Neck supple.  Cardiovascular:  Bradycardic  Pulmonary/Chest: Effort normal and breath sounds normal.  Abdominal: Soft. Bowel sounds are normal.  Musculoskeletal: Normal range of motion.  Neurological: He is alert and oriented to person, place, and time.  Skin: Skin is warm and dry.  Psychiatric: He has a normal mood and affect. His behavior is normal.  Nursing note and vitals reviewed.    ED Treatments / Results  Labs (all labs ordered are listed, but only abnormal results are displayed) Labs Reviewed  BASIC METABOLIC PANEL - Abnormal; Notable for the following:       Result Value   Glucose, Bld 100 (*)    All other components within normal limits  CBC WITH DIFFERENTIAL/PLATELET  Randolm Idol, ED    EKG  EKG Interpretation  Date/Time:  Wednesday January 22 2017 10:54:20 EDT Ventricular Rate:  59 PR Interval:  182 QRS Duration: 96 QT Interval:  424 QTC Calculation: 419 R Axis:   7 Text Interpretation:  Sinus bradycardia Otherwise normal ECG Confirmed by Nat Christen [phone]) on 01/22/2017 1:02:28 PM        Radiology Dg Chest 2 View  Result Date: 01/22/2017 CLINICAL DATA:  Chest pain. EXAM: CHEST  2 VIEW COMPARISON:  12/24/2016 . FINDINGS: Mediastinum and hilar structures normal. Low lung volumes with mild basilar atelectasis. No pleural effusion or pneumothorax. No acute bony abnormality. IMPRESSION: Low lung volumes with mild basilar atelectasis. Electronically Signed   By: Marcello Moores  Register   On: 01/22/2017 11:37    Procedures Procedures (including critical care time)  Medications Ordered in ED Medications  acetaminophen (TYLENOL) tablet 1,000 mg (not administered)  ibuprofen (ADVIL,MOTRIN) tablet 800 mg (not administered)     Initial Impression / Assessment and Plan / ED  Course  I have reviewed the triage vital signs and the nursing notes.  Pertinent labs & imaging results that were available during my care of the patient were reviewed by me and considered in my medical decision making (see chart for details).   Patient is hemodynamically stable; However, his symptoms could be consistent with anginal pain. Will consult family practice.  Probable admission    Final Clinical Impressions(s) / ED Diagnoses   Final diagnoses:  Chest pain, unspecified type    New Prescriptions New Prescriptions   No medications on file     Nat Christen, MD 01/22/17 1345    Nat Christen, MD 01/22/17 1345    Nat Christen, MD 01/22/17 [place]    Nat Christen, MD 01/22/17 1435

## 2017-01-22 NOTE — Telephone Encounter (Signed)
Pt called in stating he is at work feeling as if he could pass out, dizzy, feels his HR is low and chest pain. He has been staying hydrated he reports. He had chest pain on and off yesterday at home. Pt is scheduled for stress test in few weeks. Instructed pt he should report to ER for symptoms since chest pain is with exertion and without. Pt verbalized understanding.

## 2017-01-22 NOTE — ED Triage Notes (Signed)
Pt reports new onset intermittent chest pain radiating to left side with left arm numbness starting yesterday. Pt reports some shortness of breath, not at present, and some dizziness.  Pt has a hx of afib and takes meds daily for this.  Pain currently 6/10.

## 2017-01-22 NOTE — Procedures (Signed)
CPAP for bedside and ready for use.  Patient stated he will place on himself when ready for bed.  Patient is aware to ask RN to call Respiratory if he needs any further assistance.

## 2017-01-22 NOTE — ED Notes (Signed)
Nurse giving report upstairs will call back

## 2017-01-22 NOTE — H&P (Signed)
[facility] Admission History and Physical Service Pager: [phone]  Patient name: [name] Medical record number: [id number] Date of birth: Sep 05, 1965 Age: 51 y.o. Gender: male  Primary Care Provider: [name], MD Consultants: none Code Status: full  Chief Complaint: chest pain  Assessment and Plan: [name] is a 51 y.o. male presenting with chest pain. PMH is significant for afib on eliquis, OSA, chronic back pain, HTN, HLD, migraines, obesity, and GERD.  Chest pain- symptoms concerning for cardiac etiology. Heart score is 4. I stat troponin 0 in ED. EKG with sinus bradycardia, no ST changes or T wave changes. Risk factors include HTN, HLD, former smoking, obesity. Last lipid panel October 2017, A1C 5.09 February 2015. Non-smoker.  -place in observation, attending Dr. Gwendlyn Deutscher -monitor on telemetry -vitals per floor -trend troponin -am EKG -am CBC, BMP -consult cardiology in am  Near-syncope- Additional episode of syncope once before several years ago. He has bradycardic episodes in past where he feels he may syncopize. -carotid dopplers -cardiology c/s as above  Headache- left temporal muscle twitching starting this morning. Persistent with accompanying eye pain and some blurred vision. Concerning for temporal arteritis vs symptomatic migraine vs TIA as patient also reports left sided numbness of face, arm and leg.  -neurology consulted, appreciate recs -obtain ESR, CRP -risk strat labs - A1C, lipid panel -consider brain imaging  Afib- on eliquis and tikosyn, currently in NSR -tele -continue eliquis and tikosyn  -cards c/s as above  OSA -uses CPAP at night, continue in hospital  FEN/GI: heart healthy diet, PPI Prophylaxis: eliquis  Disposition: place in observation  History of Present Illness:  [name] is a 51 y.o. male presenting with chest pain and near syncope, headache.  Yesterday while showering he had chest pain and  felt extremely lightheaded like he was going to pass out. Later that day he felt like he may pass out again but did not tell his wife and went about his day.   Today woke up, he was tired but went to work. Works at [employer] [place]. It was very hot out. He ate breakfast, drinks plenty of fluids. He experienced chest pain, SOB, diaphoresis, and left arm went numb. He felt like he was going to pass out. Coworkers got him inside. He called his doctor he sees for a fib and they advised him to come to the ED. He denies feeling palpitations or his heart beating funny. He has issues with his heart rate getting too low and feeling like he was going to pass out in the past. Was scheduled for outpatient stress test in early July.  He also complains of headache over his left temple that started this morning. The muscle there is twitching and painful. Left eye is tearing and hurts as well. Vision is blurry, more so than usual (wears glasses). He got tylenol and ibuprofen in ED with no relief of headache. Has history of migraines. He also reports left sided facial numbness, left arm and leg numbness.   Review Of Systems: Per HPI with the following additions:   Review of Systems  Constitutional: Positive for diaphoresis. Negative for chills and fever.  HENT: Positive for nosebleeds. Negative for congestion, ear discharge, ear pain and hearing loss.   Eyes: Positive for blurred vision and pain.  Respiratory: Positive for shortness of breath. Negative for cough and wheezing.   Cardiovascular: Positive for chest pain and palpitations. Negative for orthopnea and leg swelling.  Gastrointestinal: Negative  for abdominal pain, constipation, diarrhea, nausea and vomiting.  Genitourinary: Negative for dysuria and frequency.  Neurological: Positive for dizziness, sensory change, weakness and headaches.    Patient Active Problem List   Diagnosis Date Noted  . Primary osteoarthritis of right knee 07/23/2016  .  Plantar fasciitis of left foot 05/07/2016  . 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 Acute gout   . Dizziness 03/21/2015  . Shortness of breath 03/21/2015  . OSA on CPAP   . Paroxysmal atrial fibrillation (HCC)   . Palpitations   . Chest pain 03/03/2015  . Left knee pain 10/13/2014  . Atrial fibrillation ([place]) 03/14/2014  . Right knee pain 11/10/2012  . Sinusitis 10/23/2012  . Left hip pain 06/17/2011  . OSA (obstructive sleep apnea) 05/17/2011  . Chronic back pain 10/29/2010  . INSOMNIA 09/19/2008  . Gout 05/13/2007  . Hyperlipidemia 10/02/2006  . Morbid obesity ([place]) 10/02/2006  . MIGRAINE, UNSPEC., W/O INTRACTABLE MIGRAINE 10/02/2006  . HYPERTENSION, BENIGN SYSTEMIC 10/02/2006  . GASTROESOPHAGEAL REFLUX, NO ESOPHAGITIS 10/02/2006    Past Medical History: Past Medical History:  Diagnosis Date  . Arthritis   . Chest pain 09/12/10   Myoview-attenuation defiect in the inferior myocardium, no ischemia, low risk scan  . GERD (gastroesophageal reflux disease)   . Gout   . History of MRI of cervical spine 09/25/10   normal; left shoulder normal as well  . Hypertension   . Migraine    "about monthly" (03/03/2015)  . Morbid obesity ([place])   . OSA on CPAP   . Paroxysmal atrial fibrillation ([facility]) 7/15   chads2vasc score is 1    Past Surgical History: Past Surgical History:  Procedure Laterality Date  . CARDIAC CATHETERIZATION  05/2005   "no blockage"  . KNEE ARTHROSCOPY Right 02/2013    Social History: Social History  Substance Use Topics  . Smoking status: Former Smoker    Packs/day: 1.00    Years: 10.00    Types: Cigarettes    Quit date: 04/02/1994  . Smokeless tobacco: Former Systems developer    Types: Chew     Comment: "chewed in my teens"  . Alcohol use No     Comment: "stopped in 1994"   Additional social history: no drug use, lives with Wife  Please also refer to relevant sections of  EMR.  Family History: Family History  Problem Relation Age of Onset  . Atrial fibrillation Mother 63       had a stroke   Father HTN, DM  Allergies and Medications: Allergies  Allergen Reactions  . Hydrochlorothiazide-Propranolol [Propranolol-Hctz] Other (See Comments)    Joints lock up  . Penicillins Other (See Comments)    Hives Makes joints stiff and unable to move Has patient had a PCN reaction causing immediate rash, facial/tongue/throat swelling, SOB or lightheadedness with hypotension: No Has patient had a PCN reaction causing severe rash involving mucus membranes or skin necrosis: No Has patient had a PCN reaction that required hospitalization No Has patient had a PCN reaction occurring within the last 10 years: No If all of the above answers are "NO", then may proceed with Cephalosporin use.   No current facility-administered medications on file prior to encounter.    Current Outpatient Prescriptions on File Prior to Encounter  Medication Sig Dispense Refill  . dofetilide (TIKOSYN) 500 MCG capsule Take 1 capsule (500 mcg total) by mouth 2 (two) times daily. 180 capsule 1  .  ELIQUIS 5 MG TABS tablet TAKE ONE TABLET BY MOUTH TWICE DAILY 60 tablet 5  . fluticasone (FLONASE) 50 MCG/ACT nasal spray Place 1 spray into both nostrils daily.    . Omega-3 Fatty Acids (FISH OIL) 1000 MG CAPS Take 2,000 mg by mouth daily.     Marland Kitchen omeprazole (PRILOSEC) 20 MG capsule TAKE ONE CAPSULE BY MOUTH TWICE DAILY (Patient taking differently: TAKE ONE CAPSULE BY MOUTH every day) 60 capsule 5  . PRESCRIPTION MEDICATION Inhale into the lungs at bedtime. CPAP    . ibuprofen (ADVIL,MOTRIN) 200 MG tablet Take 800 mg by mouth every 6 (six) hours as needed (pain). Reported on 01/05/2016    . ondansetron (ZOFRAN-ODT) 8 MG disintegrating tablet Take 1 tablet (8 mg total) by mouth every 8 (eight) hours as needed for nausea or vomiting. 10 tablet 0    Objective: BP 129/75   Pulse (!) 50   Temp 97.9 F  (36.6 C) (Oral)   Resp (!) 22   Ht '6\' 2"'  (1.88 m)   Wt 297 lb (134.7 kg)   SpO2 96%   BMI 38.13 kg/m  Exam: General: pleasant man laying in bed in NAD Eyes: PERRL, EOMI ENTM: moist mucous membranes, no erythema or exudate in oropharynx Neck: supple, nontender, no LAD Cardiovascular: bradycardic. Regular rhythm. No MRG Respiratory: CTAB. No increased WOB Gastrointestinal: soft, NTND. +BS MSK: no edema or cyanosis Derm: no rashes or lesions noted Neuro: CN2-12 in tact, strength 5/5 in upper and lower extremities bilaterally Psych: appropriate mood and affect  Labs and Imaging: CBC BMET   Recent Labs Lab 01/22/17 1057  WBC 7.9  HGB 13.8  HCT 41.6  PLT 251    Recent Labs Lab 01/22/17 1057  NA 138  K 4.1  CL 105  CO2 26  BUN 13  CREATININE 1.04  GLUCOSE 100*  CALCIUM 9.5     Dg Chest 2 View  Result Date: 01/22/2017 CLINICAL DATA:  Chest pain. EXAM: CHEST  2 VIEW COMPARISON:  12/24/2016 . FINDINGS: Mediastinum and hilar structures normal. Low lung volumes with mild basilar atelectasis. No pleural effusion or pneumothorax. No acute bony abnormality. IMPRESSION: Low lung volumes with mild basilar atelectasis. Electronically Signed   By: Marcello Moores  Register   On: 01/22/2017 11:37    Steve Rattler, DO 01/22/2017, 4:33 PM PGY-1, [place] Intern pager: [phone], text pages welcome  UPPER LEVEL ADDENDUM  I have read the above note and made revisions highlighted in orange.  Adin Hector, MD, MPH PGY-2 [facility] Pager [phone]

## 2017-01-23 ENCOUNTER — Observation Stay (HOSPITAL_COMMUNITY): Payer: 59

## 2017-01-23 ENCOUNTER — Observation Stay (HOSPITAL_BASED_OUTPATIENT_CLINIC_OR_DEPARTMENT_OTHER): Payer: 59

## 2017-01-23 ENCOUNTER — Encounter (HOSPITAL_COMMUNITY): Payer: 59

## 2017-01-23 DIAGNOSIS — R42 Dizziness and giddiness: Secondary | ICD-10-CM

## 2017-01-23 DIAGNOSIS — I200 Unstable angina: Secondary | ICD-10-CM | POA: Diagnosis not present

## 2017-01-23 DIAGNOSIS — R253 Fasciculation: Secondary | ICD-10-CM

## 2017-01-23 DIAGNOSIS — R072 Precordial pain: Secondary | ICD-10-CM

## 2017-01-23 DIAGNOSIS — R079 Chest pain, unspecified: Secondary | ICD-10-CM

## 2017-01-23 DIAGNOSIS — R001 Bradycardia, unspecified: Secondary | ICD-10-CM

## 2017-01-23 DIAGNOSIS — R200 Anesthesia of skin: Secondary | ICD-10-CM | POA: Diagnosis not present

## 2017-01-23 DIAGNOSIS — R55 Syncope and collapse: Secondary | ICD-10-CM | POA: Diagnosis not present

## 2017-01-23 DIAGNOSIS — R9439 Abnormal result of other cardiovascular function study: Secondary | ICD-10-CM | POA: Diagnosis not present

## 2017-01-23 DIAGNOSIS — I480 Paroxysmal atrial fibrillation: Secondary | ICD-10-CM | POA: Diagnosis not present

## 2017-01-23 DIAGNOSIS — R0789 Other chest pain: Secondary | ICD-10-CM | POA: Diagnosis not present

## 2017-01-23 DIAGNOSIS — R0602 Shortness of breath: Secondary | ICD-10-CM | POA: Diagnosis not present

## 2017-01-23 LAB — ECHOCARDIOGRAM COMPLETE
Height: 74 in
Weight: 4801.6 oz

## 2017-01-23 LAB — BASIC METABOLIC PANEL
Anion gap: 7 (ref 5–15)
BUN: 12 mg/dL (ref 6–20)
CHLORIDE: 104 mmol/L (ref 101–111)
CO2: 30 mmol/L (ref 22–32)
CREATININE: 0.96 mg/dL (ref 0.61–1.24)
Calcium: 9.3 mg/dL (ref 8.9–10.3)
GFR calc non Af Amer: >60 mL/min (ref >60)
GLUCOSE: 85 mg/dL (ref 65–99)
Potassium: 3.8 mmol/L (ref 3.5–5.1)
Sodium: 141 mmol/L (ref 135–145)

## 2017-01-23 LAB — CBC
HEMATOCRIT: 33.7 % — AB (ref 39.0–52.0)
HEMOGLOBIN: 10.8 g/dL — AB (ref 13.0–17.0)
MCH: 26.6 pg (ref 26.0–34.0)
MCHC: 32 g/dL (ref 30.0–36.0)
MCV: 83 fL (ref 78.0–100.0)
Platelets: 144 10*3/uL — ABNORMAL LOW (ref 150–400)
RBC: 4.06 MIL/uL — ABNORMAL LOW (ref 4.22–5.81)
RDW: 13.4 % (ref 11.5–15.5)
WBC: 4.5 10*3/uL (ref 4.0–10.5)

## 2017-01-23 LAB — VAS US CAROTID
LCCAPSYS: 128 cm/s
LEFT ECA DIAS: -24 cm/s
LEFT VERTEBRAL DIAS: 11 cm/s
LICAPDIAS: -28 cm/s
Left CCA dist dias: -27 cm/s
Left CCA dist sys: -85 cm/s
Left CCA prox dias: 26 cm/s
Left ICA dist dias: -23 cm/s
Left ICA dist sys: -62 cm/s
Left ICA prox sys: -64 cm/s
RCCADSYS: -63 cm/s
RCCAPDIAS: 14 cm/s
RIGHT ECA DIAS: -17 cm/s
RIGHT VERTEBRAL DIAS: 19 cm/s
Right CCA prox sys: 91 cm/s

## 2017-01-23 LAB — TROPONIN I: Troponin I: <0.03 ng/mL (ref <0.03)

## 2017-01-23 LAB — SEDIMENTATION RATE: Sed Rate: 1 mm/hr (ref 0–16)

## 2017-01-23 LAB — LIPID PANEL
Cholesterol: 179 mg/dL (ref 0–200)
HDL: 25 mg/dL — AB (ref >40)
LDL CALC: 108 mg/dL — AB (ref 0–99)
TRIGLYCERIDES: 230 mg/dL — AB (ref <150)
Total CHOL/HDL Ratio: 7.2 RATIO
VLDL: 46 mg/dL — AB (ref 0–40)

## 2017-01-23 LAB — C-REACTIVE PROTEIN

## 2017-01-23 LAB — TSH: TSH: 1.233 u[IU]/mL (ref 0.350–4.500)

## 2017-01-23 MED ORDER — TECHNETIUM TC 99M TETROFOSMIN IV KIT
30.0000 | PACK | Freq: Once | INTRAVENOUS | Status: AC | PRN
  Administered 2017-01-23: 30 via INTRAVENOUS

## 2017-01-23 MED ORDER — ASPIRIN EC 81 MG PO TBEC
81.0000 mg | DELAYED_RELEASE_TABLET | Freq: Every day | ORAL | Status: DC
Start: 2017-01-23 — End: 2017-01-28
  Administered 2017-01-23 – 2017-01-28 (×6): 81 mg via ORAL
  Filled 2017-01-23 (×6): qty 1

## 2017-01-23 MED ORDER — ATORVASTATIN CALCIUM 40 MG PO TABS: 40.0000 mg | ORAL_TABLET | Freq: Every day | ORAL | Status: DC

## 2017-01-23 MED ORDER — REGADENOSON 0.4 MG/5ML IV SOLN
INTRAVENOUS | Status: AC
  Filled 2017-01-23: qty 5

## 2017-01-23 MED ORDER — POTASSIUM CHLORIDE CRYS ER 20 MEQ PO TBCR
20.0000 meq | EXTENDED_RELEASE_TABLET | Freq: Once | ORAL | Status: AC
  Administered 2017-01-23: 20 meq via ORAL

## 2017-01-23 MED ORDER — ATORVASTATIN CALCIUM 20 MG PO TABS
20.0000 mg | ORAL_TABLET | Freq: Every day | ORAL | Status: DC
  Administered 2017-01-23 – 2017-01-26 (×4): 20 mg via ORAL
  Filled 2017-01-23 (×5): qty 1

## 2017-01-23 NOTE — Plan of Care (Signed)
Problem: Physical Regulation: Goal: Ability to maintain clinical measurements within normal limits will improve Outcome: Progressing Pt denies any CP. VSS. NSR/SB throughout shift.

## 2017-01-23 NOTE — Progress Notes (Signed)
    Patient presented for exercise nuclear stress test. Tolerated procedure well. This is a 2 day test. Pending final stress imaging result.  Daune Perch, AGNP-C 01/23/2017  11:28 AM Pager: [phone]

## 2017-01-23 NOTE — Progress Notes (Signed)
  Echocardiogram 2D Echocardiogram has been performed.  [name] 01/23/2017, 4:33 PM

## 2017-01-23 NOTE — Consult Note (Signed)
Cardiology Consultation:   Patient ID: [name]; [id number]; 1965-11-17   Admit date: 01/22/2017 Date of Consult: 01/23/2017  Primary Care Provider: [name], MD Primary Cardiologist: Dr. Acie Fredrickson,  Electrophysiologist:  Dr. Aggie Cosier NP for afib clinic  Patient Profile:   [name] is a 51 y.o. male with a hx of PAF on Tikosyn,  who is being seen today for the evaluation of chest pain at the request of [place].  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 He called the office and was directed to the hospital. He has been having sharp left-sided chest pain at least since May and was arranged for a stress test to be done on July 2. He has had similar such pain in 2016 at which time he had a negative stress test. This pain is worse with a deep breath and he has point tenderness in the left rib area upon palpation. The patient is concerned about his low heart rate and wonders if this may be a cause of his chest pain and frequent lightheadedness. He does note that his lightheadedness has improved since his weight loss of 43 pounds over the last 15 weeks using Weight Watchers. The patient had previously had central chest discomfort that has greatly improved on omeprazole. This discomfort is different.  The patient was recently seen in the A. fib clinic by Roderic Palau, NP for follow-up of Tikosyn therapy where it was noted that he was feeling well and had recently lost weight from 344 pounds to 316 pounds with weight watcher's and is feeling better. The patient was maintaining sinus rhythm on dofetilide with a stable QTC and he continues Eliquis for stroke risk reduction. Labs were normal. He apparently had been having some bradycardia with heart rate in the 40s however the office it was in the 60s and  bradycardia was thought to possibly be related to pain medications as the patient had no rate control medicines on board. No Tikosyn adjustment was made.  Roderic Palau had arranged for a treadmill stress test to evaluate his chest pain as well as chronotropic competence there was to be done on 7/2.  Troponins negative 3 Kidney function and electrolytes normal with potassium 4.1 EKG showed sinus bradycardia, 59 bpm, QTC 419, no ischemic changes. Follow-up EKG is unchanged Chest x-ray shows low lung volumes with mild basilar atelectasis  The patient had a low risk Lexiscan Myoview on 03/04/2015 Echocardiogram on 09/13/2015 showed normal LV systolic function with EF 65-70%, mildly dilated left atrium, moderately dilated right atrium  Past Medical History:  Diagnosis Date  . Arthritis    "knees" (01/22/2017)  . Chest pain 09/12/10   Myoview-attenuation defiect in the inferior myocardium, no ischemia, low risk scan  . Chronic lower back pain   . GERD (gastroesophageal reflux disease)   . Gout    "no flares in awhile" (01/22/2017)  . History of MRI of cervical spine 09/25/10   normal; left shoulder normal as well  . Hypertension   . Migraine    "~ q 6 months" (01/22/2017)  . Morbid obesity ([place])    "I've lost 43# in 15 weeks w/weight watchers" (01/22/2017)  . OSA on CPAP   . Paroxysmal atrial fibrillation ([place]) 7/15   chads2vasc score is 1    Past Surgical History:  Procedure Laterality Date  .  CARDIAC CATHETERIZATION  05/2005   "no blockage"  . KNEE ARTHROSCOPY Right 02/2013     Inpatient Medications: Scheduled Meds: . apixaban  5 mg Oral BID  . dofetilide  500 mcg Oral BID   Continuous Infusions:  PRN Meds: acetaminophen, gi cocktail, ondansetron (ZOFRAN) IV  Allergies:    Allergies  Allergen Reactions  . Hydrochlorothiazide-Propranolol [Propranolol-Hctz] Other (See Comments)    Joints lock up  . Penicillins Other (See Comments)    Hives Makes joints stiff and unable to  move Has patient had a PCN reaction causing immediate rash, facial/tongue/throat swelling, SOB or lightheadedness with hypotension: No Has patient had a PCN reaction causing severe rash involving mucus membranes or skin necrosis: No Has patient had a PCN reaction that required hospitalization No Has patient had a PCN reaction occurring within the last 10 years: No If all of the above answers are "NO", then may proceed with Cephalosporin use.    Social History:   Social History   Social History  . Marital status: Married    Spouse name: N/A  . Number of children: N/A  . Years of education: N/A   Occupational History  . Not on file.   Social History Main Topics  . Smoking status: Former Smoker    Packs/day: 1.00    Years: 10.00    Types: Cigarettes    Quit date: 04/02/1994  . Smokeless tobacco: Former Systems developer    Types: Chew     Comment: "chewed in my teens"  . Alcohol use No     Comment: "stopped in 1994"  . Drug use: No     Comment: "stopped marijuna in 1994"  . Sexual activity: Yes   Other Topics Concern  . Not on file   Social History Narrative   Lives in [place] with spouse.  3 children, all grown.   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  ROS:  Please see the history of present illness.  ROS  All other ROS reviewed and negative.     Physical Exam/Data:   Vitals:   01/22/17 1633 01/22/17 2300 01/22/17 2344 01/23/17 0300  BP: 124/83 (!) 146/75  120/75  Pulse: (!) 51 (!) 47 (!) 55 68  Resp: 18 18 18 19   Temp: 97.5 F (36.4 C)     TempSrc: Oral     SpO2: 100% 94% 97% 95%  Weight: 298 lb 3.2 oz (135.3 kg)   (!) 300 lb 1.6 oz (136.1 kg)  Height: 6\' 2"  (1.88 m)       Intake/Output Summary (Last 24 hours) at 01/23/17 0739 Last data filed at 01/22/17 1900  Gross per 24 hour  Intake              120 ml  Output                 0 ml  Net              120 ml   Filed Weights   01/22/17 1101 01/22/17 1633 01/23/17 0300  Weight: 297 lb (134.7 kg) 298 lb 3.2 oz (135.3 kg) (!) 300 lb 1.6 oz (136.1 kg)   Body mass index is 38.53 kg/m.  General:  Well nourished, well developed, in no acute distress HEENT: normal Lymph: no adenopathy Neck: no JVD Endocrine:  No thryomegaly Vascular:  No carotid bruits; FA pulses 2+ bilaterally without bruits  Cardiac:  normal S1, S2; RRR; no murmur  Lungs:  clear to auscultation bilaterally, no wheezing, rhonchi or rales  Abd: soft, nontender, no hepatomegaly  Ext: no edema Musculoskeletal:  No deformities, BUE and BLE strength normal and equal, distinct tenderness of the left chest wall on palpation and deep breathing Skin: warm and dry  Neuro:  CNs 2-12 intact, no focal abnormalities noted Psych:  Normal affect    EKG:  The EKG was personally reviewed and demonstrates  sinus bradycardia, 59 bpm, QTC 419, no ischemic changes. Follow-up EKG is unchanged   Relevant CV Studies:  Echocardiogram 09/13/15 Study Conclusions  - Left ventricle: The cavity size was normal. Wall thickness was   normal. Systolic function was vigorous. The estimated ejection   fraction was in the range of 65% to 70%. Wall motion was normal;   there were no regional wall motion abnormalities. - Left atrium: The atrium was mildly dilated. - Right ventricle: The cavity size was mildly dilated. Wall   thickness was normal. - Right atrium: The atrium was moderately dilated.   Laboratory Data:  Chemistry Recent Labs Lab 01/22/17 1057  NA 138  K 4.1  CL 105  CO2 26  GLUCOSE 100*  BUN 13  CREATININE 1.04  CALCIUM 9.5  GFRNONAA >60  GFRAA >60  ANIONGAP 7    No results for input(s): PROT, ALBUMIN, AST, ALT, ALKPHOS, BILITOT in the last 168 hours. Hematology Recent Labs Lab 01/22/17 1057  WBC 7.9  RBC 5.11  HGB 13.8  HCT 41.6  MCV 81.4  MCH 27.0  MCHC 33.2  RDW 13.2  PLT 251    Cardiac Enzymes Recent Labs Lab 01/22/17 1659 01/22/17 1928 01/22/17 2320  TROPONINI <0.03 <0.03 <0.03    Recent Labs Lab 01/22/17 1116  TROPIPOC 0.00    BNPNo results for input(s): BNP, PROBNP in the last 168 hours.  DDimer No results for input(s): DDIMER in the last 168 hours.  Radiology/Studies:  Dg Chest 2 View  Result Date: 01/22/2017 CLINICAL DATA:  Chest pain. EXAM: CHEST  2 VIEW COMPARISON:  12/24/2016 . FINDINGS: Mediastinum and hilar structures normal. Low lung volumes with mild basilar atelectasis. No pleural effusion or pneumothorax. No acute bony abnormality. IMPRESSION: Low lung volumes with mild basilar atelectasis. 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 The pain is worse with a deep breath and left chest wall is tender to palpation -Troponins negative 3 -Kidney function and electrolytes normal with potassium 4.1 -EKG showed sinus bradycardia, 59 bpm, QTC 419, no ischemic changes. Follow-up EKG is unchanged -Chest x-ray shows low lung volumes with mild basilar atelectasis -Chest discomfort is reproducible with deep breathing and palpation and thus is likely musculoskeletal.  -Patient had been arranged for stress test on 7/2 to evaluate chest pain and to assess chronotropic competence related to bradycardia. Will discuss with Dr. Meda Coffee as to whether we should do this while he is here  Internal medicine is looking into possible TIA in consideration of left arm numbness and neurology was consulted for left temporal spasms. No further workup at this time.   Atrial fibrillation -Patient is maintaining sinus rhythm on dofetilide. Labs are normal QTC 419 -Patient is  on Eliquis for stroke risk reduction  Bradycardia -Heart  rate in the upper 50s per EKG. In review of telemetry heart rate running as low as the mid 40s overnight -Patient complains of fairly frequent lightheadedness, however this is improved with weight loss  Obstructive sleep apnea -Patient reports consistent use of CPAP   GERD -Central chest pain thought to be related to GERD has greatly improved with omeprazole -Continue PPI  Signed, Daune Perch, NP  01/23/2017 7:39 AM  The patient was seen, examined and discussed with Daune Perch, NP-C and I agree with the above.   [name] is a 51 y.o. male with a hx of PAF on Tikosyn,  who is being seen today for the evaluation of chest pain. 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 He called the office and was directed to the hospital. He has been having sharp left-sided chest pain at least since May and was arranged for a stress test to be done on July 2. He has had similar such pain in 2016 at which time he had a negative stress test. His chest pain is reproducible on palpation ,otherwise he is chest pain or SOB free right now. ECG shows SB, telemetry shows SB down to 40' and he states that it makes him feel dizzy. No syncope.  Troponins negative 3. We will arrange for an exercise nuclear stress test to evaluate for chronotropic incompetence and ischemia. Echocardiogram in November 2017 showed LVEF 65-70%, mildly dilated left atrium.  Ena Dawley, MD 01/23/2017

## 2017-01-23 NOTE — Progress Notes (Signed)
*  PRELIMINARY RESULTS* Vascular Ultrasound Carotid Duplex (Doppler) has been completed.  Preliminary findings: Bilateral: No significant (1-39%) ICA stenosis. Antegrade vertebral flow.   Landry Mellow, RDMS, RVT  01/23/2017, 3:18 PM

## 2017-01-23 NOTE — Progress Notes (Signed)
Family Medicine Teaching Service Daily Progress Note Intern Pager: [phone]  Patient name: [name] Medical record number: [id number] Date of birth: [birth year] Age: 51 y.o. Gender: male  Primary Care Provider: [name], MD Consultants: Neurology (signed off) Code Status: full  Pt Overview and Major Events to Date:  6/20 admitted for chest pain  Assessment and Plan: [name] is a 51 y.o. male presenting with chest pain. PMH is significant for afib on eliquis, OSA, chronic back pain, HTN, HLD, migraines, obesity, and GERD.  Chest pain. Heart score  4. Troponins neg x3 overnight. EKG sinus bradycardia, no ST changes or T wave changes. Reproducible on exam this morning. Has outpatient stress test on 02/03/17 - monitor on telemetry - vitals per floor - cardiology consulted, appreciate recommendations - risk stratification labs: LDL 108, TSH 1.233, A1c pending - echo - start ASA  Near-syncope- Additional episode of syncope once before several years ago. He has bradycardic episodes in past where he feels he may syncopize. - carotid dopplers - cardiology c/s as above  Headache with L temporal hemifacial spam, resolved. Started morning of admission. With eye pain and blurred vision, left sided numbness of face, arm and leg. DDx includes temporal arteritis vs symptomatic migraine vs TIA. 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 No further workup. Symptoms resolved, signed off. - Will get MRI brain given the L sided weakness to r/o TIA - ESR pending, CRP neg  Afib- on eliquis and tikosyn, currently in NSR with bradycardia. HR in 50-60s -tele -continue eliquis and tikosyn  -cards c/s as above  OSA -uses CPAP at night, continue in hospital  HLD.  Last lipid panel October 2017. Not on statin at home.  LDL 108. ASCVD 33yrrisk 6.9% would benefit from moderate intensity statin. - start  atorvastatin 254mqd  FEN/GI: heart healthy diet, PPI Prophylaxis: eliquis  Disposition: pending cardiology recommendations  Subjective:  States his L sided chest pain has resolved, only feels with certain positions and palpation of his chest, achy feeling when present but none this morning. No palpitations or SOB  Objective: Temp:  [97.5 F (36.4 C)-97.9 F (36.6 C)] 97.5 F (36.4 C) (06/20 1633) Pulse Rate:  [45-68] 68 (06/21 0300) Resp:  [10-24] 19 (06/21 0300) BP: (120-146)/(71-87) 120/75 (06/21 0300) SpO2:  [94 %-100 %] 95 % (06/21 0300) FiO2 (%):  [21 %] 21 % (06/20 2344) Weight:  [134.7 kg (297 lb)-136.1 kg (300 lb 1.6 oz)] 136.1 kg (300 lb 1.6 oz) (06/21 0300) Physical Exam: General: Sitting up in bed, in NAD Cardiovascular: bradycardic, normal s1 and s2, no murmurs Respiratory: CTAB, normal effort on room air Abdomen: soft, nontender, nondistended, + bowel sounds Extremities: no LE edema  Laboratory:  Recent Labs Lab 01/22/17 1057  WBC 7.9  HGB 13.8  HCT 41.6  PLT 251    Recent Labs Lab 01/22/17 1057  NA 138  K 4.1  CL 105  CO2 26  BUN 13  CREATININE 1.04  CALCIUM 9.5  GLUCOSE 100*   Lipid Panel     Component Value Date/Time   CHOL 179 01/22/2017 2320   TRIG 230 (H) 01/22/2017 2320   HDL 25 (L) 01/22/2017 2320   CHOLHDL 7.2 01/22/2017 2320   VLDL 46 (H) 01/22/2017 2320   LDLCALC 108 (H) 01/22/2017 2320     Imaging/Diagnostic Tests: Dg Chest 2 View  Result Date: 01/22/2017 CLINICAL DATA:  Chest pain.  EXAM: CHEST  2 VIEW COMPARISON:  12/24/2016 . FINDINGS: Mediastinum and hilar structures normal. Low lung volumes with mild basilar atelectasis. No pleural effusion or pneumothorax. No acute bony abnormality. IMPRESSION: Low lung volumes with mild basilar atelectasis. Electronically Signed   By: Marcello Moores  Register   On: 01/22/2017 11:37    Bufford Lope, DO 01/23/2017, 6:55 AM PGY-1, [facility] Intern pager: [phone], text  pages welcome

## 2017-01-23 NOTE — Discharge Summary (Signed)
[facility] Discharge Summary  Patient name: [name] Medical record number: [id number] Date of birth: [birth year] Age: 51 y.o. Gender: male Date of Admission: 01/22/2017  Date of Discharge: 01/28/17 Admitting Physician: Kinnie Feil, MD  Primary Care Provider: [name], MD Consultants: Cardiology  Indication for Hospitalization: chest pain  Discharge Diagnoses/Problem List:  Patient Active Problem List   Diagnosis Date Noted  . Abnormal stress test   . Symptomatic sinus bradycardia   . Left sided numbness   . Muscle fasciculation   . Primary osteoarthritis of right knee 07/23/2016  . Plantar fasciitis of left foot 05/07/2016  . 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 Acute gout   . Dizziness 03/21/2015  . Shortness of breath 03/21/2015  . OSA on CPAP   . PAF (paroxysmal atrial fibrillation) ([place])   . Palpitations   . Chest pain 03/03/2015  . Left knee pain 10/13/2014  . Atrial fibrillation ([place]) 03/14/2014  . Right knee pain 11/10/2012  . Sinusitis 10/23/2012  . Left hip pain 06/17/2011  . OSA (obstructive sleep apnea) 05/17/2011  . Chronic back pain 10/29/2010  . INSOMNIA 09/19/2008  . Gout 05/13/2007  . Hyperlipidemia 10/02/2006  . Morbid obesity ([place]) 10/02/2006  . Migraine 10/02/2006  . HYPERTENSION, BENIGN SYSTEMIC 10/02/2006  . GASTROESOPHAGEAL REFLUX, NO ESOPHAGITIS 10/02/2006     Disposition: Home   Discharge Condition: Stable, improved   Discharge Exam:  General: Sitting up in chair, in NAD Cardiovascular: RRR, no murmurs Respiratory: CTAB, normal effort on room air Abdomen: soft, nontender, nondistended, + bowel sounds Extremities: no LE edema  Brief Hospital Course:  [name] a 51 y.o.malewith PMH significant for afib on eliquis, OSA, chronic back pain, HTN, HLD, migraines, obesity, and GERD who presented with  chest pain. He had L sided chest pain radiating to L arm that was reproducible with palpations, given his risk factors was observed. His troponins were negative x 3 and his chest pain resolved. Cardiology was consulted and opted for nuclear exercise stress test which showed high risk. Patient underwent cardiac catherization which showed normal coronary arteries   Of note, patient also had L temporal hemifacial spasm with associated eye pain and blurred vision concerning for temporal arteritis vs symptomatic migraine vs TIA as patient also reports left sided numbness of face, arm and leg. His symptoms resolved rapidly after admission. MRI brain was normal. ESR and CRP were negative. Neurology was consulted and felt was symptomatic migraine. Recommended no further work up. Had brief resumption of L retroorbital HA that resolved with toradol and reglan.  Issues for Follow Up:  1. Patient to follow up with cardiology as outpatient 2. Started on atorvastatin 12m qd this hospital stay since ASCVD 166yrisk 6.9% would benefit from moderate intensity statin.  Significant Procedures: cardiac cath  Significant Labs and Imaging:   Recent Labs Lab 01/24/17 0315 01/25/17 0245 01/28/17 0426  WBC 6.8 8.3 7.5  HGB 13.2 13.0 12.8*  HCT 41.5 40.4 40.0  PLT 224 206 197    Recent Labs Lab 01/24/17 0315 01/25/17 0245 01/26/17 0255 01/27/17 0407 01/28/17 0426  NA 141 141 141 141 140  K 3.8 3.8 4.4 3.6 4.3  CL 107 106 107 107 106  CO2 _0 GLUCOSE 109* 99 94 114* 112*  BUN _1 CREATININE 0.86 0.93 0.94 0.91 0.93  CALCIUM 9.2  9.1 9.2 9.1 9.1  MG 2.2  --   --   --   --      Left Heart Cath and Coronary Angiography  Conclusion     The left ventricular ejection fraction is 55-65% by visual estimate.  LV end diastolic pressure is normal.  The left ventricular systolic function is normal.   Normal LV function without focal segmental wall motion abnormalities with an  ejection fraction estimated at 60%.  Normal coronary arteries.  RECOMMENDATION: Medical therapy for the patient's PAF with resumption of anticoagulation therapy.  False positive nuclear perfusion study most likely due to the patient's body habitus and marked diaphragmatic attenuation.     Results/Tests Pending at Time of Discharge: none  Discharge Medications:  Allergies as of 01/28/2017      Reactions   Hydrochlorothiazide-propranolol [propranolol-hctz] Other (See Comments)   Joints lock up   Penicillins Other (See Comments)   Hives Makes joints stiff and unable to move Has patient had a PCN reaction causing immediate rash, facial/tongue/throat swelling, SOB or lightheadedness with hypotension: No Has patient had a PCN reaction causing severe rash involving mucus membranes or skin necrosis: No Has patient had a PCN reaction that required hospitalization No Has patient had a PCN reaction occurring within the last 10 years: No If all of the above answers are "NO", then may proceed with Cephalosporin use.      Medication List    TAKE these medications   aspirin 81 MG EC tablet Take 1 tablet (81 mg total) by mouth daily. Start taking on:  01/29/2017   aspirin-acetaminophen-caffeine 250-250-65 MG tablet Commonly known as:  EXCEDRIN MIGRAINE Take 3 tablets by mouth every 6 (six) hours as needed for headache.   atorvastatin 20 MG tablet Commonly known as:  LIPITOR Take 1 tablet (20 mg total) by mouth daily.   [employer] GENTLE LUBRICANT EYE DROPS OP Place 1 drop into both eyes daily as needed (dry eyes).   dofetilide 500 MCG capsule Commonly known as:  TIKOSYN Take 1 capsule (500 mcg total) by mouth 2 (two) times daily.   ELIQUIS 5 MG Tabs tablet Generic drug:  apixaban TAKE ONE TABLET BY MOUTH TWICE DAILY   Fish Oil 1000 MG Caps Take 2,000 mg by mouth daily.   fluticasone 50 MCG/ACT nasal spray Commonly known as:  FLONASE Place 1 spray into both nostrils daily.    ibuprofen 200 MG tablet Commonly known as:  ADVIL,MOTRIN Take 800 mg by mouth every 6 (six) hours as needed (pain). Reported on 01/05/2016   loratadine 10 MG tablet Commonly known as:  CLARITIN Take 1 tablet (10 mg total) by mouth daily. Start taking on:  01/29/2017   omeprazole 20 MG capsule Commonly known as:  PRILOSEC TAKE ONE CAPSULE BY MOUTH TWICE DAILY What changed:  See the new instructions.   ondansetron 8 MG disintegrating tablet Commonly known as:  ZOFRAN-ODT Take 1 tablet (8 mg total) by mouth every 8 (eight) hours as needed for nausea or vomiting.   PRESCRIPTION MEDICATION Inhale into the lungs at bedtime. CPAP   TURMERIC PO Take 1 tablet by mouth every Monday, Wednesday, and Friday.       Discharge Instructions: Please refer to Patient Instructions section of EMR for full details.  Patient was counseled important signs and symptoms that should prompt return to medical care, changes in medications, dietary instructions, activity restrictions, and follow up appointments.   Follow-Up Appointments: Follow-up Information    Steve Rattler, DO. Go on  02/04/2017.   Why:  Hospital follow up apppointment, please arrive at 3:45pm for check in Contact information: [place] [address] [phone]        Leanor Kail, [place] Follow up on 02/18/2017.   Specialty:  Cardiology Why:  8:30 AM for hopsital follow up Contact information: [address] [place]  [address] [phone]           Bufford Lope, DO 01/28/2017, 8:56 AM PGY-1, [place]

## 2017-01-23 NOTE — Procedures (Signed)
CPAP bedside and ready for use.  Patient places on himself when ready for bed.

## 2017-01-24 ENCOUNTER — Encounter (HOSPITAL_COMMUNITY): Payer: Self-pay | Admitting: Physician Assistant

## 2017-01-24 DIAGNOSIS — G43809 Other migraine, not intractable, without status migrainosus: Secondary | ICD-10-CM | POA: Diagnosis not present

## 2017-01-24 DIAGNOSIS — R001 Bradycardia, unspecified: Secondary | ICD-10-CM

## 2017-01-24 DIAGNOSIS — R51 Headache: Secondary | ICD-10-CM

## 2017-01-24 DIAGNOSIS — R079 Chest pain, unspecified: Secondary | ICD-10-CM | POA: Diagnosis not present

## 2017-01-24 DIAGNOSIS — I200 Unstable angina: Secondary | ICD-10-CM | POA: Diagnosis not present

## 2017-01-24 DIAGNOSIS — R9439 Abnormal result of other cardiovascular function study: Secondary | ICD-10-CM | POA: Diagnosis not present

## 2017-01-24 DIAGNOSIS — R253 Fasciculation: Secondary | ICD-10-CM | POA: Diagnosis not present

## 2017-01-24 DIAGNOSIS — I480 Paroxysmal atrial fibrillation: Secondary | ICD-10-CM | POA: Diagnosis not present

## 2017-01-24 LAB — CBC
HCT: 41.5 % (ref 39.0–52.0)
Hemoglobin: 13.2 g/dL (ref 13.0–17.0)
MCH: 26.7 pg (ref 26.0–34.0)
MCHC: 31.8 g/dL (ref 30.0–36.0)
MCV: 83.8 fL (ref 78.0–100.0)
PLATELETS: 224 10*3/uL (ref 150–400)
RBC: 4.95 MIL/uL (ref 4.22–5.81)
RDW: 13.6 % (ref 11.5–15.5)
WBC: 6.8 10*3/uL (ref 4.0–10.5)

## 2017-01-24 LAB — NM MYOCAR MULTI W/SPECT W/WALL MOTION / EF
Estimated workload: 8.7 METS
Exercise duration (min): 7 min
Exercise duration (sec): 6 s
MPHR: 168 {beats}/min
Peak HR: 146 {beats}/min
Percent HR: 86 %
Rest HR: 49 {beats}/min

## 2017-01-24 LAB — BASIC METABOLIC PANEL
Anion gap: 7 (ref 5–15)
BUN: 10 mg/dL (ref 6–20)
CALCIUM: 9.2 mg/dL (ref 8.9–10.3)
CO2: 27 mmol/L (ref 22–32)
Chloride: 107 mmol/L (ref 101–111)
Creatinine, Ser: 0.86 mg/dL (ref 0.61–1.24)
GFR calc non Af Amer: >60 mL/min (ref >60)
Glucose, Bld: 109 mg/dL — ABNORMAL HIGH (ref 65–99)
Potassium: 3.8 mmol/L (ref 3.5–5.1)
SODIUM: 141 mmol/L (ref 135–145)

## 2017-01-24 LAB — MAGNESIUM: MAGNESIUM: 2.2 mg/dL (ref 1.7–2.4)

## 2017-01-24 LAB — HEMOGLOBIN A1C
Hgb A1c MFr Bld: 5.6 % (ref 4.8–5.6)
MEAN PLASMA GLUCOSE: 114 mg/dL

## 2017-01-24 MED ORDER — TECHNETIUM TC 99M TETROFOSMIN IV KIT
30.0000 | PACK | Freq: Once | INTRAVENOUS | Status: AC | PRN
  Administered 2017-01-24: 30 via INTRAVENOUS

## 2017-01-24 MED ORDER — METOCLOPRAMIDE HCL 5 MG/ML IJ SOLN
10.0000 mg | Freq: Once | INTRAMUSCULAR | Status: AC
  Administered 2017-01-24: 10 mg via INTRAVENOUS
  Filled 2017-01-24: qty 2

## 2017-01-24 MED ORDER — POTASSIUM CHLORIDE CRYS ER 20 MEQ PO TBCR
20.0000 meq | EXTENDED_RELEASE_TABLET | Freq: Once | ORAL | Status: AC
  Administered 2017-01-24: 20 meq via ORAL
  Filled 2017-01-24: qty 1

## 2017-01-24 MED ORDER — KETOROLAC TROMETHAMINE 30 MG/ML IJ SOLN
30.0000 mg | Freq: Once | INTRAMUSCULAR | Status: AC
  Administered 2017-01-24: 30 mg via INTRAVENOUS
  Filled 2017-01-24: qty 1

## 2017-01-24 NOTE — Procedures (Signed)
CPAP is bedside and ready for use.  Patient said he will place on when ready for bed.

## 2017-01-24 NOTE — Progress Notes (Signed)
Patient with left retro-orbital throbbing pain associated with photophobia.   No focal weakness, VFF, pupils equal.   He has a history of migraine and states that this feels like migraine.   I will order toradol and reglan. Please call with any further quesitons or concerns.   Roland Rack, MD [facility] [phone]  If 7pm- 7am, please page neurology on call as listed in [place].

## 2017-01-24 NOTE — Progress Notes (Signed)
Family Medicine Teaching Service Daily Progress Note Intern Pager: [phone]  Patient name: [name] Medical record number: [id number] Date of birth: May 03, 1966 Age: 51 y.o. Gender: male  Primary Care Provider: [name], MD Consultants: Neurology (signed off), Cardiology Code Status: full  Pt Overview and Major Events to Date:  6/20 admitted for chest pain  Assessment and Plan: [name] is a 50 y.o. male presenting with chest pain. PMH is significant for afib on eliquis, OSA, chronic back pain, HTN, HLD, migraines, obesity, and GERD.  Chest pain. Heart score 4. Troponins neg x3 overnight. EKG sinus bradycardia, no ST changes or T wave changes.  - monitor on telemetry - vitals per floor - cardiology following, exercise nuclear stress test high risk, patient will need cath. - risk stratification labs: LDL 108, TSH 1.233, A1c 5.6 - echo EF 60-65% G2DD no regional wall motion abnormalities - continue ASA 81  Headache with L temporal hemifacial spam. Resumption of L temporal/retroorbital throbbing pain but no fasciculations on exam. Likely due to symptomatic migraine. ESR and CRP neg so unlikely temporal arteritis. TIA/CVA ruled out with normal MRI brain. 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 No further workup.  - Toradol and reglan per neuro  Near-syncope, resolved- Additional episode of syncope once before several years ago. He has bradycardic episodes in past where he feels he may syncopize. - carotid dopplers patent with antegrade flow   Afib- on eliquis and tikosyn, currently in NSR with bradycardia. HR in 50-60s -tele -continue eliquis and tikosyn  -monitor K, will keep >4  OSA -uses CPAP at night, continue in hospital  HLD.  Last lipid panel October 2017. Not on statin at home.  LDL 108. ASCVD 48yrrisk 6.9% would benefit from moderate intensity statin. - atorvastatin 224m qd  FEN/GI: NPO for test, PPI Prophylaxis: eliquis  Disposition: pending cardiac cath  Subjective:  Has L sided HA that feels like a throbbing pain today, has light sensitivity. Feels like migraines he has had in the past. No weakness today. Still having occasional chest pain that is positional. No palpitations or SOB.   Objective: Temp:  [97.7 F (36.5 C)-98 F (36.7 C)] 97.9 F (36.6 C) (06/22 0527) Pulse Rate:  [57-67] 67 (06/22 0527) Resp:  [18-20] 18 (06/22 0527) BP: (101-213)/(54-99) 121/62 (06/22 0527) SpO2:  [97 %-100 %] 97 % (06/22 0527) Weight:  [305 lb (138.3 kg)] 305 lb (138.3 kg) (06/22 0527) Physical Exam: General: Sitting up in bed, in NAD Cardiovascular: bradycardic, normal s1 and s2, no murmurs Respiratory: CTAB, normal effort on room air Abdomen: soft, nontender, nondistended, + bowel sounds Extremities: no LE edema  Laboratory:  Recent Labs Lab 01/22/17 1057 01/23/17 0444 01/24/17 0315  WBC 7.9 4.5 6.8  HGB 13.8 10.8* 13.2  HCT 41.6 33.7* 41.5  PLT 251 144* 224    Recent Labs Lab 01/22/17 1057 01/23/17 0444 01/24/17 0315  NA 138 141 141  K 4.1 3.8 3.8  CL 105 104 107  CO2 '26 30 27  ' BUN '13 12 10  ' CREATININE 1.04 0.96 0.86  CALCIUM 9.5 9.3 9.2  GLUCOSE 100* 85 109*   Lipid Panel     Component Value Date/Time   CHOL 179 01/22/2017 2320   TRIG 230 (H) 01/22/2017 2320   HDL 25 (L) 01/22/2017 2320   CHOLHDL 7.2 01/22/2017 2320   VLDL 46 (H) 01/22/2017 2320   LDLCALC 108 (H)  01/22/2017 2320     Imaging/Diagnostic Tests: Mr Brain Wo Contrast  Result Date: 01/23/2017 CLINICAL DATA:  Initial evaluation for acute left-sided numbness. EXAM: MRI HEAD WITHOUT CONTRAST TECHNIQUE: Multiplanar, multiecho pulse sequences of the brain and surrounding structures were obtained without intravenous contrast. COMPARISON:  Prior CT from 04/03/2016. FINDINGS: Brain: Cerebral volume within normal limits for age. Few tiny subcentimeter foci of T2/FLAIR  hyperintense foci noted within the periventricular and deep white matter both cerebral hemispheres, nonspecific, but felt to be within normal limits for age. No abnormal foci of restricted diffusion to suggest acute or subacute ischemia. Gray-white matter differentiation well maintained. No evidence for chronic infarction. No evidence for acute or chronic intracranial hemorrhage. No mass lesion, midline shift or mass effect. No hydrocephalus. No extra-axial fluid collection. Major dural sinuses are grossly patent. Pituitary gland suprasellar region within normal limits. Midline structures intact and normal. Vascular: Major intracranial vascular flow voids are maintained. Skull and upper cervical spine: Craniocervical junction normal. Visualized upper cervical spine unremarkable. Bone marrow signal intensity within normal limits. No scalp soft tissue abnormality. Sinuses/Orbits: Globes and orbital soft tissues within normal limits. Extensive mucosal thickening throughout the paranasal sinuses, likely allergic/ inflammatory nature. No mastoid effusion. Inner ear structures normal. IMPRESSION: 1. Normal brain MRI.  No acute intracranial process identified. 2. Extensive pan sinus disease. Electronically Signed   By: Jeannine Boga M.D.   On: 01/23/2017 22:14    Wayne Lope, DO 01/24/2017, 9:11 AM PGY-1, [place] Intern pager: [phone], text pages welcome

## 2017-01-24 NOTE — Progress Notes (Signed)
Patient Name: [name] Date of Encounter: 01/24/2017  Primary Cardiologist: Dr. Acie Fredrickson,  Electrophysiologist:  Dr. Aggie Cosier NP for [facility] Problem List     Active Problems:   Chest pain   Left sided numbness   Muscle fasciculation     Subjective   Had some sharp chest pains this AM when he got up.   Inpatient Medications    Scheduled Meds: . apixaban  5 mg Oral BID  . aspirin EC  81 mg Oral Daily  . atorvastatin  20 mg Oral q1800  . dofetilide  500 mcg Oral BID   Continuous Infusions:  PRN Meds: acetaminophen, gi cocktail, ondansetron (ZOFRAN) IV   Vital Signs    Vitals:   01/23/17 1121 01/23/17 1433 01/23/17 2027 01/24/17 0527  BP: (!) 139/92 115/63 (!) 101/54 121/62  Pulse:  62 (!) 57 67  Resp:  18 20 18   Temp:  97.7 F (36.5 C) 98 F (36.7 C) 97.9 F (36.6 C)  TempSrc:  Oral Axillary Axillary  SpO2:  97% 100% 97%  Weight:    (!) 305 lb (138.3 kg)  Height:    6\' 2"  (1.88 m)    Intake/Output Summary (Last 24 hours) at 01/24/17 1045 Last data filed at 01/23/17 2300  Gross per 24 hour  Intake              600 ml  Output              525 ml  Net               75 ml   Filed Weights   01/22/17 1633 01/23/17 0300 01/24/17 0527  Weight: 298 lb 3.2 oz (135.3 kg) (!) 300 lb 1.6 oz (136.1 kg) (!) 305 lb (138.3 kg)    Physical Exam   GEN: Well nourished, well developed, in no acute distress. obese HEENT: Grossly normal.  Neck: Supple, no JVD, carotid bruits, or masses. Cardiac: RRR, no murmurs, rubs, or gallops. No clubbing, cyanosis, edema.  Radials/DP/PT 2+ and equal bilaterally.  Respiratory:  Respirations regular and unlabored, clear to auscultation bilaterally. GI: Soft, nontender, nondistended, BS + x 4. MS: no deformity or atrophy. Skin: warm and dry, no rash. Neuro:  Strength and sensation are intact. Psych: AAOx3.  Normal affect.  Labs    CBC  Recent Labs  01/22/17 1057 01/23/17 0444 01/24/17 0315  WBC  7.9 4.5 6.8  NEUTROABS 4.3  --   --   HGB 13.8 10.8* 13.2  HCT 41.6 33.7* 41.5  MCV 81.4 83.0 83.8  PLT 251 144* 664   Basic Metabolic Panel  Recent Labs  01/23/17 0444 01/24/17 0315  NA 141 141  K 3.8 3.8  CL 104 107  CO2 30 27  GLUCOSE 85 109*  BUN 12 10  CREATININE 0.96 0.86  CALCIUM 9.3 9.2  MG  --  2.2   Liver Function Tests No results for input(s): AST, ALT, ALKPHOS, BILITOT, PROT, ALBUMIN in the last 72 hours. No results for input(s): LIPASE, AMYLASE in the last 72 hours. Cardiac Enzymes  Recent Labs  01/22/17 1659 01/22/17 1928 01/22/17 2320  TROPONINI <0.03 <0.03 <0.03   BNP Invalid input(s): POCBNP D-Dimer No results for input(s): DDIMER in the last 72 hours. Hemoglobin A1C  Recent Labs  01/22/17 2320  HGBA1C 5.6   Fasting Lipid Panel  Recent Labs  01/22/17 2320  CHOL 179  HDL 25*  LDLCALC 108*  TRIG 230*  CHOLHDL 7.2   Thyroid Function Tests  Recent Labs  01/22/17 2320  TSH 1.233    Telemetry    Sinus with period of nocturnal bradycardia- Personally Reviewed  ECG    Sinus brady HR 59 - Personally Reviewed  Radiology    Dg Chest 2 View  Result Date: 01/22/2017 CLINICAL DATA:  Chest pain. EXAM: CHEST  2 VIEW COMPARISON:  12/24/2016 . FINDINGS: Mediastinum and hilar structures normal. Low lung volumes with mild basilar atelectasis. No pleural effusion or pneumothorax. No acute bony abnormality. IMPRESSION: Low lung volumes with mild basilar atelectasis. Electronically Signed   By: Marcello Moores  Register   On: 01/22/2017 11:37   Mr Brain Wo Contrast  Result Date: 01/23/2017 CLINICAL DATA:  Initial evaluation for acute left-sided numbness. EXAM: MRI HEAD WITHOUT CONTRAST TECHNIQUE: Multiplanar, multiecho pulse sequences of the brain and surrounding structures were obtained without intravenous contrast. COMPARISON:  Prior CT from 04/03/2016. FINDINGS: Brain: Cerebral volume within normal limits for age. Few tiny subcentimeter foci of  T2/FLAIR hyperintense foci noted within the periventricular and deep white matter both cerebral hemispheres, nonspecific, but felt to be within normal limits for age. No abnormal foci of restricted diffusion to suggest acute or subacute ischemia. Gray-white matter differentiation well maintained. No evidence for chronic infarction. No evidence for acute or chronic intracranial hemorrhage. No mass lesion, midline shift or mass effect. No hydrocephalus. No extra-axial fluid collection. Major dural sinuses are grossly patent. Pituitary gland suprasellar region within normal limits. Midline structures intact and normal. Vascular: Major intracranial vascular flow voids are maintained. Skull and upper cervical spine: Craniocervical junction normal. Visualized upper cervical spine unremarkable. Bone marrow signal intensity within normal limits. No scalp soft tissue abnormality. Sinuses/Orbits: Globes and orbital soft tissues within normal limits. Extensive mucosal thickening throughout the paranasal sinuses, likely allergic/ inflammatory nature. No mastoid effusion. Inner ear structures normal. IMPRESSION: 1. Normal brain MRI.  No acute intracranial process identified. 2. Extensive pan sinus disease. Electronically Signed   By: Jeannine Boga M.D.   On: 01/23/2017 22:14   Nm Myocar Multi W/spect W/wall Motion / Ef  Result Date: 01/24/2017 CLINICAL DATA:  Chest pain, syncope and atrial fibrillation. History of diabetes hypertension and elevated cholesterol. EXAM: MYOCARDIAL IMAGING WITH SPECT (REST AND PHARMACOLOGIC-STRESS - 2 DAY PROTOCOL) GATED LEFT VENTRICULAR WALL MOTION STUDY LEFT VENTRICULAR EJECTION FRACTION TECHNIQUE: Standard myocardial SPECT imaging was performed after resting intravenous injection of 10 mCi Tc-88m tetrofosmin. Subsequently, on a second day, intravenous infusion of Lexiscan was performed under the supervision of the Cardiology staff. At peak effect of the drug, 30 mCi Tc-28m tetrofosmin  was injected intravenously and standard myocardial SPECT imaging was performed. Quantitative gated imaging was also performed to evaluate left ventricular wall motion, and estimate left ventricular ejection fraction. COMPARISON:  None. FINDINGS: Perfusion: There is a large area of mild reversibility involving the apex and mid and distal lateral wall. Summed difference score equal 6. Wall Motion: Normal left ventricular wall motion. No left ventricular dilation. Left Ventricular Ejection Fraction: 52 % End diastolic volume 627 ml End systolic volume 79 ml IMPRESSION: 1. Large area of mild reversibility is identified involving the apex and lateral wall. This has a summed difference score equal to 6. 2. Normal left ventricular wall motion. 3. Left ventricular ejection fraction 52% 4. Non invasive risk stratification*: High *2012 Appropriate Use Criteria for Coronary Revascularization Focused Update: J Am Coll Cardiol. 0350;09(3):818-299. http://content.airportbarriers.com.aspx?articleid=1201161 These results will be called to the ordering clinician or representative by the Radiologist  Assistant, and communication documented in the PACS or zVision Dashboard. Electronically Signed   By: Kerby Moors M.D.   On: 01/24/2017 10:18    Cardiac Studies   Nuclear stress test 01/24/17 IMPRESSION: 1. Large area of mild reversibility is identified involving the apex and lateral wall. This has a summed difference score equal to 6. 2. Normal left ventricular wall motion. 3. Left ventricular ejection fraction 52% 4. Non invasive risk stratification*  2D ECHO: 01/23/2017 LV EF: 60% -   65% Study Conclusions - Left ventricle: The cavity size was mildly dilated. There was   moderate focal basal hypertrophy. Systolic function was normal.   The estimated ejection fraction was in the range of 60% to 65%.   Wall motion was normal; there were no regional wall motion   abnormalities. Features are consistent with a  pseudonormal left   ventricular filling pattern, with concomitant abnormal relaxation   and increased filling pressure (grade 2 diastolic dysfunction).   Doppler parameters are consistent with high ventricular filling   pressure. - Aorta: Ascending aorta diameter: 38 mm (ED). - Ascending aorta: The ascending aorta was mildly dilated. - Mitral valve: Valve area by pressure half-time: 2.27 cm^2. - Right ventricle: The cavity size was severely dilated. Wall   thickness was normal.   Patient Profile     [name] is a 51 y.o. male with a history of obesity, PAF on TIkosyn and Eliquis, OSA on CPAP, HTN, HLD, GERD who presented to [facility] ED yesterday with left-sided sharp/pressure type chest pain associated with shortness of breath, lightheadedness and left arm numbness  Assessment & Plan    Chest pain: he has ruled out for MI with negative cardiac enzymes and nonacute ECG. 2 day nuclear stress test is abnormal with a large area of mild reversibility in the apex and lateral wall. This was read as high risk. Plan was for cardiac catheterization later this afternoon; however, patient took his Eliquis this morning. Plan will the to hold Eliquis Sunday and Monday and plan for cardiac catheterization on Monday with Dr. Claiborne Billings at ~1:30pm.   I have reviewed the risks, indications, and alternatives to cardiac catheterization and possible angioplasty/stenting with the patient. Risks include but are not limited to bleeding, infection, vascular injury, stroke, myocardial infection, arrhythmia, kidney injury, radiation-related injury in the case of prolonged fluoroscopy use, emergency cardiac surgery, and death. The patient understands the risks of serious complication is low (<3%).   PAF: maintaining sinus rhythm on Tikosyn. CHADSVASC of at least 1 (HTN). Continue Eliquis for now but will hold on Sunday.   Bradycardia: HR now improved, but does go into 40s while sleeping.   OSA: continue CPAP  GERD:  continue PPI   Signed, Angelena Form, PA-C  01/24/2017, 10:45 AM   The patient was seen, examined and discussed with Nell Range, PA-C and I agree with the above.   [name] is a 51 y.o. male with a hx of PAF on Tikosyn,  who is being seen today for the evaluation of chest pain. 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 He called the office and was directed to the hospital. He has been having sharp left-sided chest pain at least since May and was arranged for a stress test to be done on July 2. He has had similar such pain in 2016 at which time he had a negative  stress test. His chest pain is reproducible on palpation ,otherwise he is chest pain or SOB free right now. ECG shows SB, telemetry shows SB down to 40' and he states that it makes him feel dizzy. No syncope.  Troponins negative 3.  A 2 day Lexiscan nuclear stress test was positive for ischemia I the basal and mid inferior and inferolateral walls (SDS=6), we will arrange for a cath on Monday since he took Eliquis this am. Bradycardia has improved, the lowest was 48 while at sleep, otherwise in 50-60'.  Echocardiogram in November 2017 showed LVEF 65-70%, mildly dilated left atrium. He developed a migraine headache this am and is being started on Toradol and Reglan by neurology.   Ena Dawley, MD 01/24/2017

## 2017-01-25 DIAGNOSIS — R9439 Abnormal result of other cardiovascular function study: Secondary | ICD-10-CM

## 2017-01-25 DIAGNOSIS — I200 Unstable angina: Secondary | ICD-10-CM | POA: Diagnosis not present

## 2017-01-25 DIAGNOSIS — G43809 Other migraine, not intractable, without status migrainosus: Secondary | ICD-10-CM

## 2017-01-25 DIAGNOSIS — R079 Chest pain, unspecified: Secondary | ICD-10-CM | POA: Diagnosis not present

## 2017-01-25 LAB — BASIC METABOLIC PANEL
Anion gap: 8 (ref 5–15)
BUN: 11 mg/dL (ref 6–20)
CHLORIDE: 106 mmol/L (ref 101–111)
CO2: 27 mmol/L (ref 22–32)
CREATININE: 0.93 mg/dL (ref 0.61–1.24)
Calcium: 9.1 mg/dL (ref 8.9–10.3)
GFR calc Af Amer: >60 mL/min (ref >60)
GLUCOSE: 99 mg/dL (ref 65–99)
Potassium: 3.8 mmol/L (ref 3.5–5.1)
SODIUM: 141 mmol/L (ref 135–145)

## 2017-01-25 LAB — CBC
HCT: 40.4 % (ref 39.0–52.0)
Hemoglobin: 13 g/dL (ref 13.0–17.0)
MCH: 26.6 pg (ref 26.0–34.0)
MCHC: 32.2 g/dL (ref 30.0–36.0)
MCV: 82.8 fL (ref 78.0–100.0)
PLATELETS: 206 10*3/uL (ref 150–400)
RBC: 4.88 MIL/uL (ref 4.22–5.81)
RDW: 13.3 % (ref 11.5–15.5)
WBC: 8.3 10*3/uL (ref 4.0–10.5)

## 2017-01-25 MED ORDER — APIXABAN 5 MG PO TABS
5.0000 mg | ORAL_TABLET | Freq: Two times a day (BID) | ORAL | Status: AC
  Administered 2017-01-25 (×2): 5 mg via ORAL
  Filled 2017-01-25 (×2): qty 1

## 2017-01-25 MED ORDER — IBUPROFEN 600 MG PO TABS
600.0000 mg | ORAL_TABLET | Freq: Once | ORAL | Status: AC
  Administered 2017-01-25: 600 mg via ORAL
  Filled 2017-01-25: qty 1

## 2017-01-25 MED ORDER — POTASSIUM CHLORIDE CRYS ER 20 MEQ PO TBCR
40.0000 meq | EXTENDED_RELEASE_TABLET | Freq: Once | ORAL | Status: AC
  Administered 2017-01-25: 40 meq via ORAL

## 2017-01-25 MED ORDER — FLUTICASONE PROPIONATE 50 MCG/ACT NA SUSP
1.0000 | NASAL | Status: DC | PRN
  Administered 2017-01-25: 1 via NASAL
  Filled 2017-01-25: qty 16

## 2017-01-25 MED ORDER — METOCLOPRAMIDE HCL 10 MG PO TABS
10.0000 mg | ORAL_TABLET | Freq: Once | ORAL | Status: AC
  Administered 2017-01-25: 10 mg via ORAL
  Filled 2017-01-25: qty 1

## 2017-01-25 MED ORDER — LORATADINE 10 MG PO TABS
10.0000 mg | ORAL_TABLET | Freq: Every day | ORAL | Status: DC
  Administered 2017-01-25 – 2017-01-28 (×4): 10 mg via ORAL
  Filled 2017-01-25 (×4): qty 1

## 2017-01-25 NOTE — Progress Notes (Signed)
Family Medicine Teaching Service Daily Progress Note Intern Pager: [phone]  Patient name: [name] Medical record number: [id number] Date of birth: March 15, 1966 Age: 51 y.o. Gender: male  Primary Care Provider: [name], MD Consultants: Neurology (signed off), Cardiology Code Status: full  Pt Overview and Major Events to Date:  6/20 admitted for chest pain  Assessment and Plan: [name] is a 51 y.o. male presenting with chest pain. PMH is significant for afib on eliquis, OSA, chronic back pain, HTN, HLD, migraines, obesity, and GERD.  Chest pain.  Exercise nuclear stress test high risk with large area of mild reversibility - monitor on telemetry - vitals per floor - cardiology following, hold eliquis Sunday and Monday for cardiac cath on Monday ~1:30pm - risk stratification labs: LDL 108, TSH 1.233, A1c 5.6 - continue ASA 81  Headache with L temporal hemifacial spam. Likely due to symptomatic migraine.  - Per neurology, toradol and reglan for migraine. Signed off  Afib- on eliquis and tikosyn, currently in NSR. HR in 60s -tele -continue eliquis and tikosyn  -monitor K, will keep >4  OSA -uses CPAP at night, continue in hospital  HLD.   ASCVD 39yr risk 6.9%  - atorvastatin 20mg  qd  Allergies Per patient, at home on zyrtec daily and flonase as needed  - Claritin 10mg  qd and flonase prn  FEN/GI: heart healthy diet, PPI Prophylaxis: eliquis  Disposition: pending cardiac cath on 6/25  Subjective:  Feels well today. L sided HA yesterday has resolved and has no returned, no twitching, no vision changes. Does have some allergies this morning with some sinus discomfort, asking for his home zyrtec and flonase. No CP or palpitations  Objective: Temp:  [97.7 F (36.5 C)-98.4 F (36.9 C)] 98.4 F (36.9 C) (06/23 0615) Pulse Rate:  [60-67] 63 (06/23 0615) Resp:  [19-20] 20 (06/23 0615) BP: (110-124)/(55-59) 124/56 (06/23 0615) SpO2:  [96 %-97 %] 97 % (06/23  0615) Weight:  [304 lb 12.8 oz (138.3 kg)] 304 lb 12.8 oz (138.3 kg) (06/23 0615) Physical Exam: General: Sitting up in bed, in NAD Cardiovascular: RRR, no murmurs Respiratory: CTAB, normal effort on room air Abdomen: soft, nontender, nondistended, + bowel sounds Extremities: no LE edema  Laboratory:  Recent Labs Lab 01/23/17 0444 01/24/17 0315 01/25/17 0245  WBC 4.5 6.8 8.3  HGB 10.8* 13.2 13.0  HCT 33.7* 41.5 40.4  PLT 144* 224 206    Recent Labs Lab 01/23/17 0444 01/24/17 0315 01/25/17 0245  NA 141 141 141  K 3.8 3.8 3.8  CL 104 107 106  CO2 30 27 27   BUN 12 10 11   CREATININE 0.96 0.86 0.93  CALCIUM 9.3 9.2 9.1  GLUCOSE 85 109* 99   Lipid Panel     Component Value Date/Time   CHOL 179 01/22/2017 2320   TRIG 230 (H) 01/22/2017 2320   HDL 25 (L) 01/22/2017 2320   CHOLHDL 7.2 01/22/2017 2320   VLDL 46 (H) 01/22/2017 2320   LDLCALC 108 (H) 01/22/2017 2320     Imaging/Diagnostic Tests: Nm Myocar Multi W/spect W/wall Motion / Ef  Result Date: 01/24/2017 CLINICAL DATA:  Chest pain, syncope and atrial fibrillation. History of diabetes hypertension and elevated cholesterol. EXAM: MYOCARDIAL IMAGING WITH SPECT (REST AND PHARMACOLOGIC-STRESS - 2 DAY PROTOCOL) GATED LEFT VENTRICULAR WALL MOTION STUDY LEFT VENTRICULAR EJECTION FRACTION TECHNIQUE: Standard myocardial SPECT imaging was performed after resting intravenous injection of 10 mCi Tc-100m tetrofosmin. Subsequently, on a second day, intravenous infusion of Lexiscan was performed under the  supervision of the Cardiology staff. At peak effect of the drug, 30 mCi Tc-63m tetrofosmin was injected intravenously and standard myocardial SPECT imaging was performed. Quantitative gated imaging was also performed to evaluate left ventricular wall motion, and estimate left ventricular ejection fraction. COMPARISON:  None. FINDINGS: Perfusion: There is a large area of mild reversibility involving the apex and mid and distal lateral  wall. Summed difference score equal 6. Wall Motion: Normal left ventricular wall motion. No left ventricular dilation. Left Ventricular Ejection Fraction: 52 % End diastolic volume 962 ml End systolic volume 79 ml IMPRESSION: 1. Large area of mild reversibility is identified involving the apex and lateral wall. This has a summed difference score equal to 6. 2. Normal left ventricular wall motion. 3. Left ventricular ejection fraction 52% 4. Non invasive risk stratification*: High *2012 Appropriate Use Criteria for Coronary Revascularization Focused Update: J Am Coll Cardiol. 8366;29(4):765-465. http://content.airportbarriers.com.aspx?articleid=1201161 These results will be called to the ordering clinician or representative by the Radiologist Assistant, and communication documented in the PACS or zVision Dashboard. Electronically Signed   By: Kerby Moors M.D.   On: 01/24/2017 10:18    Bufford Lope, DO 01/25/2017, 7:29 AM PGY-1, [place] Intern pager: [phone], text pages welcome

## 2017-01-25 NOTE — Progress Notes (Signed)
Patient Name: [name] Date of Encounter: 01/25/2017  Primary Cardiologist: Dr. Acie Fredrickson,  Electrophysiologist:  Dr. Aggie Cosier NP for [facility] Problem List     Active Problems:   Chest pain   Left sided numbness   Muscle fasciculation   Abnormal stress test   Symptomatic sinus bradycardia     Subjective   Had some sharp chest pains this AM when he got up.   Inpatient Medications    Scheduled Meds: . apixaban  5 mg Oral BID  . aspirin EC  81 mg Oral Daily  . atorvastatin  20 mg Oral q1800  . dofetilide  500 mcg Oral BID  . potassium chloride  40 mEq Oral Once   Continuous Infusions:  PRN Meds: acetaminophen, gi cocktail, ondansetron (ZOFRAN) IV   Vital Signs    Vitals:   01/24/17 0527 01/24/17 1431 01/24/17 2023 01/25/17 0615  BP: 121/62 (!) 110/55 (!) 123/59 (!) 124/56  Pulse: 67 60 67 63  Resp: 18  19 20   Temp: 97.9 F (36.6 C) 97.7 F (36.5 C) 98.2 F (36.8 C) 98.4 F (36.9 C)  TempSrc: Axillary Axillary    SpO2: 97% 97% 96% 97%  Weight: (!) 138.3 kg (305 lb)   (!) 138.3 kg (304 lb 12.8 oz)  Height: 6\' 2"  (1.88 m)       Intake/Output Summary (Last 24 hours) at 01/25/17 0756 Last data filed at 01/25/17 0615  Gross per 24 hour  Intake              240 ml  Output                0 ml  Net              240 ml   Filed Weights   01/23/17 0300 01/24/17 0527 01/25/17 0615  Weight: (!) 136.1 kg (300 lb 1.6 oz) (!) 138.3 kg (305 lb) (!) 138.3 kg (304 lb 12.8 oz)    Physical Exam   GEN: Well nourished, well developed, in no acute distress. obese HEENT: Grossly normal.  Neck: Supple, no JVD, carotid bruits, or masses. Cardiac: RRR, no murmurs, rubs, or gallops. No clubbing, cyanosis, edema.  Radials/DP/PT 2+ and equal bilaterally.  Respiratory:  Respirations regular and unlabored, clear to auscultation bilaterally. GI: Soft, nontender, nondistended, BS + x 4. MS: no deformity or atrophy. Skin: warm and dry, no rash. Neuro:   Strength and sensation are intact. Psych: AAOx3.  Normal affect.  Labs    CBC  Recent Labs  01/22/17 1057  01/24/17 0315 01/25/17 0245  WBC 7.9  < > 6.8 8.3  NEUTROABS 4.3  --   --   --   HGB 13.8  < > 13.2 13.0  HCT 41.6  < > 41.5 40.4  MCV 81.4  < > 83.8 82.8  PLT 251  < > 224 206  < > = values in this interval not displayed. Basic Metabolic Panel  Recent Labs  01/24/17 0315 01/25/17 0245  NA 141 141  K 3.8 3.8  CL 107 106  CO2 27 27  GLUCOSE 109* 99  BUN 10 11  CREATININE 0.86 0.93  CALCIUM 9.2 9.1  MG 2.2  --    Liver Function Tests No results for input(s): AST, ALT, ALKPHOS, BILITOT, PROT, ALBUMIN in the last 72 hours. No results for input(s): LIPASE, AMYLASE in the last 72 hours. Cardiac Enzymes  Recent Labs  01/22/17 1659 01/22/17 1928 01/22/17 2320  TROPONINI <0.03 <0.03 <0.03   BNP Invalid input(s): POCBNP D-Dimer No results for input(s): DDIMER in the last 72 hours. Hemoglobin A1C  Recent Labs  01/22/17 2320  HGBA1C 5.6   Fasting Lipid Panel  Recent Labs  01/22/17 2320  CHOL 179  HDL 25*  LDLCALC 108*  TRIG 230*  CHOLHDL 7.2   Thyroid Function Tests  Recent Labs  01/22/17 2320  TSH 1.233    Telemetry    Sinus with period of nocturnal bradycardia- Personally Reviewed  ECG    Sinus brady HR 84 - Personally Reviewed  Radiology    Mr Brain Wo Contrast  Result Date: 01/23/2017 CLINICAL DATA:  Initial evaluation for acute left-sided numbness. EXAM: MRI HEAD WITHOUT CONTRAST TECHNIQUE: Multiplanar, multiecho pulse sequences of the brain and surrounding structures were obtained without intravenous contrast. COMPARISON:  Prior CT from 04/03/2016. FINDINGS: Brain: Cerebral volume within normal limits for age. Few tiny subcentimeter foci of T2/FLAIR hyperintense foci noted within the periventricular and deep white matter both cerebral hemispheres, nonspecific, but felt to be within normal limits for age. No abnormal foci of  restricted diffusion to suggest acute or subacute ischemia. Gray-white matter differentiation well maintained. No evidence for chronic infarction. No evidence for acute or chronic intracranial hemorrhage. No mass lesion, midline shift or mass effect. No hydrocephalus. No extra-axial fluid collection. Major dural sinuses are grossly patent. Pituitary gland suprasellar region within normal limits. Midline structures intact and normal. Vascular: Major intracranial vascular flow voids are maintained. Skull and upper cervical spine: Craniocervical junction normal. Visualized upper cervical spine unremarkable. Bone marrow signal intensity within normal limits. No scalp soft tissue abnormality. Sinuses/Orbits: Globes and orbital soft tissues within normal limits. Extensive mucosal thickening throughout the paranasal sinuses, likely allergic/ inflammatory nature. No mastoid effusion. Inner ear structures normal. IMPRESSION: 1. Normal brain MRI.  No acute intracranial process identified. 2. Extensive pan sinus disease. Electronically Signed   By: Jeannine Boga M.D.   On: 01/23/2017 22:14   Nm Myocar Multi W/spect W/wall Motion / Ef  Result Date: 01/24/2017 CLINICAL DATA:  Chest pain, syncope and atrial fibrillation. History of diabetes hypertension and elevated cholesterol. EXAM: MYOCARDIAL IMAGING WITH SPECT (REST AND PHARMACOLOGIC-STRESS - 2 DAY PROTOCOL) GATED LEFT VENTRICULAR WALL MOTION STUDY LEFT VENTRICULAR EJECTION FRACTION TECHNIQUE: Standard myocardial SPECT imaging was performed after resting intravenous injection of 10 mCi Tc-32m tetrofosmin. Subsequently, on a second day, intravenous infusion of Lexiscan was performed under the supervision of the Cardiology staff. At peak effect of the drug, 30 mCi Tc-46m tetrofosmin was injected intravenously and standard myocardial SPECT imaging was performed. Quantitative gated imaging was also performed to evaluate left ventricular wall motion, and estimate left  ventricular ejection fraction. COMPARISON:  None. FINDINGS: Perfusion: There is a large area of mild reversibility involving the apex and mid and distal lateral wall. Summed difference score equal 6. Wall Motion: Normal left ventricular wall motion. No left ventricular dilation. Left Ventricular Ejection Fraction: 52 % End diastolic volume 585 ml End systolic volume 79 ml IMPRESSION: 1. Large area of mild reversibility is identified involving the apex and lateral wall. This has a summed difference score equal to 6. 2. Normal left ventricular wall motion. 3. Left ventricular ejection fraction 52% 4. Non invasive risk stratification*: High *2012 Appropriate Use Criteria for Coronary Revascularization Focused Update: J Am Coll Cardiol. 2778;24(2):353-614. http://content.airportbarriers.com.aspx?articleid=1201161 These results will be called to the ordering clinician or representative by the Radiologist Assistant, and communication documented in the PACS or zVision Dashboard. Electronically  Signed   By: Kerby Moors M.D.   On: 01/24/2017 10:18    Cardiac Studies   Nuclear stress test 01/24/17 IMPRESSION: 1. Large area of mild reversibility is identified involving the apex and lateral wall. This has a summed difference score equal to 6. 2. Normal left ventricular wall motion. 3. Left ventricular ejection fraction 52% 4. Non invasive risk stratification*  2D ECHO: 01/23/2017 LV EF: 60% -   65% Study Conclusions - Left ventricle: The cavity size was mildly dilated. There was   moderate focal basal hypertrophy. Systolic function was normal.   The estimated ejection fraction was in the range of 60% to 65%.   Wall motion was normal; there were no regional wall motion   abnormalities. Features are consistent with a pseudonormal left   ventricular filling pattern, with concomitant abnormal relaxation   and increased filling pressure (grade 2 diastolic dysfunction).   Doppler parameters are consistent with  high ventricular filling   pressure. - Aorta: Ascending aorta diameter: 38 mm (ED). - Ascending aorta: The ascending aorta was mildly dilated. - Mitral valve: Valve area by pressure half-time: 2.27 cm^2. - Right ventricle: The cavity size was severely dilated. Wall   thickness was normal.   Patient Profile     [name] is a 51 y.o. male with a history of obesity, PAF on TIkosyn and Eliquis, OSA on CPAP, HTN, HLD, GERD who presented to [facility] ED yesterday with left-sided sharp/pressure type chest pain associated with shortness of breath, lightheadedness and left arm numbness  Assessment & Plan    Chest pain: he has ruled out for MI with negative cardiac enzymes and nonacute ECG. 2 day nuclear stress test is abnormal with a large area of mild reversibility in the apex and lateral wall. This was read as high risk. . Plan will the to hold Eliquis Sunday and Monday and plan for cardiac catheterization on Monday with Dr. Claiborne Billings at ~1:30pm.   I have reviewed the risks, indications, and alternatives to cardiac catheterization and possible angioplasty/stenting with the patient. Risks include but are not limited to bleeding, infection, vascular injury, stroke, myocardial infection, arrhythmia, kidney injury, radiation-related injury in the case of prolonged fluoroscopy use, emergency cardiac surgery, and death. The patient understands the risks of serious complication is low (<7%).   PAF: maintaining sinus rhythm on Tikosyn. CHADSVASC of at least 1 (HTN). Continue Eliquis for now but will hold on Sunday.   Bradycardia: HR now improved, but does go into 40s while sleeping.   OSA: continue CPAP  GERD: continue PPI   Jenkins Rouge  01/25/2017

## 2017-01-25 NOTE — Progress Notes (Signed)
Patient states that he will place his CPAP on for the night. RT will continue to monitor as needed.

## 2017-01-26 DIAGNOSIS — R253 Fasciculation: Secondary | ICD-10-CM | POA: Diagnosis not present

## 2017-01-26 DIAGNOSIS — R079 Chest pain, unspecified: Secondary | ICD-10-CM | POA: Diagnosis not present

## 2017-01-26 LAB — BASIC METABOLIC PANEL
ANION GAP: 6 (ref 5–15)
BUN: 13 mg/dL (ref 6–20)
CO2: 28 mmol/L (ref 22–32)
Calcium: 9.2 mg/dL (ref 8.9–10.3)
Chloride: 107 mmol/L (ref 101–111)
Creatinine, Ser: 0.94 mg/dL (ref 0.61–1.24)
Glucose, Bld: 94 mg/dL (ref 65–99)
POTASSIUM: 4.4 mmol/L (ref 3.5–5.1)
Sodium: 141 mmol/L (ref 135–145)

## 2017-01-26 MED ORDER — POLYETHYLENE GLYCOL 3350 17 G PO PACK
17.0000 g | PACK | Freq: Every day | ORAL | Status: DC
  Administered 2017-01-26: 17 g via ORAL
  Filled 2017-01-26 (×2): qty 1

## 2017-01-26 MED ORDER — PANTOPRAZOLE SODIUM 20 MG PO TBEC
20.0000 mg | DELAYED_RELEASE_TABLET | Freq: Every day | ORAL | Status: DC
  Administered 2017-01-26 – 2017-01-28 (×3): 20 mg via ORAL
  Filled 2017-01-26 (×3): qty 1

## 2017-01-26 NOTE — Progress Notes (Signed)
Family Medicine Teaching Service Daily Progress Note Intern Pager: [phone]  Patient name: [name] Medical record number: [id number] Date of birth: [birth year] Age: 51 y.o. Gender: male  Primary Care Provider: [name], MD Consultants: Neurology (signed off), Cardiology Code Status: full  Pt Overview and Major Events to Date:  6/20 admitted for chest pain  Assessment and Plan: [name] is a 51 y.o. male presenting with chest pain. PMH is significant for afib on eliquis, OSA, chronic back pain, HTN, HLD, migraines, obesity, and GERD.  Chest pain: ACS event has been ruled out with negative troponins and no acute EKG findings.  Exercise nuclear stress test high risk with large area of mild reversibility. Risk stratification labs: LDL 108, TSH 1.233, A1c 5.6. Continues to have intermittent twinges of chest pain - monitor on telemetry - vitals per floor - continue ASA 27 - cardiology consulted, appreciate their recommendations  -Hold Eiquis and plan is for cardiac cath on Monday ~1:30pm  Headache with L temporal hemifacial spam. Likely due to symptomatic migraine.  symptoms resolved today.  - Per neurology, toradol and reglan for migraine. Signed off -Continue to monitor   Afib- on eliquis and tikosyn, currently in NSR. HR documented as being 47 this morning however was in the 60s in exam room -tele -Holding eliquis as he is having a cath tomorrow -Continue Tikosyn  -monitor K, will keep >4  OSA -uses CPAP at night, continue in hospital  HLD.   ASCVD 78yr risk 6.9%  - atorvastatin 20mg  qd  Allergies Per patient, at home on zyrtec daily and flonase as needed  - Claritin 10mg  qd and flonase prn  FEN/GI: heart healthy diet, PPI Prophylaxis: eliquis  Disposition: pending cardiac cath on 6/25  Subjective:  No acute events overnight. Patient states that he feels well. Had a couple twinges of chest pain this morning, no shortness of breath. Denies any headaches.  States that he has racing cardiology this morning and plan is for a cath tomorrow.  endorse that he had a bowel movement last night but it was hard. He usually takes Metamucil at home.  Objective: Temp:  [97.7 F (36.5 C)-98.8 F (37.1 C)] 97.7 F (36.5 C) (06/24 0510) Pulse Rate:  [55-64] 64 (06/24 0510) Resp:  [18-19] 19 (06/24 0510) BP: (111-135)/(62-83) 111/67 (06/24 0510) SpO2:  [96 %-98 %] 98 % (06/24 0510) Weight:  [298 lb 11.2 oz (135.5 kg)] 298 lb 11.2 oz (135.5 kg) (06/24 0510) Physical Exam: General: Sitting up in chair, in NAD Cardiovascular: RRR, no murmurs Respiratory: CTAB, normal effort on room air Abdomen: soft, nontender, nondistended, + bowel sounds Extremities: no LE edema  Laboratory:  Recent Labs Lab 01/23/17 0444 01/24/17 0315 01/25/17 0245  WBC 4.5 6.8 8.3  HGB 10.8* 13.2 13.0  HCT 33.7* 41.5 40.4  PLT 144* 224 206    Recent Labs Lab 01/24/17 0315 01/25/17 0245 01/26/17 0255  NA 141 141 141  K 3.8 3.8 4.4  CL 107 106 107  CO2 27 27 28   BUN 10 11 13   CREATININE 0.86 0.93 0.94  CALCIUM 9.2 9.1 9.2  GLUCOSE 109* 99 94   Lipid Panel     Component Value Date/Time   CHOL 179 01/22/2017 2320   TRIG 230 (H) 01/22/2017 2320   HDL 25 (L) 01/22/2017 2320   CHOLHDL 7.2 01/22/2017 2320   VLDL 46 (H) 01/22/2017 2320   LDLCALC 108 (H) 01/22/2017 2320     Imaging/Diagnostic Tests: No results found.  Juanito Doom,  Arlie Solomons, MD 01/26/2017, 7:39 AM PGY-2, [place] Intern pager: [phone], text pages welcome

## 2017-01-26 NOTE — Progress Notes (Signed)
Pt states he does not need assistance with CPAP tonight. Advised pt if he needs anything to notify RN. RT will continue to monitor.

## 2017-01-26 NOTE — Progress Notes (Signed)
Patient Name: [name] Date of Encounter: 01/26/2017  Primary Cardiologist: Dr. Acie Fredrickson,  Electrophysiologist:  Dr. Aggie Cosier NP for [facility] Problem List     Active Problems:   Chest pain   Left sided numbness   Muscle fasciculation   Abnormal stress test   Symptomatic sinus bradycardia     Subjective   Had some sharp chest pains this AM when he got up.   Inpatient Medications    Scheduled Meds: . aspirin EC  81 mg Oral Daily  . atorvastatin  20 mg Oral q1800  . dofetilide  500 mcg Oral BID  . loratadine  10 mg Oral Daily   Continuous Infusions:  PRN Meds: acetaminophen, fluticasone, gi cocktail, ondansetron (ZOFRAN) IV   Vital Signs    Vitals:   01/25/17 0615 01/25/17 1437 01/25/17 2059 01/26/17 0510  BP: (!) 124/56 134/62 135/83 111/67  Pulse: 63 (!) 55 63 64  Resp: 20  18 19   Temp: 98.4 F (36.9 C) 98.1 F (36.7 C) 98.8 F (37.1 C) 97.7 F (36.5 C)  TempSrc:  Oral    SpO2: 97% 97% 96% 98%  Weight: (!) 138.3 kg (304 lb 12.8 oz)   135.5 kg (298 lb 11.2 oz)  Height:        Intake/Output Summary (Last 24 hours) at 01/26/17 0922 Last data filed at 01/26/17 0512  Gross per 24 hour  Intake              750 ml  Output                0 ml  Net              750 ml   Filed Weights   01/24/17 0527 01/25/17 0615 01/26/17 0510  Weight: (!) 138.3 kg (305 lb) (!) 138.3 kg (304 lb 12.8 oz) 135.5 kg (298 lb 11.2 oz)    Physical Exam   GEN: Well nourished, well developed, in no acute distress. obese HEENT: Grossly normal.  Neck: Supple, no JVD, carotid bruits, or masses. Cardiac: RRR, no murmurs, rubs, or gallops. No clubbing, cyanosis, edema.  Radials/DP/PT 2+ and equal bilaterally.  Respiratory:  Respirations regular and unlabored, clear to auscultation bilaterally. GI: Soft, nontender, nondistended, BS + x 4. MS: no deformity or atrophy. Skin: warm and dry, no rash. Neuro:  Strength and sensation are intact. Psych: AAOx3.   Normal affect.  Labs    CBC  Recent Labs  01/24/17 0315 01/25/17 0245  WBC 6.8 8.3  HGB 13.2 13.0  HCT 41.5 40.4  MCV 83.8 82.8  PLT 224 941   Basic Metabolic Panel  Recent Labs  01/24/17 0315 01/25/17 0245 01/26/17 0255  NA 141 141 141  K 3.8 3.8 4.4  CL 107 106 107  CO2 27 27 28   GLUCOSE 109* 99 94  BUN 10 11 13   CREATININE 0.86 0.93 0.94  CALCIUM 9.2 9.1 9.2  MG 2.2  --   --    Liver Function Tests No results for input(s): AST, ALT, ALKPHOS, BILITOT, PROT, ALBUMIN in the last 72 hours. No results for input(s): LIPASE, AMYLASE in the last 72 hours. Cardiac Enzymes No results for input(s): CKTOTAL, CKMB, CKMBINDEX, TROPONINI in the last 72 hours. BNP Invalid input(s): POCBNP D-Dimer No results for input(s): DDIMER in the last 72 hours. Hemoglobin A1C No results for input(s): HGBA1C in the last 72 hours. Fasting Lipid Panel No results for input(s): CHOL, HDL, LDLCALC, TRIG, CHOLHDL,  LDLDIRECT in the last 72 hours. Thyroid Function Tests No results for input(s): TSH, T4TOTAL, T3FREE, THYROIDAB in the last 72 hours.  Invalid input(s): FREET3  Telemetry    Sinus with period of nocturnal bradycardia- Personally Reviewed  ECG    Sinus brady HR 59 - Personally Reviewed  Radiology    No results found.  Cardiac Studies   Nuclear stress test 01/24/17 IMPRESSION: 1. Large area of mild reversibility is identified involving the apex and lateral wall. This has a summed difference score equal to 6. 2. Normal left ventricular wall motion. 3. Left ventricular ejection fraction 52% 4. Non invasive risk stratification*  2D ECHO: 01/23/2017 LV EF: 60% -   65% Study Conclusions - Left ventricle: The cavity size was mildly dilated. There was   moderate focal basal hypertrophy. Systolic function was normal.   The estimated ejection fraction was in the range of 60% to 65%.   Wall motion was normal; there were no regional wall motion   abnormalities. Features are  consistent with a pseudonormal left   ventricular filling pattern, with concomitant abnormal relaxation   and increased filling pressure (grade 2 diastolic dysfunction).   Doppler parameters are consistent with high ventricular filling   pressure. - Aorta: Ascending aorta diameter: 38 mm (ED). - Ascending aorta: The ascending aorta was mildly dilated. - Mitral valve: Valve area by pressure half-time: 2.27 cm^2. - Right ventricle: The cavity size was severely dilated. Wall   thickness was normal.   Patient Profile     [name] is a 51 y.o. male with a history of obesity, PAF on TIkosyn and Eliquis, OSA on CPAP, HTN, HLD, GERD who presented to [facility] ED yesterday with left-sided sharp/pressure type chest pain associated with shortness of breath, lightheadedness and left arm numbness  Assessment & Plan    Chest pain: he has ruled out for MI with negative cardiac enzymes and nonacute ECG. 2 day nuclear stress test is abnormal with a large area of mild reversibility in the apex and lateral wall. This was read as high risk. . Eliquis held today and in am   cardiac catheterization on Monday with Dr. Claiborne Billings at ~1:30pm.   I have reviewed the risks, indications, and alternatives to cardiac catheterization and possible angioplasty/stenting with the patient. Risks include but are not limited to bleeding, infection, vascular injury, stroke, myocardial infection, arrhythmia, kidney injury, radiation-related injury in the case of prolonged fluoroscopy use, emergency cardiac surgery, and death. The patient understands the risks of serious complication is low (<6%).   PAF: maintaining sinus rhythm on Tikosyn. CHADSVASC of at least 1 (HTN). Hold elquis today   Bradycardia: HR now improved, but does go into 40s while sleeping.   OSA: continue CPAP  GERD: continue PPI   Jenkins Rouge  01/26/2017

## 2017-01-27 ENCOUNTER — Encounter (HOSPITAL_COMMUNITY)
Admission: EM | Disposition: A | Discharge status: Home / Self Care | Payer: Self-pay | Source: Home / Self Care | Attending: Family Medicine

## 2017-01-27 DIAGNOSIS — R0789 Other chest pain: Secondary | ICD-10-CM | POA: Diagnosis not present

## 2017-01-27 DIAGNOSIS — R9439 Abnormal result of other cardiovascular function study: Secondary | ICD-10-CM | POA: Diagnosis not present

## 2017-01-27 DIAGNOSIS — R0602 Shortness of breath: Secondary | ICD-10-CM | POA: Diagnosis not present

## 2017-01-27 DIAGNOSIS — I480 Paroxysmal atrial fibrillation: Secondary | ICD-10-CM | POA: Diagnosis not present

## 2017-01-27 DIAGNOSIS — G43809 Other migraine, not intractable, without status migrainosus: Secondary | ICD-10-CM | POA: Diagnosis not present

## 2017-01-27 DIAGNOSIS — R079 Chest pain, unspecified: Secondary | ICD-10-CM | POA: Diagnosis not present

## 2017-01-27 DIAGNOSIS — R253 Fasciculation: Secondary | ICD-10-CM | POA: Diagnosis not present

## 2017-01-27 HISTORY — PX: LEFT HEART CATH AND CORONARY ANGIOGRAPHY: CATH118249

## 2017-01-27 LAB — PROTIME-INR
INR: 1.07
PROTHROMBIN TIME: 14 s (ref 11.4–15.2)

## 2017-01-27 LAB — BASIC METABOLIC PANEL
Anion gap: 8 (ref 5–15)
BUN: 11 mg/dL (ref 6–20)
CO2: 26 mmol/L (ref 22–32)
CREATININE: 0.91 mg/dL (ref 0.61–1.24)
Calcium: 9.1 mg/dL (ref 8.9–10.3)
Chloride: 107 mmol/L (ref 101–111)
GFR calc Af Amer: >60 mL/min (ref >60)
GLUCOSE: 114 mg/dL — AB (ref 65–99)
Potassium: 3.6 mmol/L (ref 3.5–5.1)
SODIUM: 141 mmol/L (ref 135–145)

## 2017-01-27 SURGERY — LEFT HEART CATH AND CORONARY ANGIOGRAPHY
Anesthesia: LOCAL

## 2017-01-27 MED ORDER — VERAPAMIL HCL 2.5 MG/ML IV SOLN
INTRAVENOUS | Status: AC
  Filled 2017-01-27: qty 2

## 2017-01-27 MED ORDER — SODIUM CHLORIDE 0.9 % IV SOLN: 250.0000 mL | INTRAVENOUS | Status: DC | PRN

## 2017-01-27 MED ORDER — LIDOCAINE HCL 1 % IJ SOLN
INTRAMUSCULAR | Status: AC
  Filled 2017-01-27: qty 20

## 2017-01-27 MED ORDER — HEPARIN (PORCINE) IN NACL 2-0.9 UNIT/ML-% IJ SOLN
INTRAMUSCULAR | Status: AC
  Filled 2017-01-27: qty 1000

## 2017-01-27 MED ORDER — SODIUM CHLORIDE 0.9% FLUSH: 3.0000 mL | INTRAVENOUS | Status: DC | PRN

## 2017-01-27 MED ORDER — FENTANYL CITRATE (PF) 100 MCG/2ML IJ SOLN
INTRAMUSCULAR | Status: DC | PRN
  Administered 2017-01-27: 50 ug via INTRAVENOUS

## 2017-01-27 MED ORDER — MIDAZOLAM HCL 2 MG/2ML IJ SOLN
INTRAMUSCULAR | Status: DC | PRN
  Administered 2017-01-27: 2 mg via INTRAVENOUS

## 2017-01-27 MED ORDER — IOPAMIDOL (ISOVUE-370) INJECTION 76%
INTRAVENOUS | Status: DC | PRN
  Administered 2017-01-27: 90 mL via INTRA_ARTERIAL

## 2017-01-27 MED ORDER — HEPARIN (PORCINE) IN NACL 2-0.9 UNIT/ML-% IJ SOLN
INTRAMUSCULAR | Status: AC | PRN
Start: 2017-01-27 — End: 2017-01-27
  Administered 2017-01-27: 1000 mL via INTRA_ARTERIAL

## 2017-01-27 MED ORDER — VERAPAMIL HCL 2.5 MG/ML IV SOLN
INTRAVENOUS | Status: DC | PRN
  Administered 2017-01-27: 10 mL via INTRA_ARTERIAL

## 2017-01-27 MED ORDER — MIDAZOLAM HCL 2 MG/2ML IJ SOLN
INTRAMUSCULAR | Status: AC
  Filled 2017-01-27: qty 2

## 2017-01-27 MED ORDER — SODIUM CHLORIDE 0.9% FLUSH
3.0000 mL | Freq: Two times a day (BID) | INTRAVENOUS | Status: DC
  Administered 2017-01-27: 3 mL via INTRAVENOUS

## 2017-01-27 MED ORDER — LIDOCAINE HCL (PF) 1 % IJ SOLN
INTRAMUSCULAR | Status: DC | PRN
Start: 2017-01-27 — End: 2017-01-27
  Administered 2017-01-27: 3 mL via INTRADERMAL

## 2017-01-27 MED ORDER — IOPAMIDOL (ISOVUE-370) INJECTION 76%
INTRAVENOUS | Status: AC
  Filled 2017-01-27: qty 100

## 2017-01-27 MED ORDER — HEPARIN SODIUM (PORCINE) 1000 UNIT/ML IJ SOLN
INTRAMUSCULAR | Status: AC
  Filled 2017-01-27: qty 1

## 2017-01-27 MED ORDER — ASPIRIN 81 MG PO CHEW
81.0000 mg | CHEWABLE_TABLET | ORAL | Status: AC
  Administered 2017-01-27: 81 mg via ORAL
  Filled 2017-01-27: qty 1

## 2017-01-27 MED ORDER — SODIUM CHLORIDE 0.9 % WEIGHT BASED INFUSION
3.0000 mL/kg/h | INTRAVENOUS | Status: AC
  Administered 2017-01-27: 3 mL/kg/h via INTRAVENOUS

## 2017-01-27 MED ORDER — FENTANYL CITRATE (PF) 100 MCG/2ML IJ SOLN
INTRAMUSCULAR | Status: AC
  Filled 2017-01-27: qty 2

## 2017-01-27 MED ORDER — SODIUM CHLORIDE 0.9 % WEIGHT BASED INFUSION
1.0000 mL/kg/h | INTRAVENOUS | Status: DC
Start: 2017-01-27 — End: 2017-01-27
  Administered 2017-01-27: 1 mL/kg/h via INTRAVENOUS

## 2017-01-27 MED ORDER — POTASSIUM CHLORIDE CRYS ER 20 MEQ PO TBCR
40.0000 meq | EXTENDED_RELEASE_TABLET | Freq: Once | ORAL | Status: AC
  Administered 2017-01-27: 40 meq via ORAL
  Filled 2017-01-27: qty 2

## 2017-01-27 MED ORDER — HEPARIN SODIUM (PORCINE) 1000 UNIT/ML IJ SOLN
INTRAMUSCULAR | Status: DC | PRN
  Administered 2017-01-27: 6500 [IU] via INTRAVENOUS

## 2017-01-27 SURGICAL SUPPLY — 13 items
CATH INFINITI 5 FR JL3.5 (CATHETERS) ×2 IMPLANT
CATH INFINITI 5FR ANG PIGTAIL (CATHETERS) ×2 IMPLANT
CATH OPTITORQUE TIG 4.0 5F (CATHETERS) ×2 IMPLANT
DEVICE RAD COMP TR BAND LRG (VASCULAR PRODUCTS) ×2 IMPLANT
GLIDESHEATH SLEND SS 6F .021 (SHEATH) ×2 IMPLANT
GUIDEWIRE INQWIRE 1.5J.035X260 (WIRE) ×1 IMPLANT
HOVERMATT SINGLE USE (MISCELLANEOUS) ×2 IMPLANT
INQWIRE 1.5J .035X260CM (WIRE) ×2
KIT HEART LEFT (KITS) ×2 IMPLANT
PACK CARDIAC CATHETERIZATION (CUSTOM PROCEDURE TRAY) ×2 IMPLANT
SYR MEDRAD MARK V 150ML (SYRINGE) ×2 IMPLANT
TRANSDUCER W/STOPCOCK (MISCELLANEOUS) ×2 IMPLANT
TUBING CIL FLEX 10 FLL-RA (TUBING) ×2 IMPLANT

## 2017-01-27 NOTE — Progress Notes (Signed)
Progress Note  Patient Name: [name] Date of Encounter: 01/27/2017  Primary Cardiologist: Dr. Acie Fredrickson  Subjective   Patient is feeling well; denies chest pain, SOB, and palpitations. He is NPO for heart cath today.  Inpatient Medications    Scheduled Meds: . aspirin EC  81 mg Oral Daily  . atorvastatin  20 mg Oral q1800  . dofetilide  500 mcg Oral BID  . loratadine  10 mg Oral Daily  . pantoprazole  20 mg Oral Daily  . polyethylene glycol  17 g Oral Daily  . sodium chloride flush  3 mL Intravenous Q12H   Continuous Infusions: . sodium chloride    . sodium chloride 1 mL/kg/hr (01/27/17 0730)   PRN Meds: sodium chloride, acetaminophen, fluticasone, gi cocktail, ondansetron (ZOFRAN) IV, sodium chloride flush   Vital Signs    Vitals:   01/25/17 2059 01/26/17 0510 01/26/17 2028 01/27/17 0536  BP: 135/83 111/67 126/90 110/62  Pulse: 63 64 65 65  Resp: 18 19 20 19   Temp: 98.8 F (37.1 C) 97.7 F (36.5 C) 98.7 F (37.1 C) 98.6 F (37 C)  TempSrc:      SpO2: 96% 98% 98% 98%  Weight:  298 lb 11.2 oz (135.5 kg)  299 lb 4.8 oz (135.8 kg)  Height:        Intake/Output Summary (Last 24 hours) at 01/27/17 0830 Last data filed at 01/27/17 0537  Gross per 24 hour  Intake                0 ml  Output                0 ml  Net                0 ml   Filed Weights   01/25/17 0615 01/26/17 0510 01/27/17 0536  Weight: (!) 304 lb 12.8 oz (138.3 kg) 298 lb 11.2 oz (135.5 kg) 299 lb 4.8 oz (135.8 kg)     Physical Exam   General: Well developed, well nourished, male appearing in no acute distress. Head: Normocephalic, atraumatic.  Neck: Supple without bruits, no JVD Lungs:  Resp regular and unlabored, CTA. Heart: RRR, S1, S2, no S3, S4, or murmur; no rub. Abdomen: Soft, non-tender, non-distended with normoactive bowel sounds. No hepatomegaly. No rebound/guarding. No obvious abdominal masses. Extremities: No clubbing, cyanosis, no edema. Distal pedal pulses are 1+  bilaterally. Neuro: Alert and oriented X 3. Moves all extremities spontaneously. Psych: Normal affect.  Labs    Chemistry Recent Labs Lab 01/25/17 0245 01/26/17 0255 01/27/17 0407  NA 141 141 141  K 3.8 4.4 3.6  CL 106 107 107  CO2 27 28 26   GLUCOSE 99 94 114*  BUN 11 13 11   CREATININE 0.93 0.94 0.91  CALCIUM 9.1 9.2 9.1  GFRNONAA >60 >60 >60  GFRAA >60 >60 >60  ANIONGAP 8 6 8      Hematology Recent Labs Lab 01/23/17 0444 01/24/17 0315 01/25/17 0245  WBC 4.5 6.8 8.3  RBC 4.06* 4.95 4.88  HGB 10.8* 13.2 13.0  HCT 33.7* 41.5 40.4  MCV 83.0 83.8 82.8  MCH 26.6 26.7 26.6  MCHC 32.0 31.8 32.2  RDW 13.4 13.6 13.3  PLT 144* 224 206    Cardiac Enzymes Recent Labs Lab 01/22/17 1659 01/22/17 1928 01/22/17 2320  TROPONINI <0.03 <0.03 <0.03    Recent Labs Lab 01/22/17 1116  TROPIPOC 0.00     BNPNo results for input(s): BNP, PROBNP in the last 168  hours.   DDimer No results for input(s): DDIMER in the last 168 hours.   Radiology    No results found.   Telemetry    NSR - Personally Reviewed  ECG    No new tracings - Personally Reviewed   Cardiac Studies   Nuclear stress test 01/24/17 IMPRESSION: 1. Large area of mild reversibility is identified involving the apex and lateral wall. This has a summed difference score equal to 6. 2. Normal left ventricular wall motion. 3. Left ventricular ejection fraction 52% 4. Non invasive risk stratification*  2D ECHO: 01/23/2017 LV EF: 60% - 65% Study Conclusions - Left ventricle: The cavity size was mildly dilated. There was moderate focal basal hypertrophy. Systolic function was normal. The estimated ejection fraction was in the range of 60% to 65%. Wall motion was normal; there were no regional wall motion abnormalities. Features are consistent with a pseudonormal left ventricular filling pattern, with concomitant abnormal relaxation and increased filling pressure (grade 2 diastolic  dysfunction). Doppler parameters are consistent with high ventricular filling pressure. - Aorta: Ascending aorta diameter: 38 mm (ED). - Ascending aorta: The ascending aorta was mildly dilated. - Mitral valve: Valve area by pressure half-time: 2.27 cm^2. - Right ventricle: The cavity size was severely dilated. Wall thickness was normal.  Patient Profile     51 y.o. male with a history of obesity, PAF on TIkosyn and Eliquis, OSA on CPAP, HTN, HLD, GERD who presented to [facility] ED yesterday with left-sided sharp/pressure type chest pain associated with shortness of breath, lightheadedness and left arm numbness  Assessment & Plan    1. Chest pain - he has ruled out for MI with negative cardiac enzymes and EKG without signs of ischemia. He had an abnormal myoview test with large area of mild reversibility in the apex and lateral wall. This was read as a high risk study. He has been scheduled for left heart catheterization today with Dr. Claiborne Billings.  - He denies chest pain overnight and questions musculoskeletal causes of his chest pain   2.  Paroxysmal Afib - eliquis on  Hold - continue tikosyn  - telemetry with NSR   3. OSA on BiPAP   4. GERD - continue PPI   Signed, Ledora Bottcher , PA-C 8:30 AM 01/27/2017 Pager: [phone]   Attending Note:   The patient was seen and examined.  Agree with assessment and plan as noted above.  Changes made to the above note as needed.  Patient seen and independently examined with Doreene Adas, PA .   We discussed all aspects of the encounter. I agree with the assessment and plan as stated above.  1.   Chest pain :  Somewhat atypical  myoview shows a reversible apical defect. For cath today  All questions answered.  2. Atrial fib:  Paroxysmal -  Continue tikosyn Eliquis is currently on hold    I have spent a total of 40 minutes with patient reviewing hospital  notes , telemetry, EKGs, labs and examining patient as well as  establishing an assessment and plan that was discussed with the patient. > 50% of time was spent in direct patient care.    Thayer Headings, Brooke Bonito., MD, [facility] 01/27/2017, 9:23 AM [address],  [place] Pager [phone]

## 2017-01-27 NOTE — Interval H&P Note (Signed)
Cath Lab Visit (complete for each Cath Lab visit)  Clinical Evaluation Leading to the Procedure:   ACS: No.  Non-ACS:    Anginal Classification: CCS III  Anti-ischemic medical therapy: No Therapy  Non-Invasive Test Results: High-risk stress test findings: cardiac mortality >3%/year  Prior CABG: No previous CABG      History and Physical Interval Note:  01/27/2017 5:06 PM  [name]  has presented today for surgery, with the diagnosis of abnormal stress  The various methods of treatment have been discussed with the patient and family. After consideration of risks, benefits and other options for treatment, the patient has consented to  Procedure(s): Left Heart Cath and Coronary Angiography (N/A) as a surgical intervention .  The patient's history has been reviewed, patient examined, no change in status, stable for surgery.  I have reviewed the patient's chart and labs.  Questions were answered to the patient's satisfaction.     Shelva Majestic

## 2017-01-27 NOTE — Progress Notes (Signed)
Family Medicine Teaching Service Daily Progress Note Intern Pager: [phone]  Patient name: [name] Medical record number: [id number] Date of birth: January 22, 1966 Age: 51 y.o. Gender: male  Primary Care Provider: [name], MD Consultants: Neurology (signed off), Cardiology Code Status: full  Pt Overview and Major Events to Date:  6/20 admitted for chest pain  Assessment and Plan: [name] is a 51 y.o. male presenting with chest pain. PMH is significant for afib on eliquis, OSA, chronic back pain, HTN, HLD, migraines, obesity, and GERD.  Chest pain: ACS ruled out with negative troponins and no acute EKG findings. Exercise nuclear stress test high risk with large area of mild reversibility. Risk stratification labs: LDL 108, TSH 1.233, A1c 5.6. Continues to have intermittent twinges of chest pain that associated with back pain and positional changes - monitor on telemetry - vitals per floor - continue ASA 19 - cardiology consulted, appreciate their recommendations  -Holding Eliquis for cardiac cath today  ~1:30pm  Headache with L temporal hemifacial spam, resolved. Likely due to symptomatic migraine.  - Per neurology, treat migraine. Signed off - Continue to monitor   Afib- on eliquis and tikosyn, currently in NSR. HR  In 60s - tele - Holding eliquis for cath - Continue Tikosyn  - monitor K, will keep >4  OSA -uses CPAP at night, continue in hospital  HLD.   ASCVD 17yr risk 6.9%  - atorvastatin 20mg  qd  Allergies Per patient, at home on zyrtec daily and flonase as needed  - Claritin 10mg  qd and flonase prn  FEN/GI: heart healthy diet, PPI Prophylaxis: holding eliquis for cath   Disposition: pending cardiac cath today 6/25  Subjective:  No acute events overnight. Patient states that he feels well. Had a couple twinges of chest pain this morning along with back pain that is when he is laying flat on his back but resolves with positional changes, no shortness  of breath. Denies any headaches.  Objective: Temp:  [98.6 F (37 C)-98.7 F (37.1 C)] 98.6 F (37 C) (06/25 0536) Pulse Rate:  [65] 65 (06/25 0536) Resp:  [19-20] 19 (06/25 0536) BP: (110-126)/(62-90) 110/62 (06/25 0536) SpO2:  [98 %] 98 % (06/25 0536) Weight:  [299 lb 4.8 oz (135.8 kg)] 299 lb 4.8 oz (135.8 kg) (06/25 0536) Physical Exam: General: Sitting up in chair, in NAD Cardiovascular: RRR, no murmurs Respiratory: CTAB, normal effort on room air Abdomen: soft, nontender, nondistended, + bowel sounds Extremities: no LE edema  Laboratory:  Recent Labs Lab 01/23/17 0444 01/24/17 0315 01/25/17 0245  WBC 4.5 6.8 8.3  HGB 10.8* 13.2 13.0  HCT 33.7* 41.5 40.4  PLT 144* 224 206    Recent Labs Lab 01/25/17 0245 01/26/17 0255 01/27/17 0407  NA 141 141 141  K 3.8 4.4 3.6  CL 106 107 107  CO2 27 28 26   BUN 11 13 11   CREATININE 0.93 0.94 0.91  CALCIUM 9.1 9.2 9.1  GLUCOSE 99 94 114*   Lipid Panel     Component Value Date/Time   CHOL 179 01/22/2017 2320   TRIG 230 (H) 01/22/2017 2320   HDL 25 (L) 01/22/2017 2320   CHOLHDL 7.2 01/22/2017 2320   VLDL 46 (H) 01/22/2017 2320   LDLCALC 108 (H) 01/22/2017 2320     Imaging/Diagnostic Tests: No results found.  Bufford Lope, DO 01/27/2017, 7:07 AM PGY-1, [place] Intern pager: [phone], text pages welcome

## 2017-01-27 NOTE — H&P (View-Only) (Signed)
Progress Note  Patient Name: [name] Date of Encounter: 01/27/2017  Primary Cardiologist: Dr. Acie Fredrickson  Subjective   Patient is feeling well; denies chest pain, SOB, and palpitations. He is NPO for heart cath today.  Inpatient Medications    Scheduled Meds: . aspirin EC  81 mg Oral Daily  . atorvastatin  20 mg Oral q1800  . dofetilide  500 mcg Oral BID  . loratadine  10 mg Oral Daily  . pantoprazole  20 mg Oral Daily  . polyethylene glycol  17 g Oral Daily  . sodium chloride flush  3 mL Intravenous Q12H   Continuous Infusions: . sodium chloride    . sodium chloride 1 mL/kg/hr (01/27/17 0730)   PRN Meds: sodium chloride, acetaminophen, fluticasone, gi cocktail, ondansetron (ZOFRAN) IV, sodium chloride flush   Vital Signs    Vitals:   01/25/17 2059 01/26/17 0510 01/26/17 2028 01/27/17 0536  BP: 135/83 111/67 126/90 110/62  Pulse: 63 64 65 65  Resp: 18 19 20 19   Temp: 98.8 F (37.1 C) 97.7 F (36.5 C) 98.7 F (37.1 C) 98.6 F (37 C)  TempSrc:      SpO2: 96% 98% 98% 98%  Weight:  298 lb 11.2 oz (135.5 kg)  299 lb 4.8 oz (135.8 kg)  Height:        Intake/Output Summary (Last 24 hours) at 01/27/17 0830 Last data filed at 01/27/17 0537  Gross per 24 hour  Intake                0 ml  Output                0 ml  Net                0 ml   Filed Weights   01/25/17 0615 01/26/17 0510 01/27/17 0536  Weight: (!) 304 lb 12.8 oz (138.3 kg) 298 lb 11.2 oz (135.5 kg) 299 lb 4.8 oz (135.8 kg)     Physical Exam   General: Well developed, well nourished, male appearing in no acute distress. Head: Normocephalic, atraumatic.  Neck: Supple without bruits, no JVD Lungs:  Resp regular and unlabored, CTA. Heart: RRR, S1, S2, no S3, S4, or murmur; no rub. Abdomen: Soft, non-tender, non-distended with normoactive bowel sounds. No hepatomegaly. No rebound/guarding. No obvious abdominal masses. Extremities: No clubbing, cyanosis, no edema. Distal pedal pulses are 1+  bilaterally. Neuro: Alert and oriented X 3. Moves all extremities spontaneously. Psych: Normal affect.  Labs    Chemistry Recent Labs Lab 01/25/17 0245 01/26/17 0255 01/27/17 0407  NA 141 141 141  K 3.8 4.4 3.6  CL 106 107 107  CO2 27 28 26   GLUCOSE 99 94 114*  BUN 11 13 11   CREATININE 0.93 0.94 0.91  CALCIUM 9.1 9.2 9.1  GFRNONAA >60 >60 >60  GFRAA >60 >60 >60  ANIONGAP 8 6 8      Hematology Recent Labs Lab 01/23/17 0444 01/24/17 0315 01/25/17 0245  WBC 4.5 6.8 8.3  RBC 4.06* 4.95 4.88  HGB 10.8* 13.2 13.0  HCT 33.7* 41.5 40.4  MCV 83.0 83.8 82.8  MCH 26.6 26.7 26.6  MCHC 32.0 31.8 32.2  RDW 13.4 13.6 13.3  PLT 144* 224 206    Cardiac Enzymes Recent Labs Lab 01/22/17 1659 01/22/17 1928 01/22/17 2320  TROPONINI <0.03 <0.03 <0.03    Recent Labs Lab 01/22/17 1116  TROPIPOC 0.00     BNPNo results for input(s): BNP, PROBNP in the last 168  hours.   DDimer No results for input(s): DDIMER in the last 168 hours.   Radiology    No results found.   Telemetry    NSR - Personally Reviewed  ECG    No new tracings - Personally Reviewed   Cardiac Studies   Nuclear stress test 01/24/17 IMPRESSION: 1. Large area of mild reversibility is identified involving the apex and lateral wall. This has a summed difference score equal to 6. 2. Normal left ventricular wall motion. 3. Left ventricular ejection fraction 52% 4. Non invasive risk stratification*  2D ECHO: 01/23/2017 LV EF: 60% - 65% Study Conclusions - Left ventricle: The cavity size was mildly dilated. There was moderate focal basal hypertrophy. Systolic function was normal. The estimated ejection fraction was in the range of 60% to 65%. Wall motion was normal; there were no regional wall motion abnormalities. Features are consistent with a pseudonormal left ventricular filling pattern, with concomitant abnormal relaxation and increased filling pressure (grade 2 diastolic  dysfunction). Doppler parameters are consistent with high ventricular filling pressure. - Aorta: Ascending aorta diameter: 38 mm (ED). - Ascending aorta: The ascending aorta was mildly dilated. - Mitral valve: Valve area by pressure half-time: 2.27 cm^2. - Right ventricle: The cavity size was severely dilated. Wall thickness was normal.  Patient Profile     51 y.o. male with a history of obesity, PAF on TIkosyn and Eliquis, OSA on CPAP, HTN, HLD, GERD who presented to [facility] ED yesterday with left-sided sharp/pressure type chest pain associated with shortness of breath, lightheadedness and left arm numbness  Assessment & Plan    1. Chest pain - he has ruled out for MI with negative cardiac enzymes and EKG without signs of ischemia. He had an abnormal myoview test with large area of mild reversibility in the apex and lateral wall. This was read as a high risk study. He has been scheduled for left heart catheterization today with Dr. Claiborne Billings.  - He denies chest pain overnight and questions musculoskeletal causes of his chest pain   2.  Paroxysmal Afib - eliquis on  Hold - continue tikosyn  - telemetry with NSR   3. OSA on BiPAP   4. GERD - continue PPI   Signed, Ledora Bottcher , PA-C 8:30 AM 01/27/2017 Pager: [phone]   Attending Note:   The patient was seen and examined.  Agree with assessment and plan as noted above.  Changes made to the above note as needed.  Patient seen and independently examined with Doreene Adas, PA .   We discussed all aspects of the encounter. I agree with the assessment and plan as stated above.  1.   Chest pain :  Somewhat atypical  myoview shows a reversible apical defect. For cath today  All questions answered.  2. Atrial fib:  Paroxysmal -  Continue tikosyn Eliquis is currently on hold    I have spent a total of 40 minutes with patient reviewing hospital  notes , telemetry, EKGs, labs and examining patient as well as  establishing an assessment and plan that was discussed with the patient. > 50% of time was spent in direct patient care.    Thayer Headings, Brooke Bonito., MD, [facility] 01/27/2017, 9:23 AM [address],  [place] Pager [phone]

## 2017-01-28 ENCOUNTER — Encounter (HOSPITAL_COMMUNITY): Payer: Self-pay | Admitting: Cardiovascular Disease

## 2017-01-28 DIAGNOSIS — R079 Chest pain, unspecified: Secondary | ICD-10-CM | POA: Diagnosis not present

## 2017-01-28 DIAGNOSIS — R9439 Abnormal result of other cardiovascular function study: Secondary | ICD-10-CM | POA: Diagnosis not present

## 2017-01-28 DIAGNOSIS — G43809 Other migraine, not intractable, without status migrainosus: Secondary | ICD-10-CM | POA: Diagnosis not present

## 2017-01-28 DIAGNOSIS — I480 Paroxysmal atrial fibrillation: Secondary | ICD-10-CM | POA: Diagnosis not present

## 2017-01-28 DIAGNOSIS — R253 Fasciculation: Secondary | ICD-10-CM | POA: Diagnosis not present

## 2017-01-28 LAB — BASIC METABOLIC PANEL
Anion gap: 7 (ref 5–15)
BUN: 10 mg/dL (ref 6–20)
CALCIUM: 9.1 mg/dL (ref 8.9–10.3)
CHLORIDE: 106 mmol/L (ref 101–111)
CO2: 27 mmol/L (ref 22–32)
CREATININE: 0.93 mg/dL (ref 0.61–1.24)
Glucose, Bld: 112 mg/dL — ABNORMAL HIGH (ref 65–99)
Potassium: 4.3 mmol/L (ref 3.5–5.1)
SODIUM: 140 mmol/L (ref 135–145)

## 2017-01-28 LAB — CBC
HCT: 40 % (ref 39.0–52.0)
Hemoglobin: 12.8 g/dL — ABNORMAL LOW (ref 13.0–17.0)
MCH: 26.4 pg (ref 26.0–34.0)
MCHC: 32 g/dL (ref 30.0–36.0)
MCV: 82.5 fL (ref 78.0–100.0)
PLATELETS: 197 10*3/uL (ref 150–400)
RBC: 4.85 MIL/uL (ref 4.22–5.81)
RDW: 13.4 % (ref 11.5–15.5)
WBC: 7.5 10*3/uL (ref 4.0–10.5)

## 2017-01-28 MED ORDER — ATORVASTATIN CALCIUM 20 MG PO TABS: 20.0000 mg | ORAL_TABLET | Freq: Every day | ORAL | 0 refills | Status: DC

## 2017-01-28 MED ORDER — ASPIRIN 81 MG PO TBEC: 81.0000 mg | DELAYED_RELEASE_TABLET | Freq: Every day | ORAL | 0 refills | Status: DC

## 2017-01-28 MED ORDER — LORATADINE 10 MG PO TABS: 10.0000 mg | ORAL_TABLET | Freq: Every day | ORAL | 0 refills | Status: DC

## 2017-01-28 NOTE — Progress Notes (Signed)
Progress Note  Patient Name: [name] Date of Encounter: 01/28/2017  Primary Cardiologist: Dr. Acie Fredrickson  Subjective   Patient is feeling well; denies SOB and palpitations. He gets chest pain when he leans on something, likely MSK. He is concerned about his bradycardia.  Inpatient Medications    Scheduled Meds: . aspirin EC  81 mg Oral Daily  . atorvastatin  20 mg Oral q1800  . dofetilide  500 mcg Oral BID  . loratadine  10 mg Oral Daily  . pantoprazole  20 mg Oral Daily  . polyethylene glycol  17 g Oral Daily   Continuous Infusions:  PRN Meds: acetaminophen, fluticasone, gi cocktail, ondansetron (ZOFRAN) IV   Vital Signs    Vitals:   01/27/17 1747 01/27/17 1752 01/27/17 1937 01/28/17 0500  BP: 137/89 138/86 132/88 126/77  Pulse: (!) 56 63 67 61  Resp: 17 15 17 18   Temp:    97.9 F (36.6 C)  TempSrc:    Oral  SpO2: 100% 98% 96% 100%  Weight:    299 lb 6.4 oz (135.8 kg)  Height:        Intake/Output Summary (Last 24 hours) at 01/28/17 0755 Last data filed at 01/27/17 1900  Gross per 24 hour  Intake           714.25 ml  Output              550 ml  Net           164.25 ml   Filed Weights   01/26/17 0510 01/27/17 0536 01/28/17 0500  Weight: 298 lb 11.2 oz (135.5 kg) 299 lb 4.8 oz (135.8 kg) 299 lb 6.4 oz (135.8 kg)     Physical Exam   General: Well developed, well nourished, male appearing in no acute distress. Head: Normocephalic, atraumatic.  Neck: Supple without bruits, - JVD Lungs:  Resp regular and unlabored, CTA. Heart: RRR, S1, S2, no S3, S4, or murmur; no rub. Abdomen: Soft, non-tender, non-distended with normoactive bowel sounds. No hepatomegaly. No rebound/guarding. No obvious abdominal masses. Extremities: No clubbing, cyanosis, no edema. Distal pedal pulses are 2+ bilaterally. Neuro: Alert and oriented X 3. Moves all extremities spontaneously. Psych: Normal affect.  Labs    Chemistry Recent Labs Lab 01/26/17 0255 01/27/17 0407  01/28/17 0426  NA 141 141 140  K 4.4 3.6 4.3  CL 107 107 106  CO2 28 26 27   GLUCOSE 94 114* 112*  BUN 13 11 10   CREATININE 0.94 0.91 0.93  CALCIUM 9.2 9.1 9.1  GFRNONAA >60 >60 >60  GFRAA >60 >60 >60  ANIONGAP 6 8 7      Hematology Recent Labs Lab 01/24/17 0315 01/25/17 0245 01/28/17 0426  WBC 6.8 8.3 7.5  RBC 4.95 4.88 4.85  HGB 13.2 13.0 12.8*  HCT 41.5 40.4 40.0  MCV 83.8 82.8 82.5  MCH 26.7 26.6 26.4  MCHC 31.8 32.2 32.0  RDW 13.6 13.3 13.4  PLT 224 206 197    Cardiac Enzymes Recent Labs Lab 01/22/17 1659 01/22/17 1928 01/22/17 2320  TROPONINI <0.03 <0.03 <0.03    Recent Labs Lab 01/22/17 1116  TROPIPOC 0.00     BNPNo results for input(s): BNP, PROBNP in the last 168 hours.   DDimer No results for input(s): DDIMER in the last 168 hours.   Radiology    No results found.   Telemetry    Sinus rhythm, bouts of bradycardia in th e40s - Personally Reviewed  ECG    No new  tracings - Personally Reviewed   Cardiac Studies   Left heart catheterization 01/27/17:  The left ventricular ejection fraction is 55-65% by visual estimate.  LV end diastolic pressure is normal.  The left ventricular systolic function is normal.   Normal LV function without focal segmental wall motion abnormalities with an ejection fraction estimated at 60%.  Normal coronary arteries.  RECOMMENDATION: Medical therapy for the patient's PAF with resumption of anticoagulation therapy.  False positive nuclear perfusion study most likely due to the patient's body habitus and marked diaphragmatic attenuation.   Nuclear stress test 01/24/17 IMPRESSION: 1. Large area of mild reversibility is identified involving the apex and lateral wall. This has a summed difference score equal to 6. 2. Normal left ventricular wall motion. 3. Left ventricular ejection fraction 52% 4. Non invasive risk stratification*  2D ECHO: 01/23/2017 LV EF: 60% - 65% Study Conclusions - Left  ventricle: The cavity size was mildly dilated. There was moderate focal basal hypertrophy. Systolic function was normal. The estimated ejection fraction was in the range of 60% to 65%. Wall motion was normal; there were no regional wall motion abnormalities. Features are consistent with a pseudonormal left ventricular filling pattern, with concomitant abnormal relaxation and increased filling pressure (grade 2 diastolic dysfunction). Doppler parameters are consistent with high ventricular filling pressure. - Aorta: Ascending aorta diameter: 38 mm (ED). - Ascending aorta: The ascending aorta was mildly dilated. - Mitral valve: Valve area by pressure half-time: 2.27 cm^2. - Right ventricle: The cavity size was severely dilated. Wall thickness was normal.  Patient Profile     51 y.o. male with a history of obesity, PAF on TIkosyn and Eliquis, OSA on CPAP, HTN, HLD, GERD who presented to [facility] ED yesterday with left-sided sharp/pressure type chest pain associated with shortness of breath, lightheadedness and left arm numbness  Assessment & Plan    1. Chest pain, bradycardia - left heart cath yesterday negative for coronary artery disease - myoview was false positive: body habitus vs diaphragm attenuation - patient is concerned with bradycardia in the 40s. Telemetry reviews without evidence of heart block. Per the patient, he is generally resting during these episodes and is not symptomatic. Given his lipid profile and risk factors for CAD, would start 40 mg lipitor at discharge.   2. Paroxysmal Afib - restart eliquis today - continue tikosyn - telemetry with NSR   3. OSA on BiPAP   4. GERD - continue PPI    Signed, Courtney Paris 7:55 AM 01/28/2017 Pager: [phone]   Attending Note:   The patient was seen and examined.  Agree with assessment and plan as noted above.  Changes made to the above note as needed.  Patient seen and independently  examined with Doreene Adas, PA .   We discussed all aspects of the encounter. I agree with the assessment and plan as stated above.  1.  PAF.   Restart Eliquis. Continue Tikosyn  2. Chest pain:  Non cardiac  He will need a work excuse for 1 week ( he lifts heavy boxes - up to 100 lbs at work )  May return to work on Tuesday , JUly 3.    I have spent a total of 40 minutes with patient reviewing hospital  notes , telemetry, EKGs, labs and examining patient as well as establishing an assessment and plan that was discussed with the patient. > 50% of time was spent in direct patient care.    Ramond Dial., MD,  [facility] 01/28/2017, 9:28 AM [address],  [place] Pager [phone]

## 2017-01-28 NOTE — Progress Notes (Signed)
Transitions of Care Pharmacy Note  Plan:  Educated on new prescriptions for atorvastatin, aspirin Recommended avoiding Excedrin at home --------------------------------------------- [name] is an 51 y.o. male who presents with a chief complaint of CP. In anticipation of discharge, pharmacy has reviewed this patient's prior to admission medication history, as well as current inpatient medications listed per the [facility].  Current medication indications, dosing, frequency, and notable side effects reviewed with patient. patient verbalized understanding of current inpatient medication regimen and is aware that the After Visit Summary when presented, will represent the most accurate medication list at discharge.   Assessment: Understanding of regimen: good Understanding of indications: good Potential of compliance: good Barriers to Obtaining Medications: No  Patient instructed to contact inpatient pharmacy team with further questions or concerns if needed.    Time spent preparing for discharge counseling: 10 Time spent counseling patient: 10   Thank you for allowing pharmacy to be a part of this patient's care.  Arrie Senate, PharmD PGY-1 Pharmacy Resident Pager: [phone] 01/28/2017

## 2017-01-28 NOTE — Progress Notes (Signed)
The patient was given discharge instructions along with a new medication list and what to take today. He has follow up appointments set and prescriptions to pick up. He is discharging with his wife via car.   Saddie Benders RN

## 2017-01-29 ENCOUNTER — Inpatient Hospital Stay: Payer: 59 | Admitting: Family Medicine

## 2017-01-31 ENCOUNTER — Telehealth: Payer: Self-pay | Admitting: Family Medicine

## 2017-01-31 NOTE — Telephone Encounter (Signed)
Pt is calling about his Gout medication, He was getting Colchine but his insurance no longer covers this. Can we call in something that his insurance will cover. jw

## 2017-01-31 NOTE — Assessment & Plan Note (Deleted)
Very bad

## 2017-01-31 NOTE — Progress Notes (Signed)
    Subjective:  [name] is a 52 y.o. male with history of OSA, atrial fibrillation, hypertension, and obesity who presents to the [facility] today with a chief complaint of recent hospitalization of chest pain and headache associated with L temporal hemifacial spasm, eye pain, and blurred vision.   HPI:  Chest pain Recent hospitalization with chest pain. Negative troponins 3. Patient had abnormal nuclear stress test, but negative cardiac catheterization.   Normal echo with EF of 60%. Elevated ASCVD of 6.9%. Patient was started on atorvastatin 20 mg.  Patient continued on anticoagulant therapy of aspirin and eliquis 5 mg by mouth daily.  ROS: Has chest pain resolved? Y. A muscle pain or aches since starting atorvastatin?N.  Any indigestion? N. Or any other bowel changes? N. Any feelings of anxiety or change in mood or recent stressors in life? N. Any trouble breathing? N. Any recent viral or respiratory illness?N.  Any cough/congestion? N.   Headache Headache associated with L temporal hemifacial spasm. Patient had negative MRI. Symptoms resolved in hospital with single dose of Toradol. Reduced caffiene.  ROS: Any recurrence of symptoms? N. Any facial paralysis, numbness, change in vision? N. Use any recent illnesses viral or otherwise? N. Any recent bites by a tick? N. Any fevers or chills? N. Any nausea or vomiting? N.   Gout 2 day of left foot pain. H/o of gout. Says pain is similar to last time. No recent Histories of falls. Patient says he ate a lot of [place] in the hospital. This has caused flair before. She has Ibuprofen 2-3 day. Says insurance no longer covers colchicine. Indomethacin has worked for the past. Denies recent falls/calf injury or calf pain.   ROS: Per HPI  PMH: Smoking history reviewed.   Objective:  Physical Exam: BP 116/70   Pulse (!) 58   Temp 98 F (36.7 C) (Oral)   Ht 6\' 2"  (1.88 m)   Wt (!) 307 lb (139.3 kg)   BMI 39.42 kg/m   Gen: NAD, resting  comfortably CV: RRR with no murmurs appreciated HEENT: No tenderness on temple palpation, moist mucous membranes, tympanic membranes clear and pearly, Pulm: NWOB, CTAB with no crackles, wheezes, or rhonchi GI: Normal bowel sounds present. Soft, Nontender, Nondistended. MSK: left ankle edema. No redness. Mildly warm to touch. 2+dp.  Skin: warm, dry Neuro: CN II-XII in tact.  Psych: Normal affect and thought content  No results found for this or any previous visit (from the past 72 hour(s)).   Assessment/Plan:  Chest pain Pt patient presented for follow-up for recent hospitalization of chestpain. Negative troponins 3 in hospital. negative echo with EF of 60-65%. Elevated ASCVD risk so patient was started on atorvastan 20mg . patient denies any myalgias while on statin  Acute nonintractable headache Patient was rinsing headache associated with temporal muscle spasm on recent hospital visit. It resolves after 1 treatment of patient had of Toradol for pain the while in hospital. no other neurological deficits or other findings that were pertinent were found on today's examegative MRI.   Gout Patient's currently experiencing an acute gout attack of the left ankle.indomethacin has worked in the past. Gave patient a  three-day trial of indomethacin 75 mg daily   Marny Lowenstein, MD, [place] - PGY1 02/03/2017 11:17 AM

## 2017-02-03 ENCOUNTER — Telehealth: Payer: Self-pay | Admitting: Family Medicine

## 2017-02-03 ENCOUNTER — Ambulatory Visit (HOSPITAL_COMMUNITY): Payer: 59

## 2017-02-03 ENCOUNTER — Encounter: Payer: Self-pay | Admitting: Family Medicine

## 2017-02-03 ENCOUNTER — Ambulatory Visit (INDEPENDENT_AMBULATORY_CARE_PROVIDER_SITE_OTHER): Payer: 59 | Admitting: Family Medicine

## 2017-02-03 VITALS — BP 116/70 | HR 58 | Temp 98.0°F | Ht 74.0 in | Wt 307.0 lb

## 2017-02-03 DIAGNOSIS — M109 Gout, unspecified: Secondary | ICD-10-CM | POA: Diagnosis not present

## 2017-02-03 DIAGNOSIS — R079 Chest pain, unspecified: Secondary | ICD-10-CM

## 2017-02-03 DIAGNOSIS — R51 Headache: Secondary | ICD-10-CM

## 2017-02-03 DIAGNOSIS — R519 Headache, unspecified: Secondary | ICD-10-CM

## 2017-02-03 DIAGNOSIS — I480 Paroxysmal atrial fibrillation: Secondary | ICD-10-CM | POA: Diagnosis not present

## 2017-02-03 HISTORY — DX: Headache, unspecified: R51.9

## 2017-02-03 MED ORDER — INDOMETHACIN ER 75 MG PO CPCR: 75.0000 mg | ORAL_CAPSULE | Freq: Every day | ORAL | Status: AC

## 2017-02-03 NOTE — Assessment & Plan Note (Signed)
Pt patient presented for follow-up for recent hospitalization of chestpain. Negative troponins 3 in hospital. negative echo with EF of 60-65%. Elevated ASCVD risk so patient was started on atorvastan 20mg . patient denies any myalgias while on statin

## 2017-02-03 NOTE — Assessment & Plan Note (Signed)
Patient was rinsing headache associated with temporal muscle spasm on recent hospital visit. It resolves after 1 treatment of patient had of Toradol for pain the while in hospital. no other neurological deficits or other findings that were pertinent were found on today's examegative MRI.

## 2017-02-03 NOTE — Telephone Encounter (Signed)
Pt states indomethacin was never called in. Pt uses [facility] on [address]. ep

## 2017-02-03 NOTE — Patient Instructions (Signed)
Take indomethacin once a day for 3 days as needed for gout pain in the ankle return if pain continues  Get serum uric acid levels to check current gout status  Return to clinic if of gout increases or you experience any of these physical symptoms chest pain shortness of breath nausea vomiting

## 2017-02-03 NOTE — Telephone Encounter (Signed)
Medication called into pharmacy and they will contact patient when it is ready for pick up. Lyon Dumont,CMA

## 2017-02-03 NOTE — Assessment & Plan Note (Addendum)
Patient's currently experiencing an acute gout attack of the left ankle.indomethacin has worked in the past. Gave patient a  three-day trial of indomethacin 75 mg daily

## 2017-02-04 ENCOUNTER — Ambulatory Visit (HOSPITAL_COMMUNITY): Payer: 59

## 2017-02-04 ENCOUNTER — Telehealth: Payer: Self-pay | Admitting: *Deleted

## 2017-02-04 ENCOUNTER — Inpatient Hospital Stay: Payer: 59 | Admitting: Family Medicine

## 2017-02-04 LAB — URIC ACID: Uric Acid: 7.1 mg/dL (ref 3.7–8.6)

## 2017-02-04 NOTE — Telephone Encounter (Signed)
Patient is aware of results and has already begun to get relief from his medication. Jazmin Hartsell,CMA

## 2017-02-04 NOTE — Telephone Encounter (Signed)
Gout was addressed during office visit yesterday with Dr. Grandville Silos [name], MD

## 2017-02-04 NOTE — Progress Notes (Signed)
It is an slightly elevated level. We would like him to come back in 2-3 months to optimize his medication regimen or sooner if he is having any additional gout issues.

## 2017-02-04 NOTE — Telephone Encounter (Signed)
-----   Message from Bonnita Hollow, MD sent at 02/04/2017 12:42 PM EDT ----- It is an slightly elevated level. We would like him to come back in 2-3 months to optimize his medication regimen or sooner if he is having any additional gout issues.

## 2017-02-10 ENCOUNTER — Other Ambulatory Visit: Payer: Self-pay | Admitting: Family Medicine

## 2017-02-12 NOTE — Progress Notes (Deleted)
Cardiology Office Note    Date:  02/12/2017   ID:  [name], DOB 08-09-1965, MRN [id number]  PCP:  [name], MD  Primary Cardiologist: Dr. Acie Fredrickson,  Electrophysiologist:  Dr. Aggie Cosier NP for afib clinic  Chief Complaint: Hospital follow up for chest pain   History of Present Illness:   [name] is a 51 y.o. male of PAF on Tikosyn, OSA on CPAP   The patient had a low risk Lexiscan Myoview on 03/04/2015 Echocardiogram on 09/13/2015 showed normal LV systolic function with EF 65-70%, mildly dilated left atrium, moderately dilated right atrium  Admitted 01/2017 for chest pain. He underwent stress test 01/23/17 that showed large area of mild reversibility is identified involving the apex and lateral wall with right. Follow up cath showed no coronary artery disease. False positive nuclear perfusion study most likely due to the patient's body habitus and marked diaphragmatic attenuation. HR of 40s without evidence of heart block. Asymptomatic. Started on lipitor given risk factors for CAD.   Here today for follow up.    Past Medical History:  Diagnosis Date  . Arthritis    "knees" (01/22/2017)  . Chronic lower back pain   . GERD (gastroesophageal reflux disease)   . Gout    "no flares in awhile" (01/22/2017)  . History of MRI of cervical spine 09/25/10   normal; left shoulder normal as well  . Hypertension   . Migraine    "~ q 6 months" (01/22/2017)  . Morbid obesity ([place])    "I've lost 43# in 15 weeks w/weight watchers" (01/22/2017)  . OSA on CPAP   . Paroxysmal atrial fibrillation ([place]) 7/15   chads2vasc score is 1    Past Surgical History:  Procedure Laterality Date  . CARDIAC CATHETERIZATION  05/2005   "no blockage"  . KNEE ARTHROSCOPY Right 02/2013  . LEFT HEART CATH AND CORONARY ANGIOGRAPHY N/A 01/27/2017   Procedure: Left Heart Cath and Coronary Angiography;  Surgeon: Troy Sine, MD;  Location: [facility];  Service: Cardiovascular;   Laterality: N/A;    Current Medications: Prior to Admission medications   Medication Sig Start Date End Date Taking? Authorizing Provider  aspirin EC 81 MG EC tablet Take 1 tablet (81 mg total) by mouth daily. 01/29/17   Bufford Lope, DO  aspirin-acetaminophen-caffeine (EXCEDRIN MIGRAINE) [phone] MG tablet Take 3 tablets by mouth every 6 (six) hours as needed for headache.    [provider]  atorvastatin (LIPITOR) 20 MG tablet Take 1 tablet (20 mg total) by mouth daily. 01/28/17   Bufford Lope, DO  dofetilide (TIKOSYN) 500 MCG capsule Take 1 capsule (500 mcg total) by mouth 2 (two) times daily. 09/25/16   Thompson Grayer, MD  ELIQUIS 5 MG TABS tablet TAKE ONE TABLET BY MOUTH TWICE DAILY 02/11/17   [name], MD  fluticasone [facility]) 50 MCG/ACT nasal spray Place 1 spray into both nostrils daily.    [provider]  Hypromellose ([employer] GENTLE LUBRICANT EYE DROPS OP) Place 1 drop into both eyes daily as needed (dry eyes).    [provider]  ibuprofen (ADVIL,MOTRIN) 200 MG tablet Take 800 mg by mouth every 6 (six) hours as needed (pain). Reported on 01/05/2016    [provider]  loratadine (CLARITIN) 10 MG tablet Take 1 tablet (10 mg total) by mouth daily. 01/29/17   Bufford Lope, DO  Omega-3 Fatty Acids (FISH OIL) 1000 MG CAPS Take 2,000 mg by mouth daily.  [provider]  omeprazole (PRILOSEC) 20 MG capsule TAKE ONE CAPSULE BY MOUTH TWICE DAILY Patient taking differently: TAKE ONE CAPSULE BY MOUTH every day 09/25/16   [name], MD  ondansetron (ZOFRAN-ODT) 8 MG disintegrating tablet Take 1 tablet (8 mg total) by mouth every 8 (eight) hours as needed for nausea or vomiting. 07/17/16   Davonna Belling, MD  PRESCRIPTION MEDICATION Inhale into the lungs at bedtime. CPAP    [provider]  TURMERIC PO Take 1 tablet by mouth every Monday, Wednesday, and Friday.    [provider]    Allergies:    Hydrochlorothiazide-propranolol [propranolol-hctz] and Penicillins   Social History   Social History  . Marital status: Married    Spouse name: N/A  . Number of children: N/A  . Years of education: N/A   Social History Main Topics  . Smoking status: Former Smoker    Packs/day: 1.00    Years: 10.00    Types: Cigarettes    Quit date: 04/02/1994  . Smokeless tobacco: Former Systems developer    Types: Chew     Comment: "chewed in my teens"  . Alcohol use No     Comment: "stopped in 1994"  . Drug use: No     Comment: "stopped marijuna in 1994"  . Sexual activity: Yes   Other Topics Concern  . Not on file   Social History Narrative   Lives in [place] with spouse.  3 children, all grown.   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 ***  ROS:   Please see the history of present illness.    ROS All other systems reviewed and are negative.   PHYSICAL EXAM:   VS:  There were no vitals taken for this visit.   GEN: Well nourished, well developed, in no acute distress  HEENT: normal  Neck: no JVD, carotid bruits, or masses Cardiac: ***RRR; no murmurs, rubs, or gallops,no edema  Respiratory:  clear to auscultation bilaterally, normal work of breathing GI: soft, nontender, nondistended, + BS MS: no deformity or atrophy  Skin: warm and dry, no rash Neuro:  Alert and Oriented x 3, Strength and sensation are intact Psych: euthymic mood, full affect  Wt Readings from Last 3 Encounters:  02/03/17 (!) 307 lb (139.3 kg)  01/28/17 299 lb 6.4 oz (135.8 kg)  01/06/17 (!) 301 lb 3.2 oz (136.6 kg)      Studies/Labs Reviewed:   EKG:  EKG is ordered today.  The ekg ordered today demonstrates ***  Recent Labs: 09/11/2016: ALT 26 01/22/2017: TSH 1.233 01/24/2017: Magnesium 2.2 01/28/2017: BUN 10; Creatinine, Ser 0.93; Hemoglobin 12.8; Platelets 197; Potassium  4.3; Sodium 140   Lipid Panel    Component Value Date/Time   CHOL 179 01/22/2017 2320   TRIG 230 (H) 01/22/2017 2320   HDL 25 (L) 01/22/2017 2320   CHOLHDL 7.2 01/22/2017 2320   VLDL 46 (H) 01/22/2017 2320   LDLCALC 108 (H) 01/22/2017 2320    Additional studies/ records that were reviewed today include:   Left heart catheterization 01/27/17:  The left ventricular ejection fraction is 55-65% by visual estimate.  LV end diastolic pressure is normal.  The left ventricular systolic function is normal.  Normal LV function without focal segmental wall motion abnormalities with an ejection fraction estimated at 60%.  Normal coronary arteries.  RECOMMENDATION: Medical therapy for the patient's PAF with resumption of anticoagulation therapy. False positive nuclear perfusion study most likely due to the patient's body habitus and marked diaphragmatic attenuation.   Nuclear stress test 01/24/17 IMPRESSION: 1. Large area of mild reversibility is identified involving the apex and lateral wall. This has a summed difference score equal to 6. 2. Normal left ventricular wall motion. 3. Left ventricular ejection fraction 52% 4. Non invasive risk stratification*  2D ECHO: 01/23/2017 LV EF: 60% - 65% Study Conclusions - Left ventricle: The cavity size was mildly dilated. There was moderate focal basal hypertrophy. Systolic function was normal. The estimated ejection fraction was in the range of 60% to 65%. Wall motion was normal; there were no regional wall motion abnormalities. Features are consistent with a pseudonormal left ventricular filling pattern, with concomitant abnormal relaxation and increased filling pressure (grade 2 diastolic dysfunction). Doppler parameters are consistent with high ventricular filling pressure. - Aorta: Ascending aorta diameter: 38 mm (ED). - Ascending aorta: The ascending aorta was mildly dilated. - Mitral valve: Valve area by  pressure half-time: 2.27 cm^2. - Right ventricle: The cavity size was severely dilated. Wall thickness was normal.   ASSESSMENT & PLAN:    1. Chest pain  - false positive stress test. Cath with normal coronaries. Continue statin. Will start statin given recent statin initiation.   2.PAF - Continue Tikosyn and Eliquis  3. OSA on CPAP  4. HTN    Medication Adjustments/Labs and Tests Ordered: Current medicines are reviewed at length with the patient today.  Concerns regarding medicines are outlined above.  Medication changes, Labs and Tests ordered today are listed in the Patient Instructions below. There are no Patient Instructions on file for this visit.   Jarrett Soho, [place]  02/12/2017 10:56 AM    [facility] [address], [place], [place]  [address] Phone: [phone]; Fax: [phone]

## 2017-02-18 ENCOUNTER — Ambulatory Visit: Payer: 59 | Admitting: Physician Assistant

## 2017-03-04 IMAGING — CT CT HEAD W/O CM
3 of 4 series · 17 of 47 positions shown, 20 images · non-contrast
Comparison: None.

CLINICAL DATA: Left-sided facial numbness for 1 day

EXAM:
CT HEAD WITHOUT CONTRAST
TECHNIQUE: Contiguous axial images were obtained from the base of the skull
through the vertex without intravenous contrast.

[Series 201: head w/o, idose (1) · axial · non-contrast · 0.45mm/px · z∈[+1370,+1500]mm · 11 of 32 slices shown, 14 images]
[im 3/32  brain]
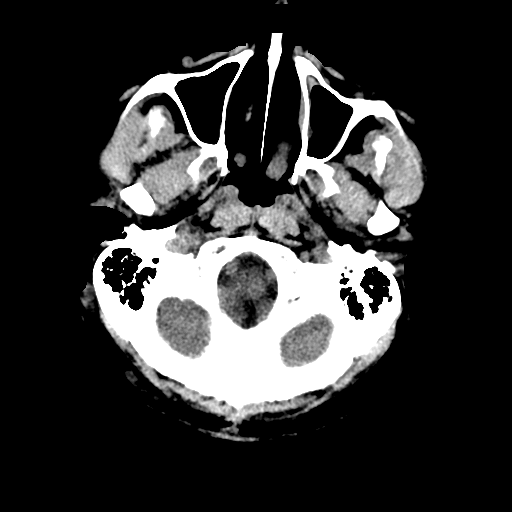
[im 3/32  bone]
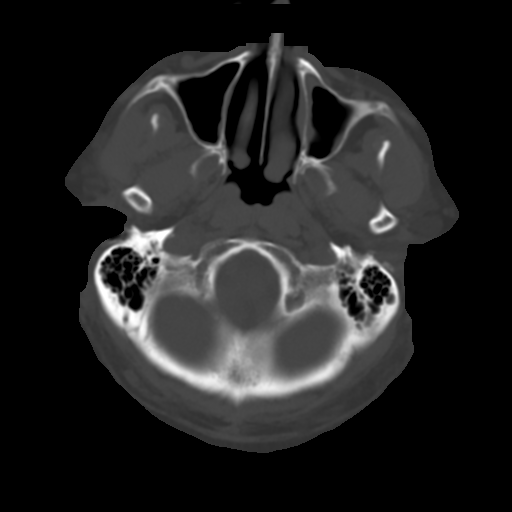
[im 5/32  brain]
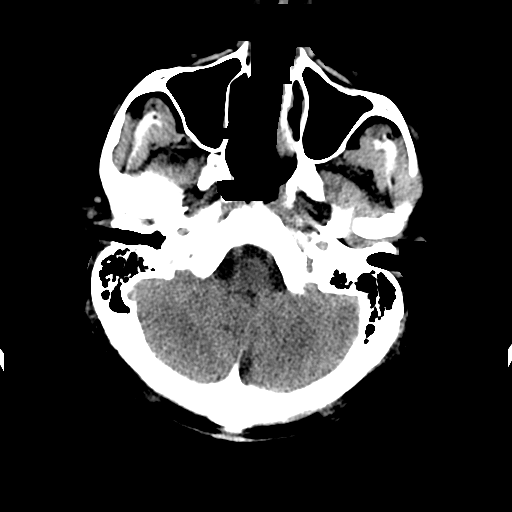
[im 7/32  brain]
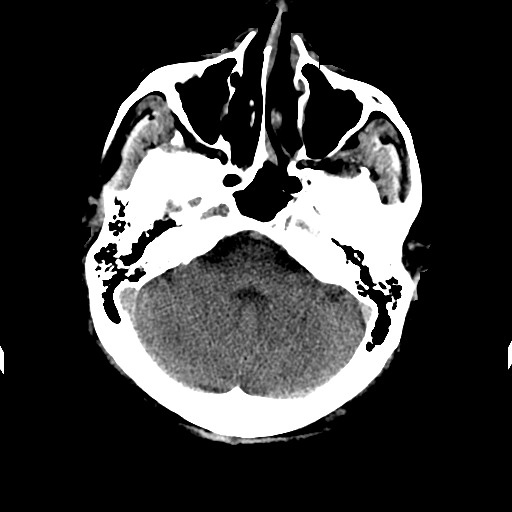
[im 12/32  brain]
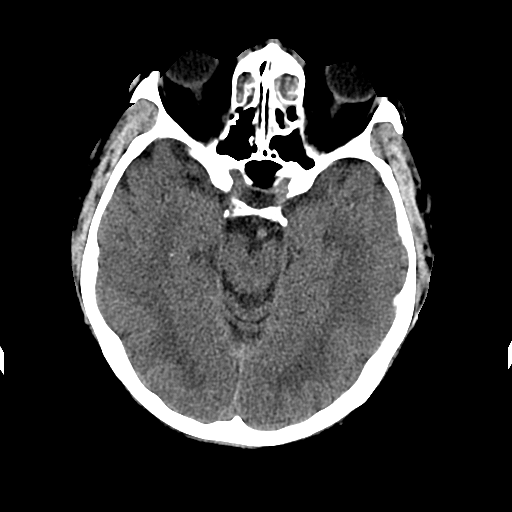
[im 14/32  brain]
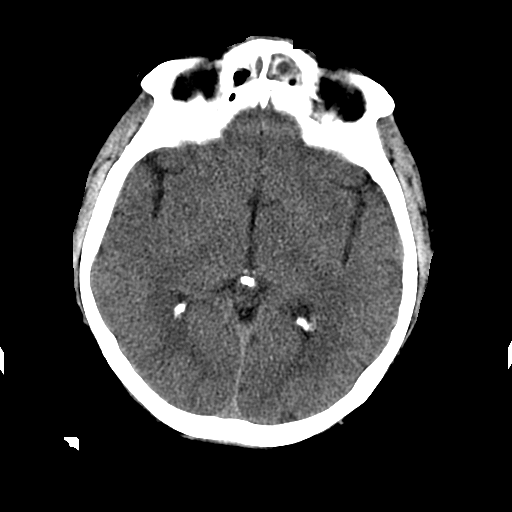
[im 14/32  bone]
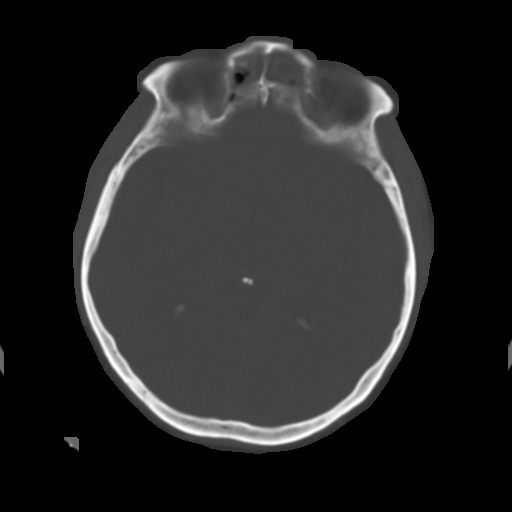
[im 16/32  brain]
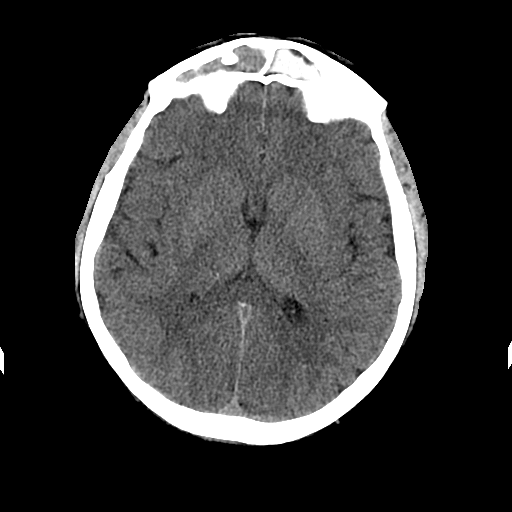
[im 18/32  brain]
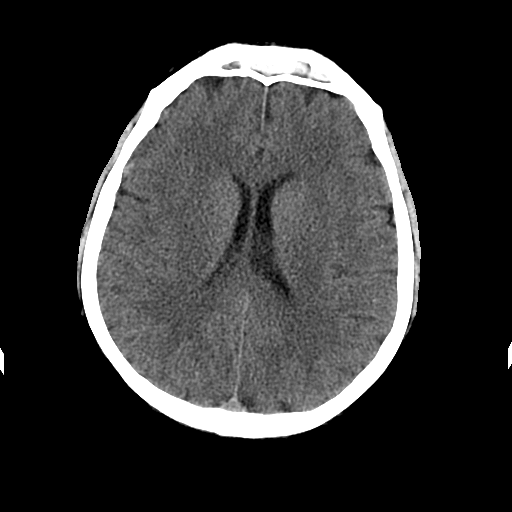
[im 20/32  brain]
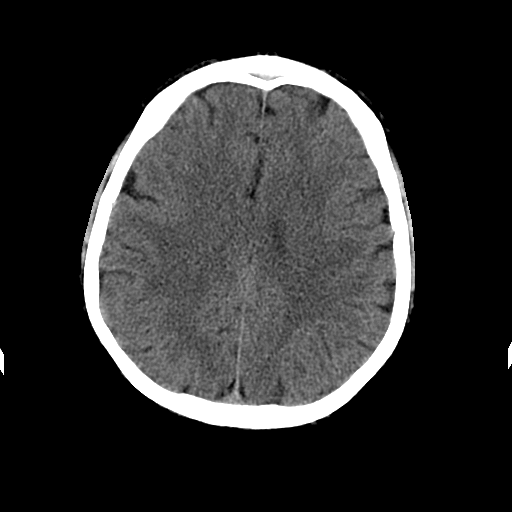
[im 25/32  brain]
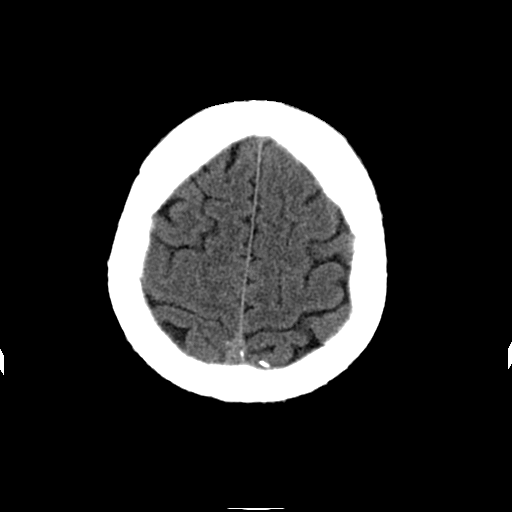
[im 25/32  bone]
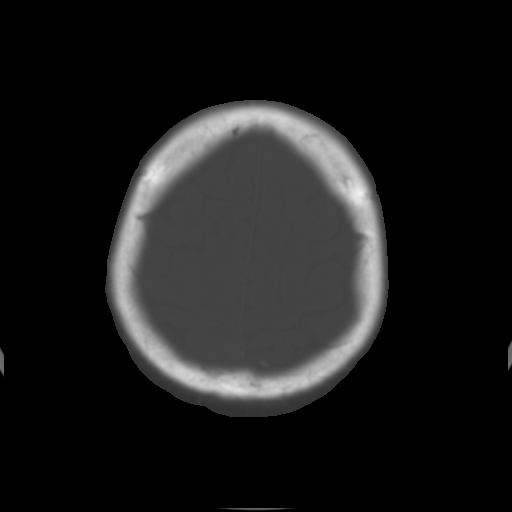
[im 27/32  brain]
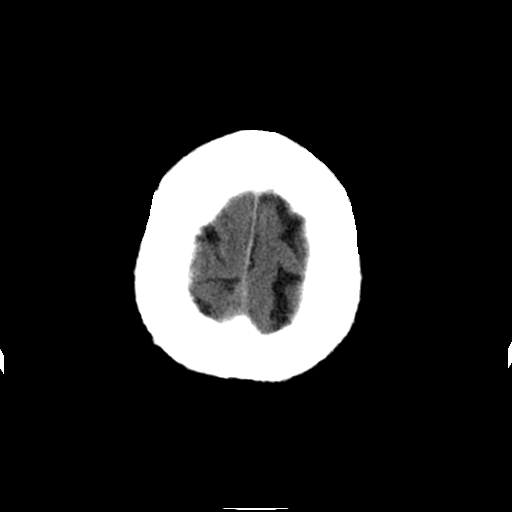
[im 29/32  brain]
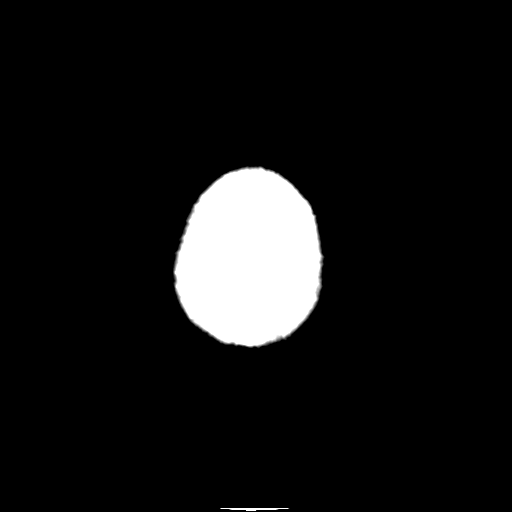

[Series 203: coronal st, idose (1) · coronal · 0.40mm/px · 3 of 76 slices shown]
[im 26/76  brain]
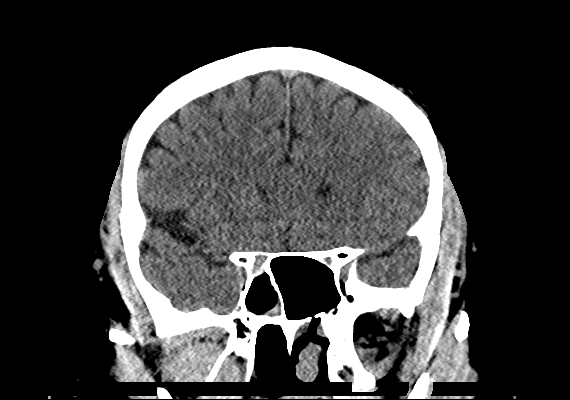
[im 34/76  brain]
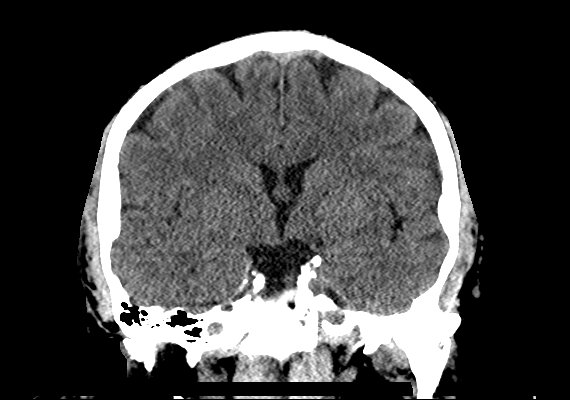
[im 42/76  brain]
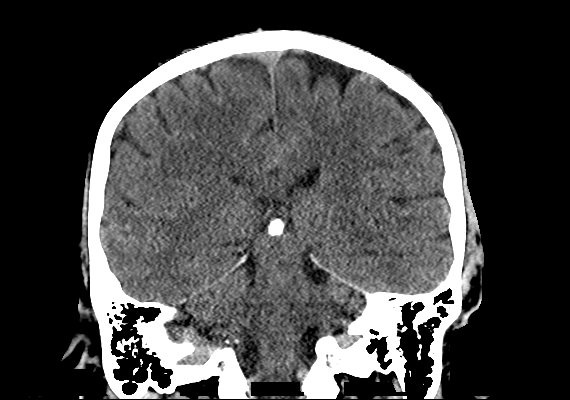

[Series 204: sagittal st, idose (1) · sagittal · 0.40mm/px · 3 of 76 slices shown]
[im 26/76  brain]
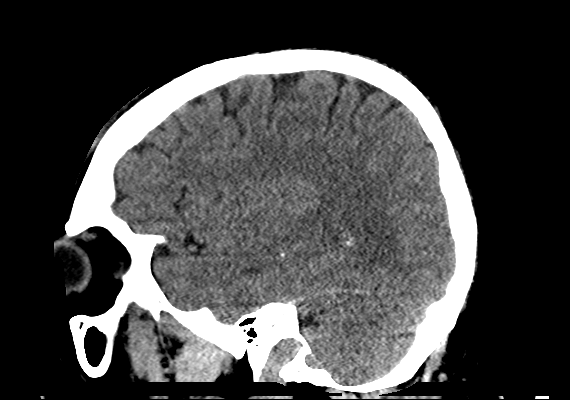
[im 38/76  brain]
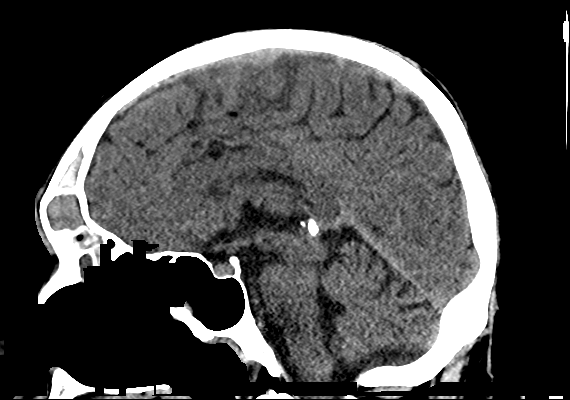
[im 51/76  brain]
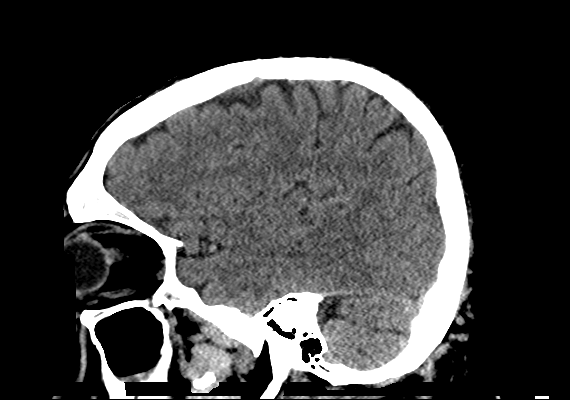

[17 of 47 positions shown; findings below may reference images not displayed]

FINDINGS: The ventricles are normal in size and configuration. There is no
intracranial mass, hemorrhage, extra-axial fluid collection, or
midline shift. The gray-white compartments are normal. There is no
acute infarct evident. The bony calvarium appears intact. Visualized
mastoid air cells are clear. No intraorbital lesions are evident.
There is diffuse opacification of the frontal sinuses bilaterally.
There is extensive ethmoid sinus disease bilaterally. There is
mucosal thickening in the right sphenoid sinus as well as in both
maxillary antra, slightly more on the left than on the right.
IMPRESSION: Extensive paranasal sinus disease. No intracranial mass, hemorrhage,
or focal gray -white compartment lesions/acute appearing infarct.

## 2017-03-04 IMAGING — DX DG CHEST 2V
2 series · 2 of 2 positions shown · non-contrast
Comparison: 06/23/2015

CLINICAL DATA: Shortness of breath. Left-sided numbness and left
chest pain.

EXAM:
CHEST  2 VIEW

[w chest pa]
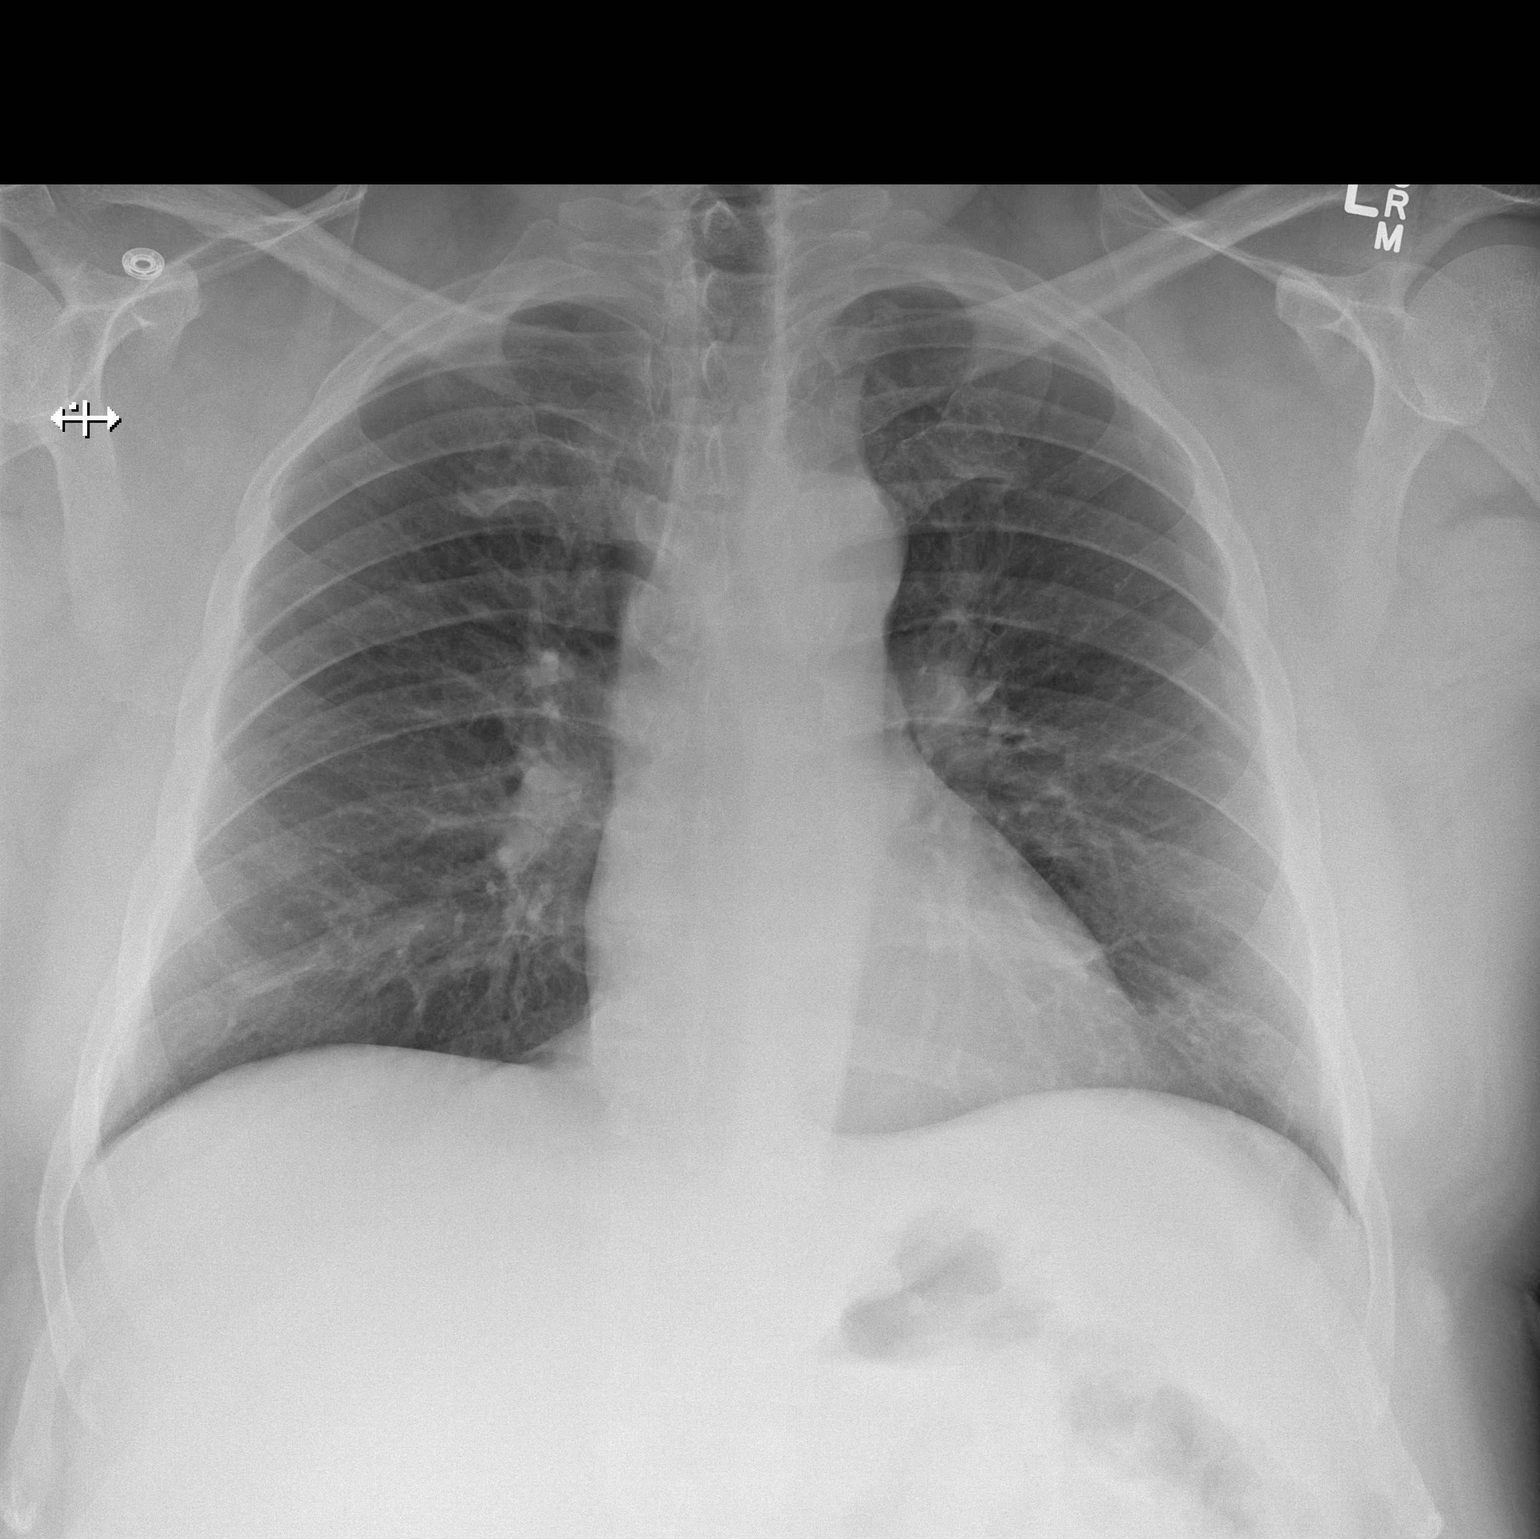

[w chest lat]
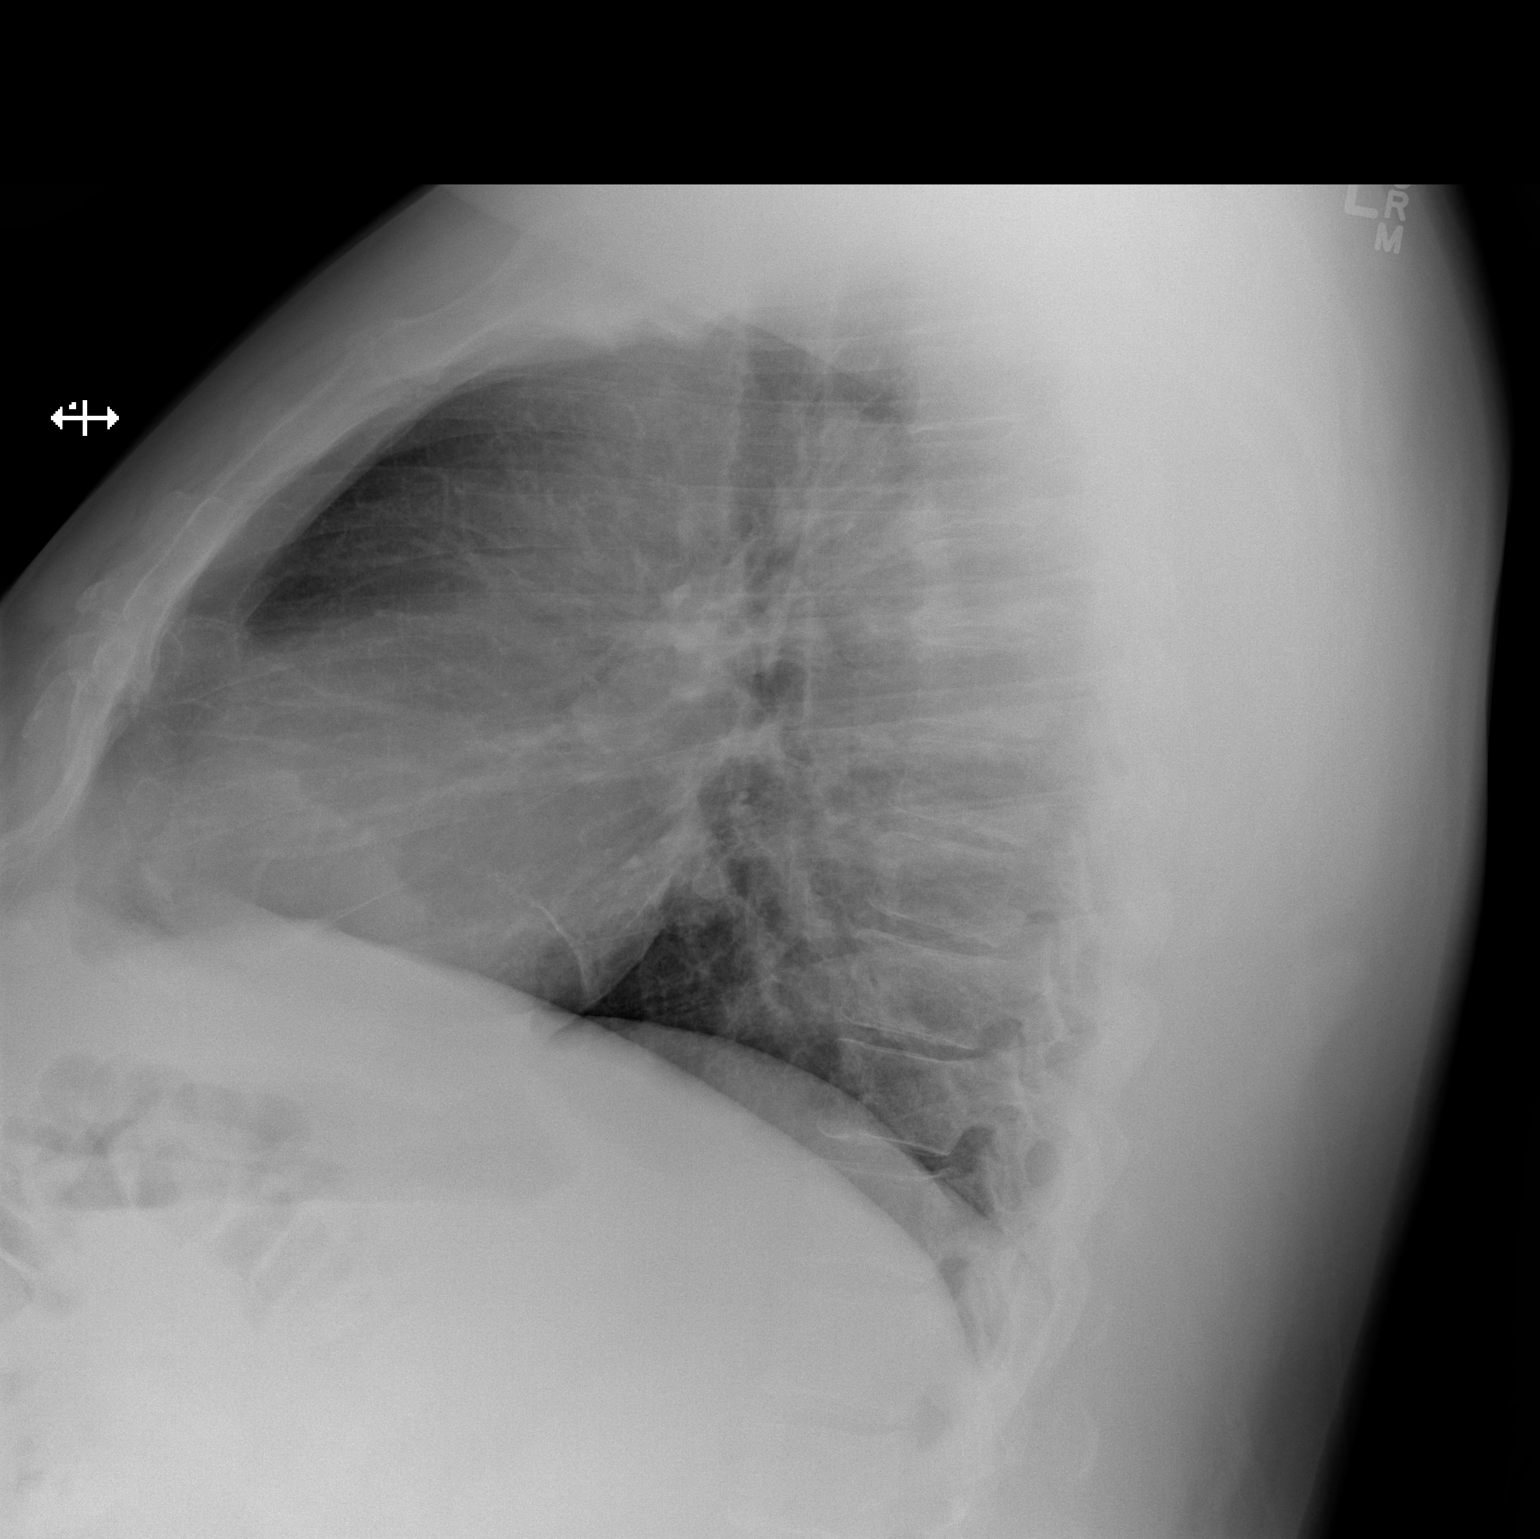

[2 of 2 positions shown; findings below may reference images not displayed]

FINDINGS: Both lungs are clear. Heart and mediastinum are within normal
limits. Trachea is midline. No large pleural effusions. No acute
bone abnormality.
IMPRESSION: No active cardiopulmonary disease.

## 2017-03-06 ENCOUNTER — Other Ambulatory Visit: Payer: Self-pay | Admitting: Family Medicine

## 2017-03-06 ENCOUNTER — Other Ambulatory Visit: Payer: Self-pay | Admitting: *Deleted

## 2017-03-07 MED ORDER — LORATADINE 10 MG PO TABS: 10.0000 mg | ORAL_TABLET | Freq: Every day | ORAL | 0 refills | Status: DC

## 2017-03-07 MED ORDER — ATORVASTATIN CALCIUM 20 MG PO TABS: 20.0000 mg | ORAL_TABLET | Freq: Every day | ORAL | 0 refills | Status: DC

## 2017-03-13 DIAGNOSIS — S6991XS Unspecified injury of right wrist, hand and finger(s), sequela: Secondary | ICD-10-CM | POA: Diagnosis not present

## 2017-03-13 DIAGNOSIS — M25551 Pain in right hip: Secondary | ICD-10-CM | POA: Diagnosis not present

## 2017-03-13 DIAGNOSIS — M79644 Pain in right finger(s): Secondary | ICD-10-CM | POA: Diagnosis not present

## 2017-03-13 DIAGNOSIS — M1611 Unilateral primary osteoarthritis, right hip: Secondary | ICD-10-CM | POA: Diagnosis not present

## 2017-03-28 NOTE — Progress Notes (Signed)
Cardiology Office Note    Date:  03/31/2017   ID:  [name], DOB Nov 08, 1965, MRN [id number]  PCP:  [name], MD  Cardiologist:  Dr. Acie Fredrickson  Electrophysiologist:  Dr. Aggie Cosier NP (afib clinic)  Chief Complaint: afib follow up   History of Present Illness:   [name] is a 51 y.o. male with a history of obesity, PAF on TIkosyn and Eliquis, OSA on CPAP, HTN, HLD and GERD presented for follow up.  Admitted 01/2017 for left-sided sharp/pressure type chest pain associated with shortness of breath, lightheadedness and left arm numbness. Stress test abnormal. Follow up cath showed normal coronaries. False positive nuclear perfusion study most likely due to the patient's body habitus and marked diaphragmatic attenuation. Noted HR of 40s at rest. Asymptomatic.   Here today for follow up. [facility] He does for approximately 45 minutes without any chest pain or shortness of breath. Patient denies palpitation, orthopnea, PND, syncope, lower extremity edema, dizziness or melena. Compliant with medication and CPAP. Concern about low heart rate. He used to play basketball.    Past Medical History:  Diagnosis Date  . Arthritis    "knees" (01/22/2017)  . Chronic lower back pain   . GERD (gastroesophageal reflux disease)   . Gout    "no flares in awhile" (01/22/2017)  . History of MRI of cervical spine 09/25/10   normal; left shoulder normal as well  . Hypertension   . Migraine    "~ q 6 months" (01/22/2017)  . Morbid obesity ([place])    "I've lost 43# in 15 weeks w/weight watchers" (01/22/2017)  . OSA on CPAP   . Paroxysmal atrial fibrillation ([place]) 7/15   chads2vasc score is 1    Past Surgical History:  Procedure Laterality Date  . CARDIAC CATHETERIZATION  05/2005   "no blockage"  . KNEE ARTHROSCOPY Right 02/2013  . LEFT HEART CATH AND CORONARY ANGIOGRAPHY N/A 01/27/2017   Procedure: Left Heart Cath and Coronary Angiography;  Surgeon: Troy Sine,  MD;  Location: [facility];  Service: Cardiovascular;  Laterality: N/A;    Current Medications:  Prior to Admission medications   Medication Sig Start Date End Date Taking? Authorizing Provider  aspirin EC 81 MG EC tablet Take 1 tablet (81 mg total) by mouth daily. 01/29/17  Yes Bufford Lope, DO  aspirin-acetaminophen-caffeine (EXCEDRIN MIGRAINE) [phone] MG tablet Take 3 tablets by mouth every 6 (six) hours as needed for headache.   Yes [provider]  atorvastatin (LIPITOR) 20 MG tablet Take 1 tablet (20 mg total) by mouth daily. 03/07/17  Yes Alveda Reasons, MD  dofetilide (TIKOSYN) 500 MCG capsule Take 1 capsule (500 mcg total) by mouth 2 (two) times daily. 09/25/16  Yes Allred, Jeneen Rinks, MD  ELIQUIS 5 MG TABS tablet TAKE ONE TABLET BY MOUTH TWICE DAILY 02/11/17  Yes [name], MD  EQ ALLERGY RELIEF 10 MG tablet TAKE 1 TABLET BY MOUTH ONCE DAILY 03/07/17  Yes Alveda Reasons, MD  fluticasone (FLONASE) 50 MCG/ACT nasal spray Place 1 spray into both nostrils daily.   Yes [provider]  Hypromellose ([employer] GENTLE LUBRICANT EYE DROPS OP) Place 1 drop into both eyes daily as needed (dry eyes).   Yes [provider]  ibuprofen (ADVIL,MOTRIN) 200 MG tablet Take 800 mg by mouth every 6 (six) hours as needed (pain). Reported on 01/05/2016   Yes [provider]  Omega-3 Fatty Acids (FISH OIL) 1000 MG  CAPS Take 2,000 mg by mouth daily.    Yes [provider]  omeprazole (PRILOSEC) 20 MG capsule TAKE ONE CAPSULE BY MOUTH TWICE DAILY Patient taking differently: TAKE ONE CAPSULE BY MOUTH every day 09/25/16  Yes [name], MD  ondansetron (ZOFRAN-ODT) 8 MG disintegrating tablet Take 1 tablet (8 mg total) by mouth every 8 (eight) hours as needed for nausea or vomiting. 07/17/16  Yes Davonna Belling, MD  PRESCRIPTION MEDICATION Inhale into the lungs at bedtime. CPAP   Yes [provider]  TURMERIC PO Take 1 tablet by mouth every  Monday, Wednesday, and Friday.   Yes [provider]    Allergies:   Hydrochlorothiazide-propranolol [propranolol-hctz] and Penicillins   Social History   Social History  . Marital status: Married    Spouse name: N/A  . Number of children: N/A  . Years of education: N/A   Social History Main Topics  . Smoking status: Former Smoker    Packs/day: 1.00    Years: 10.00    Types: Cigarettes    Quit date: 04/02/1994  . Smokeless tobacco: Former Systems developer    Types: Chew     Comment: "chewed in my teens"  . Alcohol use No     Comment: "stopped in 1994"  . Drug use: No     Comment: "stopped marijuna in 1994"  . Sexual activity: Yes   Other Topics Concern  . None   Social History Narrative   Lives in [place] with spouse.  3 children, all grown.   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   ROS:   Please see the history of present illness.    ROS All other systems reviewed and are negative.   PHYSICAL EXAM:   VS:  BP 134/88   Pulse (!) 54   Ht 6\' 2"  (1.88 m)   Wt (!) 303 lb (137.4 kg)   BMI 38.90 kg/m    GEN: Well nourished, well developed, in no acute distress  HEENT: normal  Neck: no JVD, carotid bruits, or masses Cardiac: RRR; no murmurs, rubs, or gallops,no edema  Respiratory:  clear to auscultation bilaterally, normal work of breathing GI: soft, nontender, nondistended, + BS MS: no deformity or atrophy  Skin: warm and dry, no rash Neuro:  Alert and Oriented x 3, Strength and sensation are intact Psych: euthymic mood, full affect  Wt Readings from Last 3 Encounters:  03/31/17 (!) 303 lb (137.4 kg)  02/03/17 (!) 307 lb (139.3 kg)  01/28/17 299 lb 6.4 oz (135.8 kg)      Studies/Labs Reviewed:   EKG:  EKG is not ordered today.    Recent Labs: 09/11/2016: ALT 26 01/22/2017: TSH 1.233 01/24/2017: Magnesium  2.2 01/28/2017: BUN 10; Creatinine, Ser 0.93; Hemoglobin 12.8; Platelets 197; Potassium 4.3; Sodium 140   Lipid Panel    Component Value Date/Time   CHOL 179 01/22/2017 2320   TRIG 230 (H) 01/22/2017 2320   HDL 25 (L) 01/22/2017 2320   CHOLHDL 7.2 01/22/2017 2320   VLDL 46 (H) 01/22/2017 2320   LDLCALC 108 (H) 01/22/2017 2320    Additional studies/ records that were reviewed today include:   Left heart catheterization 01/27/17:  The left ventricular ejection fraction is 55-65% by visual estimate.  LV end diastolic pressure is normal.  The left ventricular systolic function is  normal.  Normal LV function without focal segmental wall motion abnormalities with an ejection fraction estimated at 60%.  Normal coronary arteries.  RECOMMENDATION: Medical therapy for the patient's PAF with resumption of anticoagulation therapy. False positive nuclear perfusion study most likely due to the patient's body habitus and marked diaphragmatic attenuation.   Nuclear stress test 01/24/17 IMPRESSION: 1. Large area of mild reversibility is identified involving the apex and lateral wall. This has a summed difference score equal to 6. 2. Normal left ventricular wall motion. 3. Left ventricular ejection fraction 52% 4. Non invasive risk stratification*  2D ECHO: 01/23/2017 LV EF: 60% - 65% Study Conclusions - Left ventricle: The cavity size was mildly dilated. There was moderate focal basal hypertrophy. Systolic function was normal. The estimated ejection fraction was in the range of 60% to 65%. Wall motion was normal; there were no regional wall motion abnormalities. Features are consistent with a pseudonormal left ventricular filling pattern, with concomitant abnormal relaxation and increased filling pressure (grade 2 diastolic dysfunction). Doppler parameters are consistent with high ventricular filling pressure. - Aorta: Ascending aorta diameter: 38 mm (ED). -  Ascending aorta: The ascending aorta was mildly dilated. - Mitral valve: Valve area by pressure half-time: 2.27 cm^2. - Right ventricle: The cavity size was severely dilated. Wall thickness was normal.   ASSESSMENT & PLAN:    1. PAF - Maintaining sinus rhythm on Tikosyn. No bleeding issue on Eliquis. Check electrolytes. Concern regarding low heart rate however this seems chronic. Noted heart rate of 40s during last admission. He used to play basketball.   2. OSA on CPAP - Compliant. Continue exercise regimen.  3. HLD - check LFT ans lipid panel today. Continue statin.   4.Frequent Urination - Check UA. If normal, f/u with PCP.    Medication Adjustments/Labs and Tests Ordered: Current medicines are reviewed at length with the patient today.  Concerns regarding medicines are outlined above.  Medication changes, Labs and Tests ordered today are listed in the Patient Instructions below. Patient Instructions  Medication Instructions:  STOP Aspirin  Labwork: Your physician recommends that you return for lab work in: TODAY-LIPID PROFILE, CMP, MAG, U/A  Testing/Procedures: None   Follow-Up: Your physician recommends that you schedule a follow-up appointment in: 4 months with DR Geraldine physician recommends that you schedule a follow-up appointment in: 1 month AFIB CLINIC  Any Other Special Instructions Will Be Listed Below (If Applicable).  If you need a refill on your cardiac medications before your next appointment, please call your pharmacy.     Jarrett Soho, [place]  03/31/2017 10:32 AM    [facility] [place], [place], [place]  [address] Phone: [phone]; Fax: [phone]

## 2017-03-31 ENCOUNTER — Encounter (INDEPENDENT_AMBULATORY_CARE_PROVIDER_SITE_OTHER): Payer: Self-pay

## 2017-03-31 ENCOUNTER — Encounter: Payer: Self-pay | Admitting: Physician Assistant

## 2017-03-31 ENCOUNTER — Ambulatory Visit (INDEPENDENT_AMBULATORY_CARE_PROVIDER_SITE_OTHER): Payer: 59 | Admitting: Physician Assistant

## 2017-03-31 VITALS — BP 134/88 | HR 54 | Ht 74.0 in | Wt 303.0 lb

## 2017-03-31 DIAGNOSIS — G4733 Obstructive sleep apnea (adult) (pediatric): Secondary | ICD-10-CM | POA: Diagnosis not present

## 2017-03-31 DIAGNOSIS — R350 Frequency of micturition: Secondary | ICD-10-CM

## 2017-03-31 DIAGNOSIS — I480 Paroxysmal atrial fibrillation: Secondary | ICD-10-CM | POA: Diagnosis not present

## 2017-03-31 DIAGNOSIS — Z79899 Other long term (current) drug therapy: Secondary | ICD-10-CM | POA: Diagnosis not present

## 2017-03-31 DIAGNOSIS — E785 Hyperlipidemia, unspecified: Secondary | ICD-10-CM | POA: Diagnosis not present

## 2017-03-31 DIAGNOSIS — Z9989 Dependence on other enabling machines and devices: Secondary | ICD-10-CM

## 2017-03-31 LAB — URINALYSIS
Bilirubin, UA: NEGATIVE
GLUCOSE, UA: NEGATIVE
Ketones, UA: NEGATIVE
Leukocytes, UA: NEGATIVE
Nitrite, UA: NEGATIVE
PROTEIN UA: NEGATIVE
Specific Gravity, UA: 1.021 (ref 1.005–1.030)
UUROB: 0.2 mg/dL (ref 0.2–1.0)
pH, UA: 6 (ref 5.0–7.5)

## 2017-03-31 LAB — COMPREHENSIVE METABOLIC PANEL
A/G RATIO: 1.7 (ref 1.2–2.2)
ALBUMIN: 4.5 g/dL (ref 3.5–5.5)
ALT: 28 IU/L (ref 0–44)
AST: 42 IU/L — ABNORMAL HIGH (ref 0–40)
Alkaline Phosphatase: 86 IU/L (ref 39–117)
BILIRUBIN TOTAL: 0.3 mg/dL (ref 0.0–1.2)
BUN / CREAT RATIO: 10 (ref 9–20)
BUN: 10 mg/dL (ref 6–24)
CHLORIDE: 104 mmol/L (ref 96–106)
CO2: 23 mmol/L (ref 20–29)
Calcium: 9.7 mg/dL (ref 8.7–10.2)
Creatinine, Ser: 0.99 mg/dL (ref 0.76–1.27)
GFR calc non Af Amer: 87 mL/min/{1.73_m2} (ref >59)
GFR, EST AFRICAN AMERICAN: 101 mL/min/{1.73_m2} (ref >59)
GLOBULIN, TOTAL: 2.7 g/dL (ref 1.5–4.5)
Glucose: 100 mg/dL — ABNORMAL HIGH (ref 65–99)
POTASSIUM: 4.3 mmol/L (ref 3.5–5.2)
SODIUM: 143 mmol/L (ref 134–144)
TOTAL PROTEIN: 7.2 g/dL (ref 6.0–8.5)

## 2017-03-31 LAB — LIPID PANEL
Chol/HDL Ratio: 3.7 ratio (ref 0.0–5.0)
Cholesterol, Total: 125 mg/dL (ref 100–199)
HDL: 34 mg/dL — AB (ref >39)
LDL Calculated: 76 mg/dL (ref 0–99)
Triglycerides: 74 mg/dL (ref 0–149)
VLDL Cholesterol Cal: 15 mg/dL (ref 5–40)

## 2017-03-31 LAB — MAGNESIUM: Magnesium: 2.2 mg/dL (ref 1.6–2.3)

## 2017-03-31 MED ORDER — ATORVASTATIN CALCIUM 20 MG PO TABS: 20.0000 mg | ORAL_TABLET | Freq: Every day | ORAL | 3 refills | Status: DC

## 2017-03-31 NOTE — Patient Instructions (Addendum)
Medication Instructions:  STOP Aspirin  Labwork: Your physician recommends that you return for lab work in: TODAY-LIPID PROFILE, CMP, MAG, U/A  Testing/Procedures: None   Follow-Up: Your physician recommends that you schedule a follow-up appointment in: 4 months with DR [place] physician recommends that you schedule a follow-up appointment in: 1 month AFIB CLINIC  Any Other Special Instructions Will Be Listed Below (If Applicable).  If you need a refill on your cardiac medications before your next appointment, please call your pharmacy.

## 2017-04-03 ENCOUNTER — Telehealth: Payer: Self-pay | Admitting: Physician Assistant

## 2017-04-03 NOTE — Telephone Encounter (Signed)
Pt returned my call and he has been made aware of his UA results and he verbalized understanding.

## 2017-04-03 NOTE — Telephone Encounter (Signed)
-----   Message from [place], [place] sent at 04/02/2017  9:56 AM EDT ----- No sign of urine infection.

## 2017-04-03 NOTE — Telephone Encounter (Signed)
-----   Message from [place], [place] sent at 04/02/2017  9:56 AM EDT ----- No sign of urine infection.

## 2017-04-03 NOTE — Telephone Encounter (Signed)
Returned pts call and left another message for pt to call back. 

## 2017-04-03 NOTE — Telephone Encounter (Signed)
New message    Pt is returning call to nurse about lab results. Please call.

## 2017-04-04 ENCOUNTER — Other Ambulatory Visit: Payer: Self-pay | Admitting: Internal Medicine

## 2017-04-15 ENCOUNTER — Encounter (HOSPITAL_COMMUNITY): Payer: Self-pay | Admitting: *Deleted

## 2017-04-21 ENCOUNTER — Other Ambulatory Visit: Payer: Self-pay | Admitting: Family Medicine

## 2017-04-28 ENCOUNTER — Ambulatory Visit (HOSPITAL_COMMUNITY)
Admission: RE | Admit: 2017-04-28 | Discharge: 2017-04-28 | Disposition: A | Discharge status: Home / Self Care | Payer: 59 | Source: Ambulatory Visit | Attending: Nurse Practitioner | Admitting: Nurse Practitioner

## 2017-04-28 ENCOUNTER — Encounter (HOSPITAL_COMMUNITY): Payer: Self-pay | Admitting: Nurse Practitioner

## 2017-04-28 VITALS — BP 136/78 | HR 55 | Ht 74.0 in | Wt 313.6 lb

## 2017-04-28 DIAGNOSIS — I480 Paroxysmal atrial fibrillation: Secondary | ICD-10-CM | POA: Diagnosis not present

## 2017-04-28 DIAGNOSIS — M109 Gout, unspecified: Secondary | ICD-10-CM | POA: Diagnosis not present

## 2017-04-28 DIAGNOSIS — Z880 Allergy status to penicillin: Secondary | ICD-10-CM | POA: Diagnosis not present

## 2017-04-28 DIAGNOSIS — R079 Chest pain, unspecified: Secondary | ICD-10-CM | POA: Diagnosis not present

## 2017-04-28 DIAGNOSIS — Z87891 Personal history of nicotine dependence: Secondary | ICD-10-CM | POA: Diagnosis not present

## 2017-04-28 DIAGNOSIS — Z79899 Other long term (current) drug therapy: Secondary | ICD-10-CM | POA: Insufficient documentation

## 2017-04-28 DIAGNOSIS — Z6841 Body Mass Index (BMI) 40.0 and over, adult: Secondary | ICD-10-CM | POA: Diagnosis not present

## 2017-04-28 DIAGNOSIS — M545 Low back pain: Secondary | ICD-10-CM | POA: Insufficient documentation

## 2017-04-28 DIAGNOSIS — I10 Essential (primary) hypertension: Secondary | ICD-10-CM | POA: Insufficient documentation

## 2017-04-28 DIAGNOSIS — K219 Gastro-esophageal reflux disease without esophagitis: Secondary | ICD-10-CM | POA: Diagnosis not present

## 2017-04-28 DIAGNOSIS — G4733 Obstructive sleep apnea (adult) (pediatric): Secondary | ICD-10-CM | POA: Diagnosis not present

## 2017-04-28 DIAGNOSIS — M1990 Unspecified osteoarthritis, unspecified site: Secondary | ICD-10-CM | POA: Diagnosis not present

## 2017-04-28 DIAGNOSIS — Z7901 Long term (current) use of anticoagulants: Secondary | ICD-10-CM | POA: Diagnosis not present

## 2017-04-28 NOTE — Progress Notes (Signed)
Patient ID: [name], male   DOB: 10/27/1965, 51 y.o.   MRN: [id number]     Primary Care Physician: [name], MD Referring Physician: Dr. Rayann Heman, [facility] f/u Cardiologist: Dr. Leonides Cave is a 51 y.o. male with a h/o PAF on tikosyn tikosyn. He reports a few episodes that converted within a few hours. He has low back and rt hip pain and feels his work is aggravating this pain and thinks sometimes that the pain may trigger intermittent afib.  Since I saw pt last, he was in the hospital  for chest pain in June. He had a positive stress test and went on to cath with normal coronaries, thought to be  false positive nuclear perfusion study, most likely due to the patient's body habitus and marked diaphragmatic attenuation.    Today, he denies symptoms of palpitations, chest pain, shortness of breath, orthopnea, PND, lower extremity edema, dizziness, presyncope, syncope, or neurologic sequela. Positive for back pain.The patient is tolerating medications without difficulties and is otherwise without complaint today.   Past Medical History:  Diagnosis Date  . Arthritis    "knees" (01/22/2017)  . Chronic lower back pain   . GERD (gastroesophageal reflux disease)   . Gout    "no flares in awhile" (01/22/2017)  . History of MRI of cervical spine 09/25/10   normal; left shoulder normal as well  . Hypertension   . Migraine    "~ q 6 months" (01/22/2017)  . Morbid obesity ([place])    "I've lost 43# in 15 weeks w/weight watchers" (01/22/2017)  . OSA on CPAP   . Paroxysmal atrial fibrillation ([place]) 7/15   chads2vasc score is 1   Past Surgical History:  Procedure Laterality Date  . CARDIAC CATHETERIZATION  05/2005   "no blockage"  . KNEE ARTHROSCOPY Right 02/2013  . LEFT HEART CATH AND CORONARY ANGIOGRAPHY N/A 01/27/2017   Procedure: Left Heart Cath and Coronary Angiography;  Surgeon: Troy Sine, MD;  Location: [facility];  Service: Cardiovascular;  Laterality: N/A;     Current Outpatient Prescriptions  Medication Sig Dispense Refill  . aspirin-acetaminophen-caffeine (EXCEDRIN MIGRAINE) 250-250-65 MG tablet Take 3 tablets by mouth every 6 (six) hours as needed for headache.    Marland Kitchen atorvastatin (LIPITOR) 20 MG tablet Take 1 tablet (20 mg total) by mouth daily. 90 tablet 3  . dofetilide (TIKOSYN) 500 MCG capsule TAKE 1 CAPSULE BY MOUTH TWICE DAILY 180 capsule 0  . ELIQUIS 5 MG TABS tablet TAKE ONE TABLET BY MOUTH TWICE DAILY 60 tablet 5  . EQ ALLERGY RELIEF 10 MG tablet TAKE 1 TABLET BY MOUTH ONCE DAILY 30 tablet 0  . fluticasone (FLONASE) 50 MCG/ACT nasal spray Place 1 spray into both nostrils daily.    . Hypromellose ([employer] GENTLE LUBRICANT EYE DROPS OP) Place 1 drop into both eyes daily as needed (dry eyes).    Marland Kitchen ibuprofen (ADVIL,MOTRIN) 200 MG tablet Take 800 mg by mouth every 6 (six) hours as needed (pain). Reported on 01/05/2016    . Omega-3 Fatty Acids (FISH OIL) 500 MG CAPS Take 2 capsules by mouth.    Marland Kitchen omeprazole (PRILOSEC) 20 MG capsule TAKE 1 CAPSULE BY MOUTH TWICE DAILY 60 capsule 5  . ondansetron (ZOFRAN-ODT) 8 MG disintegrating tablet Take 1 tablet (8 mg total) by mouth every 8 (eight) hours as needed for nausea or vomiting. 10 tablet 0  . PRESCRIPTION MEDICATION Inhale into the lungs at bedtime. CPAP    . TURMERIC  PO Take 1 tablet by mouth every Monday, Wednesday, and Friday.     No current facility-administered medications for this encounter.     Allergies  Allergen Reactions  . Hydrochlorothiazide-Propranolol [Propranolol-Hctz] Other (See Comments)    Joints lock up  . Penicillins Other (See Comments)    Hives Makes joints stiff and unable to move Has patient had a PCN reaction causing immediate rash, facial/tongue/throat swelling, SOB or lightheadedness with hypotension: No Has patient had a PCN reaction causing severe rash involving mucus membranes or skin necrosis: No Has patient had a PCN reaction that required hospitalization No Has  patient had a PCN reaction occurring within the last 10 years: No If all of the above answers are "NO", then may proceed with Cephalosporin use.    Social History   Social History  . Marital status: Married    Spouse name: N/A  . Number of children: N/A  . Years of education: N/A   Occupational History  . Not on file.   Social History Main Topics  . Smoking status: Former Smoker    Packs/day: 1.00    Years: 10.00    Types: Cigarettes    Quit date: 04/02/1994  . Smokeless tobacco: Former Systems developer    Types: Chew     Comment: "chewed in my teens"  . Alcohol use No     Comment: "stopped in 1994"  . Drug use: No     Comment: "stopped marijuna in 1994"  . Sexual activity: Yes   Other Topics Concern  . Not on file   Social History Narrative   Lives in [place] with spouse.  3 children, all grown.   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 Atrial fibrillation Mother 52       had a stroke    ROS- All systems are reviewed and negative except as per the HPI above  Physical Exam: Vitals:   04/28/17 0914  BP: 136/78  Pulse: (!) 55  Weight: (!) 313 lb 9.6 oz (142.2 kg)  Height: 6\' 2"  (1.88 m)    GEN- The patient is well appearing, alert and oriented x 3 today.   Head- normocephalic, atraumatic Eyes-  Sclera clear, conjunctiva pink Ears- hearing intact Oropharynx- clear Neck- supple, no JVP Lymph- no cervical lymphadenopathy Lungs- Clear to ausculation bilaterally, normal work of breathing Heart- Regular rate and rhythm, no murmurs, rubs or gallops, PMI not laterally displaced GI- soft, NT, ND, + BS Extremities- no clubbing, cyanosis, or edema MS- no significant deformity or atrophy Skin- no rash or lesion Psych- euthymic mood, full affect Neuro- strength and sensation are intact  EKG-Sinus brady at 55 bpm, Pr int 190 ms, QRS int 96 ms, Qtc 386 ms Epic records reviewed  Assessment and  Plan: 1. afib Doing well on dofetilide 500 mg bid, qtc stable Low afib burden Continue eliquis, chadsvasc score of 1 Labs checked 8/27 with mag at 2.2 and K+ at 4.3  2. Obesity Continue weight loss efforts  4. Chest pain False positive stress test Normal cath/ coronary arteries 01/2017   F/u in afib clinic in March 2019 for tikosyn surveillance F/u with Dr. Copper 12/18  Geroge Baseman. Glynis Hunsucker, [facility] [address] [employer] [place], [place] [address] [phone]

## 2017-04-30 ENCOUNTER — Encounter: Payer: Self-pay | Admitting: Internal Medicine

## 2017-04-30 ENCOUNTER — Ambulatory Visit (INDEPENDENT_AMBULATORY_CARE_PROVIDER_SITE_OTHER): Payer: 59 | Admitting: Internal Medicine

## 2017-04-30 VITALS — BP 130/90 | HR 60 | Temp 97.9°F | Ht 74.0 in | Wt 311.6 lb

## 2017-04-30 DIAGNOSIS — J329 Chronic sinusitis, unspecified: Secondary | ICD-10-CM

## 2017-04-30 MED ORDER — LORATADINE 10 MG PO TABS: 10.0000 mg | ORAL_TABLET | Freq: Every day | ORAL | 1 refills | Status: DC

## 2017-04-30 MED ORDER — MONTELUKAST SODIUM 10 MG PO TABS: 10.0000 mg | ORAL_TABLET | Freq: Every day | ORAL | 3 refills | Status: DC

## 2017-04-30 NOTE — Patient Instructions (Addendum)
Mr. Sloane,  I don't think that antibiotics will help this flare of your chronic sinus disease with only 3-4 days of symptoms.   Please start nasal saline flushes before doing the nasal spray. Continue nasal spray 2 sprays twice daily.   Continue the loratadine 10 mg daily and singulair 10 mg nightly. This should help with allergies.  You should get a call about the ear, nose, and throat referral within a week.  Best, Dr. Ola Spurr

## 2017-04-30 NOTE — Progress Notes (Signed)
[name] Progress Note  Subjective:  [name] is a 51 y.o. male with history of arthritis, HTN, migraine, gout, obesity and PAF who presents for sinus pain.   #Sinus pain: - Patient reports he has had problems with nasal congestion and his sinuses for many years. Before developing afib, he would take medication to dry up his sinuses (e.g., sudafed) and would do okay. He can no longer take certain medications due to being on tikosyn and has been having issues with his sinuses most of the time but with flares. - Current flare present for about 3-4 days.  - He last took antibiotics (doxycycline due to penicillin allergy) in February of this year and is unsure if it helped much. He does associate the doxycycline with getting dizzy, though. - He has not had fevers but has had frontal headache.  - Using flonase 2 sprays BID most days. Also taking claritin daily. - Symptoms usually worse in fall and spring.  - Says he walked into gym's steam room and smelled mold the other day and is worried that could be related.  ROS: Occasional cough, no SOB, no fevers  Social: Former smoker  Allergies  Allergen Reactions  . Hydrochlorothiazide-Propranolol [Propranolol-Hctz] Other (See Comments)    Joints lock up  . Penicillins Other (See Comments)    Hives Makes joints stiff and unable to move Has patient had a PCN reaction causing immediate rash, facial/tongue/throat swelling, SOB or lightheadedness with hypotension: No Has patient had a PCN reaction causing severe rash involving mucus membranes or skin necrosis: No Has patient had a PCN reaction that required hospitalization No Has patient had a PCN reaction occurring within the last 10 years: No If all of the above answers are "NO", then may proceed with Cephalosporin use.    Objective: Blood pressure 130/90, pulse 60, temperature 97.9 F (36.6 C), temperature source Oral, height 6\' 2"  (1.88 m), weight (!) 311 lb 9.6 oz (141.3  kg), SpO2 96 %. Body mass index is 40.01 kg/m. Constitutional: Obese male in NAD, pleasant HENT: Nasal congestion present with mild erythema of nasal turbinates. R TM slightly erythematous but not bulging, mild erythema of posterior oropharynx. Tenderness with percussion of L maxillary and bilateral frontal sinuses. Slight erythema of conjunctivae.  Cardiovascular: RRR, S1, S2, no m/r/g.  Pulmonary/Chest: Effort normal and breath sounds normal. No respiratory distress.  Neurological: AOx3, no focal deficits. Skin: Skin is warm and dry. No rash noted.   Psychiatric: Normal mood and affect.  Vitals reviewed  Extensive pan sinus disease noted on MRI brain 01/23/17.   Assessment/Plan: Sinusitis - Chronic with suspected flare 2/2 allergies. Has sinus tenderness to percussion and nasal congestion without signs of infection (symptoms < 10 days, no fever, no rhinorrhea).  - Precepted with Drs. Mingo Amber and [place]. Recommended flushing with nasal saline prior to using flonase.  - Will start singulair - Continue claritin  - Advised against antibiotics at this time, as worsening of symptoms just began - Will refer to ENT for discussion of further treatment options, as patient tired of ongoing symptoms and fears sinus issues could be a trigger of his afib  Follow-up if current symptoms worsen/persist for more than 10 days.  Olene Floss, MD [place], PGY-3

## 2017-04-30 NOTE — Assessment & Plan Note (Addendum)
-   Chronic with suspected flare 2/2 allergies. Has sinus tenderness to percussion and nasal congestion without signs of infection (symptoms < 10 days, no fever, no rhinorrhea).  - Precepted with Drs. Mingo Amber and [place]. Recommended flushing with nasal saline prior to using flonase.  - Will start singulair - Continue claritin  - Advised against antibiotics at this time, as worsening of symptoms just began - Will refer to ENT for discussion of further treatment options, as patient tired of ongoing symptoms and fears sinus issues could be a trigger of his afib

## 2017-05-09 DIAGNOSIS — J329 Chronic sinusitis, unspecified: Secondary | ICD-10-CM | POA: Diagnosis not present

## 2017-07-12 ENCOUNTER — Other Ambulatory Visit: Payer: Self-pay | Admitting: Internal Medicine

## 2017-07-21 ENCOUNTER — Ambulatory Visit: Payer: 59 | Admitting: Cardiovascular Disease

## 2017-08-18 ENCOUNTER — Other Ambulatory Visit: Payer: Self-pay | Admitting: Family Medicine

## 2017-08-19 ENCOUNTER — Other Ambulatory Visit: Payer: Self-pay | Admitting: Family Medicine

## 2017-08-19 NOTE — Telephone Encounter (Signed)
Pt needs refill on Eliquis. His pharmacy said they have faxed over a request a couple of times

## 2017-08-20 DIAGNOSIS — M79672 Pain in left foot: Secondary | ICD-10-CM | POA: Diagnosis not present

## 2017-08-20 DIAGNOSIS — M7732 Calcaneal spur, left foot: Secondary | ICD-10-CM | POA: Diagnosis not present

## 2017-08-20 DIAGNOSIS — M19072 Primary osteoarthritis, left ankle and foot: Secondary | ICD-10-CM | POA: Diagnosis not present

## 2017-08-20 DIAGNOSIS — M5432 Sciatica, left side: Secondary | ICD-10-CM | POA: Diagnosis not present

## 2017-08-21 ENCOUNTER — Telehealth (HOSPITAL_COMMUNITY): Payer: Self-pay | Admitting: *Deleted

## 2017-08-21 MED ORDER — DILTIAZEM HCL 30 MG PO TABS: ORAL_TABLET | ORAL | 1 refills | Status: DC

## 2017-08-21 NOTE — Telephone Encounter (Signed)
Patient called in stating yesterday he went to urgent care and received steroid shot as well as prednisone taper pack for "heel spur." After leaving urgent care his HR shot up in the 180s and continued being elevated during the night. This morning he reports his HR is in the 80-100 range but he doesn't feel as bad other than some shortness of breath. He is not sure if he is in afib. Discussed with Roderic Palau NP will try cardizem 30mg  PRN for elevated HR as pts heart rate is usually in the 50-60s. Offered pt appt tomorrow but pt states he would rather call in the morning with his HR and if its still elevated come in next week.

## 2017-08-25 ENCOUNTER — Ambulatory Visit: Payer: 59 | Admitting: Internal Medicine

## 2017-08-26 ENCOUNTER — Telehealth: Payer: Self-pay

## 2017-08-26 NOTE — Telephone Encounter (Signed)
Patient states that he has picked up his RX for Eliquis on 08/24/2017. Request from pharmacy must have gotten crossed.[name], CMA

## 2017-09-08 ENCOUNTER — Encounter: Payer: Self-pay | Admitting: Internal Medicine

## 2017-09-08 ENCOUNTER — Other Ambulatory Visit: Payer: Self-pay

## 2017-09-08 ENCOUNTER — Ambulatory Visit (INDEPENDENT_AMBULATORY_CARE_PROVIDER_SITE_OTHER): Payer: 59 | Admitting: Internal Medicine

## 2017-09-08 VITALS — BP 134/82 | HR 66 | Temp 98.6°F | Ht 74.0 in | Wt 315.6 lb

## 2017-09-08 DIAGNOSIS — I10 Essential (primary) hypertension: Secondary | ICD-10-CM

## 2017-09-08 DIAGNOSIS — M109 Gout, unspecified: Secondary | ICD-10-CM

## 2017-09-08 DIAGNOSIS — I480 Paroxysmal atrial fibrillation: Secondary | ICD-10-CM | POA: Diagnosis not present

## 2017-09-08 DIAGNOSIS — Z1211 Encounter for screening for malignant neoplasm of colon: Secondary | ICD-10-CM

## 2017-09-08 DIAGNOSIS — Z0000 Encounter for general adult medical examination without abnormal findings: Secondary | ICD-10-CM | POA: Diagnosis not present

## 2017-09-08 DIAGNOSIS — M722 Plantar fascial fibromatosis: Secondary | ICD-10-CM | POA: Diagnosis not present

## 2017-09-08 MED ORDER — COLCHICINE 0.6 MG PO CAPS: 1.0000 | ORAL_CAPSULE | Freq: Every day | ORAL | 2 refills | Status: DC

## 2017-09-08 MED ORDER — BENZONATATE 100 MG PO CAPS: 100.0000 mg | ORAL_CAPSULE | Freq: Two times a day (BID) | ORAL | 0 refills | Status: DC | PRN

## 2017-09-08 MED ORDER — COLCHICINE 0.6 MG PO CAPS: 1.0000 | ORAL_CAPSULE | ORAL | 2 refills | Status: DC

## 2017-09-08 MED ORDER — OMEPRAZOLE 20 MG PO CPDR: 20.0000 mg | DELAYED_RELEASE_CAPSULE | Freq: Two times a day (BID) | ORAL | 5 refills | Status: DC

## 2017-09-08 MED ORDER — MONTELUKAST SODIUM 10 MG PO TABS: 10.0000 mg | ORAL_TABLET | Freq: Every day | ORAL | 3 refills | Status: DC

## 2017-09-08 NOTE — Progress Notes (Signed)
[name] Progress Note  Subjective:  [name] is a 52 y.o. male with history of obesity, gout, OSA, HTN, DJD, paroxysmal afib, and chronic sinusitis who presents for check-up.  Concerns today include:  - wanting to try medication for gout -- could not tolerate allopurinol (said it made him drowsy) and colchicine not covered by insurance. He reports pain flares in his right wrist and feet but no current redness or swelling.  - has been struggling with left heel pain. Seen by urgent care for this and was told he had heel spurs. Some improvement after course of steroids, though this increased his weight.  Chronic sinusitis: Improvement with singulair and nasal spray. Wants refill singulair. Requesting medication for cough.   PAF: to be started on tikosyn; managed by Cardiology. On eliquis.   HM: Due for conoloscopy.  Social: Has been going to gym and using elliptical; quit smoking over 20 years ago; does not drink, does not use illegal drugs; no mood concerns  ROS: No chest pain, no fevers; reports some urinary urgency but drinks a lot of water; otherwise negative  Allergies  Allergen Reactions  . Hydrochlorothiazide-Propranolol [Propranolol-Hctz] Other (See Comments)    Joints lock up  . Penicillins Other (See Comments)    Hives Makes joints stiff and unable to move Has patient had a PCN reaction causing immediate rash, facial/tongue/throat swelling, SOB or lightheadedness with hypotension: No Has patient had a PCN reaction causing severe rash involving mucus membranes or skin necrosis: No Has patient had a PCN reaction that required hospitalization No Has patient had a PCN reaction occurring within the last 10 years: No If all of the above answers are "NO", then may proceed with Cephalosporin use.    Social History   Tobacco Use  . Smoking status: Former Smoker    Packs/day: 1.00    Years: 10.00    Pack years: 10.00    Types: Cigarettes    Last attempt to  quit: 04/02/1994    Years since quitting: 23.4  . Smokeless tobacco: Former Systems developer    Types: Chew  . Tobacco comment: "chewed in my teens"  Substance Use Topics  . Alcohol use: No    Alcohol/week: 0.0 oz    Comment: "stopped in 1994"    Objective: Blood pressure 134/82, pulse 66, temperature 98.6 F (37 C), temperature source Oral, height 6\' 2"  (1.88 m), weight (!) 315 lb 9.6 oz (143.2 kg), SpO2 95 %. Body mass index is 40.52 kg/m. Constitutional: Obese male, pleasant in NAD HENT: MMM, mild nasal congestion Cardiovascular: RRR, S1, S2, no m/r/g.  Pulmonary/Chest: Effort normal and breath sounds normal.  Abdominal: Soft. +BS, NT Musculoskeletal: TTP over left heel. Normal dorsal and plantarflexion bilaterally. Neurological: AOx3, no focal deficits. Skin: Skin is warm and dry. No rash noted.  Psychiatric: Normal mood and affect.  Vitals reviewed  Assessment/Plan: HYPERTENSION, BENIGN SYSTEMIC - Well controlled. Not on antihypertensives.  - Had recent lipid panel 03/2017 that showed improvement in LDL. Continue atorvastatin.   PAF (paroxysmal atrial fibrillation) ([place]) - To start tikosyn.  Plantar fasciitis of left foot - Suggested heel cup for shoes. Consider sports medicine referral if not improving.   Morbid obesity - Patient has started to incorporate regular exercise into his routine.   Gout - Last full flare in July of last year. Mitigare covered by his insurance. Will try 0.6 mg every other day, as he is on statin which could increase risk of muscle cramps and patient tends  to be sensitive to medication. - check uric acid level at next visit; if low, continue treatment for another 3-6 months  Healthcare maintenance - Ordered colonoscopy  Ordered tessalon perles to try for cough. Lungs CTAB and no fever. Likely post-nasal drip but limited in therapies due to afib.  Follow-up in at least 3 months to see if mitigare helping with joint pain.  Olene Floss,  MD [place], PGY-3

## 2017-09-08 NOTE — Patient Instructions (Signed)
[name],  I have refilled singulair.   I have filled mitigare for possible gout. I prescribed this as once every other day to decrease risk of side effects (muscle cramping). Please follow-up in about 3 months to see if you've had improvement and to check uric acid levels.  Try a heel cup in your work shoes for heel pain.   Try tessalon perles for cough.  I will refer you for colonoscopy.  Best, Dr. Ola Spurr

## 2017-09-12 ENCOUNTER — Encounter: Payer: Self-pay | Admitting: Internal Medicine

## 2017-09-12 DIAGNOSIS — Z0000 Encounter for general adult medical examination without abnormal findings: Secondary | ICD-10-CM | POA: Insufficient documentation

## 2017-09-12 NOTE — Assessment & Plan Note (Signed)
-   Last full flare in July of last year. Mitigare covered by his insurance. Will try 0.6 mg every other day, as he is on statin which could increase risk of muscle cramps and patient tends to be sensitive to medication. - check uric acid level at next visit; if low, continue treatment for another 3-6 months

## 2017-09-12 NOTE — Assessment & Plan Note (Signed)
Ordered colonoscopy

## 2017-09-12 NOTE — Assessment & Plan Note (Signed)
-   Well controlled. Not on antihypertensives.  - Had recent lipid panel 03/2017 that showed improvement in LDL. Continue atorvastatin.

## 2017-09-12 NOTE — Assessment & Plan Note (Signed)
>>  ASSESSMENT AND PLAN FOR HEALTHCARE MAINTENANCE WRITTEN ON 09/12/2017  2:22 PM BY FITZGERALD, Chrisandra Netters, MD  - Ordered colonoscopy

## 2017-09-12 NOTE — Assessment & Plan Note (Signed)
-   Patient has started to incorporate regular exercise into his routine.

## 2017-09-12 NOTE — Assessment & Plan Note (Signed)
-   Suggested heel cup for shoes. Consider sports medicine referral if not improving.

## 2017-09-12 NOTE — Assessment & Plan Note (Signed)
-   To start tikosyn.

## 2017-09-22 DIAGNOSIS — G4733 Obstructive sleep apnea (adult) (pediatric): Secondary | ICD-10-CM | POA: Diagnosis not present

## 2017-10-15 DIAGNOSIS — S46812A Strain of other muscles, fascia and tendons at shoulder and upper arm level, left arm, initial encounter: Secondary | ICD-10-CM | POA: Diagnosis not present

## 2017-11-17 ENCOUNTER — Other Ambulatory Visit: Payer: Self-pay | Admitting: Family Medicine

## 2017-11-19 ENCOUNTER — Other Ambulatory Visit: Payer: Self-pay

## 2017-11-20 MED ORDER — LORATADINE 10 MG PO TABS: 10.0000 mg | ORAL_TABLET | Freq: Every day | ORAL | 1 refills | Status: DC

## 2017-11-20 NOTE — Telephone Encounter (Signed)
Please ask patient to schedule follow up visit with me, thanks Lazette Estala J Asees Manfredi, MD  

## 2017-11-20 NOTE — Telephone Encounter (Signed)
LMOVM for pt to call back and schedule an appt with dr Elenor Quinones. Ayan Yankey Kennon Holter, CMA

## 2017-11-24 ENCOUNTER — Other Ambulatory Visit: Payer: Self-pay

## 2017-11-26 ENCOUNTER — Telehealth: Payer: Self-pay

## 2017-11-26 NOTE — Telephone Encounter (Signed)
Called patient to schedule an appointment per Dr. Ardelia Mems and he informed me that he already has one.  [name], [name]

## 2017-11-26 NOTE — Telephone Encounter (Signed)
Appt scheduled 01/02/2018. Deseree Kennon Holter, CMA

## 2017-11-27 DIAGNOSIS — R9439 Abnormal result of other cardiovascular function study: Secondary | ICD-10-CM | POA: Insufficient documentation

## 2017-11-27 DIAGNOSIS — S96912A Strain of unspecified muscle and tendon at ankle and foot level, left foot, initial encounter: Secondary | ICD-10-CM | POA: Diagnosis not present

## 2017-11-27 DIAGNOSIS — M7732 Calcaneal spur, left foot: Secondary | ICD-10-CM | POA: Diagnosis not present

## 2017-11-27 DIAGNOSIS — R253 Fasciculation: Secondary | ICD-10-CM | POA: Insufficient documentation

## 2017-11-27 DIAGNOSIS — M79672 Pain in left foot: Secondary | ICD-10-CM | POA: Diagnosis not present

## 2018-01-02 ENCOUNTER — Ambulatory Visit
Admission: RE | Admit: 2018-01-02 | Discharge: 2018-01-02 | Disposition: A | Discharge status: Home / Self Care | Payer: 59 | Source: Ambulatory Visit | Attending: Family Medicine | Admitting: Family Medicine

## 2018-01-02 ENCOUNTER — Ambulatory Visit: Payer: 59 | Admitting: Family Medicine

## 2018-01-02 ENCOUNTER — Ambulatory Visit (HOSPITAL_COMMUNITY)
Admission: RE | Admit: 2018-01-02 | Discharge: 2018-01-02 | Disposition: A | Discharge status: Home / Self Care | Payer: 59 | Source: Ambulatory Visit | Attending: Family Medicine | Admitting: Family Medicine

## 2018-01-02 VITALS — BP 125/80 | HR 61 | Temp 98.1°F | Ht 74.0 in | Wt 310.0 lb

## 2018-01-02 DIAGNOSIS — I480 Paroxysmal atrial fibrillation: Secondary | ICD-10-CM | POA: Diagnosis not present

## 2018-01-02 DIAGNOSIS — M25552 Pain in left hip: Secondary | ICD-10-CM | POA: Diagnosis not present

## 2018-01-02 DIAGNOSIS — R1032 Left lower quadrant pain: Secondary | ICD-10-CM

## 2018-01-02 DIAGNOSIS — Z9989 Dependence on other enabling machines and devices: Secondary | ICD-10-CM | POA: Diagnosis not present

## 2018-01-02 DIAGNOSIS — R079 Chest pain, unspecified: Secondary | ICD-10-CM | POA: Diagnosis not present

## 2018-01-02 DIAGNOSIS — G8929 Other chronic pain: Secondary | ICD-10-CM

## 2018-01-02 DIAGNOSIS — R32 Unspecified urinary incontinence: Secondary | ICD-10-CM | POA: Insufficient documentation

## 2018-01-02 DIAGNOSIS — Z7901 Long term (current) use of anticoagulants: Secondary | ICD-10-CM | POA: Diagnosis not present

## 2018-01-02 DIAGNOSIS — J309 Allergic rhinitis, unspecified: Secondary | ICD-10-CM

## 2018-01-02 DIAGNOSIS — R001 Bradycardia, unspecified: Secondary | ICD-10-CM | POA: Insufficient documentation

## 2018-01-02 DIAGNOSIS — R42 Dizziness and giddiness: Secondary | ICD-10-CM

## 2018-01-02 DIAGNOSIS — M25561 Pain in right knee: Secondary | ICD-10-CM

## 2018-01-02 DIAGNOSIS — G4733 Obstructive sleep apnea (adult) (pediatric): Secondary | ICD-10-CM

## 2018-01-02 HISTORY — DX: Left lower quadrant pain: R10.32

## 2018-01-02 HISTORY — DX: Unspecified urinary incontinence: R32

## 2018-01-02 HISTORY — DX: Allergic rhinitis, unspecified: J30.9

## 2018-01-02 MED ORDER — LORATADINE 10 MG PO TABS: 10.0000 mg | ORAL_TABLET | Freq: Every day | ORAL | 1 refills | Status: DC | PRN

## 2018-01-02 MED ORDER — METHYLPREDNISOLONE ACETATE 40 MG/ML IJ SUSP
40.0000 mg | Freq: Once | INTRAMUSCULAR | Status: AC
  Administered 2018-01-02: 40 mg via INTRA_ARTICULAR

## 2018-01-02 NOTE — Assessment & Plan Note (Signed)
Refill loratadine today for as needed use

## 2018-01-02 NOTE — Assessment & Plan Note (Signed)
Had incontinence several months ago, none recently. Not able to fully address this problem during today's visit due to multiple other issues addressed. Check PSA with labs today, patient will follow up with me at his earliest convenience to continue addressing this issue. At next visit would check UA and perform digital prostate examination. May need to see urology eventually.

## 2018-01-02 NOTE — Assessment & Plan Note (Signed)
Patient endorsing chest pain followed by lightheadedness/presyncope feelings at work. Has not thought to contact his cardiologist about this. Chest pain reproducible on palpation. EKG done today which is largely unchanged from prior. Orthostatics negative here in clinic. Has prior clean cath in the last few years, doubt ischemic etiology but he does have afib and is on tikosyn. Asked him to schedule an appointment with cardiologist ASAP, may benefit from prolonged cardiac monitoring for arrhythmia.

## 2018-01-02 NOTE — Assessment & Plan Note (Signed)
Knee injected today for acute flare of pain. Tolerated well with improvement in pain after injection. Encouraged him to follow up with orthopedics if he wants to pursue joint replacement.

## 2018-01-02 NOTE — Assessment & Plan Note (Signed)
Check CBC today for monitoring on eliquis.

## 2018-01-02 NOTE — Patient Instructions (Addendum)
For your hip: -Checking xray -Try tylenol  For your R knee: -Injection today -Follow up with orthopedist if you decide you want knee replacement  For dizziness/feeling like you're going to pass out: -EKG looks ok -Blood pressures were fine -Call cardiologist to schedule appointment, may need to wear heart monitor -If you have any chest pain that does not go away within 30 minutes, is accompanied by nausea, sweating, shortness of breath, or made worse by activity, go to the emergency room immediately for evaluation.   For allergies: -Refilled loratadine  Checking labwork  Follow up with me at next available appointment to continue discussing these issues.  Be well, Dr. Ardelia Mems

## 2018-01-02 NOTE — Assessment & Plan Note (Signed)
Unclear etiology. No hernias appreciated on valsalva and no reports of bulging. Will check xray of left hip to eval for hip joint arthritis. Can continue discussing at next visit. Recommend trying tylenol for pain.

## 2018-01-02 NOTE — Assessment & Plan Note (Signed)
Compliant with nightly CPAP

## 2018-01-02 NOTE — Progress Notes (Signed)
Date of Visit: 01/02/2018   HPI:  Patient presents for routine follow up, after I asked him to schedule an appointment. Last visit with me was October 2017. He has many issues he wants to discuss today, which we were not able to address fully given time constraints in the visit. He will schedule a follow up appointment shortly to continue addressing issues.  Afib - taking tikosyn, followed by cardiology. Also takes eliquis twice daily. Tolerating well. No bleeding. Does endorse recent dizziness and feeling like he's going to pass out at work. Has sharp left sided chest pain and then gets dizzy. Has not actually passed out.  Right knee pain - has known OA in R knee, previously saw ortho who discussed with patient getting a knee replacement. He is not ready for this at this time. Pain flared up on Saturday. He would like a knee injection today.  Left groin pain - having pain in left groin/inguinal area for 3-4 months. Mostly occurs in mornings, better if he drinks water. Lasts 3-4 hours at a time, happening every morning. Worse with lifting heavy things. Has not noticed any bulges in inguinal area or abdomen. Denies problems with testicles or scrotum.  Allergies - requests refill of loratadine, helps him, takes it as needed   Urine - Wife is present and wants to know if he should have his prostate checked. No known family history of prostate cancer or other cancers. Has not had PSA checked before. Back in the winter time he had some urinary incontinence, which is now resolved, has not had any incontinence in months.   Back pain - Does endorse some left sided low back pain which he brought up at end of visit. Denies having fever, saddle anesthesia, lower extremity weakness, or problems with stooling or urination at present.   OSA - uses CPAP nightly.  ROS: See HPI.  [place]: history of gout, afib, hyperlipidemia, GERD, morbid obesity, hypertension, OSA  PHYSICAL EXAM: BP 125/80 (BP Location: Left  Arm, Patient Position: Sitting, Cuff Size: Large)   Pulse 61   Temp 98.1 F (36.7 C) (Oral)   Ht 6\' 2"  (1.88 m)   Wt (!) 310 lb (140.6 kg)   SpO2 96%   BMI 39.80 kg/m  Gen: no acute distress, pleasant, cooperative HEENT: normocephalic, atraumatic, moist mucous membranes  Heart: regular rate and rhythm, no murmur Lungs: clear to auscultation bilaterally, normal work of breathing  Neuro: alert, grossly nonfocal, speech normal Ext: No appreciable lower extremity edema bilaterally. R knee with crepitus on flexion & extension. Full strenght bilateral lower extremities. No knee effusion, warmth, or erythema appreciated. Groin: left groin mildly tender to palpation. No masses palpable with valsalva. No inguinal lymphadenopathy appreciated. No pain with hip internal/exernal rotation.  PROCEDURE NOTE:  After informed written consent was obtained, patient was seated on exam table. R knee was prepped with alcohol swab. Utilizing lateral approach, patient's left knee was injected intraarticularly with mixture of 1cc of depomedrol 40mg /mL and 4cc of 1% lidocaine without epinephrine. Patient tolerated the procedure well without immediate complications.     ASSESSMENT/PLAN:  Health maintenance:  -current on HM items  OSA on CPAP Compliant with nightly CPAP  Chest pain Patient endorsing chest pain followed by lightheadedness/presyncope feelings at work. Has not thought to contact his cardiologist about this. Chest pain reproducible on palpation. EKG done today which is largely unchanged from prior. Orthostatics negative here in clinic. Has prior clean cath in the last few years, doubt ischemic etiology  but he does have afib and is on [place]. Asked him to schedule an appointment with cardiologist ASAP, may benefit from prolonged cardiac monitoring for arrhythmia.   Atrial fibrillation ([place]) Check CBC today for monitoring on eliquis.  Right knee pain Knee injected today for acute flare of pain.  Tolerated well with improvement in pain after injection. Encouraged him to follow up with orthopedics if he wants to pursue joint replacement.  Urinary incontinence Had incontinence several months ago, none recently. Not able to fully address this problem during today's visit due to multiple other issues addressed. Check PSA with labs today, patient will follow up with me at his earliest convenience to continue addressing this issue. At next visit would check UA and perform digital prostate examination. May need to see urology eventually.  Left groin pain Unclear etiology. No hernias appreciated on valsalva and no reports of bulging. Will check xray of left hip to eval for hip joint arthritis. Can continue discussing at next visit. Recommend trying tylenol for pain.  Allergic rhinitis Refill loratadine today for as needed use   Back pain - could not address in detail today. Confirmed no red flag symptoms at present. Address at next visit.  FOLLOW UP: Follow up at next available appointment for urinary issues, back pain, groin pain. Schedule follow up with cardiology.  [place]. Ardelia Mems, [place]

## 2018-01-03 LAB — CBC
Hematocrit: 41.9 % (ref 37.5–51.0)
Hemoglobin: 13.4 g/dL (ref 13.0–17.7)
MCH: 26.8 pg (ref 26.6–33.0)
MCHC: 32 g/dL (ref 31.5–35.7)
MCV: 84 fL (ref 79–97)
Platelets: 235 10*3/uL (ref 150–450)
RBC: 5 x10E6/uL (ref 4.14–5.80)
RDW: 14.1 % (ref 12.3–15.4)
WBC: 8.2 10*3/uL (ref 3.4–10.8)

## 2018-01-03 LAB — CMP14+EGFR
ALK PHOS: 84 IU/L (ref 39–117)
ALT: 23 IU/L (ref 0–44)
AST: 26 IU/L (ref 0–40)
Albumin/Globulin Ratio: 1.6 (ref 1.2–2.2)
Albumin: 4.6 g/dL (ref 3.5–5.5)
BILIRUBIN TOTAL: 0.4 mg/dL (ref 0.0–1.2)
BUN/Creatinine Ratio: 12 (ref 9–20)
BUN: 13 mg/dL (ref 6–24)
CHLORIDE: 105 mmol/L (ref 96–106)
CO2: 24 mmol/L (ref 20–29)
Calcium: 9.9 mg/dL (ref 8.7–10.2)
Creatinine, Ser: 1.07 mg/dL (ref 0.76–1.27)
GFR calc Af Amer: 91 mL/min/{1.73_m2} (ref >59)
GFR calc non Af Amer: 79 mL/min/{1.73_m2} (ref >59)
GLUCOSE: 92 mg/dL (ref 65–99)
Globulin, Total: 2.9 g/dL (ref 1.5–4.5)
Potassium: 4.6 mmol/L (ref 3.5–5.2)
Sodium: 145 mmol/L — ABNORMAL HIGH (ref 134–144)
Total Protein: 7.5 g/dL (ref 6.0–8.5)

## 2018-01-03 LAB — PSA: Prostate Specific Ag, Serum: 0.7 ng/mL (ref 0.0–4.0)

## 2018-01-09 ENCOUNTER — Encounter: Payer: Self-pay | Admitting: Family Medicine

## 2018-01-13 ENCOUNTER — Other Ambulatory Visit: Payer: Self-pay | Admitting: Nurse Practitioner

## 2018-01-13 DIAGNOSIS — R102 Pelvic and perineal pain: Secondary | ICD-10-CM

## 2018-01-14 ENCOUNTER — Ambulatory Visit
Admission: RE | Admit: 2018-01-14 | Discharge: 2018-01-14 | Disposition: A | Discharge status: Home / Self Care | Payer: 59 | Source: Ambulatory Visit | Attending: Orthopedic Surgery | Admitting: Orthopedic Surgery

## 2018-01-14 DIAGNOSIS — R102 Pelvic and perineal pain: Secondary | ICD-10-CM

## 2018-01-14 DIAGNOSIS — K429 Umbilical hernia without obstruction or gangrene: Secondary | ICD-10-CM | POA: Diagnosis not present

## 2018-01-14 MED ORDER — IOPAMIDOL (ISOVUE-300) INJECTION 61%
125.0000 mL | Freq: Once | INTRAVENOUS | Status: AC | PRN
  Administered 2018-01-14: 125 mL via INTRAVENOUS

## 2018-01-18 ENCOUNTER — Other Ambulatory Visit: Payer: Self-pay | Admitting: Internal Medicine

## 2018-01-19 ENCOUNTER — Other Ambulatory Visit: Payer: Self-pay

## 2018-02-16 ENCOUNTER — Other Ambulatory Visit: Payer: Self-pay | Admitting: Family Medicine

## 2018-03-02 ENCOUNTER — Telehealth (HOSPITAL_COMMUNITY): Payer: Self-pay | Admitting: *Deleted

## 2018-03-02 ENCOUNTER — Telehealth: Payer: Self-pay | Admitting: *Deleted

## 2018-03-02 DIAGNOSIS — G4733 Obstructive sleep apnea (adult) (pediatric): Secondary | ICD-10-CM

## 2018-03-02 NOTE — Telephone Encounter (Signed)
Pts CPAP machine is broken.  He took it to the medical supply store, but they said it has been long enough that in order to get a new machine, he will need a new sleep study.   Pt request a referral ASAP because he suffers from Afib. Please mark as urgent if you agree.  Will forward to MD.  [place], Salome Spotted, [place]

## 2018-03-02 NOTE — Telephone Encounter (Signed)
Order entered, placed "please schedule ASAP" in comment section. Please let patient know I have ordered this and someone from the sleep center should call him to schedule.  Thanks, [name], MD

## 2018-03-02 NOTE — Telephone Encounter (Signed)
Called patient and told him that an order was placed for a sleep study and that he should be getting a call soon with a date and time.  Patient really anxious because he stopped breathing again last night and the machine he currently has does not reflect this.  [name], [place]

## 2018-03-02 NOTE — Telephone Encounter (Signed)
Pt called in this morning stating he was in AFib with HR in the 115 range. Instructed to take a 30mg  of cardizem - can repeat in 4 hours if still in afib. Pt states his CPAP machine is broken and he felt he was having more apnea at night due to this. States he is in the process of getting a new machine. Told pt he may have more breakthrough afib while not using CPAP machine. Pt states he is calling about getting a new machine asap.

## 2018-03-04 DIAGNOSIS — Z719 Counseling, unspecified: Secondary | ICD-10-CM | POA: Diagnosis not present

## 2018-03-18 DIAGNOSIS — Z719 Counseling, unspecified: Secondary | ICD-10-CM | POA: Diagnosis not present

## 2018-03-21 ENCOUNTER — Emergency Department (HOSPITAL_BASED_OUTPATIENT_CLINIC_OR_DEPARTMENT_OTHER): Payer: No Typology Code available for payment source

## 2018-03-21 ENCOUNTER — Encounter (HOSPITAL_BASED_OUTPATIENT_CLINIC_OR_DEPARTMENT_OTHER): Payer: Self-pay | Admitting: Emergency Medicine

## 2018-03-21 ENCOUNTER — Emergency Department (HOSPITAL_BASED_OUTPATIENT_CLINIC_OR_DEPARTMENT_OTHER)
Admission: EM | Admit: 2018-03-21 | Discharge: 2018-03-21 | Disposition: A | Discharge status: Home / Self Care | Payer: No Typology Code available for payment source | Attending: Emergency Medicine | Admitting: Emergency Medicine

## 2018-03-21 ENCOUNTER — Other Ambulatory Visit: Payer: Self-pay

## 2018-03-21 DIAGNOSIS — S0990XA Unspecified injury of head, initial encounter: Secondary | ICD-10-CM | POA: Diagnosis present

## 2018-03-21 DIAGNOSIS — R0789 Other chest pain: Secondary | ICD-10-CM | POA: Diagnosis not present

## 2018-03-21 DIAGNOSIS — Y929 Unspecified place or not applicable: Secondary | ICD-10-CM | POA: Insufficient documentation

## 2018-03-21 DIAGNOSIS — Z79899 Other long term (current) drug therapy: Secondary | ICD-10-CM | POA: Diagnosis not present

## 2018-03-21 DIAGNOSIS — I10 Essential (primary) hypertension: Secondary | ICD-10-CM | POA: Insufficient documentation

## 2018-03-21 DIAGNOSIS — Z7901 Long term (current) use of anticoagulants: Secondary | ICD-10-CM | POA: Diagnosis not present

## 2018-03-21 DIAGNOSIS — Y939 Activity, unspecified: Secondary | ICD-10-CM | POA: Insufficient documentation

## 2018-03-21 DIAGNOSIS — Z87891 Personal history of nicotine dependence: Secondary | ICD-10-CM | POA: Insufficient documentation

## 2018-03-21 DIAGNOSIS — Y990 Civilian activity done for income or pay: Secondary | ICD-10-CM | POA: Diagnosis not present

## 2018-03-21 DIAGNOSIS — W2209XA Striking against other stationary object, initial encounter: Secondary | ICD-10-CM | POA: Diagnosis not present

## 2018-03-21 LAB — CBC WITH DIFFERENTIAL/PLATELET
BASOS ABS: 0.1 10*3/uL (ref 0.0–0.1)
Basophils Relative: 1 %
Eosinophils Absolute: 0.3 10*3/uL (ref 0.0–0.7)
Eosinophils Relative: 4 %
HCT: 40.8 % (ref 39.0–52.0)
HEMOGLOBIN: 13.9 g/dL (ref 13.0–17.0)
LYMPHS ABS: 2.3 10*3/uL (ref 0.7–4.0)
LYMPHS PCT: 33 %
MCH: 28 pg (ref 26.0–34.0)
MCHC: 34.1 g/dL (ref 30.0–36.0)
MCV: 82.3 fL (ref 78.0–100.0)
Monocytes Absolute: 0.4 10*3/uL (ref 0.1–1.0)
Monocytes Relative: 6 %
NEUTROS PCT: 56 %
Neutro Abs: 4 10*3/uL (ref 1.7–7.7)
PLATELETS: 194 10*3/uL (ref 150–400)
RBC: 4.96 MIL/uL (ref 4.22–5.81)
RDW: 13.2 % (ref 11.5–15.5)
WBC: 7 10*3/uL (ref 4.0–10.5)

## 2018-03-21 LAB — COMPREHENSIVE METABOLIC PANEL
ALK PHOS: 73 U/L (ref 38–126)
ALT: 24 U/L (ref 0–44)
AST: 29 U/L (ref 15–41)
Albumin: 4.4 g/dL (ref 3.5–5.0)
Anion gap: 9 (ref 5–15)
BUN: 18 mg/dL (ref 6–20)
CALCIUM: 9.2 mg/dL (ref 8.9–10.3)
CHLORIDE: 104 mmol/L (ref 98–111)
CO2: 27 mmol/L (ref 22–32)
CREATININE: 1.06 mg/dL (ref 0.61–1.24)
GFR calc Af Amer: >60 mL/min (ref >60)
GFR calc non Af Amer: >60 mL/min (ref >60)
GLUCOSE: 148 mg/dL — AB (ref 70–99)
Potassium: 4.1 mmol/L (ref 3.5–5.1)
SODIUM: 140 mmol/L (ref 135–145)
Total Bilirubin: 0.7 mg/dL (ref 0.3–1.2)
Total Protein: 7.7 g/dL (ref 6.5–8.1)

## 2018-03-21 LAB — TROPONIN I
Troponin I: <0.03 ng/mL (ref <0.03)
Troponin I: <0.03 ng/mL (ref <0.03)

## 2018-03-21 NOTE — ED Triage Notes (Addendum)
Patient states that he was at work when he hit his head. Denies any LOC  - patient is on blood thinner  - patient states that he is dizzy and has blurred vision

## 2018-03-21 NOTE — ED Provider Notes (Signed)
[facility] Provider Note   CSN: [id number] Arrival date & time: 03/21/18  1119     History   Chief Complaint Chief Complaint  Patient presents with  . Head Injury  . Chest Pain    HPI Holley Wirt is a 52 y.o. male.  HPI Patient has a history of atrial fibrillation and is on Eliquis.  States he was at work this morning around 730 and struck the top of his head on a beam.  No loss of consciousness.  States he had a persistent headache since the accident.  Denies visual changes, no speech changes.  Denies neck pain.  States he also began experiencing left-sided chest pain at the time of the accident.  Denies any chest trauma.  States that the pain radiated from the head down to his chest.  Denies shortness of breath.  No palpitations.  No new lower extremity swelling or pain.  No focal weakness or numbness. Past Medical History:  Diagnosis Date  . Arthritis    "knees" (01/22/2017)  . Chronic lower back pain   . GERD (gastroesophageal reflux disease)   . Gout    "no flares in awhile" (01/22/2017)  . History of MRI of cervical spine 09/25/10   normal; left shoulder normal as well  . Hypertension   . Migraine    "~ q 6 months" (01/22/2017)  . Morbid obesity ([place])    "I've lost 43# in 15 weeks w/weight watchers" (01/22/2017)  . OSA on CPAP   . Paroxysmal atrial fibrillation ([place]) 7/15   chads2vasc score is 1    Patient Active Problem List   Diagnosis Date Noted  . Urinary incontinence 01/02/2018  . Left groin pain 01/02/2018  . Allergic rhinitis 01/02/2018  . Healthcare maintenance 09/12/2017  . Acute nonintractable headache 02/03/2017  . Abnormal stress test   . Symptomatic sinus bradycardia   . Left sided numbness   . Muscle fasciculation   . Primary osteoarthritis of right knee 07/23/2016  . Plantar fasciitis of left foot 05/07/2016  . 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 Degenerative joint disease (DJD) of  lumbar spine 01/05/2016  . Acute gout   . Dizziness 03/21/2015  . Shortness of breath 03/21/2015  . OSA on CPAP   . PAF (paroxysmal atrial fibrillation) ([place])   . Palpitations   . Chest pain 03/03/2015  . Left knee pain 10/13/2014  . Atrial fibrillation ([place]) 03/14/2014  . Right knee pain 11/10/2012  . Sinusitis 10/23/2012  . Left hip pain 06/17/2011  . OSA (obstructive sleep apnea) 05/17/2011  . Chronic back pain 10/29/2010  . INSOMNIA 09/19/2008  . Gout 05/13/2007  . Hyperlipidemia 10/02/2006  . Morbid obesity ([place]) 10/02/2006  . Migraine 10/02/2006  . HYPERTENSION, BENIGN SYSTEMIC 10/02/2006  . GASTROESOPHAGEAL REFLUX, NO ESOPHAGITIS 10/02/2006    Past Surgical History:  Procedure Laterality Date  . CARDIAC CATHETERIZATION  05/2005   "no blockage"  . KNEE ARTHROSCOPY Right 02/2013  . LEFT HEART CATH AND CORONARY ANGIOGRAPHY N/A 01/27/2017   Procedure: Left Heart Cath and Coronary Angiography;  Surgeon: Troy Sine, MD;  Location: [facility];  Service: Cardiovascular;  Laterality: N/A;        Home Medications    Prior to Admission medications   Medication Sig Start Date End Date Taking? Authorizing Provider  apixaban (ELIQUIS) 5 MG TABS tablet TAKE ONE TABLET BY MOUTH TWICE DAILY 04/15/14  Yes [provider]  atorvastatin (LIPITOR) 20  MG tablet Take 1 tablet (20 mg total) by mouth daily. 03/31/17   Leanor Kail, PA  Colchicine (MITIGARE) 0.6 MG CAPS Take 1 capsule by mouth every other day. Patient not taking: Reported on 01/02/2018 09/08/17   Rogue Bussing, MD  diltiazem (CARDIZEM) 30 MG tablet Take 1 tablet every 4 hours AS NEEDED for elevated heart rate Patient not taking: Reported on 01/02/2018 08/21/17   Sherran Needs, NP  dofetilide (TIKOSYN) 500 MCG capsule TAKE 1 CAPSULE BY MOUTH TWICE DAILY 07/15/17   Allred, Jeneen Rinks, MD  ELIQUIS 5 MG TABS tablet TAKE 1 TABLET BY MOUTH TWICE DAILY 02/20/18   [name], MD  fluticasone  (FLONASE) 50 MCG/ACT nasal spray Place 1 spray into both nostrils daily.    [provider]  Hypromellose ([employer] GENTLE LUBRICANT EYE DROPS OP) Place 1 drop into both eyes daily as needed (dry eyes).    [provider]  loratadine (EQ ALLERGY RELIEF) 10 MG tablet Take 1 tablet (10 mg total) by mouth daily as needed. 01/02/18   [name], MD  montelukast (SINGULAIR) 10 MG tablet TAKE 1 TABLET BY MOUTH AT BEDTIME 01/26/18   [name], MD  Omega-3 Fatty Acids (FISH OIL) 500 MG CAPS Take 2 capsules by mouth.    [provider]  omeprazole (PRILOSEC) 20 MG capsule Take 1 capsule (20 mg total) by mouth 2 (two) times daily. 09/08/17   Rogue Bussing, MD  PRESCRIPTION MEDICATION Inhale into the lungs at bedtime. CPAP    [provider]  TURMERIC PO Take by mouth every other day.    [provider]    Family History Family History  Problem Relation Age of Onset  . Atrial fibrillation Mother 71       had a stroke    Social History Social History   Tobacco Use  . Smoking status: Former Smoker    Packs/day: 1.00    Years: 10.00    Pack years: 10.00    Types: Cigarettes    Last attempt to quit: 04/02/1994    Years since quitting: 23.9  . Smokeless tobacco: Former Systems developer    Types: Chew  . Tobacco comment: "chewed in my teens"  Substance Use Topics  . Alcohol use: No    Alcohol/week: 0.0 standard drinks    Comment: "stopped in 1994"  . Drug use: No    Types: Marijuana    Comment: "stopped marijuna in 1994"     Allergies   Hydrochlorothiazide-propranolol [propranolol-hctz] and Penicillins   Review of Systems Review of Systems  Constitutional: Negative for chills and fever.  HENT: Negative for facial swelling and sore throat.   Eyes: Negative for visual disturbance.  Respiratory: Negative for cough and shortness of breath.   Cardiovascular: Positive for chest pain. Negative for palpitations and leg swelling.    Gastrointestinal: Negative for abdominal pain, constipation, diarrhea, nausea and vomiting.  Genitourinary: Negative for flank pain, frequency and hematuria.  Musculoskeletal: Negative for back pain, myalgias and neck pain.  Skin: Negative for rash and wound.  Neurological: Positive for headaches. Negative for dizziness, syncope, weakness and light-headedness.  All other systems reviewed and are negative.    Physical Exam Updated Vital Signs BP 129/86   Pulse 67   Temp 98.5 F (36.9 C) (Oral)   Resp 18   Ht 6\' 2"  (1.88 m)   Wt (!) 141.5 kg   SpO2 100%   BMI 40.06 kg/m   Physical Exam  Constitutional:  He is oriented to person, place, and time. He appears well-developed and well-nourished. No distress.  HENT:  Head: Normocephalic and atraumatic.  Mouth/Throat: Oropharynx is clear and moist.  Mild tenderness to apex of the calvarium.  No obvious contusion or hematoma.  Midface is stable.  No intraoral trauma.  No hemotympanum  Eyes: Pupils are equal, round, and reactive to light. EOM are normal.  Neck: Normal range of motion. Neck supple.  No posterior midline cervical tenderness to palpation.  Cardiovascular: Normal rate and regular rhythm. Exam reveals no gallop and no friction rub.  No murmur heard. Pulmonary/Chest: Effort normal and breath sounds normal.  Abdominal: Soft. Bowel sounds are normal. There is no tenderness. There is no rebound and no guarding.  Musculoskeletal: Normal range of motion. He exhibits no edema or tenderness.  Neurological: He is alert and oriented to person, place, and time.  Skin: Skin is warm and dry. No rash noted. He is not diaphoretic. No erythema.  Psychiatric: He has a normal mood and affect. His behavior is normal.  Nursing note and vitals reviewed.    ED Treatments / Results  Labs (all labs ordered are listed, but only abnormal results are displayed) Labs Reviewed  COMPREHENSIVE METABOLIC PANEL - Abnormal; Notable for the following  components:      Result Value   Glucose, Bld 148 (*)    All other components within normal limits  CBC WITH DIFFERENTIAL/PLATELET  TROPONIN I  TROPONIN I    EKG EKG Interpretation  Date/Time:  Saturday March 21 2018 13:57:19 EDT Ventricular Rate:  62 PR Interval:    QRS Duration: 106 QT Interval:  429 QTC Calculation: 436 R Axis:   20 Text Interpretation:  Sinus rhythm ST elev, probable normal early repol pattern Confirmed by Julianne Rice [phone]) on 03/21/2018 2:58:56 PM   Radiology Dg Chest 2 View  Result Date: 03/21/2018 CLINICAL DATA:  Acute chest pain today. EXAM: CHEST - 2 VIEW COMPARISON:  01/22/2017 and prior radiographs FINDINGS: The cardiomediastinal silhouette is unremarkable. There is no evidence of focal airspace disease, pulmonary edema, suspicious pulmonary nodule/mass, pleural effusion, or pneumothorax. No acute bony abnormalities are identified. IMPRESSION: No active cardiopulmonary disease. Electronically Signed   By: Margarette Canada M.D.   On: 03/21/2018 12:13   Ct Head Wo Contrast  Result Date: 03/21/2018 CLINICAL DATA:  52 year old male with acute headache following head injury. Patient on anticoagulation. EXAM: CT HEAD WITHOUT CONTRAST TECHNIQUE: Contiguous axial images were obtained from the base of the skull through the vertex without intravenous contrast. COMPARISON:  01/23/2017 MR and 04/03/2016 CT FINDINGS: Brain: No evidence of acute infarction, hemorrhage, hydrocephalus, extra-axial collection or mass lesion/mass effect. Vascular: No hyperdense vessel or unexpected calcification. Skull: Normal. Negative for fracture or focal lesion. Sinuses/Orbits: Chronic opacification of anterior ethmoid and frontal sinuses noted with mucosal thickening in the remainder of the paranasal sinuses. No air-fluid levels identified. Other: None IMPRESSION: 1. No evidence of acute intracranial abnormality 2. Chronic paranasal sinusitis. Electronically Signed   By: Margarette Canada M.D.    On: 03/21/2018 12:11    Procedures Procedures (including critical care time)  Medications Ordered in ED Medications - No data to display   Initial Impression / Assessment and Plan / ED Course  I have reviewed the triage vital signs and the nursing notes.  Pertinent labs & imaging results that were available during my care of the patient were reviewed by me and considered in my medical decision making (see chart for details).  CT head without acute findings.  Troponin x2 is normal.  Chest pain is very atypical for CAD.  Advised to follow-up with primary physician.  Head injury precautions have been given.  Final Clinical Impressions(s) / ED Diagnoses   Final diagnoses:  Minor head injury, initial encounter  Atypical chest pain    ED Discharge Orders    None       Julianne Rice, MD 03/21/18 1459

## 2018-03-21 NOTE — ED Triage Notes (Signed)
As this rn is finishing the triage process the patient is moveing around and states " I am not sure what has made my chest hurt so bad" - patient states that he has had chest pain since he hit his head

## 2018-03-21 NOTE — ED Notes (Signed)
ED Provider at bedside. 

## 2018-03-25 DIAGNOSIS — Z719 Counseling, unspecified: Secondary | ICD-10-CM | POA: Diagnosis not present

## 2018-04-08 DIAGNOSIS — Z719 Counseling, unspecified: Secondary | ICD-10-CM | POA: Diagnosis not present

## 2018-04-27 ENCOUNTER — Other Ambulatory Visit: Payer: Self-pay | Admitting: Physician Assistant

## 2018-04-27 ENCOUNTER — Other Ambulatory Visit: Payer: Self-pay | Admitting: Internal Medicine

## 2018-05-11 ENCOUNTER — Other Ambulatory Visit (HOSPITAL_COMMUNITY): Payer: Self-pay | Admitting: *Deleted

## 2018-05-11 ENCOUNTER — Telehealth: Payer: Self-pay | Admitting: Cardiovascular Disease

## 2018-05-11 MED ORDER — DOFETILIDE 500 MCG PO CAPS: 500.0000 ug | ORAL_CAPSULE | Freq: Two times a day (BID) | ORAL | 0 refills | Status: DC

## 2018-05-25 ENCOUNTER — Other Ambulatory Visit: Payer: Self-pay

## 2018-05-25 MED ORDER — OMEPRAZOLE 20 MG PO CPDR: 20.0000 mg | DELAYED_RELEASE_CAPSULE | Freq: Two times a day (BID) | ORAL | 5 refills | Status: DC

## 2018-06-22 ENCOUNTER — Encounter: Payer: Self-pay | Admitting: Cardiovascular Disease

## 2018-06-22 ENCOUNTER — Ambulatory Visit: Payer: 59 | Admitting: Cardiovascular Disease

## 2018-06-22 ENCOUNTER — Encounter (INDEPENDENT_AMBULATORY_CARE_PROVIDER_SITE_OTHER): Payer: Self-pay

## 2018-06-22 VITALS — BP 140/90 | HR 68 | Ht 74.0 in | Wt 322.0 lb

## 2018-06-22 DIAGNOSIS — I480 Paroxysmal atrial fibrillation: Secondary | ICD-10-CM

## 2018-06-22 DIAGNOSIS — I10 Essential (primary) hypertension: Secondary | ICD-10-CM | POA: Diagnosis not present

## 2018-06-22 MED ORDER — DOFETILIDE 500 MCG PO CAPS: 500.0000 ug | ORAL_CAPSULE | Freq: Two times a day (BID) | ORAL | 11 refills | Status: DC

## 2018-06-22 NOTE — Patient Instructions (Signed)
Medication Instructions:  Your physician recommends that you continue on your current medications as directed. Please refer to the Current Medication list given to you today.  If you need a refill on your cardiac medications before your next appointment, please call your pharmacy.    Lab work: TODAY - magnesium, basic metabolic panel  If you have labs (blood work) drawn today and your tests are completely normal, you will receive your results only by: Marland Kitchen MyChart Message (if you have MyChart) OR . A paper copy in the mail If you have any lab test that is abnormal or we need to change your treatment, we will call you to review the results.  Testing/Procedures: None Ordered   Follow-Up: At [facility], you and your health needs are our priority.  As part of our continuing mission to provide you with exceptional heart care, we have created designated Provider Care Teams.  These Care Teams include your primary Cardiologist (physician) and Advanced Practice Providers (APPs -  Physician Assistants and Nurse Practitioners) who all work together to provide you with the care you need, when you need it. You will need a follow up appointment in:  6 months.  Please call our office 2 months in advance to schedule this appointment.  You may see Mertie Moores, MD or one of the following Advanced Practice Providers on your designated Care Team: Richardson Dopp, PA-C [place], [place] . Daune Perch, NP

## 2018-06-22 NOTE — Progress Notes (Signed)
Celedonio Miyamoto Date of Birth  04/21/1966       [facility] Office [address], [address], [place], [place]  [address]   [place], [place]  [address] [address]   Fax  [phone]     Fax [phone]  Problem List: 1. Atrial fibrillation 2. Hypertension 3. Gout 4. Gastroesophageal reflux disease 5. Obstructive sleep apnea 6. Hyperlipidemia  History of Present Illness: Vernon is a 52 year old gentleman who was recently admitted to [facility] with an episode of rapid atrial fibrillation.  He converted on a diltiazem drip.  He did well until this past Saturday when He had another episode of atrial fibrillation.  He does not drink any alcohol.  Does not smoke cigarettes.  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  Echo : Left ventricle: The cavity size was normal. Wall thickness was increased in a pattern of mild LVH. Systolic function was normal. The estimated ejection fraction was in the range of 55% to 60%. Wall motion was normal; there were no regional wall motion abnormalities. Features are consistent with a pseudonormal left ventricular filling pattern, with concomitant abnormal relaxation and increased filling pressure (grade 2 diastolic dysfunction). - Aortic valve: There was no stenosis. - Mitral valve: There was no significant regurgitation. - Left atrium: The atrium was mildly dilated. - Right ventricle: The cavity size was mildly dilated. Systolic function was normal. - Right atrium: The atrium was mildly dilated. - Pulmonary arteries: No complete TR doppler jet so unable to estimate PA systolic pressure. - Systemic veins: IVC measured 2.1 cm with normal respirophasic variation, suggesting RA pressure 8 mmHg  He has done well.  He has joined the gym   Dec. 5, 2016:  Raife is seen today for follow-up on emergency room visit. He was seen in the ER for hypertension and atrial  fibrillation. Converted back to NSR while in the ER. Has had more paroxysmal atrial fib.  - had a bad episode 2 weeks ago .  Had run out of his Eliquis ( pharmacy could not get it for some reason )  Has been to the A-fib clinic  BP is higher - had more salt than usual yesterday .  BP is typically 120s / 80s. Has tried amlodipine and had episodes of hypotension .   June 22, 2018:  Time he is seen back today for follow-up of his paroxysmal atrial fibrillation.  He also has a history of hyperlipidemia and hypertension. Still has some rare episodes of AF    BP is mildly elevated.  Has been having some gout issues - is taking Indocin several at bedtime .  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 apixaban (ELIQUIS) 5 MG TABS tablet TAKE ONE TABLET BY MOUTH TWICE DAILY    . atorvastatin (LIPITOR) 20 MG tablet Take 20 mg by mouth daily.    Marland Kitchen diltiazem (CARDIZEM) 30 MG tablet Take 1 tablet every 4 hours AS NEEDED for elevated heart rate 30 tablet 1  . dofetilide (TIKOSYN) 500 MCG capsule Take 500 mcg by mouth 2 (two) times daily.    . fluticasone (FLONASE) 50 MCG/ACT nasal spray Place 1 spray into both nostrils daily.    Marland Kitchen  Hypromellose ([employer] GENTLE LUBRICANT EYE DROPS OP) Place 1 drop into both eyes daily as needed (dry eyes).    . Omega-3 Fatty Acids (FISH OIL) 500 MG CAPS Take 2 capsules by mouth.    Marland Kitchen omeprazole (PRILOSEC) 20 MG capsule Take 1 capsule (20 mg total) by mouth 2 (two) times daily. 60 capsule 5  . PRESCRIPTION MEDICATION Inhale into the lungs at bedtime. CPAP    . TURMERIC PO Take by mouth every other day.     No current facility-administered medications on file prior to visit.     Allergies  Allergen Reactions  . Hydrochlorothiazide-Propranolol [Propranolol-Hctz] Other (See Comments)    Joints lock up  .  Penicillins Other (See Comments)    Hives Makes joints stiff and unable to move Has patient had a PCN reaction causing immediate rash, facial/tongue/throat swelling, SOB or lightheadedness with hypotension: No Has patient had a PCN reaction causing severe rash involving mucus membranes or skin necrosis: No Has patient had a PCN reaction that required hospitalization No Has patient had a PCN reaction occurring within the last 10 years: No If all of the above answers are "NO", then may proceed with Cephalosporin use.    Past Medical History:  Diagnosis Date  . Arthritis    "knees" (01/22/2017)  . Chronic lower back pain   . GERD (gastroesophageal reflux disease)   . Gout    "no flares in awhile" (01/22/2017)  . History of MRI of cervical spine 09/25/10   normal; left shoulder normal as well  . Hypertension   . Migraine    "~ q 6 months" (01/22/2017)  . Morbid obesity ([place])    "I've lost 43# in 15 weeks w/weight watchers" (01/22/2017)  . OSA on CPAP   . Paroxysmal atrial fibrillation ([place]) 7/15   chads2vasc score is 1    Past Surgical History:  Procedure Laterality Date  . CARDIAC CATHETERIZATION  05/2005   "no blockage"  . KNEE ARTHROSCOPY Right 02/2013  . LEFT HEART CATH AND CORONARY ANGIOGRAPHY N/A 01/27/2017   Procedure: Left Heart Cath and Coronary Angiography;  Surgeon: Troy Sine, MD;  Location: [facility];  Service: Cardiovascular;  Laterality: N/A;    Social History   Tobacco Use  Smoking Status Former Smoker  . Packs/day: 1.00  . Years: 10.00  . Pack years: 10.00  . Types: Cigarettes  . Last attempt to quit: 04/02/1994  . Years since quitting: 24.2  Smokeless Tobacco Former Systems developer  . Types: Chew  Tobacco Comment   "chewed in my teens"    Social History   Substance and Sexual Activity  Alcohol Use No  . Alcohol/week: 0.0 standard drinks   Comment: "stopped in 1994"    Family History  Problem Relation Age of Onset  . Atrial fibrillation Mother  88       had a stroke    Reviw of Systems:  Reviewed in the HPI.  All other systems are negative.  Physical Exam: Blood pressure 140/90, pulse 68, height 6\' 2"  (1.88 m), weight (!) 322 lb (146.1 kg), SpO2 94 %.  GEN:  Well nourished, well developed in no acute distress HEENT: Normal NECK: No JVD; No carotid bruits LYMPHATICS: No lymphadenopathy CARDIAC: RRR , no murmurs, rubs, gallops RESPIRATORY:  Clear to auscultation without rales, wheezing or rhonchi  ABDOMEN: Soft, non-tender, non-distended MUSCULOSKELETAL:  No edema; No deformity  SKIN: Warm and dry NEUROLOGIC:  Alert and oriented x 3   ECG: June 22, 2018:  Normal sinus rhythm at a rate of 68.  Normal EKG. QTc is 425 ms   Assessment / Plan:   1. Atrial fibrillation -    Followed by Dr. Rayann Heman and Roderic Palau in the AF clinic Refill Tikosyn Check BMP and Mag today  Office visit in 6 months with Mag and BMP   2. Hypertension-    Needs to lose weight . Continue meds.   3. Gout - has been on Indocin    4. Gastroesophageal reflux disease 5. Obstructive sleep apnea    Mertie Moores, MD  06/22/2018 3:54 PM    [address] [place],  [place],   [id number] Pager [phone] Phone: [phone]; Fax: [phone]

## 2018-06-23 LAB — MAGNESIUM: MAGNESIUM: 2.1 mg/dL (ref 1.6–2.3)

## 2018-06-23 LAB — BASIC METABOLIC PANEL
BUN / CREAT RATIO: 11 (ref 9–20)
BUN: 10 mg/dL (ref 6–24)
CALCIUM: 9.3 mg/dL (ref 8.7–10.2)
CO2: 22 mmol/L (ref 20–29)
Chloride: 105 mmol/L (ref 96–106)
Creatinine, Ser: 0.92 mg/dL (ref 0.76–1.27)
GFR calc non Af Amer: 94 mL/min/{1.73_m2} (ref >59)
GFR, EST AFRICAN AMERICAN: 109 mL/min/{1.73_m2} (ref >59)
GLUCOSE: 95 mg/dL (ref 65–99)
Potassium: 4 mmol/L (ref 3.5–5.2)
Sodium: 143 mmol/L (ref 134–144)

## 2018-06-30 ENCOUNTER — Ambulatory Visit (HOSPITAL_COMMUNITY)
Admission: EM | Admit: 2018-06-30 | Discharge: 2018-06-30 | Disposition: A | Discharge status: Home / Self Care | Payer: 59 | Attending: Family Medicine | Admitting: Family Medicine

## 2018-06-30 DIAGNOSIS — M546 Pain in thoracic spine: Secondary | ICD-10-CM

## 2018-06-30 DIAGNOSIS — S46812A Strain of other muscles, fascia and tendons at shoulder and upper arm level, left arm, initial encounter: Secondary | ICD-10-CM | POA: Diagnosis not present

## 2018-06-30 MED ORDER — CYCLOBENZAPRINE HCL 10 MG PO TABS: 10.0000 mg | ORAL_TABLET | Freq: Two times a day (BID) | ORAL | 0 refills | Status: DC | PRN

## 2018-06-30 MED ORDER — PREDNISONE 50 MG PO TABS: 50.0000 mg | ORAL_TABLET | Freq: Every day | ORAL | 0 refills | Status: AC

## 2018-06-30 NOTE — Discharge Instructions (Signed)
Pain most likely from muscular strain Begin prednisone daily for 5 days with food on stomach- recommend in the morning You may use flexeril as needed to help with pain. This is a muscle relaxer and causes sedation- please use only at bedtime or when you will be home and not have to drive/work- May take 1/2 tablet if causing too much drowsiness Alternate ice and heat

## 2018-06-30 NOTE — ED Provider Notes (Signed)
[facility]    CSN: [id number] Arrival date & time: 06/30/18  1326     History   Chief Complaint Chief Complaint  Patient presents with  . Appointment    13:30    HPI [name] is a 52 y.o. male history of hypertension, OSA, paroxysmal A. fib on Eliquis, arthritis presenting today for evaluation of left-sided neck/shoulder and back pain.  Patient states that he woke up this morning with pain on his left side.  Pain with turning his head, using his left shoulder in certain movements.  Denies any injury, increase in activity or heavy lifting recently.  He is not taking anything for his symptoms.  Denies any numbness or tingling.  Denies any weakness.  Denies any changes in vision, lightheadedness or dizziness.  Denies chest pain or shortness of breath.  Denies recent coughing or URI symptoms.  Denies dysuria, increased frequency or hematuria.  HPI  Past Medical History:  Diagnosis Date  . Arthritis    "knees" (01/22/2017)  . Chronic lower back pain   . GERD (gastroesophageal reflux disease)   . Gout    "no flares in awhile" (01/22/2017)  . History of MRI of cervical spine 09/25/10   normal; left shoulder normal as well  . Hypertension   . Migraine    "~ q 6 months" (01/22/2017)  . Morbid obesity ([place])    "I've lost 43# in 15 weeks w/weight watchers" (01/22/2017)  . OSA on CPAP   . Paroxysmal atrial fibrillation ([place]) 7/15   chads2vasc score is 1    Patient Active Problem List   Diagnosis Date Noted  . Urinary incontinence 01/02/2018  . Left groin pain 01/02/2018  . Allergic rhinitis 01/02/2018  . Healthcare maintenance 09/12/2017  . Acute nonintractable headache 02/03/2017  . Abnormal stress test   . Symptomatic sinus bradycardia   . Left sided numbness   . Muscle fasciculation   . Primary osteoarthritis of right knee 07/23/2016  . Plantar fasciitis of left foot 05/07/2016  . 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 Acute gout   . Dizziness 03/21/2015  . Shortness of breath 03/21/2015  . OSA on CPAP   . PAF (paroxysmal atrial fibrillation) ([place])   . Palpitations   . Chest pain 03/03/2015  . Left knee pain 10/13/2014  . Atrial fibrillation ([place]) 03/14/2014  . Right knee pain 11/10/2012  . Sinusitis 10/23/2012  . Left hip pain 06/17/2011  . OSA (obstructive sleep apnea) 05/17/2011  . Chronic back pain 10/29/2010  . INSOMNIA 09/19/2008  . Gout 05/13/2007  . Hyperlipidemia 10/02/2006  . Morbid obesity ([place]) 10/02/2006  . Migraine 10/02/2006  . HYPERTENSION, BENIGN SYSTEMIC 10/02/2006  . GASTROESOPHAGEAL REFLUX, NO ESOPHAGITIS 10/02/2006    Past Surgical History:  Procedure Laterality Date  . CARDIAC CATHETERIZATION  05/2005   "no blockage"  . KNEE ARTHROSCOPY Right 02/2013  . LEFT HEART CATH AND CORONARY ANGIOGRAPHY N/A 01/27/2017   Procedure: Left Heart Cath and Coronary Angiography;  Surgeon: Troy Sine, MD;  Location: [facility];  Service: Cardiovascular;  Laterality: N/A;       Home Medications    Prior to Admission medications   Medication Sig Start Date End Date Taking? Authorizing Provider  apixaban (ELIQUIS) 5 MG TABS tablet TAKE ONE TABLET BY MOUTH TWICE DAILY 04/15/14   [provider]  atorvastatin (LIPITOR) 20 MG tablet Take 20 mg by mouth  daily.    [provider]  cyclobenzaprine (FLEXERIL) 10 MG tablet Take 1 tablet (10 mg total) by mouth 2 (two) times daily as needed for muscle spasms. 06/30/18   Wieters, Hallie C, PA-C  diltiazem (CARDIZEM) 30 MG tablet Take 1 tablet every 4 hours AS NEEDED for elevated heart rate 08/21/17   Sherran Needs, NP  dofetilide (TIKOSYN) 500 MCG capsule Take 1 capsule (500 mcg total) by mouth 2 (two) times daily. 06/22/18   Nahser, Wonda Cheng, MD  fluticasone (FLONASE) 50 MCG/ACT nasal spray Place 1 spray into both nostrils daily.    [provider]    Hypromellose ([employer] GENTLE LUBRICANT EYE DROPS OP) Place 1 drop into both eyes daily as needed (dry eyes).    [provider]  Omega-3 Fatty Acids (FISH OIL) 500 MG CAPS Take 2 capsules by mouth.    [provider]  omeprazole (PRILOSEC) 20 MG capsule Take 1 capsule (20 mg total) by mouth 2 (two) times daily. 05/25/18   McDiarmid, Blane Ohara, MD  predniSONE (DELTASONE) 50 MG tablet Take 1 tablet (50 mg total) by mouth daily for 5 days. 06/30/18 07/05/18  Wieters, Hallie C, PA-C  PRESCRIPTION MEDICATION Inhale into the lungs at bedtime. CPAP    [provider]  TURMERIC PO Take by mouth every other day.    [provider]    Family History Family History  Problem Relation Age of Onset  . Atrial fibrillation Mother 74       had a stroke    Social History Social History   Tobacco Use  . Smoking status: Former Smoker    Packs/day: 1.00    Years: 10.00    Pack years: 10.00    Types: Cigarettes    Last attempt to quit: 04/02/1994    Years since quitting: 24.2  . Smokeless tobacco: Former Systems developer    Types: Chew  . Tobacco comment: "chewed in my teens"  Substance Use Topics  . Alcohol use: No    Alcohol/week: 0.0 standard drinks    Comment: "stopped in 1994"  . Drug use: No    Types: Marijuana    Comment: "stopped marijuna in 1994"     Allergies   Hydrochlorothiazide-propranolol [propranolol-hctz] and Penicillins   Review of Systems Review of Systems  Constitutional: Negative for fatigue and fever.  Eyes: Negative for redness, itching and visual disturbance.  Respiratory: Negative for shortness of breath.   Cardiovascular: Negative for chest pain and leg swelling.  Gastrointestinal: Negative for nausea and vomiting.  Genitourinary: Negative for decreased urine volume, difficulty urinating, dysuria and frequency.  Musculoskeletal: Positive for back pain, myalgias and neck pain. Negative for arthralgias.  Skin: Negative for color change, rash and  wound.  Neurological: Negative for dizziness, syncope, weakness, light-headedness and headaches.     Physical Exam Triage Vital Signs ED Triage Vitals  Enc Vitals Group     BP 06/30/18 1345 (!) 141/86     Pulse Rate 06/30/18 1345 86     Resp 06/30/18 1345 16     Temp 06/30/18 1345 98.2 F (36.8 C)     Temp Source 06/30/18 1345 Oral     SpO2 06/30/18 1345 96 %     Weight --      Height --      Head Circumference --      Peak Flow --      Pain Score 06/30/18 1418 0     Pain Loc --  Pain Edu? --      Excl. in [place]? --    No data found.  Updated Vital Signs BP (!) 141/86 (BP Location: Left Arm)   Pulse 86   Temp 98.2 F (36.8 C) (Oral)   Resp 16   SpO2 96%   Visual Acuity Right Eye Distance:   Left Eye Distance:   Bilateral Distance:    Right Eye Near:   Left Eye Near:    Bilateral Near:     Physical Exam  Constitutional: He appears well-developed and well-nourished.  HENT:  Head: Normocephalic and atraumatic.  Eyes: Conjunctivae are normal.  Neck: Neck supple.  Cardiovascular: Normal rate and regular rhythm.  No murmur heard. Pulmonary/Chest: Effort normal and breath sounds normal. No respiratory distress.  Breathing comfortably at rest, CTABL, no wheezing, rales or other adventitious sounds auscultated  Abdominal: Soft. There is no tenderness.  Musculoskeletal: He exhibits no edema.  Tenderness to palpation throughout left trapezius extending into upper thoracic musculature/infra axillary area, no overlying erythema  Full active range of motion of neck, full active range of motion of left shoulder  Neurological: He is alert.  Skin: Skin is warm and dry.  Psychiatric: He has a normal mood and affect.  Nursing note and vitals reviewed.    UC Treatments / Results  Labs (all labs ordered are listed, but only abnormal results are displayed) Labs Reviewed - No data to display  EKG None  Radiology No results found.  Procedures Procedures  (including critical care time)  Medications Ordered in UC Medications - No data to display  Initial Impression / Assessment and Plan / UC Course  I have reviewed the triage vital signs and the nursing notes.  Pertinent labs & imaging results that were available during my care of the patient were reviewed by me and considered in my medical decision making (see chart for details).     Tenderness throughout trapezius and thoracic musculature.  No injury, no bony tenderness, full active range of motion of neck and shoulder.  Lungs clear.  No urinary symptoms.  Will recommend treatment for muscular strain.  Patient on Eliquis, will avoid NSAIDs.  Will provide prednisone as alternative as well as muscle relaxer.  Discussed drowsiness regarding Flexeril.  Gentle stretching.  Would expect gradual improvement over the next 1 to 2 weeks.Discussed strict return precautions. Patient verbalized understanding and is agreeable with plan.  Final Clinical Impressions(s) / UC Diagnoses   Final diagnoses:  Trapezius strain, left, initial encounter  Acute left-sided thoracic back pain     Discharge Instructions     Pain most likely from muscular strain Begin prednisone daily for 5 days with food on stomach- recommend in the morning You may use flexeril as needed to help with pain. This is a muscle relaxer and causes sedation- please use only at bedtime or when you will be home and not have to drive/work- May take 1/2 tablet if causing too much drowsiness Alternate ice and heat   ED Prescriptions    Medication Sig Dispense Auth. Provider   predniSONE (DELTASONE) 50 MG tablet Take 1 tablet (50 mg total) by mouth daily for 5 days. 5 tablet Wieters, Hallie C, PA-C   cyclobenzaprine (FLEXERIL) 10 MG tablet Take 1 tablet (10 mg total) by mouth 2 (two) times daily as needed for muscle spasms. 20 tablet Wieters, [place] C, PA-C     Controlled Substance Prescriptions [place] Controlled Substance Registry  consulted? Not Applicable   Wieters, [place] C, PA-C  06/30/18 2202  

## 2018-06-30 NOTE — ED Triage Notes (Signed)
Seen  by provider only

## 2018-07-30 ENCOUNTER — Encounter (HOSPITAL_COMMUNITY): Payer: Self-pay

## 2018-07-30 ENCOUNTER — Emergency Department (HOSPITAL_COMMUNITY): Payer: 59

## 2018-07-30 ENCOUNTER — Other Ambulatory Visit: Payer: Self-pay

## 2018-07-30 ENCOUNTER — Emergency Department (HOSPITAL_COMMUNITY)
Admission: EM | Admit: 2018-07-30 | Discharge: 2018-07-30 | Disposition: A | Discharge status: Home / Self Care | Payer: 59 | Attending: Emergency Medicine | Admitting: Emergency Medicine

## 2018-07-30 DIAGNOSIS — R519 Headache, unspecified: Secondary | ICD-10-CM

## 2018-07-30 DIAGNOSIS — Z79899 Other long term (current) drug therapy: Secondary | ICD-10-CM | POA: Diagnosis not present

## 2018-07-30 DIAGNOSIS — R51 Headache: Secondary | ICD-10-CM | POA: Diagnosis not present

## 2018-07-30 DIAGNOSIS — R112 Nausea with vomiting, unspecified: Secondary | ICD-10-CM

## 2018-07-30 DIAGNOSIS — B349 Viral infection, unspecified: Secondary | ICD-10-CM | POA: Diagnosis not present

## 2018-07-30 DIAGNOSIS — R739 Hyperglycemia, unspecified: Secondary | ICD-10-CM | POA: Diagnosis not present

## 2018-07-30 DIAGNOSIS — I10 Essential (primary) hypertension: Secondary | ICD-10-CM | POA: Insufficient documentation

## 2018-07-30 DIAGNOSIS — Z87891 Personal history of nicotine dependence: Secondary | ICD-10-CM | POA: Diagnosis not present

## 2018-07-30 DIAGNOSIS — R3129 Other microscopic hematuria: Secondary | ICD-10-CM

## 2018-07-30 DIAGNOSIS — I4891 Unspecified atrial fibrillation: Secondary | ICD-10-CM | POA: Diagnosis not present

## 2018-07-30 LAB — URINALYSIS, ROUTINE W REFLEX MICROSCOPIC
BACTERIA UA: NONE SEEN
Bilirubin Urine: NEGATIVE
Glucose, UA: NEGATIVE mg/dL
Ketones, ur: NEGATIVE mg/dL
Leukocytes, UA: NEGATIVE
NITRITE: NEGATIVE
PH: 7 (ref 5.0–8.0)
Protein, ur: NEGATIVE mg/dL
Specific Gravity, Urine: 1.017 (ref 1.005–1.030)

## 2018-07-30 LAB — CBC
HCT: 46.7 % (ref 39.0–52.0)
Hemoglobin: 15.1 g/dL (ref 13.0–17.0)
MCH: 26.7 pg (ref 26.0–34.0)
MCHC: 32.3 g/dL (ref 30.0–36.0)
MCV: 82.7 fL (ref 80.0–100.0)
Platelets: 295 10*3/uL (ref 150–400)
RBC: 5.65 MIL/uL (ref 4.22–5.81)
RDW: 13 % (ref 11.5–15.5)
WBC: 8 10*3/uL (ref 4.0–10.5)
nRBC: 0 % (ref 0.0–0.2)

## 2018-07-30 LAB — I-STAT TROPONIN, ED: Troponin i, poc: 0 ng/mL (ref 0.00–0.08)

## 2018-07-30 LAB — COMPREHENSIVE METABOLIC PANEL
ALT: 27 U/L (ref 0–44)
AST: 29 U/L (ref 15–41)
Albumin: 4 g/dL (ref 3.5–5.0)
Alkaline Phosphatase: 72 U/L (ref 38–126)
Anion gap: 12 (ref 5–15)
BUN: 10 mg/dL (ref 6–20)
CHLORIDE: 106 mmol/L (ref 98–111)
CO2: 23 mmol/L (ref 22–32)
Calcium: 9.7 mg/dL (ref 8.9–10.3)
Creatinine, Ser: 0.97 mg/dL (ref 0.61–1.24)
GFR calc Af Amer: >60 mL/min (ref >60)
Glucose, Bld: 136 mg/dL — ABNORMAL HIGH (ref 70–99)
Potassium: 3.7 mmol/L (ref 3.5–5.1)
Sodium: 141 mmol/L (ref 135–145)
Total Bilirubin: 0.7 mg/dL (ref 0.3–1.2)
Total Protein: 7.7 g/dL (ref 6.5–8.1)

## 2018-07-30 LAB — LIPASE, BLOOD: LIPASE: 28 U/L (ref 11–51)

## 2018-07-30 MED ORDER — METOCLOPRAMIDE HCL 10 MG PO TABS: 10.0000 mg | ORAL_TABLET | Freq: Four times a day (QID) | ORAL | 0 refills | Status: DC | PRN

## 2018-07-30 MED ORDER — PROCHLORPERAZINE EDISYLATE 10 MG/2ML IJ SOLN
5.0000 mg | Freq: Once | INTRAMUSCULAR | Status: AC
  Administered 2018-07-30: 5 mg via INTRAVENOUS
  Filled 2018-07-30: qty 2

## 2018-07-30 MED ORDER — SODIUM CHLORIDE 0.9 % IV BOLUS
1000.0000 mL | Freq: Once | INTRAVENOUS | Status: AC
  Administered 2018-07-30: 1000 mL via INTRAVENOUS

## 2018-07-30 MED ORDER — DIPHENHYDRAMINE HCL 50 MG/ML IJ SOLN
25.0000 mg | Freq: Once | INTRAMUSCULAR | Status: AC
  Administered 2018-07-30: 25 mg via INTRAVENOUS
  Filled 2018-07-30: qty 1

## 2018-07-30 NOTE — ED Triage Notes (Signed)
Pt arrives to ED with complaints of nausea, intermittent vomiting, and generalized headache for three days. Pt reports being able to keep food down last night. Pt states he did not try home medications because he is on tikosyn. Pt in NAD, placed in position of comfort with call bell in reach, bed locked and lowered.

## 2018-07-30 NOTE — ED Provider Notes (Signed)
[facility] EMERGENCY DEPARTMENT Provider Note   CSN: [id number] Arrival date & time: 07/30/18  0605     History   Chief Complaint Chief Complaint  Patient presents with  . Nausea  . Headache    HPI Manolito Jurewicz is a 52 y.o. male with a past medical history of obesity, gout, hypertension, paroxysmal atrial fibrillation on chronic anticoagulation with Eliquis, who presents to the emergency department with chief complaint of headache. He denies any hx of Headache. The patient states that over the weekend he developed gout in his knees and took  His medicine this past weekend for the gout.  He states that sometimes it makes him feel sick to his stomach.  He was doing well and his gout improved until Tuesday night when he developed a headache followed by nausea vomiting and diarrhea.  He has had multiple episodes of nonbloody nonbilious vomitus and watery stool.  He denies any abdominal pain.  He complains of a global headache which is throbbing without any other associated neurologic abnormalities.  The patient states that he has had persistent A. fib since he started feeling unwell and took his diltiazem last night after his heart rate reached about 140.  At that time he felt short of breath and dizzy but does not have any complaints of dizziness or shortness of breath at this time.  He also denies chest pain.  Patient states that his headache is his most pressing complaint.  His nausea is currently under control.  He has not vomited since yesterday.  He has been and unable to hold down more than 1 Gatorade bottle over the past 3 days.   HPI  Past Medical History:  Diagnosis Date  . Arthritis    "knees" (01/22/2017)  . Chronic lower back pain   . GERD (gastroesophageal reflux disease)   . Gout    "no flares in awhile" (01/22/2017)  . History of MRI of cervical spine 09/25/10   normal; left shoulder normal as well  . Hypertension   . Migraine    "~ q 6 months" (01/22/2017)   . Morbid obesity ([place])    "I've lost 43# in 15 weeks w/weight watchers" (01/22/2017)  . OSA on CPAP   . Paroxysmal atrial fibrillation ([place]) 7/15   chads2vasc score is 1    Patient Active Problem List   Diagnosis Date Noted  . Urinary incontinence 01/02/2018  . Left groin pain 01/02/2018  . Allergic rhinitis 01/02/2018  . Healthcare maintenance 09/12/2017  . Acute nonintractable headache 02/03/2017  . Abnormal stress test   . Symptomatic sinus bradycardia   . Left sided numbness   . Muscle fasciculation   . Primary osteoarthritis of right knee 07/23/2016  . Plantar fasciitis of left foot 05/07/2016  . 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 Acute gout   . Dizziness 03/21/2015  . Shortness of breath 03/21/2015  . OSA on CPAP   . PAF (paroxysmal atrial fibrillation) ([place])   . Palpitations   . Chest pain 03/03/2015  . Left knee pain 10/13/2014  . Atrial fibrillation ([place]) 03/14/2014  . Right knee pain 11/10/2012  . Sinusitis 10/23/2012  . Left hip pain 06/17/2011  . OSA (obstructive sleep apnea) 05/17/2011  . Chronic back pain 10/29/2010  . INSOMNIA 09/19/2008  . Gout 05/13/2007  . Hyperlipidemia 10/02/2006  . Morbid obesity ([place]) 10/02/2006  . Migraine 10/02/2006  . HYPERTENSION, BENIGN  SYSTEMIC 10/02/2006  . GASTROESOPHAGEAL REFLUX, NO ESOPHAGITIS 10/02/2006    Past Surgical History:  Procedure Laterality Date  . CARDIAC CATHETERIZATION  05/2005   "no blockage"  . KNEE ARTHROSCOPY Right 02/2013  . LEFT HEART CATH AND CORONARY ANGIOGRAPHY N/A 01/27/2017   Procedure: Left Heart Cath and Coronary Angiography;  Surgeon: Troy Sine, MD;  Location: [facility];  Service: Cardiovascular;  Laterality: N/A;        Home Medications    Prior to Admission medications   Medication Sig Start Date End Date Taking? Authorizing Provider  apixaban (ELIQUIS) 5 MG TABS tablet  TAKE ONE TABLET BY MOUTH TWICE DAILY 04/15/14   [provider]  atorvastatin (LIPITOR) 20 MG tablet Take 20 mg by mouth daily.    [provider]  cyclobenzaprine (FLEXERIL) 10 MG tablet Take 1 tablet (10 mg total) by mouth 2 (two) times daily as needed for muscle spasms. 06/30/18   Wieters, Hallie C, PA-C  diltiazem (CARDIZEM) 30 MG tablet Take 1 tablet every 4 hours AS NEEDED for elevated heart rate 08/21/17   Sherran Needs, NP  dofetilide (TIKOSYN) 500 MCG capsule Take 1 capsule (500 mcg total) by mouth 2 (two) times daily. 06/22/18   Nahser, Wonda Cheng, MD  fluticasone (FLONASE) 50 MCG/ACT nasal spray Place 1 spray into both nostrils daily.    [provider]  Hypromellose ([employer] GENTLE LUBRICANT EYE DROPS OP) Place 1 drop into both eyes daily as needed (dry eyes).    [provider]  Omega-3 Fatty Acids (FISH OIL) 500 MG CAPS Take 2 capsules by mouth.    [provider]  omeprazole (PRILOSEC) 20 MG capsule Take 1 capsule (20 mg total) by mouth 2 (two) times daily. 05/25/18   McDiarmid, Blane Ohara, MD  PRESCRIPTION MEDICATION Inhale into the lungs at bedtime. CPAP    [provider]  TURMERIC PO Take by mouth every other day.    [provider]    Family History Family History  Problem Relation Age of Onset  . Atrial fibrillation Mother 18       had a stroke    Social History Social History   Tobacco Use  . Smoking status: Former Smoker    Packs/day: 1.00    Years: 10.00    Pack years: 10.00    Types: Cigarettes    Last attempt to quit: 04/02/1994    Years since quitting: 24.3  . Smokeless tobacco: Former Systems developer    Types: Chew  . Tobacco comment: "chewed in my teens"  Substance Use Topics  . Alcohol use: No    Alcohol/week: 0.0 standard drinks    Comment: "stopped in 1994"  . Drug use: No    Types: Marijuana    Comment: "stopped marijuna in 1994"     Allergies   Hydrochlorothiazide-propranolol [propranolol-hctz]  and Penicillins   Review of Systems Review of Systems Ten systems reviewed and are negative for acute change, except as noted in the HPI.    Physical Exam Updated Vital Signs BP (!) 130/107   Pulse (!) 102   Temp (!) 97.4 F (36.3 C) (Oral)   Resp (!) 21   Ht 6\' 2"  (1.88 m)   Wt (!) 145.2 kg   SpO2 98%   BMI 41.09 kg/m   Physical Exam Vitals signs and nursing note reviewed.  Constitutional:      General: He is not in acute distress.    Appearance: He is well-developed. He is  not diaphoretic.  HENT:     Head: Normocephalic and atraumatic.  Eyes:     General: No scleral icterus.    Conjunctiva/sclera: Conjunctivae normal.     Pupils: Pupils are equal, round, and reactive to light.     Comments: No horizontal, vertical or rotational nystagmus  Neck:     Musculoskeletal: Normal range of motion and neck supple.     Comments: Full active and passive ROM without pain No midline or paraspinal tenderness No nuchal rigidity or meningeal signs Cardiovascular:     Rate and Rhythm: Normal rate and regular rhythm.  Pulmonary:     Effort: Pulmonary effort is normal. No respiratory distress.     Breath sounds: Normal breath sounds. No wheezing or rales.  Abdominal:     General: Bowel sounds are normal.     Palpations: Abdomen is soft.     Tenderness: There is no abdominal tenderness. There is no guarding or rebound.  Musculoskeletal: Normal range of motion.  Lymphadenopathy:     Cervical: No cervical adenopathy.  Skin:    General: Skin is warm and dry.     Findings: No rash.  Neurological:     Mental Status: He is alert and oriented to person, place, and time.     Cranial Nerves: No cranial nerve deficit.     Motor: No abnormal muscle tone.     Coordination: Coordination normal.     Deep Tendon Reflexes: Reflexes are normal and symmetric.     Comments: Mental Status:  Alert, oriented, thought content appropriate. Speech fluent without evidence of aphasia. Able to follow 2  step commands without difficulty.  Cranial Nerves:  II:  Peripheral visual fields grossly normal, pupils equal, round, reactive to light III,IV, VI: ptosis not present, extra-ocular motions intact bilaterally  V,VII: smile symmetric, facial light touch sensation equal VIII: hearing grossly normal bilaterally  IX,X: midline uvula rise  XI: bilateral shoulder shrug equal and strong XII: midline tongue extension  Motor:  5/5 in upper and lower extremities bilaterally including strong and equal grip strength and dorsiflexion/plantar flexion Sensory: Pinprick and light touch normal in all extremities.  Deep Tendon Reflexes: 2+ and symmetric  Cerebellar: normal finger-to-nose with bilateral upper extremities Gait: normal gait and balance CV: distal pulses palpable throughout   Psychiatric:        Behavior: Behavior normal.        Thought Content: Thought content normal.        Judgment: Judgment normal.      ED Treatments / Results  Labs (all labs ordered are listed, but only abnormal results are displayed) Labs Reviewed  CBC  LIPASE, BLOOD  COMPREHENSIVE METABOLIC PANEL  URINALYSIS, ROUTINE W REFLEX MICROSCOPIC  I-STAT TROPONIN, ED    EKG EKG Interpretation  Date/Time:  Thursday July 30 2018 06:14:35 EST Ventricular Rate:  98 PR Interval:    QRS Duration: 102 QT Interval:  356 QTC Calculation: 455 R Axis:   11 Text Interpretation:  Atrial fibrillation Borderline repolarization abnormality Borderline ST elevation, lateral leads now atrial fibrillation  Confirmed by Ezequiel Essex [phone]) on 07/30/2018 6:19:57 AM   Radiology No results found.  Procedures Procedures (including critical care time)  Medications Ordered in ED Medications  sodium chloride 0.9 % bolus 1,000 mL (1,000 mLs Intravenous New Bag/Given 07/30/18 0654)  prochlorperazine (COMPAZINE) injection 5 mg (5 mg Intravenous Given 07/30/18 0651)  diphenhydrAMINE (BENADRYL) injection 25 mg (25 mg  Intravenous Given 07/30/18 0651)     Initial Impression /  Assessment and Plan / ED Course  I have reviewed the triage vital signs and the nursing notes.  Pertinent labs & imaging results that were available during my care of the patient were reviewed by me and considered in my medical decision making (see chart for details).    Him with flulike symptoms, bad headache.  No signs of meningitis.  Headache is resolved after treatment for migraine and fluid rehydration.  I have no concern for subarachnoid hemorrhage or other emergent cause of headache such as dural venous thrombosis.  Patient appears appropriate for discharge at this time.    Final Clinical Impressions(s) / ED Diagnoses   Final diagnoses:  Viral illness  Bad headache  Nausea and vomiting, intractability of vomiting not specified, unspecified vomiting type  Elevated blood sugar  Microscopic hematuria    ED Discharge Orders    None       Margarita Mail, PA-C 08/03/18 1552    Gareth Morgan, MD 08/03/18 [phone]

## 2018-07-30 NOTE — Discharge Instructions (Addendum)

## 2018-07-30 NOTE — ED Notes (Signed)
Pt given urinal and encouraged to try and urinate.

## 2018-08-06 ENCOUNTER — Ambulatory Visit: Payer: 59 | Admitting: Family Medicine

## 2018-08-07 ENCOUNTER — Other Ambulatory Visit: Payer: Self-pay | Admitting: *Deleted

## 2018-08-07 MED ORDER — ATORVASTATIN CALCIUM 20 MG PO TABS: 20.0000 mg | ORAL_TABLET | Freq: Every day | ORAL | 5 refills | Status: DC

## 2018-08-25 ENCOUNTER — Other Ambulatory Visit: Payer: Self-pay | Admitting: Family Medicine

## 2018-08-31 ENCOUNTER — Other Ambulatory Visit (HOSPITAL_COMMUNITY): Payer: Self-pay | Admitting: *Deleted

## 2018-08-31 MED ORDER — APIXABAN 5 MG PO TABS: 5.0000 mg | ORAL_TABLET | Freq: Two times a day (BID) | ORAL | 0 refills | Status: DC

## 2018-08-31 NOTE — Telephone Encounter (Signed)
Pharmacy calling to check the status of Eliquis refill. Pharmacist stated they gave the pt an "emergency dose" but can no longer given him anymore. Please advise.

## 2018-09-02 ENCOUNTER — Telehealth: Payer: Self-pay

## 2018-09-02 NOTE — Telephone Encounter (Signed)
Patient states that he was completely out of Eliquis and pharmacy would not fill it so he called cardiologist to refill.  He did make an appointment for a office visit on 09/14/2018 at 1350.  Marland KitchenOzella Almond, CMA

## 2018-09-02 NOTE — Telephone Encounter (Signed)
Will send in refill (though it appears cardiologist already did this) Please ask patient to schedule follow up with me  Thanks [name], MD

## 2018-09-14 ENCOUNTER — Encounter: Payer: Self-pay | Admitting: Family Medicine

## 2018-09-14 ENCOUNTER — Other Ambulatory Visit: Payer: Self-pay

## 2018-09-14 ENCOUNTER — Ambulatory Visit (INDEPENDENT_AMBULATORY_CARE_PROVIDER_SITE_OTHER): Payer: 59 | Admitting: Family Medicine

## 2018-09-14 VITALS — BP 138/88 | HR 68 | Temp 98.2°F | Ht 74.0 in | Wt 327.0 lb

## 2018-09-14 DIAGNOSIS — R350 Frequency of micturition: Secondary | ICD-10-CM

## 2018-09-14 DIAGNOSIS — E785 Hyperlipidemia, unspecified: Secondary | ICD-10-CM

## 2018-09-14 DIAGNOSIS — M47816 Spondylosis without myelopathy or radiculopathy, lumbar region: Secondary | ICD-10-CM

## 2018-09-14 DIAGNOSIS — M109 Gout, unspecified: Secondary | ICD-10-CM | POA: Diagnosis not present

## 2018-09-14 DIAGNOSIS — M25561 Pain in right knee: Secondary | ICD-10-CM

## 2018-09-14 DIAGNOSIS — Z13220 Encounter for screening for lipoid disorders: Secondary | ICD-10-CM

## 2018-09-14 DIAGNOSIS — Z23 Encounter for immunization: Secondary | ICD-10-CM

## 2018-09-14 DIAGNOSIS — I480 Paroxysmal atrial fibrillation: Secondary | ICD-10-CM

## 2018-09-14 DIAGNOSIS — J329 Chronic sinusitis, unspecified: Secondary | ICD-10-CM

## 2018-09-14 DIAGNOSIS — R079 Chest pain, unspecified: Secondary | ICD-10-CM

## 2018-09-14 MED ORDER — FLUTICASONE PROPIONATE 50 MCG/ACT NA SUSP: 2.0000 | Freq: Every day | NASAL | 6 refills | Status: AC

## 2018-09-14 MED ORDER — METHOCARBAMOL 750 MG PO TABS: 750.0000 mg | ORAL_TABLET | Freq: Three times a day (TID) | ORAL | 0 refills | Status: DC | PRN

## 2018-09-14 MED ORDER — CETIRIZINE HCL 10 MG PO TABS: 10.0000 mg | ORAL_TABLET | Freq: Every day | ORAL | 11 refills | Status: DC

## 2018-09-14 MED ORDER — COLCHICINE 0.6 MG PO CAPS: 1.0000 | ORAL_CAPSULE | ORAL | 0 refills | Status: DC

## 2018-09-14 NOTE — Patient Instructions (Signed)
Take flonase and zyrtec  On your way out, schedule an appointment one morning to come back for fasting labs. Do not eat or drink anything other than water the morning of your lab appointment until after your labs are drawn.  Refilled colchicine (mitigare)  Refilled robaxin for your muscular pain in L shoulder  Follow up with me in 6 weeks, sooner if needed Flu shot today  Be well, Dr. Ardelia Mems

## 2018-09-14 NOTE — Progress Notes (Signed)
Date of Visit: 09/14/2018   HPI:  Patient presents to discuss several issues.  R knee pain - last visit injected knee. Patient reports it did not help at all. Has been recommended to have a joint replacement but he is trying to hold off for as long as he can.  L chest wall pain - notices it when lifting something heavy, feels like muscle pain in L chest wall and left shoulder. Went to Urgent Care and was given rx for prednisone and flexeril which helped. Bothers him still sometimes, especially if it's going to rain. Doesn't like how flexeril makes him feel. Has methocarbamol at home which helps, and likes how this feels better. No chest pain with ambultion, no shortness of breath.  Would like refill on robaxin.  Back pain - Located in L buttock for months. no bowel or bladder issues other than frequent urination. Doing the elliptical at the gym helps. No lower extremity weakness or saddle anesthesia. No fevers.  Frequent urination: Urinating frequently. Not incontinent. Drinks lots of water. Notices it more because he is on machines for his job and it's a pain to have to get down and go to the bathroom. Otherwise doesn't really bother him. No dysuria or hematuria.  Sinus issues - saw ENT Oct 2018 and was recommended to take several medications which he has not taken lately. Lots of dust at his job. Pressure in frontal sinus. Blowing mucus out of nose. Occasionally runny nose. Uses steamer in the shower which helps some. No fevers. Eating and drinking well.   Gout - needs refill of colchicine. Well controlled with this. Takes it every other day.   ROS: See HPI.  [place]: history of morbid obesity, hypertension, afib, OSA on CPAP, gout, hyperlipidemia, GERD  PHYSICAL EXAM: BP 138/88   Pulse 68   Temp 98.2 F (36.8 C) (Oral)   Ht 6\' 2"  (1.88 m)   Wt (!) 327 lb (148.3 kg)   SpO2 94%   BMI 41.98 kg/m  Gen: no acute distress, pleasant, cooperative HEENT: normocephalic, atraumatic, moist  mucous membranes, nares patent. Tympanic membranes clear bilaterally. No anterior cervical or supraclavicular lymphadenopathy. No sinus tenderness. Nasal voice quality. Heart: regular rate and rhythm, no murmur Lungs: clear to auscultation bilaterally, normal work of breathing  Neuro: alert, grossly nonfocal, speech normal Ext: No appreciable lower extremity edema bilaterally  Back: nontender to palpation  Chest wall: + tender to palpation, reproduces pain described by patient   ASSESSMENT/PLAN:  Health maintenance:  -flu shot given today  Hyperlipidemia Return for fasting lipids as he is not fasting today  Gout Refilled colchicine. Check uric acid at upcoming lab visit.  Atrial fibrillation ([place]) No bleeding on eliquis.  Right knee pain Patient trying to hold off on joint injection as long as possible. No active management for this at this time.  Sinusitis Largely allergic sounding. Restart flonase and zyrtec. Follow up in 6 weeks. May need to see ENT again.  Chest pain Description of pain today is muscular sounding (positional, with moving arms, reproducible on exam). Will refill robaxin.  Degenerative joint disease (DJD) of lumbar spine Encouraged continued exercise since this helps his pain.  Frequent urination Not a complaint of patient's, noted on ROS for back pain. Sounds like he drinks lots of water and therefore has to urinate frequently. Will check A1c at future lab visit. Not bothersome enough to patient to warrant UA at this visit, consider at follow up.  FOLLOW UP: Follow up in 6  weeks for sinuses. Consider UA at that visit. Schedule fasting lab visit.  [place]. Ardelia Mems, [place]

## 2018-09-16 NOTE — Assessment & Plan Note (Signed)
No bleeding on eliquis.

## 2018-09-16 NOTE — Assessment & Plan Note (Signed)
Return for fasting lipids as he is not fasting today

## 2018-09-16 NOTE — Assessment & Plan Note (Signed)
Patient trying to hold off on joint injection as long as possible. No active management for this at this time.

## 2018-09-16 NOTE — Assessment & Plan Note (Signed)
Description of pain today is muscular sounding (positional, with moving arms, reproducible on exam). Will refill robaxin.

## 2018-09-16 NOTE — Assessment & Plan Note (Signed)
Largely allergic sounding. Restart flonase and zyrtec. Follow up in 6 weeks. May need to see ENT again.

## 2018-09-16 NOTE — Assessment & Plan Note (Signed)
Encouraged continued exercise since this helps his pain.

## 2018-09-16 NOTE — Assessment & Plan Note (Signed)
Refilled colchicine. Check uric acid at upcoming lab visit.

## 2018-10-12 ENCOUNTER — Other Ambulatory Visit: Payer: Self-pay | Admitting: Internal Medicine

## 2018-10-13 MED ORDER — MITIGARE 0.6 MG PO CAPS: ORAL_CAPSULE | ORAL | 0 refills | Status: DC

## 2018-10-13 NOTE — Addendum Note (Signed)
Addended by: Esau Grew on: 10/13/2018 11:44 AM   Modules accepted: Orders

## 2018-10-22 ENCOUNTER — Telehealth: Payer: Self-pay | Admitting: Family Medicine

## 2018-10-22 NOTE — Telephone Encounter (Signed)
Attempted to reach patient about possibility of postponing appointment due to [place] pandemic, or to see if there are issues that can be addressed over the phone rather than in-person. No answer. LVM asking him to call back.  [name], MD

## 2018-10-23 NOTE — Telephone Encounter (Signed)
Attempted again to reach patient, no answer, did not leave another VM. [name], MD

## 2018-10-26 ENCOUNTER — Ambulatory Visit: Payer: 59 | Admitting: Family Medicine

## 2018-10-26 NOTE — Telephone Encounter (Addendum)
Was able to reach patient. Offered phone visit, postponing appointment, or to still see him in person today. He prefers to postpone visit. He will call back in about a month to reschedule. He will call sooner with any concerns.  Encouraged patient to stay home as much as possible to avoid exposure to COVID. Educated on emphasis of telehealth right now to minimize risk of disease transmission. Advised he call us with any concerns or needs.   [name], MD

## 2018-11-27 ENCOUNTER — Other Ambulatory Visit: Payer: Self-pay | Admitting: Family Medicine

## 2018-12-22 ENCOUNTER — Telehealth (HOSPITAL_COMMUNITY): Payer: Self-pay | Admitting: *Deleted

## 2018-12-22 NOTE — Telephone Encounter (Signed)
Patient having more and more afib episodes some lasting up to 8 hours. Will try taking cardizem 30mg  at the onset of afib episode and see if this helps with duration. He is currently having issues with his BP being managed by his PCP - figuring out what medication will control it. Pt will try the PRN cardizem with episodes and will report at appointment next week.

## 2018-12-29 ENCOUNTER — Other Ambulatory Visit: Payer: Self-pay

## 2018-12-29 ENCOUNTER — Ambulatory Visit (HOSPITAL_COMMUNITY): Payer: 59 | Admitting: Nurse Practitioner

## 2018-12-29 ENCOUNTER — Ambulatory Visit (HOSPITAL_COMMUNITY)
Admission: RE | Admit: 2018-12-29 | Discharge: 2018-12-29 | Disposition: A | Discharge status: Home / Self Care | Payer: 59 | Source: Ambulatory Visit | Attending: Nurse Practitioner | Admitting: Nurse Practitioner

## 2018-12-29 ENCOUNTER — Encounter (HOSPITAL_COMMUNITY): Payer: Self-pay | Admitting: Nurse Practitioner

## 2018-12-29 VITALS — BP 140/82 | HR 59 | Ht 74.0 in | Wt 336.8 lb

## 2018-12-29 DIAGNOSIS — M1990 Unspecified osteoarthritis, unspecified site: Secondary | ICD-10-CM | POA: Insufficient documentation

## 2018-12-29 DIAGNOSIS — I480 Paroxysmal atrial fibrillation: Secondary | ICD-10-CM | POA: Insufficient documentation

## 2018-12-29 DIAGNOSIS — Z888 Allergy status to other drugs, medicaments and biological substances status: Secondary | ICD-10-CM | POA: Insufficient documentation

## 2018-12-29 DIAGNOSIS — Z7901 Long term (current) use of anticoagulants: Secondary | ICD-10-CM | POA: Insufficient documentation

## 2018-12-29 DIAGNOSIS — Z6841 Body Mass Index (BMI) 40.0 and over, adult: Secondary | ICD-10-CM | POA: Insufficient documentation

## 2018-12-29 DIAGNOSIS — G4733 Obstructive sleep apnea (adult) (pediatric): Secondary | ICD-10-CM | POA: Insufficient documentation

## 2018-12-29 DIAGNOSIS — Z8249 Family history of ischemic heart disease and other diseases of the circulatory system: Secondary | ICD-10-CM | POA: Diagnosis not present

## 2018-12-29 DIAGNOSIS — Z87891 Personal history of nicotine dependence: Secondary | ICD-10-CM | POA: Diagnosis not present

## 2018-12-29 DIAGNOSIS — I10 Essential (primary) hypertension: Secondary | ICD-10-CM | POA: Insufficient documentation

## 2018-12-29 DIAGNOSIS — Z823 Family history of stroke: Secondary | ICD-10-CM | POA: Diagnosis not present

## 2018-12-29 DIAGNOSIS — Z79899 Other long term (current) drug therapy: Secondary | ICD-10-CM | POA: Diagnosis not present

## 2018-12-29 DIAGNOSIS — K219 Gastro-esophageal reflux disease without esophagitis: Secondary | ICD-10-CM | POA: Diagnosis not present

## 2018-12-29 DIAGNOSIS — Z880 Allergy status to penicillin: Secondary | ICD-10-CM | POA: Insufficient documentation

## 2018-12-29 LAB — BASIC METABOLIC PANEL
Anion gap: 10 (ref 5–15)
BUN: 12 mg/dL (ref 6–20)
CO2: 22 mmol/L (ref 22–32)
Calcium: 9.4 mg/dL (ref 8.9–10.3)
Chloride: 108 mmol/L (ref 98–111)
Creatinine, Ser: 0.9 mg/dL (ref 0.61–1.24)
GFR calc Af Amer: >60 mL/min (ref >60)
GFR calc non Af Amer: >60 mL/min (ref >60)
Glucose, Bld: 105 mg/dL — ABNORMAL HIGH (ref 70–99)
Potassium: 5.3 mmol/L — ABNORMAL HIGH (ref 3.5–5.1)
Sodium: 140 mmol/L (ref 135–145)

## 2018-12-29 LAB — MAGNESIUM: Magnesium: 2.2 mg/dL (ref 1.7–2.4)

## 2018-12-29 NOTE — Progress Notes (Signed)
Patient ID: [name], male   DOB: [birth year], 53 y.o.   MRN: [id number]     Primary Care Physician: [name], MD Referring Physician: Dr. Rayann Heman, [facility] f/u Cardiologist: Dr. Leonides Cave is a 53 y.o. male with a h/o PAF on  tikosyn. He feels that he is having more afib burden . He states that it is not when his is off sat/sun/mon but usually returns Tuesday morning when he is getting ready for work and may last 8-10 hours. Other times, he feels that that he is lightheaded and feels that his heart rate may be too slow. Over the 3 days, he is off from work, he states that he takes his tikosyn around 2 hours later.he has gained weight with covid restrictions but is trying to cut back and is down 3 lbs. He does realize that he  has been eating too much salt as well and is cutting back there as well. He is using cpap.   Today, he denies symptoms of palpitations, chest pain, shortness of breath, orthopnea, PND, lower extremity edema, dizziness, presyncope, syncope, or neurologic sequela. Positive for back pain.The patient is tolerating medications without difficulties and is otherwise without complaint today.   Past Medical History:  Diagnosis Date  . Arthritis    "knees" (01/22/2017)  . Chronic lower back pain   . GERD (gastroesophageal reflux disease)   . Gout    "no flares in awhile" (01/22/2017)  . History of MRI of cervical spine 09/25/10   normal; left shoulder normal as well  . Hypertension   . Migraine    "~ q 6 months" (01/22/2017)  . Morbid obesity ([place])    "I've lost 43# in 15 weeks w/weight watchers" (01/22/2017)  . OSA on CPAP   . Paroxysmal atrial fibrillation ([place]) 7/15   chads2vasc score is 1   Past Surgical History:  Procedure Laterality Date  . CARDIAC CATHETERIZATION  05/2005   "no blockage"  . KNEE ARTHROSCOPY Right 02/2013  . LEFT HEART CATH AND CORONARY ANGIOGRAPHY N/A 01/27/2017   Procedure: Left Heart Cath and Coronary Angiography;  Surgeon: Troy Sine, MD;  Location: [place] [facility];  Service: Cardiovascular;  Laterality: N/A;    Current Outpatient Medications  Medication Sig Dispense Refill  . atorvastatin (LIPITOR) 20 MG tablet Take 1 tablet (20 mg total) by mouth daily. 30 tablet 5  . diltiazem (CARDIZEM) 30 MG tablet Take 1 tablet every 4 hours AS NEEDED for elevated heart rate (Patient taking differently: Take 30 mg by mouth every 4 (four) hours as needed. AS NEEDED for elevated heart rate) 30 tablet 1  . dofetilide (TIKOSYN) 500 MCG capsule Take 1 capsule (500 mcg total) by mouth 2 (two) times daily. 60 capsule 11  . ELIQUIS 5 MG TABS tablet TAKE 1 TABLET BY MOUTH TWICE DAILY 180 tablet 0  . methocarbamol (ROBAXIN-750) 750 MG tablet Take 1 tablet (750 mg total) by mouth every 8 (eight) hours as needed for muscle spasms. 30 tablet 0  . MITIGARE 0.6 MG CAPS TAKE 1 CAPSULE BY MOUTH EVERY OTHER DAY 45 capsule 0  . Multiple Vitamin (MULTIVITAMIN WITH MINERALS) TABS tablet Take 1 tablet by mouth at bedtime.    . Omega-3 Fatty Acids (FISH OIL) 500 MG CAPS Take 500 mg by mouth every morning.     Marland Kitchen omeprazole (PRILOSEC) 20 MG capsule Take 1 capsule by mouth twice daily 180 capsule 0  . TURMERIC PO Take by mouth every  other day.    . cetirizine (ZYRTEC) 10 MG tablet Take 1 tablet (10 mg total) by mouth daily. (Patient not taking: Reported on 12/29/2018) 30 tablet 11  . fluticasone (FLONASE) 50 MCG/ACT nasal spray Place 2 sprays into both nostrils daily. (Patient not taking: Reported on 12/29/2018) 16 g 6  . Hypromellose ([employer] GENTLE LUBRICANT EYE DROPS OP) Place 1 drop into both eyes daily as needed (dry eyes).    . metoCLOPramide (REGLAN) 10 MG tablet Take 1 tablet (10 mg total) by mouth every 6 (six) hours as needed for nausea (nausea/headache). (Patient not taking: Reported on 12/29/2018) 6 tablet 0  . PRESCRIPTION MEDICATION Inhale into the lungs at bedtime. CPAP     No current facility-administered medications for this encounter.      Allergies  Allergen Reactions  . Hydrochlorothiazide-Propranolol [Propranolol-Hctz] Other (See Comments)    Joints lock up  . Penicillins Other (See Comments)    Hives Makes joints stiff and unable to move Has patient had a PCN reaction causing immediate rash, facial/tongue/throat swelling, SOB or lightheadedness with hypotension: No Has patient had a PCN reaction causing severe rash involving mucus membranes or skin necrosis: No Has patient had a PCN reaction that required hospitalization No Has patient had a PCN reaction occurring within the last 10 years: No If all of the above answers are "NO", then may proceed with Cephalosporin use.    Social History   Socioeconomic History  . Marital status: Married    Spouse name: Not on file  . Number of children: Not on file  . Years of education: Not on file  . Highest education level: Not on file  Occupational History  . Not on file  Social Needs  . Financial resource strain: Not on file  . Food insecurity:    Worry: Not on file    Inability: Not on file  . Transportation needs:    Medical: Not on file    Non-medical: Not on file  Tobacco Use  . Smoking status: Former Smoker    Packs/day: 1.00    Years: 10.00    Pack years: 10.00    Types: Cigarettes    Last attempt to quit: 04/02/1994    Years since quitting: 24.7  . Smokeless tobacco: Former Systems developer    Types: Chew  . Tobacco comment: "chewed in my teens"  Substance and Sexual Activity  . Alcohol use: No    Alcohol/week: 0.0 standard drinks    Comment: "stopped in 1994"  . Drug use: No    Types: Marijuana    Comment: "stopped marijuna in 1994"  . Sexual activity: Yes  Lifestyle  . Physical activity:    Days per week: Not on file    Minutes per session: Not on file  . Stress: Not on file  Relationships  . Social connections:    Talks on phone: Not on file    Gets together: Not on file    Attends religious service: Not on file    Active member of club or  organization: Not on file    Attends meetings of clubs or organizations: Not on file    Relationship status: Not on file  . Intimate partner violence:    Fear of current or ex partner: Not on file    Emotionally abused: Not on file    Physically abused: Not on file    Forced sexual activity: Not on file  Other Topics Concern  . Not on file  Social History  Narrative   Lives in [place] with spouse.  3 children, all grown.   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 Atrial fibrillation Mother 60       had a stroke    ROS- All systems are reviewed and negative except as per the HPI above  Physical Exam: Vitals:   12/29/18 1034  BP: 140/82  Pulse: (!) 59  Weight: (!) 152.8 kg  Height: 6\' 2"  (1.88 m)    GEN- The patient is well appearing, alert and oriented x 3 today.   Head- normocephalic, atraumatic Eyes-  Sclera clear, conjunctiva pink Ears- hearing intact Oropharynx- clear Neck- supple, no JVP Lymph- no cervical lymphadenopathy Lungs- Clear to ausculation bilaterally, normal work of breathing Heart- Regular rate and rhythm, no murmurs, rubs or gallops, PMI not laterally displaced GI- soft, NT, ND, + BS Extremities- no clubbing, cyanosis, or edema MS- no significant deformity or atrophy Skin- no rash or lesion Psych- euthymic mood, full affect Neuro- strength and sensation are intact  EKG-Sinus brady at 59 bpm, Pr int 160 ms, QRS int 94 ms, Qtc 409 ms Epic records reviewed  Assessment and Plan: 1. afib Increased burden Continue  dofetilide 500 mg bid, qtc stable Pt encouraged to try to take it at the same time whether working or not Will place a one week Zio patch Continue eliquis, chadsvasc score of 1 Continue  cpap Bmet/mag checked today  2. Obesity Continue weight loss efforts  3. HTN Stable  Avoid salt  Weight loss   F/u in clinic in 3 months for Tikosyn surveillance Will  be notified when monitor results are in  [place]. , [facility] [address] [place], [place] [address] [phone]

## 2018-12-31 ENCOUNTER — Ambulatory Visit (HOSPITAL_COMMUNITY)
Admission: RE | Admit: 2018-12-31 | Discharge: 2018-12-31 | Disposition: A | Discharge status: Home / Self Care | Payer: 59 | Source: Ambulatory Visit | Attending: Nurse Practitioner | Admitting: Nurse Practitioner

## 2018-12-31 ENCOUNTER — Other Ambulatory Visit: Payer: Self-pay

## 2018-12-31 ENCOUNTER — Other Ambulatory Visit (HOSPITAL_COMMUNITY): Payer: Self-pay | Admitting: *Deleted

## 2018-12-31 DIAGNOSIS — I4819 Other persistent atrial fibrillation: Secondary | ICD-10-CM

## 2019-01-14 ENCOUNTER — Other Ambulatory Visit: Payer: Self-pay | Admitting: Internal Medicine

## 2019-01-25 ENCOUNTER — Other Ambulatory Visit: Payer: Self-pay | Admitting: Cardiovascular Disease

## 2019-01-31 ENCOUNTER — Emergency Department (HOSPITAL_COMMUNITY)
Admission: EM | Admit: 2019-01-31 | Discharge: 2019-01-31 | Disposition: A | Discharge status: Home / Self Care | Payer: 59 | Attending: Emergency Medicine | Admitting: Emergency Medicine

## 2019-01-31 ENCOUNTER — Other Ambulatory Visit: Payer: Self-pay

## 2019-01-31 ENCOUNTER — Emergency Department (HOSPITAL_COMMUNITY): Payer: 59

## 2019-01-31 DIAGNOSIS — R5383 Other fatigue: Secondary | ICD-10-CM | POA: Insufficient documentation

## 2019-01-31 DIAGNOSIS — I10 Essential (primary) hypertension: Secondary | ICD-10-CM | POA: Diagnosis not present

## 2019-01-31 DIAGNOSIS — R079 Chest pain, unspecified: Secondary | ICD-10-CM | POA: Diagnosis not present

## 2019-01-31 DIAGNOSIS — I4891 Unspecified atrial fibrillation: Secondary | ICD-10-CM | POA: Insufficient documentation

## 2019-01-31 DIAGNOSIS — R0602 Shortness of breath: Secondary | ICD-10-CM | POA: Diagnosis not present

## 2019-01-31 DIAGNOSIS — Z7901 Long term (current) use of anticoagulants: Secondary | ICD-10-CM | POA: Diagnosis not present

## 2019-01-31 DIAGNOSIS — H538 Other visual disturbances: Secondary | ICD-10-CM | POA: Diagnosis not present

## 2019-01-31 DIAGNOSIS — R002 Palpitations: Secondary | ICD-10-CM | POA: Diagnosis present

## 2019-01-31 DIAGNOSIS — R55 Syncope and collapse: Secondary | ICD-10-CM | POA: Diagnosis not present

## 2019-01-31 DIAGNOSIS — Z79899 Other long term (current) drug therapy: Secondary | ICD-10-CM | POA: Diagnosis not present

## 2019-01-31 DIAGNOSIS — Z87891 Personal history of nicotine dependence: Secondary | ICD-10-CM | POA: Diagnosis not present

## 2019-01-31 DIAGNOSIS — R0789 Other chest pain: Secondary | ICD-10-CM

## 2019-01-31 LAB — CBC
HCT: 44.8 % (ref 39.0–52.0)
Hemoglobin: 14.5 g/dL (ref 13.0–17.0)
MCH: 26.9 pg (ref 26.0–34.0)
MCHC: 32.4 g/dL (ref 30.0–36.0)
MCV: 83.1 fL (ref 80.0–100.0)
Platelets: 238 10*3/uL (ref 150–400)
RBC: 5.39 MIL/uL (ref 4.22–5.81)
RDW: 13 % (ref 11.5–15.5)
WBC: 7.7 10*3/uL (ref 4.0–10.5)
nRBC: 0 % (ref 0.0–0.2)

## 2019-01-31 LAB — TROPONIN I (HIGH SENSITIVITY)
Troponin I (High Sensitivity): 7 ng/L (ref <18)
Troponin I (High Sensitivity): 7 ng/L (ref <18)

## 2019-01-31 LAB — BASIC METABOLIC PANEL
Anion gap: 10 (ref 5–15)
BUN: 12 mg/dL (ref 6–20)
CO2: 26 mmol/L (ref 22–32)
Calcium: 9.5 mg/dL (ref 8.9–10.3)
Chloride: 106 mmol/L (ref 98–111)
Creatinine, Ser: 0.99 mg/dL (ref 0.61–1.24)
GFR calc Af Amer: >60 mL/min (ref >60)
GFR calc non Af Amer: >60 mL/min (ref >60)
Glucose, Bld: 133 mg/dL — ABNORMAL HIGH (ref 70–99)
Potassium: 4.1 mmol/L (ref 3.5–5.1)
Sodium: 142 mmol/L (ref 135–145)

## 2019-01-31 LAB — PROTIME-INR
INR: 0.9 (ref 0.8–1.2)
Prothrombin Time: 12.2 seconds (ref 11.4–15.2)

## 2019-01-31 MED ORDER — SODIUM CHLORIDE 0.9 % IV BOLUS
1000.0000 mL | Freq: Once | INTRAVENOUS | Status: AC
  Administered 2019-01-31: 1000 mL via INTRAVENOUS

## 2019-01-31 MED ORDER — DILTIAZEM HCL 25 MG/5ML IV SOLN
5.0000 mg | Freq: Once | INTRAVENOUS | Status: AC
  Administered 2019-01-31: 5 mg via INTRAVENOUS
  Filled 2019-01-31: qty 5

## 2019-01-31 MED ORDER — DILTIAZEM HCL 30 MG PO TABS: 30.0000 mg | ORAL_TABLET | Freq: Once | ORAL | Status: DC

## 2019-01-31 MED ORDER — SODIUM CHLORIDE 0.9% FLUSH
3.0000 mL | Freq: Once | INTRAVENOUS | Status: AC
  Administered 2019-01-31: 3 mL via INTRAVENOUS

## 2019-01-31 NOTE — ED Notes (Signed)
Patient verbalizes understanding of discharge instructions. Opportunity for questioning and answers were provided. Armband removed by staff, pt discharged from ED.  

## 2019-01-31 NOTE — Discharge Instructions (Signed)
Please follow up with A.fib clinic - call them tomorrow for appointment Please return if you are worsening (passing out, severe chest pain)

## 2019-01-31 NOTE — ED Triage Notes (Signed)
Pt states yesterday he was experiencing left sided chest pain which radiates to neck that has only increased. Hx of A.fib. Chest pain is associated with dizziness, lightheadness, and blurry vision.

## 2019-01-31 NOTE — ED Provider Notes (Signed)
[facility] EMERGENCY DEPARTMENT Provider Note   CSN: [id number] Arrival date & time: 01/31/19  3875     History   Chief Complaint Chief Complaint  Patient presents with  . Chest Pain    HPI [name] is a 53 y.o. male who presents with palpitations and chest pain.  Past medical history significant for morbid obesity, paroxysmal atrial fibrillation on Eliquis, hypertension, GERD, gout, chronic neck and back pain.  The patient states that he has A. fib and is been bothering him more recently.  He can tell when he goes into A. fib but usually he notes his heart rate will be in the 130s.  His heart rate has not been high recently but he feels that he is in A. fib more often.  He also endorses intermittent chest pain and will be sharp over the left side of his chest.  It is nonradiating.  He went to the A. fib clinic and told him about this and he they had him on a monitor for a week but states that he felt well during this period.  As soon as he took the monitor off he started to have more episodes again.  Yesterday and today were particularly bad therefore he decided to come to the emergency department.  He reports associated blurry vision, fatigue and near syncope which is worse when he eats something while at work.  He thinks that the hot weather may be affecting him.  He works on a Forensic scientist.  He reports associated shortness of breath.  He denies fever, chills, URI symptoms, cough, abdominal pain, nausea or vomiting, leg swelling.  He has been compliant with his medications and Eliquis     HPI  Past Medical History:  Diagnosis Date  . Arthritis    "knees" (01/22/2017)  . Chronic lower back pain   . GERD (gastroesophageal reflux disease)   . Gout    "no flares in awhile" (01/22/2017)  . History of MRI of cervical spine 09/25/10   normal; left shoulder normal as well  . Hypertension   . Migraine    "~ q 6 months" (01/22/2017)  . Morbid obesity ([place])    "I've lost 43#  in 15 weeks w/weight watchers" (01/22/2017)  . OSA on CPAP   . Paroxysmal atrial fibrillation ([place]) 7/15   chads2vasc score is 1    Patient Active Problem List   Diagnosis Date Noted  . Urinary incontinence 01/02/2018  . Left groin pain 01/02/2018  . Allergic rhinitis 01/02/2018  . Healthcare maintenance 09/12/2017  . Acute nonintractable headache 02/03/2017  . Abnormal stress test   . Symptomatic sinus bradycardia   . Left sided numbness   . Muscle fasciculation   . Primary osteoarthritis of right knee 07/23/2016  . Plantar fasciitis of left foot 05/07/2016  . 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 Acute gout   . Dizziness 03/21/2015  . Shortness of breath 03/21/2015  . OSA on CPAP   . PAF (paroxysmal atrial fibrillation) ([place])   . Palpitations   . Chest pain 03/03/2015  . Left knee pain 10/13/2014  . Atrial fibrillation ([place]) 03/14/2014  . Right knee pain 11/10/2012  . Sinusitis 10/23/2012  . Left hip pain 06/17/2011  . OSA (obstructive sleep apnea) 05/17/2011  . Chronic back pain 10/29/2010  . INSOMNIA 09/19/2008  . Gout 05/13/2007  . Hyperlipidemia 10/02/2006  . Morbid obesity ([place]) 10/02/2006  .  Migraine 10/02/2006  . HYPERTENSION, BENIGN SYSTEMIC 10/02/2006  . GASTROESOPHAGEAL REFLUX, NO ESOPHAGITIS 10/02/2006    Past Surgical History:  Procedure Laterality Date  . CARDIAC CATHETERIZATION  05/2005   "no blockage"  . KNEE ARTHROSCOPY Right 02/2013  . LEFT HEART CATH AND CORONARY ANGIOGRAPHY N/A 01/27/2017   Procedure: Left Heart Cath and Coronary Angiography;  Surgeon: Troy Sine, MD;  Location: [facility];  Service: Cardiovascular;  Laterality: N/A;        Home Medications    Prior to Admission medications   Medication Sig Start Date End Date Taking? Authorizing Provider  atorvastatin (LIPITOR) 20 MG tablet Take 1 tablet by mouth once daily 01/25/19    Nahser, Wonda Cheng, MD  cetirizine (ZYRTEC) 10 MG tablet Take 1 tablet (10 mg total) by mouth daily. Patient not taking: Reported on 12/29/2018 09/14/18   [name], MD  diltiazem (CARDIZEM) 30 MG tablet Take 1 tablet every 4 hours AS NEEDED for elevated heart rate Patient taking differently: Take 30 mg by mouth every 4 (four) hours as needed. AS NEEDED for elevated heart rate 08/21/17   Sherran Needs, NP  dofetilide (TIKOSYN) 500 MCG capsule Take 1 capsule (500 mcg total) by mouth 2 (two) times daily. 06/22/18   Nahser, Wonda Cheng, MD  ELIQUIS 5 MG TABS tablet TAKE 1 TABLET BY MOUTH TWICE DAILY 09/02/18   [name], MD  fluticasone [facility]) 50 MCG/ACT nasal spray Place 2 sprays into both nostrils daily. Patient not taking: Reported on 12/29/2018 09/14/18   [name], MD  Hypromellose ([employer] GENTLE LUBRICANT EYE DROPS OP) Place 1 drop into both eyes daily as needed (dry eyes).    [provider]  methocarbamol (ROBAXIN-750) 750 MG tablet Take 1 tablet (750 mg total) by mouth every 8 (eight) hours as needed for muscle spasms. 09/14/18   [name], MD  metoCLOPramide (REGLAN) 10 MG tablet Take 1 tablet (10 mg total) by mouth every 6 (six) hours as needed for nausea (nausea/headache). Patient not taking: Reported on 12/29/2018 07/30/18   Margarita Mail, PA-C  MITIGARE 0.6 MG CAPS TAKE 1 CAPSULE BY MOUTH EVERY OTHER DAY 01/15/19   [name], MD  Multiple Vitamin (MULTIVITAMIN WITH MINERALS) TABS tablet Take 1 tablet by mouth at bedtime.    [provider]  Omega-3 Fatty Acids (FISH OIL) 500 MG CAPS Take 500 mg by mouth every morning.     [provider]  omeprazole (PRILOSEC) 20 MG capsule Take 1 capsule by mouth twice daily 11/27/18   [name], MD  PRESCRIPTION MEDICATION Inhale into the lungs at bedtime. CPAP    [provider]  TURMERIC PO Take by mouth every other day.    [provider]     Family History Family History  Problem Relation Age of Onset  . Atrial fibrillation Mother 7       had a stroke    Social History Social History   Tobacco Use  . Smoking status: Former Smoker    Packs/day: 1.00    Years: 10.00    Pack years: 10.00    Types: Cigarettes    Quit date: 04/02/1994    Years since quitting: 24.8  . Smokeless tobacco: Former Systems developer    Types: Chew  . Tobacco comment: "chewed in my teens"  Substance Use Topics  . Alcohol use: No    Alcohol/week: 0.0 standard drinks    Comment: "stopped in 1994"  .  Drug use: No    Types: Marijuana    Comment: "stopped marijuna in 1994"     Allergies   Hydrochlorothiazide-propranolol [propranolol-hctz] and Penicillins   Review of Systems Review of Systems  Constitutional: Positive for fatigue. Negative for chills and fever.  Eyes: Positive for visual disturbance.  Respiratory: Positive for shortness of breath. Negative for cough.   Cardiovascular: Positive for chest pain and palpitations. Negative for leg swelling.  Gastrointestinal: Negative for abdominal pain, nausea and vomiting.  Endocrine: Negative for polyuria.  Neurological: Positive for light-headedness. Negative for syncope and headaches.  All other systems reviewed and are negative.    Physical Exam Updated Vital Signs BP (!) 153/95 (BP Location: Right Arm)   Pulse 89   Temp 97.8 F (36.6 C) (Oral)   Resp 20   SpO2 98%   Physical Exam Vitals signs and nursing note reviewed.  Constitutional:      General: He is not in acute distress.    Appearance: He is well-developed. He is obese. He is not ill-appearing.     Comments: Calm and cooperative  HENT:     Head: Normocephalic and atraumatic.  Eyes:     General: No scleral icterus.       Right eye: No discharge.        Left eye: No discharge.     Conjunctiva/sclera: Conjunctivae normal.     Pupils: Pupils are equal, round, and reactive to light.  Neck:     Musculoskeletal: Normal range  of motion.  Cardiovascular:     Rate and Rhythm: Normal rate. Rhythm irregularly irregular.     Comments: Rate between 80-110 Pulmonary:     Effort: Pulmonary effort is normal. No respiratory distress.     Breath sounds: Normal breath sounds.  Abdominal:     General: There is no distension.     Palpations: Abdomen is soft.     Tenderness: There is no abdominal tenderness.  Musculoskeletal:     Right lower leg: No edema.     Left lower leg: No edema.  Skin:    General: Skin is warm and dry.  Neurological:     Mental Status: He is alert and oriented to person, place, and time.  Psychiatric:        Behavior: Behavior normal.      ED Treatments / Results  Labs (all labs ordered are listed, but only abnormal results are displayed) Labs Reviewed  BASIC METABOLIC PANEL - Abnormal; Notable for the following components:      Result Value   Glucose, Bld 133 (*)    All other components within normal limits  CBC  TROPONIN I (HIGH SENSITIVITY)  PROTIME-INR  TROPONIN I (HIGH SENSITIVITY)    EKG    Radiology Dg Chest 2 View  Result Date: 01/31/2019 CLINICAL DATA:  Chest pain EXAM: CHEST - 2 VIEW COMPARISON:  March 21, 2018. FINDINGS: Lungs are clear. Heart size and pulmonary vascularity are normal. No adenopathy. No bone lesions. IMPRESSION: No edema or consolidation. Electronically Signed   By: Lowella Grip III M.D.   On: 01/31/2019 07:56    Procedures Procedures (including critical care time)  Medications Ordered in ED Medications  diltiazem (CARDIZEM) injection 5 mg (has no administration in time range)  sodium chloride flush (NS) 0.9 % injection 3 mL (3 mLs Intravenous Given 01/31/19 0708)  sodium chloride 0.9 % bolus 1,000 mL (1,000 mLs Intravenous New Bag/Given 01/31/19 0827)     Initial Impression / Assessment and Plan /  ED Course  I have reviewed the triage vital signs and the nursing notes.  Pertinent labs & imaging results that were available during my care  of the patient were reviewed by me and considered in my medical decision making (see chart for details).  53 year old male presents with palpitations and chest pain with a feeling of near syncope and blurry vision.  The symptoms are intermittent and have been going on for weeks but worse yesterday.  Heart rate is ranging between 80s to 110.  He is hypertensive but otherwise vital signs are normal.  Exam is remarkable for morbid obesity but is otherwise normal.  EKG shows rate controlled A. Fib.  Labs, troponin, chest x-ray are pending.   Chest x-ray is negative.  Labs are overall unremarkable.  HS-Troponin is 7.  Reevaluated patient.  Now he endorses a lot of stress in his life.  He states that his grandkids are around all the time because his sons partner left him.  He is also having a lot of stress at work from [employer] and being required to do much more than he used to.  It seems that his symptoms get worse when he is in a stressful situation so suspect anxiety/lifestyle component.  He also notes that he has had weight gain and is going to restart weight watchers.  Doubt ACS or PE and he is anticoagulated. On review of the A. fib clinic note it appears they wanted him to take an extra dose of Cardizem when he felt like he was in A. fib which he has not been doing.  We will give him some fluids and a dose of Cardizem here and see how he feels.  HR and BP is improved however pt still feels like he is in A.fib. 2nd Trop is unchanged. Advised f/u with A.fib clinic.  Final Clinical Impressions(s) / ED Diagnoses   Final diagnoses:  Atrial fibrillation, unspecified type [facility])  Palpitations  Atypical chest pain    ED Discharge Orders    None       Recardo Evangelist, PA-C 01/31/19 [place], Wenda Overland, MD 02/01/19 1239

## 2019-02-01 NOTE — Addendum Note (Signed)
Encounter addended by: Juluis Mire, RN on: 02/01/2019 8:51 AM  Actions taken: Imaging Exam ended

## 2019-02-10 ENCOUNTER — Ambulatory Visit (INDEPENDENT_AMBULATORY_CARE_PROVIDER_SITE_OTHER): Payer: 59 | Admitting: Orthopaedic Surgery

## 2019-02-10 ENCOUNTER — Other Ambulatory Visit: Payer: Self-pay

## 2019-02-10 ENCOUNTER — Ambulatory Visit: Payer: Self-pay

## 2019-02-10 ENCOUNTER — Encounter: Payer: Self-pay | Admitting: Orthopaedic Surgery

## 2019-02-10 DIAGNOSIS — M1712 Unilateral primary osteoarthritis, left knee: Secondary | ICD-10-CM

## 2019-02-10 DIAGNOSIS — Z6841 Body Mass Index (BMI) 40.0 and over, adult: Secondary | ICD-10-CM | POA: Insufficient documentation

## 2019-02-10 DIAGNOSIS — M1711 Unilateral primary osteoarthritis, right knee: Secondary | ICD-10-CM | POA: Diagnosis not present

## 2019-02-10 HISTORY — DX: Body Mass Index (BMI) 40.0 and over, adult: Z684

## 2019-02-10 NOTE — Progress Notes (Signed)
Office Visit Note   Patient: [name]           Date of Birth: Dec 07, 1965           MRN: [id number] Visit Date: 02/10/2019              Requested by: [name], [place],  [place] [address] PCP: [name], MD   Assessment & Plan: Visit Diagnoses:  1. Primary osteoarthritis of right knee   2. Primary osteoarthritis of left knee   3. Body mass index 40.0-44.9, adult ([place])   4. Morbid obesity ([place])     Plan: Impression is right greater than left knee degenerative joint disease.  He does have atrial fibrillation on Eliquis and he recently visited the ED for chest pain.  Work-up was negative.  At this point patient has received much relief from cortisone injections and he has lost weight in the past but this also failed to give him any significant pain relief therefore he is considering a knee replacement.  We did discuss that his BMI is 70 which puts him in a high risk category for knee replacement.  However if he is willing to accept the risks I am willing to proceed with knee replacement.  He will think about this and discuss further with his wife.  He will need preoperative cardiac clearance due to the recent chest pain and visit to the ED should he decide he wants to proceed with a knee replacement.  He would also need to stop his Eliquis ahead of time.  We discussed his target weight in order to achieve a BMI of less than 40. The patient meets the AMA guidelines for Morbid (severe) obesity with a BMI > 40.0 and I have recommended weight loss.  Follow-Up Instructions: Return if symptoms worsen or fail to improve.   Orders:  Orders Placed This Encounter  Procedures  . XR KNEE 3 VIEW RIGHT  . XR Knee 1-2 Views Left   No orders of the defined types were placed in this encounter.     Procedures: No procedures performed   Clinical Data: No additional findings.   Subjective: Chief Complaint  Patient presents with  . Left Knee -  Pain  . Right Knee - Pain    Deryck is a 53 year old gentleman who is well-known to me who comes in for evaluation of mainly his right knee pain.  He also has left knee pain that caused him a constant ache.  Patient having difficulty with ADLs due to the pain.  He takes Tylenol for the pain.  He is on Eliquis for atrial fibrillation.   Review of Systems  Constitutional: Negative.   All other systems reviewed and are negative.    Objective: Vital Signs: There were no vitals taken for this visit.  Physical Exam Vitals signs and nursing note reviewed.  Constitutional:      Appearance: He is well-developed.  Pulmonary:     Effort: Pulmonary effort is normal.  Abdominal:     Palpations: Abdomen is soft.  Skin:    General: Skin is warm.  Neurological:     Mental Status: He is alert and oriented to person, place, and time.  Psychiatric:        Behavior: Behavior normal.        Thought Content: Thought content normal.        Judgment: Judgment normal.     Ortho Exam Bilateral knee exams are stable.  Specialty Comments:  No specialty comments available.  Imaging: Xr Knee 1-2 Views Left  Result Date: 02/10/2019 Tricompartmental degenerative joint disease.  Xr Knee 3 View Right  Result Date: 02/10/2019 Tricompartmental degenerative joint disease.    PMFS History: Patient Active Problem List   Diagnosis Date Noted  . Body mass index 40.0-44.9, adult ([place]) 02/10/2019  . Urinary incontinence 01/02/2018  . Left groin pain 01/02/2018  . Allergic rhinitis 01/02/2018  . Healthcare maintenance 09/12/2017  . Acute nonintractable headache 02/03/2017  . Abnormal stress test   . Symptomatic sinus bradycardia   . Left sided numbness   . Muscle fasciculation   . Primary osteoarthritis of right knee 07/23/2016  . Plantar fasciitis of left foot 05/07/2016  . 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 Acute gout   . Dizziness 03/21/2015  . Shortness of breath 03/21/2015  . OSA on CPAP   . PAF (paroxysmal atrial fibrillation) ([place])   . Palpitations   . Chest pain 03/03/2015  . Left knee pain 10/13/2014  . Atrial fibrillation ([place]) 03/14/2014  . Right knee pain 11/10/2012  . Sinusitis 10/23/2012  . Left hip pain 06/17/2011  . OSA (obstructive sleep apnea) 05/17/2011  . Chronic back pain 10/29/2010  . INSOMNIA 09/19/2008  . Gout 05/13/2007  . Hyperlipidemia 10/02/2006  . Morbid obesity ([place]) 10/02/2006  . Migraine 10/02/2006  . HYPERTENSION, BENIGN SYSTEMIC 10/02/2006  . GASTROESOPHAGEAL REFLUX, NO ESOPHAGITIS 10/02/2006   Past Medical History:  Diagnosis Date  . Arthritis    "knees" (01/22/2017)  . Chronic lower back pain   . GERD (gastroesophageal reflux disease)   . Gout    "no flares in awhile" (01/22/2017)  . History of MRI of cervical spine 09/25/10   normal; left shoulder normal as well  . Hypertension   . Migraine    "~ q 6 months" (01/22/2017)  . Morbid obesity ([place])    "I've lost 43# in 15 weeks w/weight watchers" (01/22/2017)  . OSA on CPAP   . Paroxysmal atrial fibrillation ([facility]) 7/15   chads2vasc score is 1    Family History  Problem Relation Age of Onset  . Atrial fibrillation Mother 80       had a stroke    Past Surgical History:  Procedure Laterality Date  . CARDIAC CATHETERIZATION  05/2005   "no blockage"  . KNEE ARTHROSCOPY Right 02/2013  . LEFT HEART CATH AND CORONARY ANGIOGRAPHY N/A 01/27/2017   Procedure: Left Heart Cath and Coronary Angiography;  Surgeon: Troy Sine, MD;  Location: [facility];  Service: Cardiovascular;  Laterality: N/A;   Social History   Occupational History  . Not on file  Tobacco Use  . Smoking status: Former Smoker    Packs/day: 1.00    Years: 10.00    Pack years: 10.00    Types: Cigarettes    Quit date: 04/02/1994    Years since quitting: 24.8  . Smokeless tobacco: Former Systems developer    Types: Chew  .  Tobacco comment: "chewed in my teens"  Substance and Sexual Activity  . Alcohol use: No    Alcohol/week: 0.0 standard drinks    Comment: "stopped in 1994"  . Drug use: No    Types: Marijuana    Comment: "stopped marijuna in 1994"  . Sexual activity: Yes

## 2019-02-24 ENCOUNTER — Other Ambulatory Visit: Payer: Self-pay | Admitting: Family Medicine

## 2019-03-09 ENCOUNTER — Other Ambulatory Visit: Payer: Self-pay | Admitting: Family Medicine

## 2019-03-10 NOTE — Telephone Encounter (Signed)
LMOVM for pt to call back and schedule an appt. Valari Taylor Kennon Holter, CMA

## 2019-03-10 NOTE — Telephone Encounter (Signed)
Red team, please ask patient to schedule a follow up visit with me. Thanks [name], MD

## 2019-03-22 ENCOUNTER — Ambulatory Visit
Admission: RE | Admit: 2019-03-22 | Discharge: 2019-03-22 | Disposition: A | Discharge status: Home / Self Care | Payer: 59 | Source: Ambulatory Visit | Attending: Family Medicine | Admitting: Family Medicine

## 2019-03-22 ENCOUNTER — Other Ambulatory Visit: Payer: Self-pay

## 2019-03-22 ENCOUNTER — Ambulatory Visit (INDEPENDENT_AMBULATORY_CARE_PROVIDER_SITE_OTHER): Payer: 59 | Admitting: Family Medicine

## 2019-03-22 ENCOUNTER — Encounter: Payer: Self-pay | Admitting: Family Medicine

## 2019-03-22 VITALS — BP 134/88 | HR 59

## 2019-03-22 DIAGNOSIS — M79644 Pain in right finger(s): Secondary | ICD-10-CM

## 2019-03-22 DIAGNOSIS — J309 Allergic rhinitis, unspecified: Secondary | ICD-10-CM

## 2019-03-22 DIAGNOSIS — R739 Hyperglycemia, unspecified: Secondary | ICD-10-CM | POA: Diagnosis not present

## 2019-03-22 DIAGNOSIS — K219 Gastro-esophageal reflux disease without esophagitis: Secondary | ICD-10-CM

## 2019-03-22 DIAGNOSIS — M109 Gout, unspecified: Secondary | ICD-10-CM | POA: Diagnosis not present

## 2019-03-22 DIAGNOSIS — I10 Essential (primary) hypertension: Secondary | ICD-10-CM

## 2019-03-22 LAB — POCT GLYCOSYLATED HEMOGLOBIN (HGB A1C): Hemoglobin A1C: 5.8 % — AB (ref 4.0–5.6)

## 2019-03-22 NOTE — Progress Notes (Signed)
Date of Visit: 03/22/2019   HPI:  [name] presents for routine follow up.   GERD - taking omeprazole 20mg  twice daily. Does not feel he could go down on this right now. Worse with spicy foods. Omeprazole helps keep things controlled.  Back pain - in left lower back. Denies having fever, saddle anesthesia, lower extremity weakness, or problems with stooling or urination. Recently started going to the gym and is noticing pain is better w/ exercise.  Obesity - doing weight watchers & gym, motivated to make changes  R hand pain - chronic pain in 3rd & 4th digits of R hand. No preceding injuries. Worse with lifting weights. He wondered if it was gout. Ongoing for months. No acute red swelling.  L shoulder pain - notices it especially with lifting weights while abducting shoulder, also when he gets up in the morning.  afib - taking tikosyn, also eliquis. Has upcoming appointment with cardiology afib clinic. No bleeding.  Allergies - taking zyrtec & flonase with improvement in symptoms.   ROS: See HPI.  [place]: history of gout, hyperlipidemia, obesity, hypertension, GERD, afib, OSA  PHYSICAL EXAM: BP 134/88   Pulse (!) 59   SpO2 97%  Gen: no acute distress, pleasant, cooperative HEENT: normocephalic, atraumatic  Heart: regular rate and rhythm  Lungs: normal work of breathing  Neuro: alert, grossly nonfocal speech normal Ext: L shoulder - full active abduction. Neg empty can. + neers. No tenderness to palpation R hand - no warmth, erythema, or swelling of fingers of R hand. nontender to palpation.  ASSESSMENT/PLAN:  Health maintenance:  -UTD on HM items  Morbid obesity ([place]) Encouraged efforts at weight loss  Gout On colchicine for gout, no recent flares. Do not think the problem in his fingers is gout - no signs of acute flare, no tophi Patient interested in imaging to look into what the issue is Will check xray to assess for osteoarthritis  GASTROESOPHAGEAL REFLUX, NO  ESOPHAGITIS Stable on omeprazole. Consider decreasing dose at future visit if patient able to tolerate.  HYPERTENSION, BENIGN SYSTEMIC Normotensive off antihypertensives.  Allergic rhinitis Sinus symptoms improved on zyrtec & as needed flonase. Continue.  L shoulder pain - suspect rotator cuff impingement. Given handout on rehab exercises.  Prior elevated glucose - check A1c today  FOLLOW UP: Follow up in 3 mos for routine care  [name], [place]

## 2019-03-22 NOTE — Patient Instructions (Signed)
It was great to see you again today!  Checking for diabetes today Go get xrays of your hand See exercises below for your shoulder  Follow up with me in 3 months   Be well, Dr. Ardelia Mems       Shoulder Impingement Syndrome Rehab Ask your health care provider which exercises are safe for you. Do exercises exactly as told by your health care provider and adjust them as directed. It is normal to feel mild stretching, pulling, tightness, or discomfort as you do these exercises. Stop right away if you feel sudden pain or your pain gets worse. Do not begin these exercises until told by your health care provider. Stretching and range-of-motion exercise This exercise warms up your muscles and joints and improves the movement and flexibility of your shoulder. This exercise also helps to relieve pain and stiffness. Passive horizontal adduction In passive adduction, you use your other hand to move the injured arm toward your body. The injured arm does not move on its own. In this movement, your arm is moved across your body in the horizontal plane (horizontal adduction). 1. Sit or stand and pull your left / right elbow across your chest, toward your other shoulder. Stop when you feel a gentle stretch in the back of your shoulder and upper arm. ? Keep your arm at shoulder height. ? Keep your arm as close to your body as you comfortably can. 2. Hold for __________ seconds. 3. Slowly return to the starting position. Repeat __________ times. Complete this exercise __________ times a day. Strengthening exercises These exercises build strength and endurance in your shoulder. Endurance is the ability to use your muscles for a long time, even after they get tired. External rotation, isometric This is an exercise in which you press the back of your wrist against a door frame without moving your shoulder joint (isometric). 1. Stand or sit in a doorway, facing the door frame. 2. Bend your left / right elbow  and place the back of your wrist against the door frame. Only the back of your wrist should be touching the frame. Keep your upper arm at your side. 3. Gently press your wrist against the door frame, as if you are trying to push your arm away from your abdomen (external rotation). Press as hard as you are able without pain. ? Avoid shrugging your shoulder while you press your wrist against the door frame. Keep your shoulder blade tucked down toward the middle of your back. 4. Hold for __________ seconds. 5. Slowly release the tension, and relax your muscles completely before you repeat the exercise. Repeat __________ times. Complete this exercise __________ times a day. Internal rotation, isometric This is an exercise in which you press your palm against a door frame without moving your shoulder joint (isometric). 1. Stand or sit in a doorway, facing the door frame. 2. Bend your left / right elbow and place the palm of your hand against the door frame. Only your palm should be touching the frame. Keep your upper arm at your side. 3. Gently press your hand against the door frame, as if you are trying to push your arm toward your abdomen (internal rotation). Press as hard as you are able without pain. ? Avoid shrugging your shoulder while you press your hand against the door frame. Keep your shoulder blade tucked down toward the middle of your back. 4. Hold for __________ seconds. 5. Slowly release the tension, and relax your muscles completely before you repeat  the exercise. Repeat __________ times. Complete this exercise __________ times a day. Scapular protraction, supine  1. Lie on your back on a firm surface (supine position). Hold a __________ weight in your left / right hand. 2. Raise your left / right arm straight into the air so your hand is directly above your shoulder joint. 3. Push the weight into the air so your shoulder (scapula) lifts off the surface that you are lying on. The scapula  will push up or forward (protraction). Do not move your head, neck, or back. 4. Hold for __________ seconds. 5. Slowly return to the starting position. Let your muscles relax completely before you repeat this exercise. Repeat __________ times. Complete this exercise __________ times a day. Scapular retraction  1. Sit in a stable chair without armrests, or stand up. 2. Secure an exercise band to a stable object in front of you so the band is at shoulder height. 3. Hold one end of the exercise band in each hand. Your palms should face down. 4. Squeeze your shoulder blades together (retraction) and move your elbows slightly behind you. Do not shrug your shoulders upward while you do this. 5. Hold for __________ seconds. 6. Slowly return to the starting position. Repeat __________ times. Complete this exercise __________ times a day. Shoulder extension  1. Sit in a stable chair without armrests, or stand up. 2. Secure an exercise band to a stable object in front of you so the band is above shoulder height. 3. Hold one end of the exercise band in each hand. 4. Straighten your elbows and lift your hands up to shoulder height. 5. Squeeze your shoulder blades together and pull your hands down to the sides of your thighs (extension). Stop when your hands are straight down by your sides. Do not let your hands go behind your body. 6. Hold for __________ seconds. 7. Slowly return to the starting position. Repeat __________ times. Complete this exercise __________ times a day. This information is not intended to replace advice given to you by your health care provider. Make sure you discuss any questions you have with your health care provider. Document Released: 07/22/2005 Document Revised: 11/13/2018 Document Reviewed: 08/17/2018 Elsevier Patient Education  2020 [employer].

## 2019-03-23 NOTE — Assessment & Plan Note (Signed)
Sinus symptoms improved on zyrtec & as needed flonase. Continue.

## 2019-03-23 NOTE — Assessment & Plan Note (Signed)
Normotensive off antihypertensives.

## 2019-03-23 NOTE — Assessment & Plan Note (Addendum)
On colchicine for gout, no recent flares. Do not think the problem in his fingers is gout - no signs of acute flare, no tophi Patient interested in imaging to look into what the issue is Will check xray to assess for osteoarthritis

## 2019-03-23 NOTE — Assessment & Plan Note (Signed)
Stable on omeprazole. Consider decreasing dose at future visit if patient able to tolerate.

## 2019-03-23 NOTE — Assessment & Plan Note (Signed)
Encouraged efforts at weight loss

## 2019-03-24 ENCOUNTER — Encounter: Payer: Self-pay | Admitting: Family Medicine

## 2019-03-29 ENCOUNTER — Other Ambulatory Visit: Payer: Self-pay

## 2019-03-29 ENCOUNTER — Ambulatory Visit (HOSPITAL_COMMUNITY)
Admission: RE | Admit: 2019-03-29 | Discharge: 2019-03-29 | Disposition: A | Discharge status: Home / Self Care | Payer: 59 | Source: Ambulatory Visit | Attending: Nurse Practitioner | Admitting: Nurse Practitioner

## 2019-03-29 ENCOUNTER — Encounter (HOSPITAL_COMMUNITY): Payer: Self-pay | Admitting: Nurse Practitioner

## 2019-03-29 VITALS — BP 140/86 | HR 65 | Ht 74.0 in | Wt 336.2 lb

## 2019-03-29 DIAGNOSIS — G4733 Obstructive sleep apnea (adult) (pediatric): Secondary | ICD-10-CM | POA: Insufficient documentation

## 2019-03-29 DIAGNOSIS — I480 Paroxysmal atrial fibrillation: Secondary | ICD-10-CM | POA: Insufficient documentation

## 2019-03-29 DIAGNOSIS — Z79899 Other long term (current) drug therapy: Secondary | ICD-10-CM | POA: Insufficient documentation

## 2019-03-29 DIAGNOSIS — Z87891 Personal history of nicotine dependence: Secondary | ICD-10-CM | POA: Diagnosis not present

## 2019-03-29 DIAGNOSIS — Z7901 Long term (current) use of anticoagulants: Secondary | ICD-10-CM | POA: Diagnosis not present

## 2019-03-29 DIAGNOSIS — I10 Essential (primary) hypertension: Secondary | ICD-10-CM | POA: Insufficient documentation

## 2019-03-29 DIAGNOSIS — K219 Gastro-esophageal reflux disease without esophagitis: Secondary | ICD-10-CM | POA: Insufficient documentation

## 2019-03-29 DIAGNOSIS — M1990 Unspecified osteoarthritis, unspecified site: Secondary | ICD-10-CM | POA: Diagnosis not present

## 2019-03-29 DIAGNOSIS — Z6841 Body Mass Index (BMI) 40.0 and over, adult: Secondary | ICD-10-CM | POA: Insufficient documentation

## 2019-03-29 DIAGNOSIS — E669 Obesity, unspecified: Secondary | ICD-10-CM | POA: Diagnosis not present

## 2019-03-29 DIAGNOSIS — Z888 Allergy status to other drugs, medicaments and biological substances status: Secondary | ICD-10-CM | POA: Diagnosis not present

## 2019-03-29 DIAGNOSIS — Z880 Allergy status to penicillin: Secondary | ICD-10-CM | POA: Diagnosis not present

## 2019-03-29 LAB — BASIC METABOLIC PANEL
Anion gap: 10 (ref 5–15)
BUN: 13 mg/dL (ref 6–20)
CO2: 25 mmol/L (ref 22–32)
Calcium: 9.3 mg/dL (ref 8.9–10.3)
Chloride: 108 mmol/L (ref 98–111)
Creatinine, Ser: 1.06 mg/dL (ref 0.61–1.24)
GFR calc Af Amer: >60 mL/min (ref >60)
GFR calc non Af Amer: >60 mL/min (ref >60)
Glucose, Bld: 111 mg/dL — ABNORMAL HIGH (ref 70–99)
Potassium: 4.3 mmol/L (ref 3.5–5.1)
Sodium: 143 mmol/L (ref 135–145)

## 2019-03-29 LAB — MAGNESIUM: Magnesium: 2.2 mg/dL (ref 1.7–2.4)

## 2019-03-29 NOTE — Progress Notes (Signed)
Patient ID: [name], male   DOB: 13-Dec-1965, 53 y.o.   MRN: [id number]     Primary Care Physician: [name], MD Referring Physician: Dr. Rayann Heman, [facility] f/u Cardiologist: Dr. Leonides Cave is a 53 y.o. male with a h/o PAF on  tikosyn. He feels that mostly he is staying in [place]. He did have afib last night after eating a high salt dinner at [facility]. He is in normal rhythm today. He is being compliant with Tikosyn/ eliquis. No bleeding issues.  He is using cpap. He is back at [employer] on the machines with social distancing in place.   Today, he denies symptoms of palpitations, chest pain, shortness of breath, orthopnea, PND, lower extremity edema, dizziness, presyncope, syncope, or neurologic sequela. Positive for back pain.The patient is tolerating medications without difficulties and is otherwise without complaint today.   Past Medical History:  Diagnosis Date  . Arthritis    "knees" (01/22/2017)  . Chronic lower back pain   . GERD (gastroesophageal reflux disease)   . Gout    "no flares in awhile" (01/22/2017)  . History of MRI of cervical spine 09/25/10   normal; left shoulder normal as well  . Hypertension   . Migraine    "~ q 6 months" (01/22/2017)  . Morbid obesity ([place])    "I've lost 43# in 15 weeks w/weight watchers" (01/22/2017)  . OSA on CPAP   . Paroxysmal atrial fibrillation ([place]) 7/15   chads2vasc score is 1   Past Surgical History:  Procedure Laterality Date  . CARDIAC CATHETERIZATION  05/2005   "no blockage"  . KNEE ARTHROSCOPY Right 02/2013  . LEFT HEART CATH AND CORONARY ANGIOGRAPHY N/A 01/27/2017   Procedure: Left Heart Cath and Coronary Angiography;  Surgeon: Troy Sine, MD;  Location: [facility];  Service: Cardiovascular;  Laterality: N/A;    Current Outpatient Medications  Medication Sig Dispense Refill  . atorvastatin (LIPITOR) 20 MG tablet Take 1 tablet by mouth once daily 90 tablet 0  . cetirizine (ZYRTEC) 10 MG tablet Take  1 tablet (10 mg total) by mouth daily. 30 tablet 11  . diltiazem (CARDIZEM) 30 MG tablet Take 1 tablet every 4 hours AS NEEDED for elevated heart rate (Patient taking differently: Take 30 mg by mouth every 4 (four) hours as needed. AS NEEDED for elevated heart rate) 30 tablet 1  . dofetilide (TIKOSYN) 500 MCG capsule Take 1 capsule (500 mcg total) by mouth 2 (two) times daily. 60 capsule 11  . ELIQUIS 5 MG TABS tablet Take 1 tablet by mouth twice daily 180 tablet 0  . fluticasone (FLONASE) 50 MCG/ACT nasal spray Place 2 sprays into both nostrils daily. (Patient taking differently: Place 2 sprays into both nostrils daily as needed. ) 16 g 6  . Hypromellose ([employer] GENTLE LUBRICANT EYE DROPS OP) Place 1 drop into both eyes daily as needed (dry eyes).    Marland Kitchen MITIGARE 0.6 MG CAPS TAKE 1 CAPSULE BY MOUTH EVERY OTHER DAY 15 capsule 0  . Multiple Vitamin (MULTIVITAMIN WITH MINERALS) TABS tablet Take 1 tablet by mouth at bedtime.    . Omega-3 Fatty Acids (FISH OIL) 500 MG CAPS Take 500 mg by mouth every morning.     Marland Kitchen omeprazole (PRILOSEC) 20 MG capsule Take 1 capsule by mouth twice daily 180 capsule 0  . PRESCRIPTION MEDICATION Inhale into the lungs at bedtime. CPAP    . TURMERIC PO Take by mouth every other day.  No current facility-administered medications for this encounter.     Allergies  Allergen Reactions  . Hydrochlorothiazide-Propranolol [Propranolol-Hctz] Other (See Comments)    Joints lock up  . Penicillins Other (See Comments)    Hives Makes joints stiff and unable to move Has patient had a PCN reaction causing immediate rash, facial/tongue/throat swelling, SOB or lightheadedness with hypotension: No Has patient had a PCN reaction causing severe rash involving mucus membranes or skin necrosis: No Has patient had a PCN reaction that required hospitalization No Has patient had a PCN reaction occurring within the last 10 years: No If all of the above answers are "NO", then may proceed with  Cephalosporin use.    Social History   Socioeconomic History  . Marital status: Married    Spouse name: Not on file  . Number of children: Not on file  . Years of education: Not on file  . Highest education level: Not on file  Occupational History  . Not on file  Social Needs  . Financial resource strain: Not on file  . Food insecurity    Worry: Not on file    Inability: Not on file  . Transportation needs    Medical: Not on file    Non-medical: Not on file  Tobacco Use  . Smoking status: Former Smoker    Packs/day: 1.00    Years: 10.00    Pack years: 10.00    Types: Cigarettes    Quit date: 04/02/1994    Years since quitting: 25.0  . Smokeless tobacco: Former Systems developer    Types: Chew  . Tobacco comment: "chewed in my teens"  Substance and Sexual Activity  . Alcohol use: No    Alcohol/week: 0.0 standard drinks    Comment: "stopped in 1994"  . Drug use: No    Types: Marijuana    Comment: "stopped marijuna in 1994"  . Sexual activity: Yes  Lifestyle  . Physical activity    Days per week: Not on file    Minutes per session: Not on file  . Stress: Not on file  Relationships  . Social Herbalist on phone: Not on file    Gets together: Not on file    Attends religious service: Not on file    Active member of club or organization: Not on file    Attends meetings of clubs or organizations: Not on file    Relationship status: Not on file  . Intimate partner violence    Fear of current or ex partner: Not on file    Emotionally abused: Not on file    Physically abused: Not on file    Forced sexual activity: Not on file  Other Topics Concern  . Not on file  Social History Narrative   Lives in [place] with spouse.  3 children, all grown.   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 Atrial fibrillation Mother 41       had a stroke    ROS- All systems are reviewed and negative except  as per the HPI above  Physical Exam: There were no vitals filed for this visit.  GEN- The patient is well appearing, alert and oriented x 3 today.   Head- normocephalic, atraumatic Eyes-  Sclera clear, conjunctiva pink Ears- hearing intact Oropharynx- clear Neck- supple, no JVP Lymph- no cervical lymphadenopathy Lungs- Clear to ausculation bilaterally, normal work of breathing  Heart- Regular rate and rhythm, no murmurs, rubs or gallops, PMI not laterally displaced GI- soft, NT, ND, + BS Extremities- no clubbing, cyanosis, or edema MS- no significant deformity or atrophy Skin- no rash or lesion Psych- euthymic mood, full affect Neuro- strength and sensation are intact  EKG-Sinus brady at 65 bpm, Pr int 178 ms, QRS int 92  ms, Qtc 420 ms Epic records reviewed  Assessment and Plan: 1. afib Continue  dofetilide 500 mg bid, qtc stable Pt encouraged to try to take it at the same time whether working or not Continue eliquis  Continue  cpap Bmet/mag checked today  2. Obesity Continue weight loss efforts Avoid salt   3. HTN Stable     F/u in clinic in 4 months for Tikosyn surveillance   Butch Penny C. Draycen Leichter, [facility] [address] [place], [place] [address] [phone]

## 2019-03-31 ENCOUNTER — Ambulatory Visit (HOSPITAL_COMMUNITY)
Admission: EM | Admit: 2019-03-31 | Discharge: 2019-03-31 | Disposition: A | Discharge status: Home / Self Care | Payer: 59 | Attending: Family Medicine | Admitting: Family Medicine

## 2019-03-31 ENCOUNTER — Encounter (HOSPITAL_COMMUNITY): Payer: Self-pay

## 2019-03-31 ENCOUNTER — Other Ambulatory Visit: Payer: Self-pay

## 2019-03-31 DIAGNOSIS — M5412 Radiculopathy, cervical region: Secondary | ICD-10-CM | POA: Diagnosis not present

## 2019-03-31 MED ORDER — PREDNISONE 10 MG (21) PO TBPK: ORAL_TABLET | ORAL | 0 refills | Status: DC

## 2019-03-31 MED ORDER — TIZANIDINE HCL 4 MG PO TABS: 4.0000 mg | ORAL_TABLET | Freq: Four times a day (QID) | ORAL | 0 refills | Status: DC | PRN

## 2019-03-31 NOTE — Discharge Instructions (Signed)
Nothing concerning on your EKG today. Believe most likely this is some sort of pinched nerve with no inflammation causing your pain. We will treat this with prednisone taper over the next 6 days.  Make sure you take this with food. Zanaflex as needed for muscle relaxant For any continued or worsening symptoms you will need to follow-up. For more severe symptoms to include chest pain, shortness of breath you need to go to the ER

## 2019-03-31 NOTE — ED Triage Notes (Signed)
Patient presents to Urgent Care with complaints of left shoulder and neck pain since lifting something heavy 3 days ago. Patient reports he had to leave work early yesterday due to the pain in his arm w / movement.

## 2019-03-31 NOTE — ED Provider Notes (Signed)
[facility]    CSN: [id number] Arrival date & time: 03/31/19  [id number]      History   Chief Complaint Chief Complaint  Patient presents with  . Neck Pain    HPI [name] is a 53 y.o. male.   Patient is a 53 year old male with past medical history of arthritis, chronic low back pain, GERD, gout, hypertension, morbid obesity, OSA on CPAP, proximal A. Fib.  He is presenting today with approximately 3 days of left neck pain radiating down the left arm with left shoulder pain.  Reported this started after lifting something heavy at work approximately 3 days ago.  He had to leave work early yesterday due to the pain in his arm with movement.  There is some associated numbness and tingling in the arm.  Denies any chest pain or shortness of breath. No specific injury.      Past Medical History:  Diagnosis Date  . Arthritis    "knees" (01/22/2017)  . Chronic lower back pain   . GERD (gastroesophageal reflux disease)   . Gout    "no flares in awhile" (01/22/2017)  . History of MRI of cervical spine 09/25/10   normal; left shoulder normal as well  . Hypertension   . Migraine    "~ q 6 months" (01/22/2017)  . Morbid obesity ([place])    "I've lost 43# in 15 weeks w/weight watchers" (01/22/2017)  . OSA on CPAP   . Paroxysmal atrial fibrillation ([place]) 7/15   chads2vasc score is 1    Patient Active Problem List   Diagnosis Date Noted  . Body mass index 40.0-44.9, adult ([place]) 02/10/2019  . Urinary incontinence 01/02/2018  . Left groin pain 01/02/2018  . Allergic rhinitis 01/02/2018  . Healthcare maintenance 09/12/2017  . Acute nonintractable headache 02/03/2017  . Abnormal stress test   . Symptomatic sinus bradycardia   . Left sided numbness   . Muscle fasciculation   . Primary osteoarthritis of right knee 07/23/2016  . Plantar fasciitis of left foot 05/07/2016  . 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 Degenerative joint disease (DJD) of lumbar  spine 01/05/2016  . Dizziness 03/21/2015  . Shortness of breath 03/21/2015  . OSA on CPAP   . PAF (paroxysmal atrial fibrillation) ([place])   . Palpitations   . Chest pain 03/03/2015  . Left knee pain 10/13/2014  . Atrial fibrillation ([place]) 03/14/2014  . Right knee pain 11/10/2012  . Sinusitis 10/23/2012  . Left hip pain 06/17/2011  . OSA (obstructive sleep apnea) 05/17/2011  . Chronic back pain 10/29/2010  . INSOMNIA 09/19/2008  . Gout 05/13/2007  . Hyperlipidemia 10/02/2006  . Morbid obesity ([place]) 10/02/2006  . Migraine 10/02/2006  . HYPERTENSION, BENIGN SYSTEMIC 10/02/2006  . GASTROESOPHAGEAL REFLUX, NO ESOPHAGITIS 10/02/2006    Past Surgical History:  Procedure Laterality Date  . CARDIAC CATHETERIZATION  05/2005   "no blockage"  . KNEE ARTHROSCOPY Right 02/2013  . LEFT HEART CATH AND CORONARY ANGIOGRAPHY N/A 01/27/2017   Procedure: Left Heart Cath and Coronary Angiography;  Surgeon: Troy Sine, MD;  Location: [facility];  Service: Cardiovascular;  Laterality: N/A;       Home Medications    Prior to Admission medications   Medication Sig Start Date End Date Taking? Authorizing Provider  atorvastatin (LIPITOR) 20 MG tablet Take 1 tablet by mouth once daily 01/25/19   Nahser, Wonda Cheng, MD  cetirizine (ZYRTEC) 10 MG tablet Take 1 tablet (10 mg total)  by mouth daily. 09/14/18   [name], MD  diltiazem (CARDIZEM) 30 MG tablet Take 1 tablet every 4 hours AS NEEDED for elevated heart rate Patient taking differently: Take 30 mg by mouth every 4 (four) hours as needed. AS NEEDED for elevated heart rate 08/21/17   Sherran Needs, NP  dofetilide (TIKOSYN) 500 MCG capsule Take 1 capsule (500 mcg total) by mouth 2 (two) times daily. 06/22/18   Nahser, Wonda Cheng, MD  ELIQUIS 5 MG TABS tablet Take 1 tablet by mouth twice daily 02/24/19   Martyn Malay, MD  fluticasone [facility]) 50 MCG/ACT nasal spray Place 2 sprays into both nostrils daily. Patient taking  differently: Place 2 sprays into both nostrils daily as needed.  09/14/18   [name], MD  Hypromellose ([employer] GENTLE LUBRICANT EYE DROPS OP) Place 1 drop into both eyes daily as needed (dry eyes).    [provider]  MITIGARE 0.6 MG CAPS TAKE 1 CAPSULE BY MOUTH EVERY OTHER DAY 01/15/19   [name], MD  Multiple Vitamin (MULTIVITAMIN WITH MINERALS) TABS tablet Take 1 tablet by mouth at bedtime.    [provider]  Omega-3 Fatty Acids (FISH OIL) 500 MG CAPS Take 500 mg by mouth every morning.     [provider]  omeprazole (PRILOSEC) 20 MG capsule Take 1 capsule by mouth twice daily 03/10/19   [name], MD  predniSONE (STERAPRED UNI-PAK 21 TAB) 10 MG (21) TBPK tablet 6 tabs for 1 day, then 5 tabs for 1 das, then 4 tabs for 1 day, then 3 tabs for 1 day, 2 tabs for 1 day, then 1 tab for 1 day 03/31/19   Loura Halt A, NP  PRESCRIPTION MEDICATION Inhale into the lungs at bedtime. CPAP    [provider]  tiZANidine (ZANAFLEX) 4 MG tablet Take 1 tablet (4 mg total) by mouth every 6 (six) hours as needed for muscle spasms. 03/31/19   Loura Halt A, NP  TURMERIC PO Take by mouth every other day.    [provider]    Family History Family History  Problem Relation Age of Onset  . Atrial fibrillation Mother 66       had a stroke  . Hypertension Father   . Diabetes Father     Social History Social History   Tobacco Use  . Smoking status: Former Smoker    Packs/day: 1.00    Years: 10.00    Pack years: 10.00    Types: Cigarettes    Quit date: 04/02/1994    Years since quitting: 25.0  . Smokeless tobacco: Former Systems developer    Types: Chew  . Tobacco comment: "chewed in my teens"  Substance Use Topics  . Alcohol use: No    Alcohol/week: 0.0 standard drinks    Comment: "stopped in 1994"  . Drug use: Not Currently    Types: Marijuana    Comment: "stopped marijuna in 1994"     Allergies   Hydrochlorothiazide-propranolol  [propranolol-hctz] and Penicillins   Review of Systems Review of Systems   Physical Exam Triage Vital Signs ED Triage Vitals  Enc Vitals Group     BP 03/31/19 0832 (!) 136/94     Pulse Rate 03/31/19 0832 63     Resp 03/31/19 0832 16     Temp 03/31/19 0832 98.6 F (37 C)     Temp Source 03/31/19 0832 Temporal     SpO2 03/31/19 0832 100 %  Weight --      Height --      Head Circumference --      Peak Flow --      Pain Score 03/31/19 0842 9     Pain Loc --      Pain Edu? --      Excl. in [place]? --    No data found.  Updated Vital Signs BP (!) 136/94 (BP Location: Left Arm)   Pulse 63   Temp 98.6 F (37 C) (Temporal)   Resp 16   SpO2 100%   Visual Acuity Right Eye Distance:   Left Eye Distance:   Bilateral Distance:    Right Eye Near:   Left Eye Near:    Bilateral Near:     Physical Exam Vitals signs and nursing note reviewed.  Constitutional:      General: He is not in acute distress.    Appearance: Normal appearance. He is obese. He is not ill-appearing, toxic-appearing or diaphoretic.  HENT:     Head: Normocephalic and atraumatic.     Nose: Nose normal.  Eyes:     Conjunctiva/sclera: Conjunctivae normal.  Neck:     Musculoskeletal: Normal range of motion.     Comments: Mild tenderness to palpation of the left lateral neck area.  Some tenderness with rotation to the right Cardiovascular:     Rate and Rhythm: Normal rate and regular rhythm.     Pulses: Normal pulses.     Heart sounds: Normal heart sounds.  Pulmonary:     Effort: Pulmonary effort is normal.     Breath sounds: Normal breath sounds.  Musculoskeletal: Normal range of motion.        General: Tenderness present.     Comments: Tenderness with palpation to left trapezius muscles No bony tenderness of the shoulder.  Good range of motion of the shoulder. Mild tenderness to palpation of left chest wall   Lymphadenopathy:     Cervical: No cervical adenopathy.  Skin:    General: Skin is warm  and dry.  Neurological:     Mental Status: He is alert.  Psychiatric:        Mood and Affect: Mood normal.      UC Treatments / Results  Labs (all labs ordered are listed, but only abnormal results are displayed) Labs Reviewed - No data to display  EKG   Radiology No results found.  Procedures Procedures (including critical care time)  Medications Ordered in UC Medications - No data to display  Initial Impression / Assessment and Plan / UC Course  I have reviewed the triage vital signs and the nursing notes.  Pertinent labs & imaging results that were available during my care of the patient were reviewed by me and considered in my medical decision making (see chart for details).     Patient is a 53 year old male with past medical history of A. fib and obesity.  Here  with left neck pain with radiation down the left arm and heaviness. EKG was normal sinus rhythm.  Similar to previous EKG. No concern for ACS at this time. Most likely some cervical radiculopathy. Treat with prednisone taper the next 6 days. Tizanidine for muscle relaxant. Recommended gentle stretching, heat and massage For any continued worsening symptoms he will need to follow-up For worsening symptoms to include chest pain, shortness of breath he will need to go the ER. Final Clinical Impressions(s) / UC Diagnoses   Final diagnoses:  Cervical radiculopathy  Discharge Instructions     Nothing concerning on your EKG today. Believe most likely this is some sort of pinched nerve with no inflammation causing your pain. We will treat this with prednisone taper over the next 6 days.  Make sure you take this with food. Zanaflex as needed for muscle relaxant For any continued or worsening symptoms you will need to follow-up. For more severe symptoms to include chest pain, shortness of breath you need to go to the ER    ED Prescriptions    Medication Sig Dispense Auth. Provider   predniSONE  (STERAPRED UNI-PAK 21 TAB) 10 MG (21) TBPK tablet 6 tabs for 1 day, then 5 tabs for 1 das, then 4 tabs for 1 day, then 3 tabs for 1 day, 2 tabs for 1 day, then 1 tab for 1 day 21 tablet Joneric Streight A, NP   tiZANidine (ZANAFLEX) 4 MG tablet Take 1 tablet (4 mg total) by mouth every 6 (six) hours as needed for muscle spasms. 30 tablet Loura Halt A, NP     Controlled Substance Prescriptions [place] Controlled Substance Registry consulted? Not Applicable   Orvan July, NP 03/31/19 1022

## 2019-04-05 NOTE — Addendum Note (Signed)
Encounter addended by: Sherran Needs, NP on: 04/05/2019 8:38 AM  Actions taken: Level of Service modified

## 2019-05-03 ENCOUNTER — Other Ambulatory Visit: Payer: Self-pay | Admitting: Cardiovascular Disease

## 2019-05-10 ENCOUNTER — Ambulatory Visit: Payer: 59 | Admitting: Family Medicine

## 2019-05-10 ENCOUNTER — Encounter: Payer: Self-pay | Admitting: Family Medicine

## 2019-05-10 ENCOUNTER — Other Ambulatory Visit: Payer: Self-pay

## 2019-05-10 VITALS — BP 138/80 | HR 65

## 2019-05-10 DIAGNOSIS — G4733 Obstructive sleep apnea (adult) (pediatric): Secondary | ICD-10-CM

## 2019-05-10 DIAGNOSIS — Z23 Encounter for immunization: Secondary | ICD-10-CM

## 2019-05-10 DIAGNOSIS — Z9989 Dependence on other enabling machines and devices: Secondary | ICD-10-CM | POA: Diagnosis not present

## 2019-05-10 DIAGNOSIS — M62838 Other muscle spasm: Secondary | ICD-10-CM | POA: Diagnosis not present

## 2019-05-10 MED ORDER — BACLOFEN 10 MG PO TABS: 10.0000 mg | ORAL_TABLET | Freq: Two times a day (BID) | ORAL | 0 refills | Status: DC | PRN

## 2019-05-10 NOTE — Assessment & Plan Note (Signed)
Compliant with CPAP at 4cm H2O. He will have home health company send orders to [place] for new equipment.

## 2019-05-10 NOTE — Progress Notes (Signed)
Date of Visit: 05/10/2019   HPI:  Patient presents to discuss neck pain.  Neck pain - seen at UC at end of August after acute onset of L sided neck pain that occurred while carrying a heavy crate of water at home. Initially had tingling/numbness and pain going down L shoudler and L arm. Was prescribed prednisone taper and tizanidine by urgent care. Did well on this initially but symptoms have gradually returned. Has symptoms all the time, worse when he turns his head to the R side. Pain primarily now on the L neck and down into L shoulder, no longer has any pain, numbness, or tingling in to the L arm. Denies having any weakness in his arms. 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 Tried tramadol which made him sleepy. Has avoided going to the gym because he's worried he will hurt something else.  OSA - compliant with CPAP every night. Last sleep study in 2012. Current setting is 4cm H2O. This helps him sleep. He is satisifed with his current settings.   ROS: See HPI.  [place]: history of afib, GERD, gout, hyperlipidemia, hypertension, obesity, OSA  PHYSICAL EXAM: BP 138/80   Pulse 65   SpO2 98%  Gen: no acute distress, pleasant, cooperative HEENT: normocephalic, atraumatic. L neck is tender to palpation in trapezius area. Full strength with bilateral grip strenght, as well as shoulder abduction and adduction. Elbow extension & flexion 5/5 as well. Sensation intact over LUE.   ASSESSMENT/PLAN:  Health maintenance:  -flu shot given today  OSA on CPAP Compliant with CPAP at 4cm H2O. He will have home health company send orders to [place] for new equipment.   Neck muscle spasm Recurrent. No weakness or neuro symptoms to raise concern for active radiculopathy. Pain primarily located in the muscle. Rx baclofen (advised to use this when not working since he operates heavy machinery). Also trial of physical therapy. Patient  will let us know if this does not work, at that point would consider imaging.  FOLLOW UP: Follow up in 3 months for routine medical issues Referring to physical therapy   [name], [place]

## 2019-05-10 NOTE — Patient Instructions (Signed)
It was great to see you again today!  Sent in baclofen for you to take for your neck Also referring to physical therapy Call if these aren't helping  Have them send orders for CPAP to [place] and I will sign them.  Next visit with me in 3 months for routine issues  Be well, Dr. Ardelia Mems

## 2019-05-17 ENCOUNTER — Other Ambulatory Visit: Payer: Self-pay

## 2019-05-17 ENCOUNTER — Ambulatory Visit: Payer: 59 | Attending: Family Medicine

## 2019-05-17 DIAGNOSIS — R252 Cramp and spasm: Secondary | ICD-10-CM | POA: Diagnosis present

## 2019-05-17 DIAGNOSIS — R293 Abnormal posture: Secondary | ICD-10-CM | POA: Insufficient documentation

## 2019-05-17 DIAGNOSIS — M542 Cervicalgia: Secondary | ICD-10-CM | POA: Diagnosis present

## 2019-05-17 NOTE — Therapy (Signed)
[place], [place], [address] Phone: [phone]   Fax:  [phone]  Physical Therapy Evaluation  Patient Details  Name: [name] MRN: [id number] Date of Birth: January 15, 1966 Referring Provider (PT): Chrisandra Netters, MD   Encounter Date: 05/17/2019  PT End of Session - 05/17/19 0917    Visit Number  1    Number of Visits  12    Date for PT Re-Evaluation  06/25/19    Authorization Type  [employer]    PT Start Time  [phone]    PT Stop Time  1000    PT Time Calculation (min)  44 min    Activity Tolerance  Patient tolerated treatment well    Behavior During Therapy  [facility] for tasks assessed/performed       Past Medical History:  Diagnosis Date  . Arthritis    "knees" (01/22/2017)  . Chronic lower back pain   . GERD (gastroesophageal reflux disease)   . Gout    "no flares in awhile" (01/22/2017)  . History of MRI of cervical spine 09/25/10   normal; left shoulder normal as well  . Hypertension   . Migraine    "~ q 6 months" (01/22/2017)  . Morbid obesity ([place])    "I've lost 43# in 15 weeks w/weight watchers" (01/22/2017)  . OSA on CPAP   . Paroxysmal atrial fibrillation ([employer]) 7/15   chads2vasc score is 1    Past Surgical History:  Procedure Laterality Date  . CARDIAC CATHETERIZATION  05/2005   "no blockage"  . KNEE ARTHROSCOPY Right 02/2013  . LEFT HEART CATH AND CORONARY ANGIOGRAPHY N/A 01/27/2017   Procedure: Left Heart Cath and Coronary Angiography;  Surgeon: Troy Sine, MD;  Location: [facility];  Service: Cardiovascular;  Laterality: N/A;    There were no vitals filed for this visit.   Subjective Assessment - 05/17/19 0922    Subjective  He reports LT shoulder and neck pain. Relates to carrying case of  water at home . 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   Muscle relaxors help some.  uses RT armm more now at work    Diagnostic tests  None    Patient Stated Goals  He wants to decrease pain. Exercise to build muscles    Currently in Pain?  Yes    Pain Score  4     Pain Location  Neck   and LT shoulder   Pain Orientation  Left    Pain Descriptors / Indicators  Aching;Tingling    Pain Radiating Towards  NA    Pain Onset  More than a month ago    Pain Frequency  Constant    Aggravating Factors   turning LT head/neck    Pain Relieving Factors  meds    Effect of Pain on Daily Activities  limits lifting and turning at work         [facility] PT Assessment - 05/17/19 0001      Assessment   Medical Diagnosis  cervicalgia    Referring Provider (PT)  Chrisandra Netters, MD    Onset Date/Surgical Date  --   prior to 03/31/19   Next MD Visit  As needed    Prior Therapy  No      Precautions   Precautions  None      Restrictions   Weight Bearing Restrictions  No      Balance Screen  Has the patient fallen in the past 6 months  No      Prior Function   Level of Independence  Independent      Cognition   Overall Cognitive Status  Within Functional Limits for tasks assessed      Observation/Other Assessments   Focus on Therapeutic Outcomes (FOTO)   48% limited      Posture/Postural Control   Posture Comments  forward head       ROM / Strength   AROM / PROM / Strength  AROM;Strength;PROM      AROM   AROM Assessment Site  Cervical    Cervical Flexion  30    Cervical Extension  40    Cervical - Right Side Bend  24    Cervical - Left Side Bend  30    Cervical - Right Rotation  48    Cervical - Left Rotation  54      PROM   Overall PROM Comments  extension rotation and sidebend LT neck painLT RT neck and  shoulder pain.       Strength   Overall Strength Comments  Bilateral UE WNL      Palpation   Palpation comment  Tender LT paraspinals and SCM  possible rotation mid cervicals                 Objective measurements completed on examination: See above findings.      [facility] Adult PT Treatment/Exercise -  05/17/19 0001      Exercises   Exercises  Neck      Neck Exercises: Seated   Neck Retraction  5 reps    Neck Retraction Limitations  3-5 sec    Cervical Rotation  Right;Left    Cervical Rotation Limitations  2 reps       Manual Therapy   Manual Therapy  Muscle Energy Technique;Soft tissue mobilization;Joint mobilization    Joint Mobilization  CNAGS with RT rotation .              PT Education - 05/17/19 0949    Education Details  POC posture awareness and cervical ROM    Person(s) Educated  Patient    Methods  Explanation;Demonstration;Tactile cues;Verbal cues;Handout    Comprehension  Verbalized understanding;Returned demonstration       PT Short Term Goals - 05/17/19 1004      PT SHORT TERM GOAL #1   Title  He will be indpendent with initial HEP    Time  3    Period  Weeks    Status  New      PT SHORT TERM GOAL #2   Title  He will report pain as intermittant    Time  3    Period  Weeks    Status  New      PT SHORT TERM GOAL #3   Title  He will report pain generally decr 25% at sork    Time  3    Period  Weeks    Status  New        PT Long Term Goals - 05/17/19 1005      PT LONG TERM GOAL #1   Title  He will be indpendent with all hEP issued    Time  6    Period  Weeks    Status  New      PT LONG TERM GOAL #2   Title  He will report pain as intermittant and no more than  1-2/10 pain    Time  6    Period  Weeks    Status  New      PT LONG TERM GOAL #3   Title  He willbe able to turn head at work with 1-2 max pain    Time  6    Period  Weeks    Status  New      PT LONG TERM GOAL #4   Title  He will be able to use his LT arm normally with little to no pain    Time  6    Period  Weeks    Status  New             Plan - 05/17/19 0959    Clinical Impression Statement  [name] presents with 6 weeks of Lt neck and shoulder pain   that is 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 Painis asically coinstant  and low to  moderate.   He has some genral decreased ROM of neck and pain with RT rotation. in Lt neck   He probably has spasm causing most of pain but could have some joint irritation. Marland Kitchen  He should improve with skilled PT and consistent HEP.    Personal Factors and Comorbidities  Time since onset of injury/illness/exacerbation    Examination-Activity Limitations  Carry   work   Stability/Clinical Decision Making  Stable/Uncomplicated    Clinical Decision Making  Low    Rehab Potential  Good    PT Frequency  2x / week    PT Duration  6 weeks    PT Treatment/Interventions  Taping;Passive range of motion;Dry needling;Manual techniques;Patient/family education;Therapeutic exercise;Electrical Stimulation;Moist Heat;Traction;Ultrasound;Iontophoresis 4mg /ml Dexamethasone    PT Next Visit Plan  Manual for STW , modalities , review HEP, add as needed.    PT Home Exercise Plan  Cervical retraction and rotation    Consulted and Agree with Plan of Care  Patient       Patient will benefit from skilled therapeutic intervention in order to improve the following deficits and impairments:  Decreased range of motion, Obesity, Pain, Postural dysfunction, Increased muscle spasms  Visit Diagnosis: Cervicalgia - Plan: PT plan of care cert/re-cert  Cramp and spasm - Plan: PT plan of care cert/re-cert  Abnormal posture - Plan: PT plan of care cert/re-cert     Problem List Patient Active Problem List   Diagnosis Date Noted  . Body mass index 40.0-44.9, adult ([place]) 02/10/2019  . Urinary incontinence 01/02/2018  . Left groin pain 01/02/2018  . Allergic rhinitis 01/02/2018  . Healthcare maintenance 09/12/2017  . Acute nonintractable headache 02/03/2017  . Abnormal stress test   . Symptomatic sinus bradycardia   . Left sided numbness   . Muscle fasciculation   . Primary osteoarthritis of right knee 07/23/2016  . Plantar fasciitis of left foot 05/07/2016  . DJD (degenerative joint disease) of cervical spine  01/05/2016  . 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 Dizziness 03/21/2015  . Shortness of breath 03/21/2015  . OSA on CPAP   . PAF (paroxysmal atrial fibrillation) ([place])   . Palpitations   . Chest pain 03/03/2015  . Left knee pain 10/13/2014  . Atrial fibrillation ([place]) 03/14/2014  . Right knee pain 11/10/2012  . Sinusitis 10/23/2012  . Left hip pain 06/17/2011  . OSA (obstructive sleep apnea) 05/17/2011  . Chronic back pain 10/29/2010  . INSOMNIA 09/19/2008  . Gout 05/13/2007  . Hyperlipidemia  10/02/2006  . Morbid obesity ([place]) 10/02/2006  . Migraine 10/02/2006  . HYPERTENSION, BENIGN SYSTEMIC 10/02/2006  . GASTROESOPHAGEAL REFLUX, NO ESOPHAGITIS 10/02/2006    [name]  PT 05/17/2019, 10:46 AM  [facility] [address] [place], [place], [address] Phone: [phone]   Fax:  [phone]  Name: [name] MRN: [id number] Date of Birth: 1966/04/12

## 2019-05-17 NOTE — Patient Instructions (Signed)
Cervical retraction and cervical rotation RT and Lt gentle x 2-3 reps  3x/day   Heat at night

## 2019-05-19 ENCOUNTER — Encounter (HOSPITAL_COMMUNITY): Payer: Self-pay

## 2019-05-19 ENCOUNTER — Emergency Department (HOSPITAL_COMMUNITY): Payer: 59

## 2019-05-19 ENCOUNTER — Emergency Department (HOSPITAL_COMMUNITY)
Admission: EM | Admit: 2019-05-19 | Discharge: 2019-05-19 | Disposition: A | Discharge status: Home / Self Care | Payer: 59 | Attending: Emergency Medicine | Admitting: Emergency Medicine

## 2019-05-19 ENCOUNTER — Other Ambulatory Visit: Payer: Self-pay

## 2019-05-19 DIAGNOSIS — Z79899 Other long term (current) drug therapy: Secondary | ICD-10-CM | POA: Diagnosis not present

## 2019-05-19 DIAGNOSIS — Z87891 Personal history of nicotine dependence: Secondary | ICD-10-CM | POA: Diagnosis not present

## 2019-05-19 DIAGNOSIS — R1032 Left lower quadrant pain: Secondary | ICD-10-CM | POA: Diagnosis present

## 2019-05-19 LAB — CBC WITH DIFFERENTIAL/PLATELET
Abs Immature Granulocytes: 0.01 10*3/uL (ref 0.00–0.07)
Basophils Absolute: 0.1 10*3/uL (ref 0.0–0.1)
Basophils Relative: 1 %
Eosinophils Absolute: 0.3 10*3/uL (ref 0.0–0.5)
Eosinophils Relative: 4 %
HCT: 42.1 % (ref 39.0–52.0)
Hemoglobin: 13.6 g/dL (ref 13.0–17.0)
Immature Granulocytes: 0 %
Lymphocytes Relative: 35 %
Lymphs Abs: 2.2 10*3/uL (ref 0.7–4.0)
MCH: 27 pg (ref 26.0–34.0)
MCHC: 32.3 g/dL (ref 30.0–36.0)
MCV: 83.5 fL (ref 80.0–100.0)
Monocytes Absolute: 0.5 10*3/uL (ref 0.1–1.0)
Monocytes Relative: 8 %
Neutro Abs: 3.3 10*3/uL (ref 1.7–7.7)
Neutrophils Relative %: 52 %
Platelets: 194 10*3/uL (ref 150–400)
RBC: 5.04 MIL/uL (ref 4.22–5.81)
RDW: 13.2 % (ref 11.5–15.5)
WBC: 6.3 10*3/uL (ref 4.0–10.5)
nRBC: 0 % (ref 0.0–0.2)

## 2019-05-19 LAB — COMPREHENSIVE METABOLIC PANEL
ALT: 30 U/L (ref 0–44)
AST: 28 U/L (ref 15–41)
Albumin: 4.2 g/dL (ref 3.5–5.0)
Alkaline Phosphatase: 79 U/L (ref 38–126)
Anion gap: 9 (ref 5–15)
BUN: 13 mg/dL (ref 6–20)
CO2: 25 mmol/L (ref 22–32)
Calcium: 9.2 mg/dL (ref 8.9–10.3)
Chloride: 104 mmol/L (ref 98–111)
Creatinine, Ser: 0.88 mg/dL (ref 0.61–1.24)
GFR calc Af Amer: >60 mL/min (ref >60)
GFR calc non Af Amer: >60 mL/min (ref >60)
Glucose, Bld: 111 mg/dL — ABNORMAL HIGH (ref 70–99)
Potassium: 3.8 mmol/L (ref 3.5–5.1)
Sodium: 138 mmol/L (ref 135–145)
Total Bilirubin: 0.7 mg/dL (ref 0.3–1.2)
Total Protein: 7.5 g/dL (ref 6.5–8.1)

## 2019-05-19 LAB — URINALYSIS, ROUTINE W REFLEX MICROSCOPIC
Bacteria, UA: NONE SEEN
Bilirubin Urine: NEGATIVE
Glucose, UA: NEGATIVE mg/dL
Ketones, ur: NEGATIVE mg/dL
Leukocytes,Ua: NEGATIVE
Nitrite: NEGATIVE
Protein, ur: NEGATIVE mg/dL
Specific Gravity, Urine: 1.008 (ref 1.005–1.030)
pH: 6 (ref 5.0–8.0)

## 2019-05-19 LAB — LIPASE, BLOOD: Lipase: 30 U/L (ref 11–51)

## 2019-05-19 MED ORDER — IOHEXOL 300 MG/ML  SOLN
100.0000 mL | Freq: Once | INTRAMUSCULAR | Status: AC | PRN
  Administered 2019-05-19: 10:00:00 100 mL via INTRAVENOUS

## 2019-05-19 MED ORDER — SODIUM CHLORIDE (PF) 0.9 % IJ SOLN
INTRAMUSCULAR | Status: AC
  Filled 2019-05-19: qty 50

## 2019-05-19 MED ORDER — SODIUM CHLORIDE 0.9 % IV BOLUS
1000.0000 mL | Freq: Once | INTRAVENOUS | Status: AC
  Administered 2019-05-19: 1000 mL via INTRAVENOUS

## 2019-05-19 MED ORDER — MORPHINE SULFATE (PF) 4 MG/ML IV SOLN
6.0000 mg | Freq: Once | INTRAVENOUS | Status: AC
  Administered 2019-05-19: 08:00:00 6 mg via INTRAVENOUS
  Filled 2019-05-19: qty 2

## 2019-05-19 NOTE — ED Notes (Signed)
Patient given urinal, aware we need sample.

## 2019-05-19 NOTE — ED Provider Notes (Signed)
[facility] DEPT Provider Note   CSN: [id number] Arrival date & time: 05/19/19  0744     History   Chief Complaint Chief Complaint  Patient presents with  . Abdominal Pain    HPI [name] is a 53 y.o. male.     The history is provided by the patient.  Abdominal Pain Pain location:  LLQ Pain quality: aching and cramping   Pain radiates to:  Does not radiate Pain severity:  Mild Onset quality:  Gradual Duration:  1 day Timing:  Constant Progression:  Unchanged Chronicity:  New Context: suspicious food intake (maybe)   Context: not alcohol use and not previous surgeries   Relieved by:  Nothing Worsened by:  Nothing Associated symptoms: no chest pain, no chills, no cough, no dysuria, no fever, no hematuria, no shortness of breath, no sore throat and no vomiting   Risk factors: obesity   Risk factors: has not had multiple surgeries     Past Medical History:  Diagnosis Date  . Arthritis    "knees" (01/22/2017)  . Chronic lower back pain   . GERD (gastroesophageal reflux disease)   . Gout    "no flares in awhile" (01/22/2017)  . History of MRI of cervical spine 09/25/10   normal; left shoulder normal as well  . Hypertension   . Migraine    "~ q 6 months" (01/22/2017)  . Morbid obesity ([place])    "I've lost 43# in 15 weeks w/weight watchers" (01/22/2017)  . OSA on CPAP   . Paroxysmal atrial fibrillation ([place]) 7/15   chads2vasc score is 1    Patient Active Problem List   Diagnosis Date Noted  . Body mass index 40.0-44.9, adult ([place]) 02/10/2019  . Urinary incontinence 01/02/2018  . Left groin pain 01/02/2018  . Allergic rhinitis 01/02/2018  . Healthcare maintenance 09/12/2017  . Acute nonintractable headache 02/03/2017  . Abnormal stress test   . Symptomatic sinus bradycardia   . Left sided numbness   . Muscle fasciculation   . Primary osteoarthritis of right knee 07/23/2016  . Plantar fasciitis of left foot 05/07/2016  . DJD  (degenerative joint disease) of cervical spine 01/05/2016  . 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 Dizziness 03/21/2015  . Shortness of breath 03/21/2015  . OSA on CPAP   . PAF (paroxysmal atrial fibrillation) ([place])   . Palpitations   . Chest pain 03/03/2015  . Left knee pain 10/13/2014  . Atrial fibrillation ([place]) 03/14/2014  . Right knee pain 11/10/2012  . Sinusitis 10/23/2012  . Left hip pain 06/17/2011  . OSA (obstructive sleep apnea) 05/17/2011  . Chronic back pain 10/29/2010  . INSOMNIA 09/19/2008  . Gout 05/13/2007  . Hyperlipidemia 10/02/2006  . Morbid obesity ([place]) 10/02/2006  . Migraine 10/02/2006  . HYPERTENSION, BENIGN SYSTEMIC 10/02/2006  . GASTROESOPHAGEAL REFLUX, NO ESOPHAGITIS 10/02/2006    Past Surgical History:  Procedure Laterality Date  . CARDIAC CATHETERIZATION  05/2005   "no blockage"  . KNEE ARTHROSCOPY Right 02/2013  . LEFT HEART CATH AND CORONARY ANGIOGRAPHY N/A 01/27/2017   Procedure: Left Heart Cath and Coronary Angiography;  Surgeon: Troy Sine, MD;  Location: [facility];  Service: Cardiovascular;  Laterality: N/A;        Home Medications    Prior to Admission medications   Medication Sig Start Date End Date Taking? Authorizing Provider  atorvastatin (LIPITOR) 20 MG tablet Take 1 tablet by mouth once daily 05/03/19  Yes Nahser,  Wonda Cheng, MD  baclofen (LIORESAL) 10 MG tablet Take 1 tablet (10 mg total) by mouth 2 (two) times daily as needed for muscle spasms. 05/10/19  Yes [name], MD  cetirizine (ZYRTEC) 10 MG tablet Take 1 tablet (10 mg total) by mouth daily. Patient taking differently: Take 10 mg by mouth daily as needed for allergies.  09/14/18  Yes [name], MD  diltiazem (CARDIZEM) 30 MG tablet Take 1 tablet every 4 hours AS NEEDED for elevated heart rate Patient taking differently: Take 30 mg by mouth every 4 (four) hours as needed. AS NEEDED for elevated heart rate 08/21/17  Yes  Sherran Needs, NP  dofetilide (TIKOSYN) 500 MCG capsule Take 1 capsule (500 mcg total) by mouth 2 (two) times daily. 06/22/18  Yes Nahser, Wonda Cheng, MD  ELIQUIS 5 MG TABS tablet Take 1 tablet by mouth twice daily Patient taking differently: Take 5 mg by mouth 2 (two) times daily.  02/24/19  Yes Martyn Malay, MD  fluticasone (FLONASE) 50 MCG/ACT nasal spray Place 2 sprays into both nostrils daily. Patient taking differently: Place 2 sprays into both nostrils daily as needed for rhinitis.  09/14/18  Yes [name], MD  Hypromellose ([employer] GENTLE LUBRICANT EYE DROPS OP) Place 1 drop into both eyes daily as needed (dry eyes).   Yes [provider]  MITIGARE 0.6 MG CAPS TAKE 1 CAPSULE BY MOUTH EVERY OTHER DAY Patient taking differently: Take 0.6 mg by mouth every other day.  01/15/19  Yes [name], MD  Multiple Vitamin (MULTIVITAMIN WITH MINERALS) TABS tablet Take 1 tablet by mouth at bedtime.   Yes [provider]  Omega-3 Fatty Acids (FISH OIL) 500 MG CAPS Take 2,000 mg by mouth every morning.    Yes [provider]  omeprazole (PRILOSEC) 20 MG capsule Take 1 capsule by mouth twice daily 03/10/19  Yes [name], MD  TURMERIC PO Take 1 capsule by mouth every other day.    Yes [provider]    Family History Family History  Problem Relation Age of Onset  . Atrial fibrillation Mother 21       had a stroke  . Hypertension Father   . Diabetes Father     Social History Social History   Tobacco Use  . Smoking status: Former Smoker    Packs/day: 1.00    Years: 10.00    Pack years: 10.00    Types: Cigarettes    Quit date: 04/02/1994    Years since quitting: 25.1  . Smokeless tobacco: Former Systems developer    Types: Chew  . Tobacco comment: "chewed in my teens"  Substance Use Topics  . Alcohol use: No    Alcohol/week: 0.0 standard drinks    Comment: "stopped in 1994"  . Drug use: Not Currently    Types: Marijuana    Comment:  "stopped marijuna in 1994"     Allergies   Hydrochlorothiazide-propranolol [propranolol-hctz] and Penicillins   Review of Systems Review of Systems  Constitutional: Negative for chills and fever.  HENT: Negative for ear pain and sore throat.   Eyes: Negative for pain and visual disturbance.  Respiratory: Negative for cough and shortness of breath.   Cardiovascular: Negative for chest pain and palpitations.  Gastrointestinal: Positive for abdominal pain. Negative for vomiting.  Genitourinary: Negative for dysuria and hematuria.  Musculoskeletal: Negative for arthralgias and back pain.  Skin: Negative for color change and rash.  Neurological: Negative for seizures and syncope.  All other systems reviewed and are negative.    Physical Exam Updated Vital Signs  ED Triage Vitals  Enc Vitals Group     BP 05/19/19 0753 (!) 161/99     Pulse Rate 05/19/19 0753 (!) 59     Resp 05/19/19 0753 18     Temp 05/19/19 0753 97.9 F (36.6 C)     Temp Source 05/19/19 0753 Oral     SpO2 05/19/19 0752 98 %     Weight --      Height --      Head Circumference --      Peak Flow --      Pain Score --      Pain Loc --      Pain Edu? --      Excl. in [place]? --     Physical Exam Vitals signs and nursing note reviewed.  Constitutional:      General: He is not in acute distress.    Appearance: He is well-developed. He is obese. He is not ill-appearing.  HENT:     Head: Normocephalic and atraumatic.     Nose: Nose normal.     Mouth/Throat:     Mouth: Mucous membranes are moist.  Eyes:     Extraocular Movements: Extraocular movements intact.     Conjunctiva/sclera: Conjunctivae normal.     Pupils: Pupils are equal, round, and reactive to light.  Neck:     Musculoskeletal: Normal range of motion and neck supple.  Cardiovascular:     Rate and Rhythm: Normal rate and regular rhythm.     Pulses: Normal pulses.     Heart sounds: Normal heart sounds. No murmur.  Pulmonary:     Effort:  Pulmonary effort is normal. No respiratory distress.     Breath sounds: Normal breath sounds.  Abdominal:     Palpations: Abdomen is soft.     Tenderness: There is abdominal tenderness in the left upper quadrant and left lower quadrant. There is no right CVA tenderness, left CVA tenderness, guarding or rebound. Negative signs include Murphy's sign, Rovsing's sign and McBurney's sign.  Musculoskeletal: Normal range of motion.        General: No tenderness.  Skin:    General: Skin is warm and dry.     Capillary Refill: Capillary refill takes less than 2 seconds.  Neurological:     General: No focal deficit present.     Mental Status: He is alert.  Psychiatric:        Mood and Affect: Mood normal.      ED Treatments / Results  Labs (all labs ordered are listed, but only abnormal results are displayed) Labs Reviewed  COMPREHENSIVE METABOLIC PANEL - Abnormal; Notable for the following components:      Result Value   Glucose, Bld 111 (*)    All other components within normal limits  URINALYSIS, ROUTINE W REFLEX MICROSCOPIC - Abnormal; Notable for the following components:   Color, Urine STRAW (*)    Hgb urine dipstick SMALL (*)    All other components within normal limits  CBC WITH DIFFERENTIAL/PLATELET  LIPASE, BLOOD    EKG None  Radiology Ct Abdomen Pelvis W Contrast  Result Date: 05/19/2019 CLINICAL DATA:  Left lower abdominal pain EXAM: CT ABDOMEN AND PELVIS WITH CONTRAST TECHNIQUE: Multidetector CT imaging of the abdomen and pelvis was performed using the standard protocol following bolus administration of intravenous contrast. CONTRAST:  151mL OMNIPAQUE IOHEXOL 300 MG/ML  SOLN COMPARISON:  01/14/2018  FINDINGS: Lower chest: Lung bases are clear. No effusions. Heart is normal size. Hepatobiliary: Diffuse low-density throughout the liver compatible with fatty infiltration. No focal abnormality. Gallbladder unremarkable. Pancreas: No focal abnormality or ductal dilatation.  Spleen: No focal abnormality.  Normal size. Adrenals/Urinary Tract: Small cysts within the right kidney, stable. No suspicious renal or adrenal lesion. No hydronephrosis. Urinary bladder unremarkable. Stomach/Bowel: Normal appendix. Stomach, large and small bowel grossly unremarkable. Vascular/Lymphatic: No evidence of aneurysm or adenopathy. Reproductive: No visible focal abnormality. Other: No free fluid or free air. Musculoskeletal: No acute bony abnormality. IMPRESSION: Diffuse fatty infiltration of the liver. No acute findings in the abdomen or pelvis. Electronically Signed   By: Rolm Baptise M.D.   On: 05/19/2019 10:41    Procedures Procedures (including critical care time)  Medications Ordered in ED Medications  sodium chloride (PF) 0.9 % injection (has no administration in time range)  sodium chloride 0.9 % bolus 1,000 mL (1,000 mLs Intravenous New Bag/Given 05/19/19 0816)  morphine 4 MG/ML injection 6 mg (6 mg Intravenous Given 05/19/19 0814)  iohexol (OMNIPAQUE) 300 MG/ML solution 100 mL (100 mLs Intravenous Contrast Given 05/19/19 1002)     Initial Impression / Assessment and Plan / ED Course  I have reviewed the triage vital signs and the nursing notes.  Pertinent labs & imaging results that were available during my care of the patient were reviewed by me and considered in my medical decision making (see chart for details).     [name] is a 53 year old male with history of reflux, hypertension, atrial fibrillation on blood thinner who presents to the ED with abdominal pain.  Patient with unremarkable vitals.  No fever.  Left lower quadrant abdominal pain since last night.  Denies any nausea, diarrhea.  Possibly suspicious food intake.  No history of diverticulitis, kidney stones.  Denies any urinary symptoms.  Patient has mostly left-sided abdominal pain on exam.  No flank pain.  Overall appears comfortable.  Possibly UTI versus diverticulitis versus MSK pain.  Less likely bowel  obstruction.  Will get lab work including CT scan of abdomen and pelvis.  Will give fluid bolus, IV fentanyl and reevaluate.  CT abdomen pelvis showed no acute findings.  Urinalysis negative.  No significant leukocytosis, electrolyte abnormality, kidney injury.  Gallbladder and liver enzymes within normal limits.  Possibly foodborne related illness or musculoskeletal type pain.  Given reassurance and discharged in ED in good condition.  Recommend follow-up with primary care doctor.  Told to return to the ED symptoms worsen.  This chart was dictated using voice recognition software.  Despite best efforts to proofread,  errors can occur which can change the documentation meaning.    Final Clinical Impressions(s) / ED Diagnoses   Final diagnoses:  Left lower quadrant abdominal pain    ED Discharge Orders    None       Lennice Sites, DO 05/19/19 1107

## 2019-05-19 NOTE — ED Triage Notes (Signed)
Per EMS Pt from work with c/o of L lower abdominal pain which began at 7 pm last night intermittently.  When pt got to work pain got progressively worse.  Pt c/o of  Increased urination.  Pt c/o of mild urinary retention.

## 2019-05-19 NOTE — ED Notes (Signed)
Patient transported to CT 

## 2019-05-26 ENCOUNTER — Ambulatory Visit: Payer: 59 | Admitting: Physical Therapy

## 2019-05-31 ENCOUNTER — Other Ambulatory Visit: Payer: Self-pay | Admitting: Family Medicine

## 2019-05-31 ENCOUNTER — Other Ambulatory Visit (HOSPITAL_COMMUNITY): Payer: Self-pay | Admitting: *Deleted

## 2019-05-31 ENCOUNTER — Ambulatory Visit: Payer: 59

## 2019-05-31 ENCOUNTER — Other Ambulatory Visit: Payer: Self-pay

## 2019-05-31 DIAGNOSIS — R252 Cramp and spasm: Secondary | ICD-10-CM

## 2019-05-31 DIAGNOSIS — R293 Abnormal posture: Secondary | ICD-10-CM

## 2019-05-31 DIAGNOSIS — M542 Cervicalgia: Secondary | ICD-10-CM | POA: Diagnosis not present

## 2019-05-31 MED ORDER — APIXABAN 5 MG PO TABS: 5.0000 mg | ORAL_TABLET | Freq: Two times a day (BID) | ORAL | 1 refills | Status: DC

## 2019-05-31 NOTE — Therapy (Signed)
[facility] [place], [place], [address] Phone: [phone]   Fax:  [phone]  Physical Therapy Treatment  Patient Details  Name: [name] MRN: [id number] Date of Birth: 08-03-1966 Referring Provider (PT): Chrisandra Netters, MD   Encounter Date: 05/31/2019  PT End of Session - 05/31/19 0846    Visit Number  2    Number of Visits  12    Date for PT Re-Evaluation  06/25/19    Authorization Type  [employer]    PT Start Time  0845    PT Stop Time  0925    PT Time Calculation (min)  40 min    Activity Tolerance  Patient tolerated treatment well    Behavior During Therapy  [facility] for tasks assessed/performed       Past Medical History:  Diagnosis Date  . Arthritis    "knees" (01/22/2017)  . Chronic lower back pain   . GERD (gastroesophageal reflux disease)   . Gout    "no flares in awhile" (01/22/2017)  . History of MRI of cervical spine 09/25/10   normal; left shoulder normal as well  . Hypertension   . Migraine    "~ q 6 months" (01/22/2017)  . Morbid obesity ([place])    "I've lost 43# in 15 weeks w/weight watchers" (01/22/2017)  . OSA on CPAP   . Paroxysmal atrial fibrillation ([place]) 7/15   chads2vasc score is 1    Past Surgical History:  Procedure Laterality Date  . CARDIAC CATHETERIZATION  05/2005   "no blockage"  . KNEE ARTHROSCOPY Right 02/2013  . LEFT HEART CATH AND CORONARY ANGIOGRAPHY N/A 01/27/2017   Procedure: Left Heart Cath and Coronary Angiography;  Surgeon: Troy Sine, MD;  Location: [facility];  Service: Cardiovascular;  Laterality: N/A;    There were no vitals filed for this visit.  Subjective Assessment - 05/31/19 0845    Subjective  He reports pain is some better.    Pain Score  2     Pain Location  Neck    Pain Orientation  Left    Pain Descriptors / Indicators  Throbbing    Pain Onset  More than a month ago    Pain Frequency  Constant    Aggravating Factors   turning head at  work    Pain Relieving Factors  meds                       [facility] Adult PT Treatment/Exercise - 05/31/19 0001      Modalities   Modalities  Moist Heat;Ultrasound      Moist Heat Therapy   Number Minutes Moist Heat  12 Minutes    Moist Heat Location  Cervical      Ultrasound   Ultrasound Location  LT neck    Ultrasound Parameters  100% 1.6Wcm2 1MHz    Ultrasound Goals  Pain      Manual Therapy   Manual Therapy  Soft tissue mobilization;Passive ROM    Joint Mobilization  PA mobs LT cervical spoine. Gr 2-3    Soft tissue mobilization  LT neck and traps               PT Short Term Goals - 05/17/19 1004      PT SHORT TERM GOAL #1   Title  He will be indpendent with initial HEP    Time  3    Period  Weeks    Status  New      PT SHORT TERM GOAL #2   Title  He will report pain as intermittant    Time  3    Period  Weeks    Status  New      PT SHORT TERM GOAL #3   Title  He will report pain generally decr 25% at [employer]    Time  3    Period  Weeks    Status  New        PT Long Term Goals - 05/17/19 1005      PT LONG TERM GOAL #1   Title  He will be indpendent with all hEP issued    Time  6    Period  Weeks    Status  New      PT LONG TERM GOAL #2   Title  He will report pain as intermittant and no more than 1-2/10 pain    Time  6    Period  Weeks    Status  New      PT LONG TERM GOAL #3   Title  He willbe able to turn head at work with 1-2 max pain    Time  6    Period  Weeks    Status  New      PT LONG TERM GOAL #4   Title  He will be able to use his LT arm normally with little to no pain    Time  6    Period  Weeks    Status  New            Plan - 05/31/19 0846    Clinical Impression Statement  Doing better  but still tender and incr pain with work.    PT Treatment/Interventions  Taping;Passive range of motion;Dry needling;Manual techniques;Patient/family education;Therapeutic exercise;Electrical Stimulation;Moist  Heat;Traction;Ultrasound;Iontophoresis 4mg /ml Dexamethasone    PT Next Visit Plan  Manual for STW , modalities    PT Home Exercise Plan  Cervical retraction and rotation    Consulted and Agree with Plan of Care  Patient       Patient will benefit from skilled therapeutic intervention in order to improve the following deficits and impairments:  Decreased range of motion, Obesity, Pain, Postural dysfunction, Increased muscle spasms  Visit Diagnosis: Cervicalgia  Cramp and spasm  Abnormal posture     Problem List Patient Active Problem List   Diagnosis Date Noted  . Body mass index 40.0-44.9, adult ([place]) 02/10/2019  . Urinary incontinence 01/02/2018  . Left groin pain 01/02/2018  . Allergic rhinitis 01/02/2018  . Healthcare maintenance 09/12/2017  . Acute nonintractable headache 02/03/2017  . Abnormal stress test   . Symptomatic sinus bradycardia   . Left sided numbness   . Muscle fasciculation   . Primary osteoarthritis of right knee 07/23/2016  . Plantar fasciitis of left foot 05/07/2016  . 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 Dizziness 03/21/2015  . Shortness of breath 03/21/2015  . OSA on CPAP   . PAF (paroxysmal atrial fibrillation) ([place])   . Palpitations   . Chest pain 03/03/2015  . Left knee pain 10/13/2014  . Atrial fibrillation ([place]) 03/14/2014  . Right knee pain 11/10/2012  . Sinusitis 10/23/2012  . Left hip pain 06/17/2011  . OSA (obstructive sleep apnea) 05/17/2011  . Chronic back pain 10/29/2010  . INSOMNIA 09/19/2008  . Gout 05/13/2007  . Hyperlipidemia 10/02/2006  .  Morbid obesity ([place]) 10/02/2006  . Migraine 10/02/2006  . HYPERTENSION, BENIGN SYSTEMIC 10/02/2006  . GASTROESOPHAGEAL REFLUX, NO ESOPHAGITIS 10/02/2006    [name]  PT 05/31/2019, 9:18 AM  [facility] [address] [place],  [place], [address] Phone: [phone]   Fax:  [phone]  Name: [name] MRN: [id number] Date of Birth: 1966-06-02

## 2019-06-02 ENCOUNTER — Ambulatory Visit: Payer: 59 | Admitting: Physical Therapy

## 2019-06-02 ENCOUNTER — Telehealth: Payer: Self-pay | Admitting: Physical Therapy

## 2019-06-02 NOTE — Telephone Encounter (Signed)
Pt failed to show up for his Physical Therapy appointment today at 4:15pm. A message was left for pt to reschedule his appointment or cancel any further appointments if needed. Our office number was left on voicemail [phone]).   Kearney Hard, PT 06/02/19 4:50 PM

## 2019-06-07 ENCOUNTER — Ambulatory Visit: Payer: 59 | Attending: Family Medicine

## 2019-06-07 ENCOUNTER — Other Ambulatory Visit: Payer: Self-pay

## 2019-06-07 DIAGNOSIS — M542 Cervicalgia: Secondary | ICD-10-CM | POA: Diagnosis present

## 2019-06-07 DIAGNOSIS — R293 Abnormal posture: Secondary | ICD-10-CM | POA: Diagnosis present

## 2019-06-07 DIAGNOSIS — R252 Cramp and spasm: Secondary | ICD-10-CM | POA: Diagnosis not present

## 2019-06-07 NOTE — Patient Instructions (Signed)
Strengthening: Resisted Horizontal Abduction    Hold tubing in right hand, elbow straight, arm in, parallel to floor. Pull arm out from side through pain-free range. Repeat 15(Home) External Rotation: With Abduction - Sitting    Opposite side toward anchor, right arm supported, elbow out, bent to 90, forearm across body. Rotate shoulder by pulling hand away from body. Keep elbow bent to 90. Repeat _15___ times per set. Do __1-2__ sets per session. Do __7__ sessions per week. Use ____ lb weights.  Copyright  VHI. All rights reserved.  ____ times per set. Do __1-2_ sets per session. Do __1__ sessions per day.   Use both arms  http://orth.exer.us/836   Copyright  VHI. All rights reserved.

## 2019-06-07 NOTE — Therapy (Signed)
[place], [place], [address] Phone: [phone]   Fax:  [phone]  Physical Therapy Treatment  Patient Details  Name: [name] MRN: [id number] Date of Birth: [birth year] Referring Provider (PT): Chrisandra Netters, MD   Encounter Date: 06/07/2019  PT End of Session - 06/07/19 0844    Visit Number  3    Number of Visits  12    Date for PT Re-Evaluation  06/25/19    Authorization Type  UHC    PT Start Time  0840   late   PT Stop Time  0925    PT Time Calculation (min)  45 min    Behavior During Therapy  [facility] for tasks assessed/performed       Past Medical History:  Diagnosis Date  . Arthritis    "knees" (01/22/2017)  . Chronic lower back pain   . GERD (gastroesophageal reflux disease)   . Gout    "no flares in awhile" (01/22/2017)  . History of MRI of cervical spine 09/25/10   normal; left shoulder normal as well  . Hypertension   . Migraine    "~ q 6 months" (01/22/2017)  . Morbid obesity ([place])    "I've lost 43# in 15 weeks w/weight watchers" (01/22/2017)  . OSA on CPAP   . Paroxysmal atrial fibrillation ([place]) 7/15   chads2vasc score is 1    Past Surgical History:  Procedure Laterality Date  . CARDIAC CATHETERIZATION  05/2005   "no blockage"  . KNEE ARTHROSCOPY Right 02/2013  . LEFT HEART CATH AND CORONARY ANGIOGRAPHY N/A 01/27/2017   Procedure: Left Heart Cath and Coronary Angiography;  Surgeon: Troy Sine, MD;  Location: [facility];  Service: Cardiovascular;  Laterality: N/A;    There were no vitals filed for this visit.  Subjective Assessment - 06/07/19 0841    Subjective  Still sore  with turning.    Pain Score  2     Pain Location  Neck    Pain Orientation  Left    Pain Descriptors / Indicators  Throbbing    Pain Onset  More than a month ago    Pain Frequency  Constant    Aggravating Factors   turn head a twork         [facility] PT Assessment - 06/07/19 0001       AROM   Cervical Flexion  33    Cervical Extension  60    Cervical - Right Side Bend  35    Cervical - Left Side Bend  35    Cervical - Right Rotation  63    Cervical - Left Rotation  54                   [facility] Adult PT Treatment/Exercise - 06/07/19 0001      Neck Exercises: Seated   Other Seated Exercise  ER and hor abduct  green band x 15        Cervical rotation , side bend and retraction x5 , gentle overpressure      PT Education - 06/07/19 0919    Education Details  HEP    Person(s) Educated  Patient    Methods  Explanation;Demonstration;Verbal cues;Handout    Comprehension  Returned demonstration;Verbalized understanding       PT Short Term Goals - 06/07/19 0905      PT SHORT TERM GOAL #1   Title  He will be indpendent with initial HEP  Status  On-going      PT SHORT TERM GOAL #2   Title  He will report pain as intermittant    Baseline  better but still constant    Status  On-going      PT SHORT TERM GOAL #3   Title  He will report pain generally decr 25% at sork    Status  Achieved        PT Long Term Goals - 05/17/19 1005      PT LONG TERM GOAL #1   Title  He will be indpendent with all hEP issued    Time  6    Period  Weeks    Status  New      PT LONG TERM GOAL #2   Title  He will report pain as intermittant and no more than 1-2/10 pain    Time  6    Period  Weeks    Status  New      PT LONG TERM GOAL #3   Title  He willbe able to turn head at work with 1-2 max pain    Time  6    Period  Weeks    Status  New      PT LONG TERM GOAL #4   Title  He will be able to use his LT arm normally with little to no pain    Time  6    Period  Weeks    Status  New            Plan - 06/07/19 0845    Clinical Impression Statement  AROM improved  Pain improving but still constant. Work still  incr pain    PT Treatment/Interventions  Taping;Passive range of motion;Dry needling;Manual techniques;Patient/family education;Therapeutic  exercise;Electrical Stimulation;Moist Heat;Traction;Ultrasound;Iontophoresis 4mg /ml Dexamethasone    PT Next Visit Plan  Manual for STW , modalities    PT Home Exercise Plan  Cervical retraction and rotation    Consulted and Agree with Plan of Care  Patient       Patient will benefit from skilled therapeutic intervention in order to improve the following deficits and impairments:  Decreased range of motion, Obesity, Pain, Postural dysfunction, Increased muscle spasms  Visit Diagnosis: Cramp and spasm  Abnormal posture  Cervicalgia     Problem List Patient Active Problem List   Diagnosis Date Noted  . Body mass index 40.0-44.9, adult ([place]) 02/10/2019  . Urinary incontinence 01/02/2018  . Left groin pain 01/02/2018  . Allergic rhinitis 01/02/2018  . Healthcare maintenance 09/12/2017  . Acute nonintractable headache 02/03/2017  . Abnormal stress test   . Symptomatic sinus bradycardia   . Left sided numbness   . Muscle fasciculation   . Primary osteoarthritis of right knee 07/23/2016  . Plantar fasciitis of left foot 05/07/2016  . 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 Dizziness 03/21/2015  . Shortness of breath 03/21/2015  . OSA on CPAP   . PAF (paroxysmal atrial fibrillation) ([place])   . Palpitations   . Chest pain 03/03/2015  . Left knee pain 10/13/2014  . Atrial fibrillation ([place]) 03/14/2014  . Right knee pain 11/10/2012  . Sinusitis 10/23/2012  . Left hip pain 06/17/2011  . OSA (obstructive sleep apnea) 05/17/2011  . Chronic back pain 10/29/2010  . INSOMNIA 09/19/2008  . Gout 05/13/2007  . Hyperlipidemia 10/02/2006  . Morbid obesity ([place]) 10/02/2006  . Migraine 10/02/2006  .  HYPERTENSION, BENIGN SYSTEMIC 10/02/2006  . GASTROESOPHAGEAL REFLUX, NO ESOPHAGITIS 10/02/2006    [name]   PT 06/07/2019, 9:20 AM  [facility] [address] [place], [place], [address] Phone: [phone]   Fax:  [phone]  Name: [name] MRN: [id number] Date of Birth: May 02, 1966

## 2019-06-10 ENCOUNTER — Ambulatory Visit: Payer: 59

## 2019-06-10 ENCOUNTER — Other Ambulatory Visit: Payer: Self-pay

## 2019-06-10 DIAGNOSIS — M542 Cervicalgia: Secondary | ICD-10-CM

## 2019-06-10 DIAGNOSIS — R252 Cramp and spasm: Secondary | ICD-10-CM

## 2019-06-10 DIAGNOSIS — R293 Abnormal posture: Secondary | ICD-10-CM

## 2019-06-10 NOTE — Therapy (Signed)
[place], [place], [address] Phone: [phone]   Fax:  [phone]  Physical Therapy Treatment  Patient Details  Name: [name] MRN: [id number] Date of Birth: 23-Jul-1966 Referring Provider (PT): Chrisandra Netters, MD   Encounter Date: 06/10/2019  PT End of Session - 06/10/19 1622    Visit Number  4    Number of Visits  12    Date for PT Re-Evaluation  06/25/19    Authorization Type  UHC    PT Start Time  0414    PT Stop Time  0454    PT Time Calculation (min)  40 min    Activity Tolerance  Patient tolerated treatment well    Behavior During Therapy  [facility] for tasks assessed/performed       Past Medical History:  Diagnosis Date  . Arthritis    "knees" (01/22/2017)  . Chronic lower back pain   . GERD (gastroesophageal reflux disease)   . Gout    "no flares in awhile" (01/22/2017)  . History of MRI of cervical spine 09/25/10   normal; left shoulder normal as well  . Hypertension   . Migraine    "~ q 6 months" (01/22/2017)  . Morbid obesity ([place])    "I've lost 43# in 15 weeks w/weight watchers" (01/22/2017)  . OSA on CPAP   . Paroxysmal atrial fibrillation ([place]) 7/15   chads2vasc score is 1    Past Surgical History:  Procedure Laterality Date  . CARDIAC CATHETERIZATION  05/2005   "no blockage"  . KNEE ARTHROSCOPY Right 02/2013  . LEFT HEART CATH AND CORONARY ANGIOGRAPHY N/A 01/27/2017   Procedure: Left Heart Cath and Coronary Angiography;  Surgeon: Troy Sine, MD;  Location: [facility];  Service: Cardiovascular;  Laterality: N/A;    There were no vitals filed for this visit.  Subjective Assessment - 06/10/19 1623    Subjective  Neck is getting better    Pain Score  1     Pain Location  Neck    Pain Orientation  Left    Pain Descriptors / Indicators  Nagging    Pain Onset  More than a month ago    Pain Frequency  Intermittent    Aggravating Factors   turn head a work                        [employer] Adult PT Treatment/Exercise - 06/10/19 0001      Neck Exercises: Seated   Other Seated Exercise  ER and hor abduct  green band x 15       Moist Heat Therapy   Number Minutes Moist Heat  --    Moist Heat Location  --      Ultrasound   Ultrasound Location  Lt neck    Ultrasound Parameters  100% !Mhz  1/7/Wcm2    Ultrasound Goals  Pain      Manual Therapy   Joint Mobilization  PA mobs LT cervical spoine. Gr 3-4    Soft tissue mobilization  LT neck and traps               PT Short Term Goals - 06/10/19 1706      PT SHORT TERM GOAL #1   Title  He will be indpendent with initial HEP    Status  Achieved      PT SHORT TERM GOAL #2   Title  He will report pain  as intermittant    Status  Achieved      PT SHORT TERM GOAL #3   Title  He will report pain generally decr 25% at sork    Status  Achieved        PT Long Term Goals - 05/17/19 1005      PT LONG TERM GOAL #1   Title  He will be indpendent with all hEP issued    Time  6    Period  Weeks    Status  New      PT LONG TERM GOAL #2   Title  He will report pain as intermittant and no more than 1-2/10 pain    Time  6    Period  Weeks    Status  New      PT LONG TERM GOAL #3   Title  He willbe able to turn head at work with 1-2 max pain    Time  6    Period  Weeks    Status  New      PT LONG TERM GOAL #4   Title  He will be able to use his LT arm normally with little to no pain    Time  6    Period  Weeks    Status  New            Plan - 06/10/19 1622    Clinical Impression Statement  Improveing as his pain was only 1/10 after work today. He reports increased ROM at work.  Declined heat post  session.  Measure ROM next session    PT Treatment/Interventions  Taping;Passive range of motion;Dry needling;Manual techniques;Patient/family education;Therapeutic exercise;Electrical Stimulation;Moist Heat;Traction;Ultrasound;Iontophoresis 4mg /ml Dexamethasone    PT  Next Visit Plan  Manual for STW , modalities                Measure . Add thoracic rotation. HEP    PT Home Exercise Plan  Cervical retraction and rotation, band ER and hor abduciton  with neck stability    Consulted and Agree with Plan of Care  Patient       Patient will benefit from skilled therapeutic intervention in order to improve the following deficits and impairments:  Decreased range of motion, Obesity, Pain, Postural dysfunction, Increased muscle spasms  Visit Diagnosis: Abnormal posture  Cramp and spasm  Cervicalgia     Problem List Patient Active Problem List   Diagnosis Date Noted  . Body mass index 40.0-44.9, adult ([place]) 02/10/2019  . Urinary incontinence 01/02/2018  . Left groin pain 01/02/2018  . Allergic rhinitis 01/02/2018  . Healthcare maintenance 09/12/2017  . Acute nonintractable headache 02/03/2017  . Abnormal stress test   . Symptomatic sinus bradycardia   . Left sided numbness   . Muscle fasciculation   . Primary osteoarthritis of right knee 07/23/2016  . Plantar fasciitis of left foot 05/07/2016  . 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 Dizziness 03/21/2015  . Shortness of breath 03/21/2015  . OSA on CPAP   . PAF (paroxysmal atrial fibrillation) ([place])   . Palpitations   . Chest pain 03/03/2015  . Left knee pain 10/13/2014  . Atrial fibrillation ([place]) 03/14/2014  . Right knee pain 11/10/2012  . Sinusitis 10/23/2012  . Left hip pain 06/17/2011  . OSA (obstructive sleep apnea) 05/17/2011  . Chronic back pain 10/29/2010  . INSOMNIA 09/19/2008  . Gout 05/13/2007  .  Hyperlipidemia 10/02/2006  . Morbid obesity ([place]) 10/02/2006  . Migraine 10/02/2006  . HYPERTENSION, BENIGN SYSTEMIC 10/02/2006  . GASTROESOPHAGEAL REFLUX, NO ESOPHAGITIS 10/02/2006    [name]  PT 06/10/2019, 5:07 PM  [facility] [address] [place], [place], [address] Phone: [phone]   Fax:  [phone]  Name: Gershom Venier MRN: [id number] Date of Birth: 12-Jul-1966

## 2019-06-14 ENCOUNTER — Other Ambulatory Visit: Payer: Self-pay

## 2019-06-14 ENCOUNTER — Ambulatory Visit: Payer: 59

## 2019-06-14 DIAGNOSIS — M542 Cervicalgia: Secondary | ICD-10-CM

## 2019-06-14 DIAGNOSIS — R252 Cramp and spasm: Secondary | ICD-10-CM | POA: Diagnosis not present

## 2019-06-14 NOTE — Therapy (Signed)
[place], [place], [address] Phone: [phone]   Fax:  [phone]  Physical Therapy Treatment  Patient Details  Name: [name] MRN: [id number] Date of Birth: 1966/02/04 Referring Provider (PT): Chrisandra Netters, MD   Encounter Date: 06/14/2019  PT End of Session - 06/14/19 0843    Visit Number  5    Number of Visits  12    Date for PT Re-Evaluation  06/25/19    Authorization Type  UHC    PT Start Time  [phone]   late   PT Stop Time  0912    PT Time Calculation (min)  29 min    Activity Tolerance  Patient tolerated treatment well    Behavior During Therapy  [facility] for tasks assessed/performed       Past Medical History:  Diagnosis Date  . Arthritis    "knees" (01/22/2017)  . Chronic lower back pain   . GERD (gastroesophageal reflux disease)   . Gout    "no flares in awhile" (01/22/2017)  . History of MRI of cervical spine 09/25/10   normal; left shoulder normal as well  . Hypertension   . Migraine    "~ q 6 months" (01/22/2017)  . Morbid obesity ([place])    "I've lost 43# in 15 weeks w/weight watchers" (01/22/2017)  . OSA on CPAP   . Paroxysmal atrial fibrillation ([place]) 7/15   chads2vasc score is 1    Past Surgical History:  Procedure Laterality Date  . CARDIAC CATHETERIZATION  05/2005   "no blockage"  . KNEE ARTHROSCOPY Right 02/2013  . LEFT HEART CATH AND CORONARY ANGIOGRAPHY N/A 01/27/2017   Procedure: Left Heart Cath and Coronary Angiography;  Surgeon: Troy Sine, MD;  Location: [facility];  Service: Cardiovascular;  Laterality: N/A;    There were no vitals filed for this visit.  Subjective Assessment - 06/14/19 0845    Subjective  No pain today.    Currently in Pain?  No/denies         [facility] PT Assessment - 06/14/19 0001      AROM   Cervical - Right Rotation  71    Cervical - Left Rotation  65                   [facility] Adult PT Treatment/Exercise -  06/14/19 0001      Ultrasound   Ultrasound Location  LT neck     Ultrasound Parameters  100%  1MHz 1.6 Wcm2    Ultrasound Goals  Pain      Manual Therapy   Manual therapy comments  SNags and PROM LT rotation    Joint Mobilization  PA mobs LT cervical spoine. Gr 3-4    Soft tissue mobilization  LT neck and traps               PT Short Term Goals - 06/10/19 1706      PT SHORT TERM GOAL #1   Title  He will be indpendent with initial HEP    Status  Achieved      PT SHORT TERM GOAL #2   Title  He will report pain as intermittant    Status  Achieved      PT SHORT TERM GOAL #3   Title  He will report pain generally decr 25% at [facility]    Status  Achieved        PT Long Term Goals - 05/17/19 1005  PT LONG TERM GOAL #1   Title  He will be indpendent with all hEP issued    Time  6    Period  Weeks    Status  New      PT LONG TERM GOAL #2   Title  He will report pain as intermittant and no more than 1-2/10 pain    Time  6    Period  Weeks    Status  New      PT LONG TERM GOAL #3   Title  He willbe able to turn head at work with 1-2 max pain    Time  6    Period  Weeks    Status  New      PT LONG TERM GOAL #4   Title  He will be able to use his LT arm normally with little to no pain    Time  6    Period  Weeks    Status  New            Plan - 06/14/19 0844    Clinical Impression Statement  Continues with Decreased LT rotation ROM 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    PT Treatment/Interventions  Taping;Passive range of motion;Dry needling;Manual techniques;Patient/family education;Therapeutic exercise;Electrical Stimulation;Moist Heat;Traction;Ultrasound;Iontophoresis 4mg /ml Dexamethasone    PT Next Visit Plan  Manual for STW , modalities              . Add thoracic rotation. HEP    PT Home Exercise Plan  Cervical retraction and rotation, band ER and hor abduciton  with neck stability    Consulted and  Agree with Plan of Care  Patient       Patient will benefit from skilled therapeutic intervention in order to improve the following deficits and impairments:  Decreased range of motion, Obesity, Pain, Postural dysfunction, Increased muscle spasms  Visit Diagnosis: Cramp and spasm  Cervicalgia     Problem List Patient Active Problem List   Diagnosis Date Noted  . Body mass index 40.0-44.9, adult ([place]) 02/10/2019  . Urinary incontinence 01/02/2018  . Left groin pain 01/02/2018  . Allergic rhinitis 01/02/2018  . Healthcare maintenance 09/12/2017  . Acute nonintractable headache 02/03/2017  . Abnormal stress test   . Symptomatic sinus bradycardia   . Left sided numbness   . Muscle fasciculation   . Primary osteoarthritis of right knee 07/23/2016  . Plantar fasciitis of left foot 05/07/2016  . 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 Dizziness 03/21/2015  . Shortness of breath 03/21/2015  . OSA on CPAP   . PAF (paroxysmal atrial fibrillation) ([place])   . Palpitations   . Chest pain 03/03/2015  . Left knee pain 10/13/2014  . Atrial fibrillation ([place]) 03/14/2014  . Right knee pain 11/10/2012  . Sinusitis 10/23/2012  . Left hip pain 06/17/2011  . OSA (obstructive sleep apnea) 05/17/2011  . Chronic back pain 10/29/2010  . INSOMNIA 09/19/2008  . Gout 05/13/2007  . Hyperlipidemia 10/02/2006  . Morbid obesity ([place]) 10/02/2006  . Migraine 10/02/2006  . HYPERTENSION, BENIGN SYSTEMIC 10/02/2006  . GASTROESOPHAGEAL REFLUX, NO ESOPHAGITIS 10/02/2006    [name]  PT 06/14/2019, 9:19 AM  [facility] [address] [place], [place], [address] Phone: [phone]   Fax:  [phone]  Name: [name] MRN: [id number] Date of Birth:  04/27/1966   

## 2019-06-17 ENCOUNTER — Other Ambulatory Visit: Payer: Self-pay

## 2019-06-17 ENCOUNTER — Ambulatory Visit: Payer: 59

## 2019-06-17 DIAGNOSIS — M542 Cervicalgia: Secondary | ICD-10-CM

## 2019-06-17 DIAGNOSIS — R293 Abnormal posture: Secondary | ICD-10-CM

## 2019-06-17 DIAGNOSIS — R252 Cramp and spasm: Secondary | ICD-10-CM

## 2019-06-17 NOTE — Therapy (Signed)
[facility] [address] [place], [place], [address] Phone: [phone]   Fax:  [phone]  Physical Therapy Treatment  Patient Details  Name: [name] MRN: [id number] Date of Birth: 12/02/1965 Referring Provider (PT): Brittany McIntyre, MD   Encounter Date: 06/17/2019  PT End of Session - 06/17/19 1618    Visit Number  6    Number of Visits  12    Date for PT Re-Evaluation  06/25/19    Authorization Type  UHC    PT Start Time  0418    PT Stop Time  0456    PT Time Calculation (min)  38 min    Activity Tolerance  Patient tolerated treatment well    Behavior During Therapy  [facility] for tasks assessed/performed       Past Medical History:  Diagnosis Date  . Arthritis    "knees" (01/22/2017)  . Chronic lower back pain   . GERD (gastroesophageal reflux disease)   . Gout    "no flares in awhile" (01/22/2017)  . History of MRI of cervical spine 09/25/10   normal; left shoulder normal as well  . Hypertension   . Migraine    "~ q 6 months" (01/22/2017)  . Morbid obesity ([facility])    "I've lost 43# in 15 weeks w/weight watchers" (01/22/2017)  . OSA on CPAP   . Paroxysmal atrial fibrillation ([facility]) 7/15   chads2vasc score is 1    Past Surgical History:  Procedure Laterality Date  . CARDIAC CATHETERIZATION  05/2005   "no blockage"  . KNEE ARTHROSCOPY Right 02/2013  . LEFT HEART CATH AND CORONARY ANGIOGRAPHY N/A 01/27/2017   Procedure: Left Heart Cath and Coronary Angiography;  Surgeon: Kelly, Thomas A, MD;  [facility]: [facility];  Service: Cardiovascular;  Laterality: N/A;    There were no vitals filed for this visit.  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    Currently in Pain?  No/denies                       [facility] Adult PT Treatment/Exercise - 06/17/19 0001      Neck Exercises: Seated   Other Seated Exercise  Thoracic rotation  stretching with overpressure RT and LT  And self SNAGS RT and LT neck rotation     Ultrasound   Ultrasound Location  LT neck    Ultrasound Parameters  !00% !MHZ 1.7 Wcm2    Ultrasound Goals  Pain      Manual Therapy   Manual therapy comments  SNags and PROM LT and RT rotation    Joint Mobilization  PA mobs LT cervical spoine. Gr 3-4    Soft tissue mobilization  LT neck and traps             PT Education - 06/17/19 1713    Education Details  Thoracic rotaiton    Person(s) Educated  Patient    Methods  Explanation;Demonstration;Tactile cues;Verbal cues;Handout    Comprehension  Returned demonstration;Verbalized understanding       PT Short Term Goals - 06/10/19 1706      PT SHORT TERM GOAL #1   Title  He will be indpendent with initial HEP    Status  Achieved      PT SHORT TERM GOAL #2   Title  He will report pain as intermittant    Status    Achieved      PT SHORT TERM GOAL #3   Title  He will report pain generally decr 25% at [facility]ork    Status  Achieved        PT Long Term Goals - 06/17/19 1714      PT LONG TERM GOAL #1   Title  He will be indpendent with all hEP issued    Baseline  progressing    Status  On-going      PT LONG TERM GOAL #2   Title  He will report pain as intermittant and no more than 1-2/10 pain    Baseline  intermittant but can be 3-4/10 at times    Status  Partially Met      PT LONG TERM GOAL #3   Title  He willbe able to turn head at work with 1-2 max pain    Baseline  occasionally 3-4    Status  On-going      PT LONG TERM GOAL #4   Title  He will be able to use his LT arm normally with little to no pain    Baseline  no reported issues    Status  Achieved            Plan - 06/17/19 1619    Clinical Impression Statement  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    PT Home Exercise Plan  Cervical retraction and rotation, band ER and hor abduciton  with neck stability,  thoracici rotation    Consulted and Agree with Plan of Care  Patient       Patient will benefit from skilled therapeutic intervention in order to improve the following deficits and impairments:  Decreased range of motion, Obesity, Pain, Postural dysfunction, Increased muscle spasms  Visit Diagnosis: Cramp and spasm  Cervicalgia  Abnormal posture     Problem List Patient Active Problem List   Diagnosis Date Noted  . Body mass index 40.0-44.9, adult ([facility]) 02/10/2019  . Urinary incontinence 01/02/2018  . Left groin pain 01/02/2018  . Allergic rhinitis 01/02/2018  . Healthcare maintenance 09/12/2017  . Acute nonintractable headache 02/03/2017  . Abnormal stress test   . Symptomatic sinus bradycardia   . Left sided numbness   . Muscle fasciculation   . Primary osteoarthritis of right knee 07/23/2016  . Plantar fasciitis of left foot 05/07/2016  . 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 Dizziness 03/21/2015  . Shortness of breath 03/21/2015  . OSA on CPAP   . PAF (paroxysmal atrial fibrillation) ([facility])   . Palpitations   . Chest pain 03/03/2015  . Left knee pain 10/13/2014  . Atrial fibrillation ([facility]) 03/14/2014  . Right knee pain 11/10/2012  . Sinusitis 10/23/2012  . Left hip pain 06/17/2011  . OSA (obstructive sleep apnea) 05/17/2011  . Chronic back pain 10/29/2010  . INSOMNIA 09/19/2008  . Gout 05/13/2007  . Hyperlipidemia 10/02/2006  . Morbid obesity ([facility]) 10/02/2006  . Migraine 10/02/2006  . HYPERTENSION, BENIGN SYSTEMIC 10/02/2006  .  GASTROESOPHAGEAL REFLUX, NO   ESOPHAGITIS 10/02/2006    Chasse, Stephen M  PT 06/17/2019, 5:16 PM  [facility] [address] [place], [place], [address] Phone: [phone]   Fax:  [phone]  Name: [name] MRN: [id number] Date of Birth: 04/28/1966   

## 2019-06-17 NOTE — Patient Instructions (Signed)
Thoracic rotation RT and LT x 3-5 rep, hold 5-10 seconds daily with exhaling on rotation motion

## 2019-06-21 ENCOUNTER — Other Ambulatory Visit: Payer: Self-pay

## 2019-06-21 ENCOUNTER — Ambulatory Visit: Payer: 59

## 2019-06-21 DIAGNOSIS — R252 Cramp and spasm: Secondary | ICD-10-CM

## 2019-06-21 DIAGNOSIS — M542 Cervicalgia: Secondary | ICD-10-CM

## 2019-06-21 NOTE — Therapy (Addendum)
[facility] [place], [place], [address] Phone: [phone]   Fax:  [phone]  Physical Therapy Treatment/Discharge  Patient Details  Name: [name] MRN: [id number] Date of Birth: June 02, 1966 Referring Provider (PT): Wayne Netters, MD   Encounter Date: 06/21/2019  PT End of Session - 06/21/19 0833    Visit Number  7    Number of Visits  12    Date for PT Re-Evaluation  06/25/19    Authorization Type  [employer]    PT Start Time  0831    PT Stop Time  0914    PT Time Calculation (min)  43 min    Activity Tolerance  Patient tolerated treatment well    Behavior During Therapy  [facility] for tasks assessed/performed       Past Medical History:  Diagnosis Date  . Arthritis    "knees" (01/22/2017)  . Chronic lower back pain   . GERD (gastroesophageal reflux disease)   . Gout    "no flares in awhile" (01/22/2017)  . History of MRI of cervical spine 09/25/10   normal; left shoulder normal as well  . Hypertension   . Migraine    "~ q 6 months" (01/22/2017)  . Morbid obesity ([place])    "I've lost 43# in 15 weeks w/weight watchers" (01/22/2017)  . OSA on CPAP   . Paroxysmal atrial fibrillation ([place]) 7/15   chads2vasc score is 1    Past Surgical History:  Procedure Laterality Date  . CARDIAC CATHETERIZATION  05/2005   "no blockage"  . KNEE ARTHROSCOPY Right 02/2013  . LEFT HEART CATH AND CORONARY ANGIOGRAPHY N/A 01/27/2017   Procedure: Left Heart Cath and Coronary Angiography;  Surgeon: Wayne Sine, MD;  Location: [facility];  Service: Cardiovascular;  Laterality: N/A;    There were no vitals filed for this visit.  Subjective Assessment - 06/21/19 0834    Subjective  No pain. Have been doing HEP as able . Doing much better    Currently in Pain?  No/denies                       [facility] Adult PT Treatment/Exercise - 06/21/19 0001      Neck Exercises: Seated   Other Seated Exercise  Blue band  ER and hor abduction  3x10 blue band  then overhead 6# x 20    Other Seated Exercise  Thoracic rotation stretching with overpressure RT and LT       Ultrasound   Ultrasound Location  Lt neck     Ultrasound Parameters  100% 1,7 Wcm2, 1MHZ    Ultrasound Goals  Pain      Manual Therapy   Manual Therapy  Passive ROM    Joint Mobilization  PA mobs LT cervical spoine. Gr 3-4    Soft tissue mobilization  LT neck and traps               PT Short Term Goals - 06/10/19 1706      PT SHORT TERM GOAL #1   Title  He will be indpendent with initial HEP    Status  Achieved      PT SHORT TERM GOAL #2   Title  He will report pain as intermittant    Status  Achieved      PT SHORT TERM GOAL #3   Title  He will report pain generally decr 25% at [facility]    Status  Achieved  PT Long Term Goals - 06/21/19 0836      PT LONG TERM GOAL #1   Title  He will be indpendent with all hEP issued    Baseline  progressing    Status  On-going      PT LONG TERM GOAL #2   Title  He will report pain as intermittant and no more than 1-2/10 pain    Baseline  As high as 6/10 with lifting at work but eases off after topping.  Quick turns can cause pain.      PT LONG TERM GOAL #3   Title  He willbe able to turn head at work with 1-2 max pain    Baseline  occasionally 3-5 if done quickly    Status  On-going      PT LONG TERM GOAL #4   Baseline  no reported issues    Status  Achieved            Plan - 06/21/19 8416    Clinical Impression Statement  No complaints stating he felt good . will continue strength ans ROM    PT Treatment/Interventions  Taping;Passive range of motion;Dry needling;Manual techniques;Patient/family education;Therapeutic exercise;Electrical Stimulation;Moist Heat;Traction;Ultrasound;Iontophoresis 33m/ml Dexamethasone    PT Next Visit Plan  Manual for STW/ROM , modalities        strenghtennig       .    PT Home Exercise Plan  Cervical retraction and rotation, band ER  and hor abduciton  with neck stability,  thoracici rotation    Consulted and Agree with Plan of Care  Patient       Patient will benefit from skilled therapeutic intervention in order to improve the following deficits and impairments:  Decreased range of motion, Obesity, Pain, Postural dysfunction, Increased muscle spasms  Visit Diagnosis: Cramp and spasm  Cervicalgia     Problem List Patient Active Problem List   Diagnosis Date Noted  . Body mass index 40.0-44.9, adult ([place] 02/10/2019  . Urinary incontinence 01/02/2018  . Left groin pain 01/02/2018  . Allergic rhinitis 01/02/2018  . Healthcare maintenance 09/12/2017  . Acute nonintractable headache 02/03/2017  . Abnormal stress test   . Symptomatic sinus bradycardia   . Left sided numbness   . Muscle fasciculation   . Primary osteoarthritis of right knee 07/23/2016  . Plantar fasciitis of left foot 05/07/2016  . 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 Dizziness 03/21/2015  . Shortness of breath 03/21/2015  . OSA on CPAP   . PAF (paroxysmal atrial fibrillation) ([place]   . Palpitations   . Chest pain 03/03/2015  . Left knee pain 10/13/2014  . Atrial fibrillation ([place] 03/14/2014  . Right knee pain 11/10/2012  . Sinusitis 10/23/2012  . Left hip pain 06/17/2011  . OSA (obstructive sleep apnea) 05/17/2011  . Chronic back pain 10/29/2010  . INSOMNIA 09/19/2008  . Gout 05/13/2007  . Hyperlipidemia 10/02/2006  . Morbid obesity ([place] 10/02/2006  . Migraine 10/02/2006  . HYPERTENSION, BENIGN SYSTEMIC 10/02/2006  . GASTROESOPHAGEAL REFLUX, NO ESOPHAGITIS 10/02/2006    CDarrel Leblanc PT 06/21/2019, 9:16 AM  [facility] [place] [address]Phone: [phone]  Fax:  [phone] Name: TJacob CiceroMRN: [id number] of Birth: 906-16-1967 PHYSICAL THERAPY  DISCHARGE SUMMARY  Visits from Start of Care: 7 Current functional level related to goals / functional outcomes: See above.  He canceled last 2 appointments due to work conflicts and did not return. He was making improvements but pain not resolved. I would be happy to see him again if needed.    Remaining deficits: See above   Education / Equipment: HEP Plan: Patient agrees to discharge.  Patient goals were partially met. Patient is being discharged due to not returning since the last visit.  ?????   Pearson Forster PT  08/03/19

## 2019-06-24 ENCOUNTER — Ambulatory Visit: Payer: 59

## 2019-06-28 ENCOUNTER — Ambulatory Visit: Payer: 59 | Admitting: Family Medicine

## 2019-06-28 ENCOUNTER — Ambulatory Visit: Payer: 59

## 2019-07-05 ENCOUNTER — Encounter: Payer: Self-pay | Admitting: Family Medicine

## 2019-07-05 ENCOUNTER — Other Ambulatory Visit: Payer: Self-pay | Admitting: Cardiovascular Disease

## 2019-07-05 ENCOUNTER — Other Ambulatory Visit: Payer: Self-pay

## 2019-07-05 ENCOUNTER — Ambulatory Visit (INDEPENDENT_AMBULATORY_CARE_PROVIDER_SITE_OTHER): Payer: 59 | Admitting: Family Medicine

## 2019-07-05 VITALS — BP 146/80 | HR 69 | Ht 74.0 in

## 2019-07-05 DIAGNOSIS — Z6841 Body Mass Index (BMI) 40.0 and over, adult: Secondary | ICD-10-CM

## 2019-07-05 DIAGNOSIS — M722 Plantar fascial fibromatosis: Secondary | ICD-10-CM

## 2019-07-06 NOTE — Assessment & Plan Note (Signed)
Patient with point tenderness at the plantar surface at the anterior point of his calcaneal consistent with prior diagnosis of plantar fasciitis.  Patient with BMI over 45 and reduced activity level since he started gaining more weight and the coronavirus has closed on his gym.  He was given a PDF with exercises from the [place] and told to work on weight loss control with a referral to nutrition.  We discussed making a food diary for 3 days and try to correct the impression that he was gaining weight because it was "muscle "because he was working out so hard and that if he was gaining weight it is because he was eating more calories than he was burning.  Of note he did turn down referral to bariatric surgeon and he does have an orthopedic surgeon who is offering him a knee replacement surgery who is wanting him to lose 30 pounds so he says he is motivated to lose weight.

## 2019-07-06 NOTE — Assessment & Plan Note (Signed)
Patient with plantar fasciitis, also with left knee arthritis.  He says there is an Network engineer who wants to do a knee replacement on him but wants him to lose another 30 pounds.  He did accept a referral to nutrition to discuss ways to do this with diet but did refuse a bariatric surgery referral.  We discussed taking a food diary and try to track calories and that he needs to start reducing his overall intake if he is going to lose weight because he is unable to do significant amounts of exercise due to his joint pain.

## 2019-07-06 NOTE — Progress Notes (Signed)
Subjective:  [name] is a 53 y.o. male who presents to the [facility] today with a chief complaint of left heel pain.   HPI: Plantar fasciitis of left foot Patient with point tenderness at the plantar surface at the anterior point of his calcaneal consistent with prior diagnosis of plantar fasciitis.  This has been going on for approximately a week, no specific injury recalled.  Patient with BMI over 45 and reduced activity level since he started gaining more weight and the coronavirus has closed on his gym.   We discussed making a food diary for 3 days and try to correct the impression that he was gaining weight because it was "muscle "because he was working out so hard and that if he was gaining weight it is because he was eating more calories than he was burning.   Body mass index 40.0-44.9, adult [facility]) Patient with plantar fasciitis, also with left knee arthritis.  He says there is an Network engineer who wants to do a knee replacement on him but wants him to lose another 30 pounds.   We discussed taking a food diary and try to track calories and that he needs to start reducing his overall intake if he is going to lose weight because he is unable to do significant amounts of exercise due to his joint pain.  Objective:  Physical Exam: BP (!) 146/80   Pulse 69   Ht 6\' 2"  (1.88 m)   SpO2 97%   BMI 42.37 kg/m   Gen: NAD, pleasant and conversing comfortably CV: RRR with no murmurs appreciated Pulm: NWOB, CTAB with no crackles, wheezes, or rhonchi MSK: No sign of erythema, or infection or skin changes to left foot.  Physical exam shows no significant lateral or superior pain around calcaneus only at origination point of plantar fascia at anterior calcaneus. Skin: warm, dry Neuro: grossly normal, moves all extremities Psych: Normal affect and thought content  No results found for this or any previous visit (from the past 72 hour(s)).   Assessment/Plan:  Plantar fasciitis of left foot  Patient with point tenderness at the plantar surface at the anterior point of his calcaneal consistent with prior diagnosis of plantar fasciitis.  Patient with BMI over 45 and reduced activity level since he started gaining more weight and the coronavirus has closed on his gym.  He was given a PDF with exercises from the [place] and told to work on weight loss control with a referral to nutrition.  We discussed making a food diary for 3 days and try to correct the impression that he was gaining weight because it was "muscle "because he was working out so hard and that if he was gaining weight it is because he was eating more calories than he was burning.  Of note he did turn down referral to bariatric surgeon and he does have an orthopedic surgeon who is offering him a knee replacement surgery who is wanting him to lose 30 pounds so he says he is motivated to lose weight.  Body mass index 40.0-44.9, adult [facility]) Patient with plantar fasciitis, also with left knee arthritis.  He says there is an Network engineer who wants to do a knee replacement on him but wants him to lose another 30 pounds.  He did accept a referral to nutrition to discuss ways to do this with diet but did refuse a bariatric surgery referral.  We discussed taking a food diary and try to track calories  and that he needs to start reducing his overall intake if he is going to lose weight because he is unable to do significant amounts of exercise due to his joint pain.   Sherene Sires, DO FAMILY MEDICINE RESIDENT - PGY3 07/06/2019 8:47 AM

## 2019-07-20 ENCOUNTER — Telehealth: Payer: Self-pay | Admitting: Cardiovascular Disease

## 2019-07-20 NOTE — Telephone Encounter (Signed)
*  STAT* If patient is at the pharmacy, call can be transferred to refill team.   1. Which medications need to be refilled? (please list name of each medication and dose if known)  dofetilide (TIKOSYN) 500 MCG capsule  2. Which pharmacy/location (including street and city if local pharmacy) is medication to be sent to? [facility], [address]  3. Do they need a 30 day or 90 day supply? 30  Patient has an appointment to see Dr. Acie Fredrickson 12/18 but is worried he may not be able to get out of work.

## 2019-07-21 ENCOUNTER — Other Ambulatory Visit: Payer: Self-pay

## 2019-07-21 MED ORDER — DOFETILIDE 500 MCG PO CAPS: 500.0000 ug | ORAL_CAPSULE | Freq: Two times a day (BID) | ORAL | 0 refills | Status: DC

## 2019-07-22 ENCOUNTER — Encounter: Payer: Self-pay | Admitting: Cardiovascular Disease

## 2019-07-22 NOTE — Progress Notes (Signed)
[name] Date of Birth  10/03/1965       [facility] Office [address], [address], [place], [place]  [address]   [place], [place]  [address] [place]   Fax  [phone]     Fax [phone]  Problem List: 1. Atrial fibrillation 2. Hypertension 3. Gout 4. Gastroesophageal reflux disease 5. Obstructive sleep apnea 6. Hyperlipidemia  History of Present Illness: [name] is a 53 year old gentleman who was recently admitted to [facility] with an episode of rapid atrial fibrillation.  He converted on a diltiazem drip.  He did well until this past Saturday when He had another episode of atrial fibrillation.  He does not drink any alcohol.  Does not smoke cigarettes.  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  Echo : Left ventricle: The cavity size was normal. Wall thickness was increased in a pattern of mild LVH. Systolic function was normal. The estimated ejection fraction was in the range of 55% to 60%. Wall motion was normal; there were no regional wall motion abnormalities. Features are consistent with a pseudonormal left ventricular filling pattern, with concomitant abnormal relaxation and increased filling pressure (grade 2 diastolic dysfunction). - Aortic valve: There was no stenosis. - Mitral valve: There was no significant regurgitation. - Left atrium: The atrium was mildly dilated. - Right ventricle: The cavity size was mildly dilated. Systolic function was normal. - Right atrium: The atrium was mildly dilated. - Pulmonary arteries: No complete TR doppler jet so unable to estimate PA systolic pressure. - Systemic veins: IVC measured 2.1 cm with normal respirophasic variation, suggesting RA pressure 8 mmHg  He has done well.  He has joined the gym   Dec. 5, 2016:  [name] is seen today for follow-up on emergency room visit. He was seen in the ER for hypertension and atrial  fibrillation. Converted back to NSR while in the ER. Has had more paroxysmal atrial fib.  - had a bad episode 2 weeks ago .  Had run out of his Eliquis ( pharmacy could not get it for some reason )  Has been to the A-fib clinic  BP is higher - had more salt than usual yesterday .  BP is typically 120s / 80s. Has tried amlodipine and had episodes of hypotension .   June 22, 2018:  Time he is seen back today for follow-up of his paroxysmal atrial fibrillation.  He also has a history of hyperlipidemia and hypertension. Still has some rare episodes of AF    BP is mildly elevated.  Has been having some gout issues - is taking Indocin several at bedtime .  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  He has been on Tikosyn.  He needs a basic metabolic profile and magnesium levels drawn today. ECG on Aug. 26 shows NSR .  Normal QT  Having lots of joint issus  Wt = 340 lbs  ( up 18 lbs from last year )    Current Outpatient Medications on File Prior to Visit  Medication Sig Dispense Refill  . apixaban (ELIQUIS) 5 MG TABS tablet Take 1 tablet (5 mg total) by mouth 2 (two) times daily. [place]  tablet 1  . atorvastatin (LIPITOR) 20 MG tablet Take 1 tablet by mouth once daily 90 tablet 0  . baclofen (LIORESAL) 10 MG tablet Take 1 tablet (10 mg total) by mouth 2 (two) times daily as needed for muscle spasms. 30 each 0  . cetirizine (ZYRTEC) 10 MG tablet Take 1 tablet (10 mg total) by mouth daily. (Patient taking differently: Take 10 mg by mouth daily as needed for allergies. ) 30 tablet 11  . diltiazem (CARDIZEM) 30 MG tablet Take 1 tablet every 4 hours AS NEEDED for elevated heart rate (Patient taking differently: Take 30 mg by mouth every 4 (four) hours as needed. AS NEEDED for elevated heart rate) 30 tablet 1  . dofetilide (TIKOSYN)  500 MCG capsule Take 1 capsule (500 mcg total) by mouth 2 (two) times daily. 60 capsule 0  . fluticasone (FLONASE) 50 MCG/ACT nasal spray Place 2 sprays into both nostrils daily. (Patient taking differently: Place 2 sprays into both nostrils daily as needed for rhinitis. ) 16 g 6  . Hypromellose ([employer] GENTLE LUBRICANT EYE DROPS OP) Place 1 drop into both eyes daily as needed (dry eyes).    Marland Kitchen MITIGARE 0.6 MG CAPS TAKE 1 CAPSULE BY MOUTH EVERY OTHER DAY 45 capsule 0  . Multiple Vitamin (MULTIVITAMIN WITH MINERALS) TABS tablet Take 1 tablet by mouth at bedtime.    . Omega-3 Fatty Acids (FISH OIL) 500 MG CAPS Take 2,000 mg by mouth every morning.     Marland Kitchen omeprazole (PRILOSEC) 20 MG capsule Take 1 capsule by mouth twice daily 180 capsule 0  . TURMERIC PO Take 1 capsule by mouth every other day.      No current facility-administered medications on file prior to visit.    Allergies  Allergen Reactions  . Hydrochlorothiazide-Propranolol [Propranolol-Hctz] Other (See Comments)    Joints lock up  . Penicillins Other (See Comments)    Hives Makes joints stiff and unable to move Has patient had a PCN reaction causing immediate rash, facial/tongue/throat swelling, SOB or lightheadedness with hypotension: No Has patient had a PCN reaction causing severe rash involving mucus membranes or skin necrosis: No Has patient had a PCN reaction that required hospitalization No Has patient had a PCN reaction occurring within the last 10 years: No If all of the above answers are "NO", then may proceed with Cephalosporin use.    Past Medical History:  Diagnosis Date  . Arthritis    "knees" (01/22/2017)  . Chronic lower back pain   . GERD (gastroesophageal reflux disease)   . Gout    "no flares in awhile" (01/22/2017)  . History of MRI of cervical spine 09/25/10   normal; left shoulder normal as well  . Hypertension   . Migraine    "~ q 6 months" (01/22/2017)  . Morbid obesity ([place])    "I've lost 43# in 15  weeks w/weight watchers" (01/22/2017)  . OSA on CPAP   . Paroxysmal atrial fibrillation ([place]) 7/15   chads2vasc score is 1    Past Surgical History:  Procedure Laterality Date  . CARDIAC CATHETERIZATION  05/2005   "no blockage"  . KNEE ARTHROSCOPY Right 02/2013  . LEFT HEART CATH AND CORONARY ANGIOGRAPHY N/A 01/27/2017   Procedure: Left Heart Cath and Coronary Angiography;  Surgeon: Troy Sine, MD;  Location: [facility];  Service: Cardiovascular;  Laterality: N/A;    Social History   Tobacco Use  Smoking Status Former Smoker  . Packs/day: 1.00  . Years: 10.00  .  Pack years: 10.00  . Types: Cigarettes  . Quit date: 04/02/1994  . Years since quitting: 25.3  Smokeless Tobacco Former Systems developer  . Types: Chew  Tobacco Comment   "chewed in my teens"    Social History   Substance and Sexual Activity  Alcohol Use No  . Alcohol/week: 0.0 standard drinks   Comment: "stopped in 1994"    Family History  Problem Relation Age of Onset  . Atrial fibrillation Mother 70       had a stroke  . Hypertension Father   . Diabetes Father     Reviw of Systems:  Reviewed in the HPI.  All other systems are negative.  Physical Exam: There were no vitals taken for this visit.  GEN:  Middle age, obese male, NAD  HEENT: Normal NECK: No JVD; No carotid bruits LYMPHATICS: No lymphadenopathy CARDIAC: RRR , no murmurs, rubs, gallops RESPIRATORY:  Clear to auscultation without rales, wheezing or rhonchi  ABDOMEN: Soft, non-tender, non-distended MUSCULOSKELETAL:  No edema; No deformity  SKIN: Warm and dry NEUROLOGIC:  Alert and oriented x 3    ECG: Aug. 26, 2020:   NSR , 61,  Normal QTC  Assessment / Plan:   1. Atrial fibrillation -    On Tikosyn Check BMP and Mag today .   2. Hypertension-    BP is well controlled.    4. Gastroesophageal reflux disease  5. Obstructive sleep apnea    Mertie Moores, MD  07/22/2019 8:43 PM    [place]  [place],  [place], [place]  [address] Pager [phone] Phone: [phone]; Fax: [phone]

## 2019-07-23 ENCOUNTER — Ambulatory Visit: Payer: 59 | Admitting: Cardiovascular Disease

## 2019-07-23 ENCOUNTER — Other Ambulatory Visit: Payer: Self-pay

## 2019-07-23 ENCOUNTER — Encounter: Payer: Self-pay | Admitting: Cardiovascular Disease

## 2019-07-23 VITALS — BP 132/82 | HR 69 | Ht 74.0 in | Wt 340.0 lb

## 2019-07-23 DIAGNOSIS — I10 Essential (primary) hypertension: Secondary | ICD-10-CM | POA: Diagnosis not present

## 2019-07-23 DIAGNOSIS — I4819 Other persistent atrial fibrillation: Secondary | ICD-10-CM | POA: Diagnosis not present

## 2019-07-23 MED ORDER — DOFETILIDE 500 MCG PO CAPS: 500.0000 ug | ORAL_CAPSULE | Freq: Two times a day (BID) | ORAL | 3 refills | Status: DC

## 2019-07-23 NOTE — Patient Instructions (Signed)
Medication Instructions:  Your physician recommends that you continue on your current medications as directed. Please refer to the Current Medication list given to you today.  *If you need a refill on your cardiac medications before your next appointment, please call your pharmacy*  Lab Work: TODAY - magnesium, basic metabolic panel  If you have labs (blood work) drawn today and your tests are completely normal, you will receive your results only by: Marland Kitchen MyChart Message (if you have MyChart) OR . A paper copy in the mail If you have any lab test that is abnormal or we need to change your treatment, we will call you to review the results.  Testing/Procedures: None Ordered   Follow-Up: Your physician recommends that you schedule a follow-up appointment in: 6 months with [place], you and your health needs are our priority.  As part of our continuing mission to provide you with exceptional heart care, we have created designated Provider Care Teams.  These Care Teams include your primary Cardiologist (physician) and Advanced Practice Providers (APPs -  Physician Assistants and Nurse Practitioners) who all work together to provide you with the care you need, when you need it.  Your next appointment:   1 year(s)  The format for your next appointment:   In Person  Provider:   Richardson Dopp, PA-C, Robbie Lis, PA-C or Daune Perch, NP

## 2019-07-24 LAB — BASIC METABOLIC PANEL
BUN/Creatinine Ratio: 13 (ref 9–20)
BUN: 12 mg/dL (ref 6–24)
CO2: 23 mmol/L (ref 20–29)
Calcium: 9.4 mg/dL (ref 8.7–10.2)
Chloride: 106 mmol/L (ref 96–106)
Creatinine, Ser: 0.95 mg/dL (ref 0.76–1.27)
GFR calc Af Amer: 104 mL/min/{1.73_m2} (ref >59)
GFR calc non Af Amer: 90 mL/min/{1.73_m2} (ref >59)
Glucose: 100 mg/dL — ABNORMAL HIGH (ref 65–99)
Potassium: 4.3 mmol/L (ref 3.5–5.2)
Sodium: 143 mmol/L (ref 134–144)

## 2019-07-24 LAB — MAGNESIUM: Magnesium: 2 mg/dL (ref 1.6–2.3)

## 2019-07-26 ENCOUNTER — Ambulatory Visit (HOSPITAL_COMMUNITY): Payer: 59 | Admitting: Nurse Practitioner

## 2019-08-13 ENCOUNTER — Telehealth (INDEPENDENT_AMBULATORY_CARE_PROVIDER_SITE_OTHER): Payer: 59 | Admitting: Family Medicine

## 2019-08-13 ENCOUNTER — Other Ambulatory Visit: Payer: Self-pay

## 2019-08-13 DIAGNOSIS — M722 Plantar fascial fibromatosis: Secondary | ICD-10-CM | POA: Diagnosis not present

## 2019-08-16 NOTE — Progress Notes (Signed)
[facility] Telemedicine Visit  Patient consented to have virtual visit. Method of visit: Video was attempted, but technology challenges prevented patient from using video, so visit was conducted via telephone.  Encounter participants: Patient: [name] - located at home Provider: Guadalupe Dawn - located at [facility] Others (if applicable): None  Chief Complaint: Foot pain  HPI: 54 year old male who presents for bilateral foot pain left greater than right.  He states that the pain is "like a razor blade" in the midpoint of his hindfoot.  He states that this pain does feel the same as it has in the past.  He states the pain is much worse when he takes his first few steps of the day or if he has been sitting for long period of time.  The pain does gradually improve as he walks around.  He has been diagnosed on multiple occasions with plantar fascitis.  He has tried Tylenol and "some stretches" in the past which he feels are not been helpful for him.  He feels the origin of his pain are likely steel toed boots that he has to wear for work.  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  He has been very busy with his job recently and has to wear the steel toe boots.  He claims to be doing the stretches as prescribed and has been taking Tylenol some.  He has tried some tramadol in the past and feels like this is not work.  ROS: per HPI  Pertinent PMHx: Plantar fasciitis  Exam:  General: No acute distress, very pleasant Respiratory: No accessory muscle use, no distress Psych: Very pleasant, no tangential thought process Per patient report Left foot: Palpable tenderness at what sounds like the anterior portion of the calcaneal/plantar fascial insertion.  He states that he had a similar pain in his right foot but it is a little bit less.   Assessment/Plan:  Bilateral plantar fasciitis Patient is a weight and use of steel toe shoes are likely contributing to his ongoing  plantar fascial issues.  He can continue doing the stretches and Tylenol for relief.  NSAIDs contraindicated for him secondary to his Eliquis use.  I did recommend trying some topical liniments such as Voltaren gel, Biofreeze, or Salonpas patches.  I believe he would get the most benefit from orthotics which she could likely insert into his steel toed boots as well as more casual shoe wear that he has.  I did briefly touch on weight loss as this would be helpful for him as well. -Referral to sports medicine for orthotic creation, could consider diagnostic ultrasound if necessary -Tylenol 325 every 4 hours as needed for pain -Can try topical liniments -Recommended ice for 15 minutes/day 2 times per day, heat for pain relief in the meantime -Recommended patient continue doing his stretches    Time spent during visit with patient: 15 minutes

## 2019-08-16 NOTE — Assessment & Plan Note (Signed)
Patient is a weight and use of steel toe shoes are likely contributing to his ongoing plantar fascial issues.  He can continue doing the stretches and Tylenol for relief.  NSAIDs contraindicated for him secondary to his Eliquis use.  I did recommend trying some topical liniments such as Voltaren gel, Biofreeze, or Salonpas patches.  I believe he would get the most benefit from orthotics which she could likely insert into his steel toed boots as well as more casual shoe wear that he has.  I did briefly touch on weight loss as this would be helpful for him as well. -Referral to sports medicine for orthotic creation, could consider diagnostic ultrasound if necessary -Tylenol 325 every 4 hours as needed for pain -Can try topical liniments -Recommended ice for 15 minutes/day 2 times per day, heat for pain relief in the meantime -Recommended patient continue doing his stretches

## 2019-08-18 ENCOUNTER — Other Ambulatory Visit: Payer: Self-pay | Admitting: Cardiovascular Disease

## 2019-08-23 ENCOUNTER — Other Ambulatory Visit: Payer: Self-pay

## 2019-08-23 ENCOUNTER — Ambulatory Visit: Payer: 59 | Admitting: Family Medicine

## 2019-08-23 DIAGNOSIS — M722 Plantar fascial fibromatosis: Secondary | ICD-10-CM

## 2019-08-23 MED ORDER — METHYLPREDNISOLONE ACETATE 40 MG/ML IJ SUSP
40.0000 mg | Freq: Once | INTRAMUSCULAR | Status: AC
  Administered 2019-08-23: 16:00:00 40 mg via INTRA_ARTICULAR

## 2019-08-23 NOTE — Assessment & Plan Note (Addendum)
Tender to palpation on the plantar side of right heel.  Ongoing for 6 months and worsening.  Has tried Tylenol, cannot take NSAIDs due to Eliquis use.  Has tried a lotion OTC.  Discussed using multiple treatment modalities with patient.  Patient desires steroid injection to help him get back to normal work function faster. -Orthotic inserts with arch support -Arch binder -Home exercises daily -Steroid injection with ultrasound guidance.  Risks and benefits of procedure were discussed, patient signed consent form.  BP began, Depo-Medrol and 2:1 ratio was drawn up.  Timeout was performed.  Area was cleaned with alcohol, injection was performed with ultrasound guidance.  No bleeding was noted.  Patient tolerated procedure well.

## 2019-08-23 NOTE — Patient Instructions (Signed)
You have plantar fasciitis Take tylenol as needed for pain  Topical aspercreme or biofreeze up to 4 times a day may help with the pain. Plantar fascia stretch for 20-30 seconds (do 3 of these) in morning Lowering/raise on a step exercises 3 x 10 once or twice a day - this is very important for long term recovery. Can add heel walks, toe walks forward and backward as well Ice heel for 15 minutes as needed. Avoid flat shoes/barefoot walking as much as possible. Arch straps have been shown to help with pain. Wear sports insoles with scaphoid pads when up and walking around. Steroid injection is a consideration for short term pain relief if you are struggling - you were given this today. Physical therapy is also an option. Follow up with me in 5-6 weeks.

## 2019-08-23 NOTE — Progress Notes (Signed)
   HPI  CC: Right heel pain  Traumatic: No  Location: Right heel Quality: Soreness with some paresthesia (type of pain)  Duration: 6 months Timing: All day but worse in the morning or after extended sitting Improving/Worsening: Worsening Makes better:improved with continued ambulation  Makes worse: Worse when pressing on the pedals at work, worse in the morning, Associated symptoms: None  Worsening right heel pain that is worse in the morning or after extended periods of sitting, improves with continued ambulation.  Progressively becoming more frequent over the past 6 months.  No inciting trauma patient can recall.  Previous Interventions Tried: Tylenol, tea tree lotion  Past Surgeries: Knee arthroscopy Smoking: Former smoker  ROS: Per HPI; in addition no fever, no rash, no additional weakness, no additional numbness, no additional paresthesias, and no additional falls/injury.   Objective: BP (!) 142/91   Ht 6\' 2"  (1.88 m)   Wt (!) 325 lb (147.4 kg)   BMI 41.73 kg/m  Gen: Alert and oriented, no acute distress.  Morbidly obese middle-age male CV: Well-perfused. Warm.  Resp: Non-labored.  Neuro: Sensation intact in feet bilaterally MSK: Feet appear symmetric, no swelling.  Palpation of the forefoot, arch and heel cause generalized tenderness of the right heel.  Pain when squeezing the calcaneus, minor tenderness at the Achilles tendon distally.  Pain with resisted dorsiflexion.  FROM with 5/5 strength all motions.  Negative thompsons, ant drawer, talar tilt. Gait: Patient walks with a limp secondary to pain on the right side.  Ultrasound: Visualization of posterior fascia was limited due to thickness of patient's heel fat pad.    Assessment and Plan:  Plantar fasciitis of right foot Tender to palpation on the plantar side of right heel.  Ongoing for 6 months and worsening.  Has tried Tylenol, cannot take NSAIDs due to Eliquis use.  Has tried a lotion OTC.  Discussed using  multiple treatment modalities with patient.  Patient desires steroid injection to help him get back to normal work function faster. -Orthotic inserts with arch support -Arch binder -Home exercises daily -Steroid injection with ultrasound guidance.  Risks and benefits of procedure were discussed, patient signed consent form, timeout was performed.  Patient was seated on exam table.  Area overlying medial proximal plantar fascia prepped with alcohol swab then utilizing ultrasound guidance, patient's right plantar fascia was injected with 2:1 bupivicaine:depomedrol.  Patient tolerated procedure well without immediate complications.   Clemetine Marker, MD [name] 08/23/2019 2:33 PM

## 2019-08-24 ENCOUNTER — Encounter: Payer: Self-pay | Admitting: Family Medicine

## 2019-08-26 ENCOUNTER — Encounter: Payer: Self-pay | Admitting: *Deleted

## 2019-08-29 ENCOUNTER — Other Ambulatory Visit: Payer: Self-pay | Admitting: Family Medicine

## 2019-09-27 ENCOUNTER — Encounter: Payer: Self-pay | Admitting: Family Medicine

## 2019-09-27 ENCOUNTER — Other Ambulatory Visit: Payer: Self-pay

## 2019-09-27 ENCOUNTER — Ambulatory Visit: Payer: 59 | Admitting: Family Medicine

## 2019-09-27 VITALS — BP 146/84 | Ht 74.0 in | Wt 319.0 lb

## 2019-09-27 DIAGNOSIS — M722 Plantar fascial fibromatosis: Secondary | ICD-10-CM

## 2019-09-27 NOTE — Patient Instructions (Signed)
Keep up the good work! Take tylenol as needed for pain  Topical aspercreme or biofreeze up to 4 times a day may help with the pain. Plantar fascia stretch for 20-30 seconds (do 3 of these) in morning Lowering/raise on a step exercises 3 x 10 once or twice a day - this is very important for long term recovery. Can add heel walks, toe walks forward and backward as well Ice heel for 15 minutes as needed. Avoid flat shoes/barefoot walking as much as possible. Arch straps have been shown to help with pain - wear these when up and walking around. Wear sports insoles with scaphoid pads when up and walking around. Physical therapy is also an option. Follow up with me in 6-8 weeks.

## 2019-09-27 NOTE — Progress Notes (Signed)
PCP: [name], MD  Subjective:   HPI: Patient is a 54 y.o. male here for right heel pain.  08/23/19: Traumatic: No  Location: Right heel Quality: Soreness with some paresthesia (type of pain)  Duration: 6 months Timing: All day but worse in the morning or after extended sitting Improving/Worsening: Worsening Makes better:improved with continued ambulation  Makes worse: Worse when pressing on the pedals at work, worse in the morning, Associated symptoms: None  Worsening right heel pain that is worse in the morning or after extended periods of sitting, improves with continued ambulation.  Progressively becoming more frequent over the past 6 months.  No inciting trauma patient can recall.  Previous Interventions Tried: Tylenol, tea tree lotion  Past Surgeries: Knee arthroscopy Smoking: Former smoker  2/22: Patient reports he's mildly improved compared to last visit. Injection has helped. He's still wearing his green insoles with arch binders. Doing home exercises regularly especially the stretches. Pain worse first thing in the morning and if he sits for a while then goes to get up. Pain still plantar at the heel.  Past Medical History:  Diagnosis Date  . Arthritis    "knees" (01/22/2017)  . Chronic lower back pain   . GERD (gastroesophageal reflux disease)   . Gout    "no flares in awhile" (01/22/2017)  . History of MRI of cervical spine 09/25/10   normal; left shoulder normal as well  . Hypertension   . Migraine    "~ q 6 months" (01/22/2017)  . Morbid obesity ([place])    "I've lost 43# in 15 weeks w/weight watchers" (01/22/2017)  . OSA on CPAP   . Paroxysmal atrial fibrillation ([place]) 7/15   chads2vasc score is 1    Current Outpatient Medications on File Prior to Visit  Medication Sig Dispense Refill  . apixaban (ELIQUIS) 5 MG TABS tablet Take 1 tablet (5 mg total) by mouth 2 (two) times daily. 180 tablet 1  . atorvastatin (LIPITOR) 20 MG tablet Take 1 tablet  by mouth once daily 90 tablet 3  . baclofen (LIORESAL) 10 MG tablet Take 1 tablet (10 mg total) by mouth 2 (two) times daily as needed for muscle spasms. 30 each 0  . cetirizine (ZYRTEC) 10 MG tablet Take 1 tablet (10 mg total) by mouth daily. 30 tablet 11  . diltiazem (CARDIZEM) 30 MG tablet Take 1 tablet every 4 hours AS NEEDED for elevated heart rate 30 tablet 1  . dofetilide (TIKOSYN) 500 MCG capsule Take 1 capsule (500 mcg total) by mouth 2 (two) times daily. 180 capsule 3  . fluticasone (FLONASE) 50 MCG/ACT nasal spray Place 2 sprays into both nostrils daily. 16 g 6  . Hypromellose ([employer] GENTLE LUBRICANT EYE DROPS OP) Place 1 drop into both eyes daily as needed (dry eyes).    Marland Kitchen MITIGARE 0.6 MG CAPS TAKE 1 CAPSULE BY MOUTH EVERY OTHER DAY 45 capsule 0  . Multiple Vitamin (MULTIVITAMIN WITH MINERALS) TABS tablet Take 1 tablet by mouth at bedtime.    . Omega-3 Fatty Acids (FISH OIL) 500 MG CAPS Take 2,000 mg by mouth every morning.     Marland Kitchen omeprazole (PRILOSEC) 20 MG capsule Take 1 capsule by mouth twice daily 180 capsule 0  . TURMERIC PO Take 1 capsule by mouth every other day.      No current facility-administered medications on file prior to visit.    Past Surgical History:  Procedure Laterality Date  . CARDIAC CATHETERIZATION  05/2005   "no blockage"  .  KNEE ARTHROSCOPY Right 02/2013  . LEFT HEART CATH AND CORONARY ANGIOGRAPHY N/A 01/27/2017   Procedure: Left Heart Cath and Coronary Angiography;  Surgeon: Troy Sine, MD;  Location: [facility];  Service: Cardiovascular;  Laterality: N/A;    Allergies  Allergen Reactions  . Hydrochlorothiazide-Propranolol [Propranolol-Hctz] Other (See Comments)    Joints lock up  . Penicillins Other (See Comments)    Hives Makes joints stiff and unable to move Has patient had a PCN reaction causing immediate rash, facial/tongue/throat swelling, SOB or lightheadedness with hypotension: No Has patient had a PCN reaction causing severe rash  involving mucus membranes or skin necrosis: No Has patient had a PCN reaction that required hospitalization No Has patient had a PCN reaction occurring within the last 10 years: No If all of the above answers are "NO", then may proceed with Cephalosporin use.    Social History   Socioeconomic History  . Marital status: Married    Spouse name: Not on file  . Number of children: Not on file  . Years of education: Not on file  . Highest education level: Not on file  Occupational History  . Not on file  Tobacco Use  . Smoking status: Former Smoker    Packs/day: 1.00    Years: 10.00    Pack years: 10.00    Types: Cigarettes    Quit date: 04/02/1994    Years since quitting: 25.5  . Smokeless tobacco: Former Systems developer    Types: Chew  . Tobacco comment: "chewed in my teens"  Substance and Sexual Activity  . Alcohol use: No    Alcohol/week: 0.0 standard drinks    Comment: "stopped in 1994"  . Drug use: Not Currently    Types: Marijuana    Comment: "stopped marijuna in 1994"  . Sexual activity: Yes  Other Topics Concern  . Not on file  Social History Narrative   Lives in [place] with spouse.  3 children, all grown.   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 Difficulty of Paying Living Expenses: Not on file  Food Insecurity:   . Worried About Charity fundraiser in the Last Year: Not on file  . Ran Out of Food in the Last Year: Not on file  Transportation Needs:   . Lack of Transportation (Medical): Not on file  . Lack of Transportation (Non-Medical): Not on file  Physical Activity:   . Days of Exercise per Week: Not on file  . Minutes of Exercise per Session: Not on file  Stress:   . Feeling of Stress : Not on file  Social Connections:   . Frequency of Communication with Friends and Family: Not on file  . Frequency of Social Gatherings with Friends and Family: Not on file  . Attends  Religious Services: Not on file  . Active Member of Clubs or Organizations: Not on file  . Attends Archivist Meetings: Not on file  . Marital Status: Not on file  Intimate Partner Violence:   . Fear of Current or Ex-Partner: Not on file  . Emotionally Abused: Not on file  . Physically Abused: Not on file  . Sexually Abused: Not on file    Family History  Problem Relation Age of Onset  . Atrial fibrillation Mother 13       had a stroke  . Hypertension Father   . Diabetes Father  BP (!) 146/84   Ht 6\' 2"  (1.88 m)   Wt (!) 319 lb (144.7 kg)   BMI 40.96 kg/m   Review of Systems: See HPI above.     Objective:  Physical Exam:  Gen: NAD, comfortable in exam room  Right foot/ankle: No gross deformity, swelling, ecchymoses FROM with 5/5 strength without pain all directions. Mild TTP medial calcaneus at plantar fascia insertion. Negative ant drawer and talar tilt.   Negative syndesmotic compression. Negative calcaneal squeeze. Thompsons test negative. NV intact distally.   Assessment & Plan:  1. Right plantar fasciitis - mild improvement compared to last visit.  S/p injection.  Encouraged he continue with arch binders, arch support, home exercises.  Offered physical therapy - he would like to wait on this.  F/u in 6-8 weeks for reevaluation.

## 2019-10-01 ENCOUNTER — Encounter: Payer: Self-pay | Admitting: Orthopaedic Surgery

## 2019-10-01 ENCOUNTER — Other Ambulatory Visit: Payer: Self-pay

## 2019-10-01 ENCOUNTER — Ambulatory Visit: Payer: 59 | Admitting: Orthopaedic Surgery

## 2019-10-01 VITALS — Ht 73.0 in | Wt 330.0 lb

## 2019-10-01 DIAGNOSIS — Z6841 Body Mass Index (BMI) 40.0 and over, adult: Secondary | ICD-10-CM | POA: Diagnosis not present

## 2019-10-01 DIAGNOSIS — M1711 Unilateral primary osteoarthritis, right knee: Secondary | ICD-10-CM | POA: Diagnosis not present

## 2019-10-01 MED ORDER — BUPIVACAINE HCL 0.5 % IJ SOLN
2.0000 mL | INTRAMUSCULAR | Status: AC | PRN
  Administered 2019-10-01: 10:00:00 2 mL via INTRA_ARTICULAR

## 2019-10-01 MED ORDER — METHYLPREDNISOLONE ACETATE 40 MG/ML IJ SUSP
40.0000 mg | INTRAMUSCULAR | Status: AC | PRN
  Administered 2019-10-01: 40 mg via INTRA_ARTICULAR

## 2019-10-01 MED ORDER — LIDOCAINE HCL 1 % IJ SOLN
2.0000 mL | INTRAMUSCULAR | Status: AC | PRN
  Administered 2019-10-01: 10:00:00 2 mL

## 2019-10-01 NOTE — Progress Notes (Signed)
Office Visit Note   Patient: [name]           Date of Birth: February 14, 1966           MRN: [id number] Visit Date: 10/01/2019              Requested by: [name], [address] [place],  [place] [id number] PCP: [name], MD   Assessment & Plan: Visit Diagnoses:  1. Primary osteoarthritis of right knee   2. Body mass index 40.0-44.9, adult ([place])   3. Morbid obesity ([place])     Plan: Impression is right knee DJD exacerbation and effusion.  This was aspirated and injected with cortisone.  45 cc of fluid obtained.  We had a long discussion about continuing weight loss efforts.  Questions encouraged and answered.  Follow-up as needed.  The patient meets the AMA guidelines for Morbid (severe) obesity with a BMI > 40.0 and I have recommended weight loss.  Follow-Up Instructions: Return if symptoms worsen or fail to improve.   Orders:  No orders of the defined types were placed in this encounter.  No orders of the defined types were placed in this encounter.     Procedures: Large Joint Inj: R knee on 10/01/2019 9:33 AM Indications: pain Details: 22 G needle  Arthrogram: No  Medications: 40 mg methylPREDNISolone acetate 40 MG/ML; 2 mL lidocaine 1 %; 2 mL bupivacaine 0.5 % Aspirate: 45 mL clear Consent was given by the patient. Patient was prepped and draped in the usual sterile fashion.       Clinical Data: No additional findings.   Subjective: Chief Complaint  Patient presents with  . Right Knee - Pain    [name] comes in today for evaluation recent worsening of right knee pain.  He has pain throughout his knee pops and gives way has a grinding sensation.  Denies any injuries.  He does notice swelling in his knee.  He has been unable to work this past week.  He is currently on weight watchers.   Review of Systems  Constitutional: Negative.   All other systems reviewed and are negative.    Objective: Vital Signs: Ht 6\' 1"  (1.854 m)    Wt (!) 330 lb (149.7 kg)   BMI 43.54 kg/m   Physical Exam Vitals and nursing note reviewed.  Constitutional:      Appearance: He is well-developed.  Pulmonary:     Effort: Pulmonary effort is normal.  Abdominal:     Palpations: Abdomen is soft.  Skin:    General: Skin is warm.  Neurological:     Mental Status: He is alert and oriented to person, place, and time.  Psychiatric:        Behavior: Behavior normal.        Thought Content: Thought content normal.        Judgment: Judgment normal.     Ortho Exam Right knee exam shows a moderate joint effusion.  No significant medial joint line tenderness.  Slight restriction in range of motion secondary to effusion and pain. Specialty Comments:  No specialty comments available.  Imaging: No results found.   PMFS History: Patient Active Problem List   Diagnosis Date Noted  . Body mass index 40.0-44.9, adult ([place]) 02/10/2019  . Urinary incontinence 01/02/2018  . Left groin pain 01/02/2018  . Allergic rhinitis 01/02/2018  . Healthcare maintenance 09/12/2017  . Acute nonintractable headache 02/03/2017  . Abnormal stress test   . Symptomatic sinus  bradycardia   . Left sided numbness   . Muscle fasciculation   . Primary osteoarthritis of right knee 07/23/2016  . Plantar fasciitis of right foot 05/07/2016  . 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 Dizziness 03/21/2015  . Shortness of breath 03/21/2015  . OSA on CPAP   . PAF (paroxysmal atrial fibrillation) ([place])   . Palpitations   . Chest pain 03/03/2015  . Left knee pain 10/13/2014  . Atrial fibrillation ([place]) 03/14/2014  . Right knee pain 11/10/2012  . Sinusitis 10/23/2012  . Left hip pain 06/17/2011  . OSA (obstructive sleep apnea) 05/17/2011  . Chronic back pain 10/29/2010  . INSOMNIA 09/19/2008  . Gout 05/13/2007  . Hyperlipidemia 10/02/2006  . Morbid obesity ([place])  10/02/2006  . Migraine 10/02/2006  . HYPERTENSION, BENIGN SYSTEMIC 10/02/2006  . GASTROESOPHAGEAL REFLUX, NO ESOPHAGITIS 10/02/2006   Past Medical History:  Diagnosis Date  . Arthritis    "knees" (01/22/2017)  . Chronic lower back pain   . GERD (gastroesophageal reflux disease)   . Gout    "no flares in awhile" (01/22/2017)  . History of MRI of cervical spine 09/25/10   normal; left shoulder normal as well  . Hypertension   . Migraine    "~ q 6 months" (01/22/2017)  . Morbid obesity ([place])    "I've lost 43# in 15 weeks w/weight watchers" (01/22/2017)  . OSA on CPAP   . Paroxysmal atrial fibrillation ([facility]) 7/15   chads2vasc score is 1    Family History  Problem Relation Age of Onset  . Atrial fibrillation Mother 47       had a stroke  . Hypertension Father   . Diabetes Father     Past Surgical History:  Procedure Laterality Date  . CARDIAC CATHETERIZATION  05/2005   "no blockage"  . KNEE ARTHROSCOPY Right 02/2013  . LEFT HEART CATH AND CORONARY ANGIOGRAPHY N/A 01/27/2017   Procedure: Left Heart Cath and Coronary Angiography;  Surgeon: Troy Sine, MD;  Location: [facility];  Service: Cardiovascular;  Laterality: N/A;   Social History   Occupational History  . Not on file  Tobacco Use  . Smoking status: Former Smoker    Packs/day: 1.00    Years: 10.00    Pack years: 10.00    Types: Cigarettes    Quit date: 04/02/1994    Years since quitting: 25.5  . Smokeless tobacco: Former Systems developer    Types: Chew  . Tobacco comment: "chewed in my teens"  Substance and Sexual Activity  . Alcohol use: No    Alcohol/week: 0.0 standard drinks    Comment: "stopped in 1994"  . Drug use: Not Currently    Types: Marijuana    Comment: "stopped marijuna in 1994"  . Sexual activity: Yes

## 2019-10-27 ENCOUNTER — Other Ambulatory Visit: Payer: Self-pay

## 2019-10-27 ENCOUNTER — Ambulatory Visit (HOSPITAL_COMMUNITY)
Admission: RE | Admit: 2019-10-27 | Discharge: 2019-10-27 | Disposition: A | Discharge status: Home / Self Care | Payer: 59 | Source: Ambulatory Visit | Attending: Nurse Practitioner | Admitting: Nurse Practitioner

## 2019-10-27 ENCOUNTER — Encounter (HOSPITAL_COMMUNITY): Payer: Self-pay | Admitting: Nurse Practitioner

## 2019-10-27 VITALS — BP 128/80 | HR 56 | Ht 73.0 in | Wt 333.4 lb

## 2019-10-27 DIAGNOSIS — Z880 Allergy status to penicillin: Secondary | ICD-10-CM | POA: Diagnosis not present

## 2019-10-27 DIAGNOSIS — I480 Paroxysmal atrial fibrillation: Secondary | ICD-10-CM

## 2019-10-27 DIAGNOSIS — Z833 Family history of diabetes mellitus: Secondary | ICD-10-CM | POA: Insufficient documentation

## 2019-10-27 DIAGNOSIS — I10 Essential (primary) hypertension: Secondary | ICD-10-CM | POA: Diagnosis not present

## 2019-10-27 DIAGNOSIS — Z79899 Other long term (current) drug therapy: Secondary | ICD-10-CM | POA: Diagnosis not present

## 2019-10-27 DIAGNOSIS — Z6841 Body Mass Index (BMI) 40.0 and over, adult: Secondary | ICD-10-CM | POA: Insufficient documentation

## 2019-10-27 DIAGNOSIS — Z8249 Family history of ischemic heart disease and other diseases of the circulatory system: Secondary | ICD-10-CM | POA: Diagnosis not present

## 2019-10-27 DIAGNOSIS — Z888 Allergy status to other drugs, medicaments and biological substances status: Secondary | ICD-10-CM | POA: Insufficient documentation

## 2019-10-27 DIAGNOSIS — K219 Gastro-esophageal reflux disease without esophagitis: Secondary | ICD-10-CM | POA: Insufficient documentation

## 2019-10-27 DIAGNOSIS — I4891 Unspecified atrial fibrillation: Secondary | ICD-10-CM | POA: Diagnosis present

## 2019-10-27 DIAGNOSIS — Z87891 Personal history of nicotine dependence: Secondary | ICD-10-CM | POA: Insufficient documentation

## 2019-10-27 DIAGNOSIS — Z7901 Long term (current) use of anticoagulants: Secondary | ICD-10-CM | POA: Diagnosis not present

## 2019-10-27 DIAGNOSIS — G4733 Obstructive sleep apnea (adult) (pediatric): Secondary | ICD-10-CM | POA: Diagnosis not present

## 2019-10-27 LAB — BASIC METABOLIC PANEL
Anion gap: 12 (ref 5–15)
BUN: 12 mg/dL (ref 6–20)
CO2: 22 mmol/L (ref 22–32)
Calcium: 9.2 mg/dL (ref 8.9–10.3)
Chloride: 105 mmol/L (ref 98–111)
Creatinine, Ser: 0.91 mg/dL (ref 0.61–1.24)
GFR calc Af Amer: >60 mL/min (ref >60)
GFR calc non Af Amer: >60 mL/min (ref >60)
Glucose, Bld: 99 mg/dL (ref 70–99)
Potassium: 5.1 mmol/L (ref 3.5–5.1)
Sodium: 139 mmol/L (ref 135–145)

## 2019-10-27 LAB — MAGNESIUM: Magnesium: 2.2 mg/dL (ref 1.7–2.4)

## 2019-10-27 NOTE — Progress Notes (Addendum)
Patient ID: [name], male   DOB: 08-Mar-1966, 54 y.o.   MRN: [id number]     Primary Care Physician: [name], MD Referring Physician: Dr. Rayann Heman, [facility] f/u Cardiologist: Dr. Leonides Cave is a 54 y.o. male with a h/o PAF on  tikosyn. He feels that mostly he is staying in [place]. He did have afib last night after eating a high salt dinner at [facility]. He is in normal rhythm today. He is being compliant with Tikosyn/ eliquis. No bleeding issues.  He is using cpap. He is back at [employer] on the machines with social distancing in place.   F/u in afib clinic, 10/26/29. He asked to be seen today as he will notice fluttering in his chest but his HR will be lower in the 40's. He did notice one episode of afib with HR's in the 150's x 1. He notices more shortness of breath when he has the fluttering/slow HR spells. No change in health. Eye drop Xiidra, added 2 weeks ago for dry eye. Continues on eliquis 5 mg bid for a CHA2DS2VASc score of 1.   Today, he denies symptoms of palpitations, chest pain, shortness of breath, orthopnea, PND, lower extremity edema, dizziness, presyncope, syncope, or neurologic sequela. Positive for back pain.The patient is tolerating medications without difficulties and is otherwise without complaint today.   Past Medical History:  Diagnosis Date  . Arthritis    "knees" (01/22/2017)  . Chronic lower back pain   . GERD (gastroesophageal reflux disease)   . Gout    "no flares in awhile" (01/22/2017)  . History of MRI of cervical spine 09/25/10   normal; left shoulder normal as well  . Hypertension   . Migraine    "~ q 6 months" (01/22/2017)  . Morbid obesity ([place])    "I've lost 43# in 15 weeks w/weight watchers" (01/22/2017)  . OSA on CPAP   . Paroxysmal atrial fibrillation ([place]) 7/15   chads2vasc score is 1   Past Surgical History:  Procedure Laterality Date  . CARDIAC CATHETERIZATION  05/2005   "no blockage"  . KNEE ARTHROSCOPY Right 02/2013  . LEFT  HEART CATH AND CORONARY ANGIOGRAPHY N/A 01/27/2017   Procedure: Left Heart Cath and Coronary Angiography;  Surgeon: Troy Sine, MD;  Location: [place] [facility];  Service: Cardiovascular;  Laterality: N/A;    Current Outpatient Medications  Medication Sig Dispense Refill  . apixaban (ELIQUIS) 5 MG TABS tablet Take 1 tablet (5 mg total) by mouth 2 (two) times daily. 180 tablet 1  . atorvastatin (LIPITOR) 20 MG tablet Take 1 tablet by mouth once daily 90 tablet 3  . baclofen (LIORESAL) 10 MG tablet Take 1 tablet (10 mg total) by mouth 2 (two) times daily as needed for muscle spasms. 30 each 0  . cetirizine (ZYRTEC) 10 MG tablet Take 1 tablet (10 mg total) by mouth daily. 30 tablet 11  . diltiazem (CARDIZEM) 30 MG tablet Take 1 tablet every 4 hours AS NEEDED for elevated heart rate 30 tablet 1  . dofetilide (TIKOSYN) 500 MCG capsule Take 1 capsule (500 mcg total) by mouth 2 (two) times daily. 180 capsule 3  . fluticasone (FLONASE) 50 MCG/ACT nasal spray Place 2 sprays into both nostrils daily. 16 g 6  . Hypromellose ([employer] GENTLE LUBRICANT EYE DROPS OP) Place 1 drop into both eyes daily as needed (dry eyes).    Marland Kitchen MITIGARE 0.6 MG CAPS TAKE 1 CAPSULE BY MOUTH EVERY OTHER DAY  45 capsule 0  . Multiple Vitamin (MULTIVITAMIN WITH MINERALS) TABS tablet Take 1 tablet by mouth at bedtime.    . Omega-3 Fatty Acids (FISH OIL) 500 MG CAPS Take 2,000 mg by mouth every morning.     Marland Kitchen omeprazole (PRILOSEC) 20 MG capsule Take 1 capsule by mouth twice daily 180 capsule 0  . TURMERIC PO Take 1 capsule by mouth every other day.      No current facility-administered medications for this encounter.    Allergies  Allergen Reactions  . Hydrochlorothiazide-Propranolol [Propranolol-Hctz] Other (See Comments)    Joints lock up  . Penicillins Other (See Comments)    Hives Makes joints stiff and unable to move Has patient had a PCN reaction causing immediate rash, facial/tongue/throat swelling, SOB or  lightheadedness with hypotension: No Has patient had a PCN reaction causing severe rash involving mucus membranes or skin necrosis: No Has patient had a PCN reaction that required hospitalization No Has patient had a PCN reaction occurring within the last 10 years: No If all of the above answers are "NO", then may proceed with Cephalosporin use.    Social History   Socioeconomic History  . Marital status: Married    Spouse name: Not on file  . Number of children: Not on file  . Years of education: Not on file  . Highest education level: Not on file  Occupational History  . Not on file  Tobacco Use  . Smoking status: Former Smoker    Packs/day: 1.00    Years: 10.00    Pack years: 10.00    Types: Cigarettes    Quit date: 04/02/1994    Years since quitting: 25.5  . Smokeless tobacco: Former Systems developer    Types: Chew  . Tobacco comment: "chewed in my teens"  Substance and Sexual Activity  . Alcohol use: No    Alcohol/week: 0.0 standard drinks    Comment: "stopped in 1994"  . Drug use: Not Currently    Types: Marijuana    Comment: "stopped marijuna in 1994"  . Sexual activity: Yes  Other Topics Concern  . Not on file  Social History Narrative   Lives in [place] with spouse.  3 children, all grown.   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 Difficulty of Paying Living Expenses:   Food Insecurity:   . Worried About Charity fundraiser in the Last Year:   . Arboriculturist in the Last Year:   Transportation Needs:   . Film/video editor (Medical):   Marland Kitchen Lack of Transportation (Non-Medical):   Physical Activity:   . Days of Exercise per Week:   . Minutes of Exercise per Session:   Stress:   . Feeling of Stress :   Social Connections:   . Frequency of Communication with Friends and Family:   . Frequency of Social Gatherings with Friends and Family:   . Attends Religious Services:   .  Active Member of Clubs or Organizations:   . Attends Archivist Meetings:   Marland Kitchen Marital Status:   Intimate Partner Violence:   . Fear of Current or Ex-Partner:   . Emotionally Abused:   Marland Kitchen Physically Abused:   . Sexually Abused:     Family History  Problem Relation Age of Onset  . Atrial fibrillation Mother 4       had a stroke  . Hypertension Father   .  Diabetes Father     ROS- All systems are reviewed and negative except as per the HPI above  Physical Exam: There were no vitals filed for this visit.  GEN- The patient is well appearing, alert and oriented x 3 today.   Head- normocephalic, atraumatic Eyes-  Sclera clear, conjunctiva pink Ears- hearing intact Oropharynx- clear Neck- supple, no JVP Lymph- no cervical lymphadenopathy Lungs- Clear to ausculation bilaterally, normal work of breathing Heart- Regular rate and rhythm, no murmurs, rubs or gallops, PMI not laterally displaced GI- soft, NT, ND, + BS Extremities- no clubbing, cyanosis, or edema MS- no significant deformity or atrophy Skin- no rash or lesion Psych- euthymic mood, full affect Neuro- strength and sensation are intact  EKG-Sinus brady at 56 bpm, Pr int 192 ms, QRS int 96 ms, Qtc 420 ms Epic records reviewed  Assessment and Plan: 1. afib Pt noticing fluttering but with low HR's in the 40's ? Premature contractions?? Will place a zio patch x one week to further understand issue  Continue  dofetilide 500 mg bid, qtc stable Not on daily rate control  Continue eliquis  Continue  cpap Bmet/mag checked today  2. Obesity Continue weight loss efforts Avoid salt   3. HTN Stable   4.  CHA2DS2VASc score of 1 Continue  eliquis 5 mg bid     Will notify pt when results of zio patch are reviewed    Butch Penny C. Johneric Mcfadden, [facility] [address] [place], [place] [address] [phone]

## 2019-11-09 ENCOUNTER — Telehealth: Payer: Self-pay

## 2019-11-09 ENCOUNTER — Telehealth (HOSPITAL_COMMUNITY): Payer: Self-pay | Admitting: *Deleted

## 2019-11-09 NOTE — Telephone Encounter (Signed)
Agree with ED evaluation. ° °Carina Brown, MD  °Family Medicine Teaching Service  ° °

## 2019-11-09 NOTE — Telephone Encounter (Signed)
Patient calls nurse line stating he has been having intermittent chest tightness and SOB with exertion for the last 2 weeks. Patient complains of arm and back pain in association. Patient also stated he feels worse after eating a meal. I recommended ER or [place] care visit. Patient stated, "that's what my cardiologist office said too." I advised patient to call us tomorrow with an update.

## 2019-11-09 NOTE — Telephone Encounter (Signed)
Patient called in with complaints of chest tightness, shortness of breath, arm/back pain and intermittent dizziness going on for several days. He denies feeling out of rhythm. Occurs with and without exertion. Discussed with Wayne Palau NP - since no afib noted and continued chest tightness recommends proceeding to ER for further workup. Pt verbalized understanding of recommendations.

## 2019-11-10 ENCOUNTER — Encounter (HOSPITAL_COMMUNITY): Payer: Self-pay | Admitting: Emergency Medicine

## 2019-11-10 ENCOUNTER — Emergency Department (HOSPITAL_COMMUNITY)
Admission: EM | Admit: 2019-11-10 | Discharge: 2019-11-10 | Disposition: A | Discharge status: Home / Self Care | Payer: 59 | Attending: Emergency Medicine | Admitting: Emergency Medicine

## 2019-11-10 ENCOUNTER — Other Ambulatory Visit: Payer: Self-pay

## 2019-11-10 ENCOUNTER — Emergency Department (HOSPITAL_COMMUNITY): Payer: 59

## 2019-11-10 DIAGNOSIS — I10 Essential (primary) hypertension: Secondary | ICD-10-CM | POA: Insufficient documentation

## 2019-11-10 DIAGNOSIS — I4891 Unspecified atrial fibrillation: Secondary | ICD-10-CM | POA: Diagnosis not present

## 2019-11-10 DIAGNOSIS — I480 Paroxysmal atrial fibrillation: Secondary | ICD-10-CM

## 2019-11-10 DIAGNOSIS — I4819 Other persistent atrial fibrillation: Secondary | ICD-10-CM

## 2019-11-10 DIAGNOSIS — Z7901 Long term (current) use of anticoagulants: Secondary | ICD-10-CM | POA: Insufficient documentation

## 2019-11-10 DIAGNOSIS — Z87891 Personal history of nicotine dependence: Secondary | ICD-10-CM | POA: Insufficient documentation

## 2019-11-10 DIAGNOSIS — Z79899 Other long term (current) drug therapy: Secondary | ICD-10-CM | POA: Insufficient documentation

## 2019-11-10 DIAGNOSIS — R0789 Other chest pain: Secondary | ICD-10-CM | POA: Diagnosis not present

## 2019-11-10 LAB — CBC
HCT: 44.2 % (ref 39.0–52.0)
Hemoglobin: 14.2 g/dL (ref 13.0–17.0)
MCH: 27 pg (ref 26.0–34.0)
MCHC: 32.1 g/dL (ref 30.0–36.0)
MCV: 84.2 fL (ref 80.0–100.0)
Platelets: 220 10*3/uL (ref 150–400)
RBC: 5.25 MIL/uL (ref 4.22–5.81)
RDW: 13.7 % (ref 11.5–15.5)
WBC: 8.8 10*3/uL (ref 4.0–10.5)
nRBC: 0 % (ref 0.0–0.2)

## 2019-11-10 LAB — BASIC METABOLIC PANEL
Anion gap: 12 (ref 5–15)
BUN: 13 mg/dL (ref 6–20)
CO2: 24 mmol/L (ref 22–32)
Calcium: 9.3 mg/dL (ref 8.9–10.3)
Chloride: 107 mmol/L (ref 98–111)
Creatinine, Ser: 1 mg/dL (ref 0.61–1.24)
GFR calc Af Amer: >60 mL/min (ref >60)
GFR calc non Af Amer: >60 mL/min (ref >60)
Glucose, Bld: 127 mg/dL — ABNORMAL HIGH (ref 70–99)
Potassium: 3.7 mmol/L (ref 3.5–5.1)
Sodium: 143 mmol/L (ref 135–145)

## 2019-11-10 LAB — TROPONIN I (HIGH SENSITIVITY)
Troponin I (High Sensitivity): 8 ng/L (ref <18)
Troponin I (High Sensitivity): 8 ng/L (ref <18)

## 2019-11-10 MED ORDER — LIDOCAINE VISCOUS HCL 2 % MT SOLN
15.0000 mL | Freq: Once | OROMUCOSAL | Status: DC
  Filled 2019-11-10: qty 15

## 2019-11-10 MED ORDER — SODIUM CHLORIDE 0.9% FLUSH: 3.0000 mL | Freq: Once | INTRAVENOUS | Status: DC

## 2019-11-10 MED ORDER — ALUM & MAG HYDROXIDE-SIMETH 200-200-20 MG/5ML PO SUSP
30.0000 mL | Freq: Once | ORAL | Status: DC
  Filled 2019-11-10: qty 30

## 2019-11-10 NOTE — ED Provider Notes (Signed)
[facility] EMERGENCY DEPARTMENT Provider Note   CSN: [id number] Arrival date & time: 11/10/19  [id number]     History Chief Complaint  Patient presents with  . Chest Pain    [name] is a 54 y.o. male history GERD, morbid obesity, hypertension, proximal atrial fibrillation on Eliquis, hyperlipidemia, sleep apnea.  Patient reports 1 week of intermittent chest pain and palpitations.  Chest pain described as a sharp left-sided pain nonradiating no clear inciting events or aggravating/alleviating factors, mild intensity lasts several minutes before resolving without intervention.  Last episode of pain yesterday afternoon, currently pain-free.  Associated with palpitations which are described a fluttering sensation in the center of his chest, occur with the chest pain however also occur in the absence of chest pain.  These fluttering sensations have been intermittent as well lasting seconds-minutes.  Of note patient reports that he has been compliant with Eliquis and has not missed any doses.  Denies fever/chills, fall/injury, cough/hemoptysis, shortness of breath, abdominal pain, nausea/vomiting, diaphoresis, extremity swelling/color change or any additional concerns.  HPI     Past Medical History:  Diagnosis Date  . Arthritis    "knees" (01/22/2017)  . Chronic lower back pain   . GERD (gastroesophageal reflux disease)   . Gout    "no flares in awhile" (01/22/2017)  . History of MRI of cervical spine 09/25/10   normal; left shoulder normal as well  . Hypertension   . Migraine    "~ q 6 months" (01/22/2017)  . Morbid obesity ([place])    "I've lost 43# in 15 weeks w/weight watchers" (01/22/2017)  . OSA on CPAP   . Paroxysmal atrial fibrillation ([place]) 7/15   chads2vasc score is 1    Patient Active Problem List   Diagnosis Date Noted  . Body mass index 40.0-44.9, adult ([place]) 02/10/2019  . Urinary incontinence 01/02/2018  . Left groin pain 01/02/2018  . Allergic  rhinitis 01/02/2018  . Healthcare maintenance 09/12/2017  . Acute nonintractable headache 02/03/2017  . Abnormal stress test   . Symptomatic sinus bradycardia   . Left sided numbness   . Muscle fasciculation   . Primary osteoarthritis of right knee 07/23/2016  . Plantar fasciitis of right foot 05/07/2016  . 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 Dizziness 03/21/2015  . Shortness of breath 03/21/2015  . OSA on CPAP   . PAF (paroxysmal atrial fibrillation) ([place])   . Palpitations   . Chest pain 03/03/2015  . Left knee pain 10/13/2014  . Atrial fibrillation ([place]) 03/14/2014  . Right knee pain 11/10/2012  . Sinusitis 10/23/2012  . Left hip pain 06/17/2011  . OSA (obstructive sleep apnea) 05/17/2011  . Chronic back pain 10/29/2010  . INSOMNIA 09/19/2008  . Gout 05/13/2007  . Hyperlipidemia 10/02/2006  . Morbid obesity ([place]) 10/02/2006  . Migraine 10/02/2006  . HYPERTENSION, BENIGN SYSTEMIC 10/02/2006  . GASTROESOPHAGEAL REFLUX, NO ESOPHAGITIS 10/02/2006    Past Surgical History:  Procedure Laterality Date  . CARDIAC CATHETERIZATION  05/2005   "no blockage"  . KNEE ARTHROSCOPY Right 02/2013  . LEFT HEART CATH AND CORONARY ANGIOGRAPHY N/A 01/27/2017   Procedure: Left Heart Cath and Coronary Angiography;  Surgeon: Troy Sine, MD;  Location: [place] [facility];  Service: Cardiovascular;  Laterality: N/A;       Family History  Problem Relation Age of Onset  . Atrial fibrillation Mother 27  had a stroke  . Hypertension Father   . Diabetes Father     Social History   Tobacco Use  . Smoking status: Former Smoker    Packs/day: 1.00    Years: 10.00    Pack years: 10.00    Types: Cigarettes    Quit date: 04/02/1994    Years since quitting: 25.6  . Smokeless tobacco: Former Systems developer    Types: Chew  . Tobacco comment: "chewed in my teens"  Substance Use Topics  . Alcohol use: No      Alcohol/week: 0.0 standard drinks    Comment: "stopped in 1994"  . Drug use: Not Currently    Types: Marijuana    Comment: "stopped marijuna in 1994"    Home Medications Prior to Admission medications   Medication Sig Start Date End Date Taking? Authorizing Provider  acetaminophen (TYLENOL) 500 MG tablet Take 500 mg by mouth every 6 (six) hours as needed for mild pain or headache.   Yes [provider]  apixaban (ELIQUIS) 5 MG TABS tablet Take 1 tablet (5 mg total) by mouth 2 (two) times daily. 05/31/19  Yes Sherran Needs, NP  atorvastatin (LIPITOR) 20 MG tablet Take 1 tablet by mouth once daily Patient taking differently: Take 20 mg by mouth daily.  08/19/19  Yes Nahser, Wonda Cheng, MD  baclofen (LIORESAL) 10 MG tablet Take 1 tablet (10 mg total) by mouth 2 (two) times daily as needed for muscle spasms. 05/10/19  Yes [name], MD  cetirizine (ZYRTEC) 10 MG tablet Take 1 tablet (10 mg total) by mouth daily. 09/14/18  Yes [name], MD  diltiazem (CARDIZEM) 30 MG tablet Take 1 tablet every 4 hours AS NEEDED for elevated heart rate Patient taking differently: Take 30 mg by mouth every 4 (four) hours as needed (Elevated heart rate).  08/21/17  Yes Sherran Needs, NP  dofetilide (TIKOSYN) 500 MCG capsule Take 1 capsule (500 mcg total) by mouth 2 (two) times daily. 07/23/19  Yes Nahser, Wonda Cheng, MD  fluticasone (FLONASE) 50 MCG/ACT nasal spray Place 2 sprays into both nostrils daily. 09/14/18  Yes [name], MD  ibuprofen (ADVIL) 200 MG tablet Take 400 mg by mouth every 6 (six) hours as needed for headache.   Yes [provider]  MITIGARE 0.6 MG CAPS TAKE 1 CAPSULE BY MOUTH EVERY OTHER DAY Patient taking differently: Take 1 capsule by mouth every other day.  08/31/19  Yes [name], MD  Multiple Vitamin (MULTIVITAMIN WITH MINERALS) TABS tablet Take 1 tablet by mouth at bedtime.   Yes [provider]  Omega-3 Fatty Acids  (FISH OIL) 1000 MG CAPS Take 2,000 mg by mouth in the morning.    Yes [provider]  omeprazole (PRILOSEC) 20 MG capsule Take 1 capsule by mouth twice daily Patient taking differently: Take 20 mg by mouth in the morning and at bedtime.  08/31/19  Yes [name], MD  RESTASIS 0.05 % ophthalmic emulsion Place 1 drop into both eyes 2 (two) times daily.  10/07/19  Yes [provider]  TURMERIC PO Take 1 capsule by mouth every other day.    Yes [provider]  XIIDRA 5 % SOLN Place 1 drop into both eyes in the morning and at bedtime.  10/07/19  Yes [provider]    Allergies    Hydrochlorothiazide-propranolol [propranolol-hctz] and Penicillins  Review of Systems   Review of Systems Ten systems are reviewed and are negative  for acute change except as noted in the HPI  Physical Exam Updated Vital Signs BP (!) 146/96   Pulse 65   Temp 97.8 F (36.6 C) (Oral)   Resp (!) 24   Ht 6\' 2"  (1.88 m)   Wt 101.2 kg   SpO2 97%   BMI 28.63 kg/m   Physical Exam Constitutional:      General: He is not in acute distress.    Appearance: Normal appearance. He is well-developed. He is not ill-appearing or diaphoretic.  HENT:     Head: Normocephalic and atraumatic.     Right Ear: External ear normal.     Left Ear: External ear normal.     Nose: Nose normal.  Eyes:     General: Vision grossly intact. Gaze aligned appropriately.     Pupils: Pupils are equal, round, and reactive to light.  Neck:     Trachea: Trachea and phonation normal. No tracheal deviation.  Cardiovascular:     Rate and Rhythm: Normal rate. Rhythm irregular.     Pulses:          Dorsalis pedis pulses are 2+ on the right side and 2+ on the left side.  Pulmonary:     Effort: Pulmonary effort is normal. No respiratory distress.     Breath sounds: Normal breath sounds.  Abdominal:     General: There is no distension.     Palpations: Abdomen is soft.     Tenderness: There is no  abdominal tenderness. There is no guarding or rebound.  Musculoskeletal:        General: Normal range of motion.     Cervical back: Normal range of motion.  Skin:    General: Skin is warm and dry.  Neurological:     Mental Status: He is alert.     GCS: GCS eye subscore is 4. GCS verbal subscore is 5. GCS motor subscore is 6.     Comments: Speech is clear and goal oriented, follows commands Major Cranial nerves without deficit, no facial droop Moves extremities without ataxia, coordination intact  Psychiatric:        Behavior: Behavior normal.     ED Results / Procedures / Treatments   Labs (all labs ordered are listed, but only abnormal results are displayed) Labs Reviewed  BASIC METABOLIC PANEL - Abnormal; Notable for the following components:      Result Value   Glucose, Bld 127 (*)    All other components within normal limits  CBC  TROPONIN I (HIGH SENSITIVITY)  TROPONIN I (HIGH SENSITIVITY)    EKG EKG Interpretation  Date/Time:  Wednesday November 10 2019 06:45:03 EDT Ventricular Rate:  93 PR Interval:    QRS Duration: 96 QT Interval:  348 QTC Calculation: 432 R Axis:   28 Text Interpretation: recurrent Atrial fibrillation Confirmed by Blanchie Dessert [id number]) on 11/10/2019 9:39:44 AM   Radiology DG Chest 2 View  Result Date: 11/10/2019 CLINICAL DATA:  Chest pain EXAM: CHEST - 2 VIEW COMPARISON:  09/01/2018 FINDINGS: The heart size and mediastinal contours are within normal limits. Both lungs are clear. No pleural effusion or pneumothorax. The visualized skeletal structures are unremarkable. IMPRESSION: No acute process in the chest. Electronically Signed   By: Macy Mis M.D.   On: 11/10/2019 07:46    Procedures Procedures (including critical care time)  Medications Ordered in ED Medications  sodium chloride flush (NS) 0.9 % injection 3 mL (has no administration in time range)    ED Course  I have reviewed the triage vital signs and the nursing  notes.  Pertinent labs & imaging results that were available during my care of the patient were reviewed by me and considered in my medical decision making (see chart for details).  Clinical Course as of Nov 09 1449  Wed Nov 10, 2019  [address]   [BM]  [place]   [BM]  1425 Dr. Burt Knack   [BM]    Clinical Course User Index [BM] Gari Crown   MDM Rules/Calculators/A&P                      This patient complains of chest pain and palpitations, this involves an extensive number of treatment options, and is a complaint that carries with it a high risk of complications and morbidity.  The differential diagnosis includes A. fib RVR, STEMI, NSTEMI, arrhythmia, PE, pericarditis, pneumonia, pneumothorax, dissection, GERD, musculoskeletal pain among others.  I ordered, reviewed, and interpreted labs, which included CBC, BMP, troponin.  CBC within normal limits no leukocytosis or evidence of anemia.  High-sensitivity troponin of 8, delta troponin of 8 low suspicion for ACS.  BMP without emergent electrolyte abnormality or evidence of kidney injury.  As patient currently asymptomatic with reassuring labs no indication for emergent medications will monitor. I ordered imaging studies which included chest x-ray which shows "no acute process in the chest", I independently visualized and interpreted imaging and agree with radiologist interpretation. Previous records obtained and reviewed from A. fib clinic via EMR shows patient on Eliquis and Tikosyn.  It appears a ordered patient a Zio patch.  At 10:03 AM I consulted cardiology and discussed labs and findings they are coming to evaluate patient. - Patient was reevaluated multiple times each time he is resting comfortably using his phone no acute distress states understanding of care plan and is agreeable to await cardiology consultation.  Vital signs stable. - 1:50 PM: I discussed the case with cardiology physician assistant [place] who  is speaking with her attending physician about patient and will call back with plan. - Patient seen and evaluated by cardiology, patient has converted without intervention.  Dr. Burt Knack recommend discharge at this time and have scheduled patient to follow-up in the A. fib clinic. - 2:50 PM: Patient reevaluated sitting on edge of bed getting dressed resting comfortably no acute distress states understanding of care plan and has no questions and is requesting discharge.  At this time there does not appear to be any evidence of an acute emergency medical condition and the patient appears stable for discharge with appropriate outpatient follow up. Diagnosis was discussed with patient who verbalizes understanding of care plan and is agreeable to discharge. I have discussed return precautions with patient who verbalizes understanding of return precautions. Patient encouraged to follow-up with their PCP and cardiology. All questions answered.  Patient's case discussed with Dr. Maryan Rued who agrees with plan to discharge with follow-up.   Note: Portions of this report may have been transcribed using voice recognition software. Every effort was made to ensure accuracy; however, inadvertent computerized transcription errors may still be present. Final Clinical Impression(s) / ED Diagnoses Final diagnoses:  Atypical chest pain  Paroxysmal atrial fibrillation [facility])    Rx / DC Orders ED Discharge Orders    None       Gari Crown 11/10/19 1452    Blanchie Dessert, MD 11/11/19 [phone]

## 2019-11-10 NOTE — ED Triage Notes (Signed)
Left side cp for few weeks with SOB, pt is Afib on EKG on triage.

## 2019-11-10 NOTE — Consult Note (Addendum)
Cardiology Admission History and Physical:   Patient ID: [name] MRN: [id number]; DOB: 1966-05-08   Admission date: 11/10/2019  Primary Care Provider: [name], MD Primary Cardiologist: Mertie Moores, MD  Primary Electrophysiologist:  None   Chief Complaint:  Palpitations  Patient Profile:   [name] is a 54 y.o. male with pmh of Paroxysmal Afib on Eliquis and Tikosyn, obesity, HTN, gout, former smoker, GERD, OSA on CPAP, HLD who is being seen for chest pain and afib.   History of Present Illness:   [name] is followed by Dr. Acie Fredrickson and the Afib clinic. The patient was admitted in 01/2017 for chest pain. Stress test was abnormal and he was taken for cath. Cath showed normal coronary arteries. Echo showed EF 60-65%, g2DD. At follow up patient was stable.  Had a long-term monitor 01/23/2019 which showed sinus rhythm, rare PACs, sustained atrial tachycardia, no A. fib, no sustained arrhythmias.  He was last seen by the afib clinic 10/27/19 and was overall stable. He did notice an episode of afib with HR in the 150s and felt more sob. He was placed on a ziopatch.   Patient presented to the ED for 11/10/19 for increased palpitations. The patient has chronic afib and is historically symptomatic when he goes into afib. His rates in sinus are 60-70s and when he converts to afib rates go up to around 100bpm. When in afib the patient feels palpitations, sob and some lightheadedness and dizziness. Over the last week and a half his palpitations have gotten much worse. He had a ziopatch for 2 days and did not have many episodes while he had the patch. Once he took it off he started having more. He has about 10-12 episodes daily. They last 5-10 minutes and patient has to stop and catch his breath and wait for his rate to go back down. He does not have chest pain with these episodes. The palpitations are making it difficult for him to work. 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 He does have cardizem to take as needed but by the time he goes to take it his rates are back down and he is in sinus. He also reported chest pain, but this is not related to the palpitations. Chest pain has been going on for many years and is worse on palpation and movement. He feels it might be MSK but is not sure. It is on the left side of the chest wrapping around the side. He is right handed so is able to perform his job OK. He says pain is constant and he tolerates it around 6/10. Patient denies fever, chills, recent illness, lower leg edema, and orthopnea.   The ED blood pressure 151/105, pulse 61, afebrile, respiratory rate 18, 96% O2.  Labs showed potassium 3.7, creatinine 1, glucose 127.  WBC 8.8, hemoglobin 14.2. HS troponin 8>8. Chest x-ray unremarkable. EKG shows A. fib 93 bpm. Cardiology was consulted for possible admission.  Telemetry shows that patient converted to sinus this morning. He remains in sinus with rates 60-70s. Patient denies alcohol/tobacco/drug use.  Past Medical History:  Diagnosis Date  . Arthritis    "knees" (01/22/2017)  . Chronic lower back pain   . GERD (gastroesophageal reflux disease)   . Gout    "no flares in awhile" (01/22/2017)  . History of MRI of cervical spine 09/25/10   normal; left shoulder normal as well  . Hypertension   . Migraine    "~  q 6 months" (01/22/2017)  . Morbid obesity ([place])    "I've lost 43# in 15 weeks w/weight watchers" (01/22/2017)  . OSA on CPAP   . Paroxysmal atrial fibrillation ([place]) 7/15   chads2vasc score is 1    Past Surgical History:  Procedure Laterality Date  . CARDIAC CATHETERIZATION  05/2005   "no blockage"  . KNEE ARTHROSCOPY Right 02/2013  . LEFT HEART CATH AND CORONARY ANGIOGRAPHY N/A 01/27/2017   Procedure: Left Heart Cath and Coronary Angiography;  Surgeon: Troy Sine, MD;  Location: [facility];  Service: Cardiovascular;  Laterality: N/A;     Medications Prior to  Admission: Prior to Admission medications   Medication Sig Start Date End Date Taking? Authorizing Provider  acetaminophen (TYLENOL) 500 MG tablet Take 500 mg by mouth every 6 (six) hours as needed for mild pain or headache.   Yes [provider]  apixaban (ELIQUIS) 5 MG TABS tablet Take 1 tablet (5 mg total) by mouth 2 (two) times daily. 05/31/19  Yes Sherran Needs, NP  atorvastatin (LIPITOR) 20 MG tablet Take 1 tablet by mouth once daily Patient taking differently: Take 20 mg by mouth daily.  08/19/19  Yes Nahser, Wonda Cheng, MD  baclofen (LIORESAL) 10 MG tablet Take 1 tablet (10 mg total) by mouth 2 (two) times daily as needed for muscle spasms. 05/10/19  Yes [name], MD  cetirizine (ZYRTEC) 10 MG tablet Take 1 tablet (10 mg total) by mouth daily. 09/14/18  Yes [name], MD  diltiazem (CARDIZEM) 30 MG tablet Take 1 tablet every 4 hours AS NEEDED for elevated heart rate Patient taking differently: Take 30 mg by mouth every 4 (four) hours as needed (Elevated heart rate).  08/21/17  Yes Sherran Needs, NP  dofetilide (TIKOSYN) 500 MCG capsule Take 1 capsule (500 mcg total) by mouth 2 (two) times daily. 07/23/19  Yes Nahser, Wonda Cheng, MD  fluticasone (FLONASE) 50 MCG/ACT nasal spray Place 2 sprays into both nostrils daily. 09/14/18  Yes [name], MD  ibuprofen (ADVIL) 200 MG tablet Take 400 mg by mouth every 6 (six) hours as needed for headache.   Yes [provider]  MITIGARE 0.6 MG CAPS TAKE 1 CAPSULE BY MOUTH EVERY OTHER DAY Patient taking differently: Take 1 capsule by mouth every other day.  08/31/19  Yes [name], MD  Multiple Vitamin (MULTIVITAMIN WITH MINERALS) TABS tablet Take 1 tablet by mouth at bedtime.   Yes [provider]  Omega-3 Fatty Acids (FISH OIL) 1000 MG CAPS Take 2,000 mg by mouth in the morning.    Yes [provider]  omeprazole (PRILOSEC) 20 MG capsule Take 1 capsule by mouth twice  daily Patient taking differently: Take 20 mg by mouth in the morning and at bedtime.  08/31/19  Yes [name], MD  RESTASIS 0.05 % ophthalmic emulsion Place 1 drop into both eyes 2 (two) times daily.  10/07/19  Yes [provider]  TURMERIC PO Take 1 capsule by mouth every other day.    Yes [provider]  XIIDRA 5 % SOLN Place 1 drop into both eyes in the morning and at bedtime.  10/07/19  Yes [provider]     Allergies:    Allergies  Allergen Reactions  . Hydrochlorothiazide-Propranolol [Propranolol-Hctz] Other (See Comments)    Joints lock up  . Penicillins Other (See Comments)    Hives Makes joints stiff and unable to move Has patient had  a PCN reaction causing immediate rash, facial/tongue/throat swelling, SOB or lightheadedness with hypotension: No Has patient had a PCN reaction causing severe rash involving mucus membranes or skin necrosis: No Has patient had a PCN reaction that required hospitalization No Has patient had a PCN reaction occurring within the last 10 years: No If all of the above answers are "NO", then may proceed with Cephalosporin use.    Social History:   Social History   Socioeconomic History  . Marital status: Married    Spouse name: Not on file  . Number of children: Not on file  . Years of education: Not on file  . Highest education level: Not on file  Occupational History  . Not on file  Tobacco Use  . Smoking status: Former Smoker    Packs/day: 1.00    Years: 10.00    Pack years: 10.00    Types: Cigarettes    Quit date: 04/02/1994    Years since quitting: 25.6  . Smokeless tobacco: Former Systems developer    Types: Chew  . Tobacco comment: "chewed in my teens"  Substance and Sexual Activity  . Alcohol use: No    Alcohol/week: 0.0 standard drinks    Comment: "stopped in 1994"  . Drug use: Not Currently    Types: Marijuana    Comment: "stopped marijuna in 1994"  . Sexual activity: Yes  Other Topics Concern  .  Not on file  Social History Narrative   Lives in [place] with spouse.  3 children, all grown.   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 Difficulty of Paying Living Expenses:   Food Insecurity:   . Worried About Charity fundraiser in the Last Year:   . Arboriculturist in the Last Year:   Transportation Needs:   . Film/video editor (Medical):   Marland Kitchen Lack of Transportation (Non-Medical):   Physical Activity:   . Days of Exercise per Week:   . Minutes of Exercise per Session:   Stress:   . Feeling of Stress :   Social Connections:   . Frequency of Communication with Friends and Family:   . Frequency of Social Gatherings with Friends and Family:   . Attends Religious Services:   . Active Member of Clubs or Organizations:   . Attends Archivist Meetings:   Marland Kitchen Marital Status:   Intimate Partner Violence:   . Fear of Current or Ex-Partner:   . Emotionally Abused:   Marland Kitchen Physically Abused:   . Sexually Abused:     Family History:  The patient's family history includes Atrial fibrillation (age of onset: 33) in his mother; Diabetes in his father; Hypertension in his father.    ROS:  Please see the history of present illness.  All other ROS reviewed and negative.     Physical Exam/Data:   Vitals:   11/10/19 1015 11/10/19 1030 11/10/19 1045 11/10/19 1100  BP: 120/89 (!) 132/93 135/85 (!) 146/96  Pulse: 69 67 62 65  Resp: (!) 21 (!) 24    Temp:      TempSrc:      SpO2: 96% 95% 98% 97%  Weight:      Height:       No intake or output data in the 24 hours ending 11/10/19 1237 Last 3 Weights 11/10/2019 10/27/2019 10/01/2019  Weight (lbs) 223 lb 333 lb 6.4 oz 330 lb  Weight (kg) 101.152 kg 151.229 kg 149.687 kg     Body mass index is 28.63 kg/m.  General:  Well nourished, well developed, in no acute distress HEENT: normal Lymph: no adenopathy Neck: no JVD Endocrine:  No  thryomegaly Vascular: No carotid bruits; FA pulses 2+ bilaterally without bruits  Cardiac:  normal S1, S2; RRR; no murmur  Lungs:  clear to auscultation bilaterally, no wheezing, rhonchi or rales  Abd: soft, nontender, no hepatomegaly  Ext: no edema Musculoskeletal:  No deformities, BUE and BLE strength normal and equal Skin: warm and dry  Neuro:  CNs 2-12 intact, no focal abnormalities noted Psych:  Normal affect    EKG:  The ECG that was done 11/10/19 was personally reviewed and demonstrates EKG shows A. fib 93 bpm  Relevant CV Studies:  Echo 2018 - Left ventricle: The cavity size was mildly dilated. There was  moderate focal basal hypertrophy. Systolic function was normal.  The estimated ejection fraction was in the range of 60% to 65%.  Wall motion was normal; there were no regional wall motion  abnormalities. Features are consistent with a pseudonormal left  ventricular filling pattern, with concomitant abnormal relaxation  and increased filling pressure (grade 2 diastolic dysfunction).  Doppler parameters are consistent with high ventricular filling  pressure.  - Aorta: Ascending aorta diameter: 38 mm (ED).  - Ascending aorta: The ascending aorta was mildly dilated.  - Mitral valve: Valve area by pressure half-time: 2.27 cm^2.  - Right ventricle: The cavity size was severely dilated. Wall  thickness was normal.   Cardiac cath 2018  The left ventricular ejection fraction is 55-65% by visual estimate.  LV end diastolic pressure is normal.  The left ventricular systolic function is normal.   Normal LV function without focal segmental wall motion abnormalities with an ejection fraction estimated at 60%.  Normal coronary arteries.  RECOMMENDATION: Medical therapy for the patient's PAF with resumption of anticoagulation therapy.  False positive nuclear perfusion study most likely due to the patient's body habitus and marked diaphragmatic  attenuation.   Laboratory Data:  High Sensitivity Troponin:   Recent Labs  Lab 11/10/19 0656 11/10/19 0905  TROPONINIHS 8 8      Chemistry Recent Labs  Lab 11/10/19 0656  NA 143  K 3.7  CL 107  CO2 24  GLUCOSE 127*  BUN 13  CREATININE 1.00  CALCIUM 9.3  GFRNONAA >60  GFRAA >60  ANIONGAP 12    No results for input(s): PROT, ALBUMIN, AST, ALT, ALKPHOS, BILITOT in the last 168 hours. Hematology Recent Labs  Lab 11/10/19 0656  WBC 8.8  RBC 5.25  HGB 14.2  HCT 44.2  MCV 84.2  MCH 27.0  MCHC 32.1  RDW 13.7  PLT 220   BNPNo results for input(s): BNP, PROBNP in the last 168 hours.  DDimer No results for input(s): DDIMER in the last 168 hours.   Radiology/Studies:  DG Chest 2 View  Result Date: 11/10/2019 CLINICAL DATA:  Chest pain EXAM: CHEST - 2 VIEW COMPARISON:  09/01/2018 FINDINGS: The heart size and mediastinal contours are within normal limits. Both lungs are clear. No pleural effusion or pneumothorax. The visualized skeletal structures are unremarkable. IMPRESSION: No acute process in the chest. Electronically Signed   By: Macy Mis M.D.   On: 11/10/2019 07:46    Assessment and Plan:   Paroxysmal atrial fibrillation Patient presents with increase in palpitations/afib over the last week and a half. He is symptomatic and increased episodes are affecting  his job. EKG in the ED shows afib with controlled rates. While in the ED patient converted to sinus rhythm. Labs stable and CXR unremarkable.  - Patient regularly follows with Roderic Palau in the afib clinic. He had recent ziopatch but results are not read yet.  -Continue Eliquis -QTc 432 ms per EKG. Patient is on Tikosyn 500 mg twice daily at baseline -Cardizem 30 mg as needed for elevated heart rate - Patient is now in sinus rhythm and is stable from a cardiac perspective for discharge. No indication for admission. MD spoke with Roderic Palau and will see him back in the Afib clinic.   Atypical chest  pain/MSK - patient reported years of left sided chest pain - TTP on exam and worse with movement  Chronic diastolic heart failure -EF and 2018 60 to 65% -Euvolemic on exam  Hypertension - reasonable  Hyperlipidemia -Atorvastatin 20 mg daily -LDL  OSA -compliant with CPAP  Appointment made for 4/13 at 2:00 PM in the Afib clinic  For questions or updates, please contact [place] Please consult [email or web address] for contact info under   Signed, Cadence Ninfa Meeker, PA-C  11/10/2019 12:37 PM   Patient seen, examined. Available data reviewed. Agree with findings, assessment, and plan as outlined by Cadence Kathlen Mody, PA-C.  The patient is independently interviewed and examined.  He is an obese, pleasant male in no distress.  JVP is difficult to evaluate because of body habitus.  Carotid upstrokes are normal without bruits.  Lungs are clear, heart is regular rate and rhythm with no murmur or gallop, abdomen is soft and nontender with no masses or organomegaly.  Extremities have no pretibial edema.  Skin is warm and dry without rash.  Initial EKG is reviewed and shows atrial fibrillation with a heart rate in the 90s.  He has subsequently converted back to sinus rhythm now with a heart rate in the 60s.  Review of his telemetry demonstrates single PVCs.  High-sensitivity troponins are negative x2.  The patient has symptomatic atrial fibrillation despite dofetilide treatment.  We discussed potential treatment options including changing antiarrhythmic drug therapy, radiofrequency ablation, and continue to work on lifestyle modification.  I doubt the patient will be good candidate for ablation because of his obesity.  He will continue oral anticoagulation with apixaban.  I spoke with Orson Eva in the atrial fibrillation clinic and she will see him back in close follow-up.  At his relatively young age I do not think amiodarone would be a good option for him.  Medical treatment options seem somewhat limited.   We spoke about the importance of weight loss, diet, and exercise.  The patient is compliant with CPAP.  Regarding his chest pain, it is highly atypical and clearly reflects chest wall pain.  He has some tenderness to touch over the left chest wall.  He has been prescribed muscle relaxants and I have asked him to avoid this during the daytime hours.  From a cardiac perspective, he is stable for discharge from the emergency department.  Sherren Mocha, M.D. 11/10/2019 2:41 PM

## 2019-11-10 NOTE — Discharge Instructions (Signed)
You have been diagnosed today with atypical chest pain and paroxysmal atrial fibrillation.  At this time there does not appear to be the presence of an emergent medical condition, however there is always the potential for conditions to change. Please read and follow the below instructions.  Please return to the Emergency Department immediately for any new or worsening symptoms. Please be sure to follow up with your Primary Care Provider within one week regarding your visit today; please call their office to schedule an appointment even if you are feeling better for a follow-up visit. Please go to your appointment at the [facility] as scheduled.  Please take all of your daily medications as prescribed by your primary care provider and/or cardiologist.  Get help right away if: Your chest pain is worse. You have a cough that gets worse, or you cough up blood. You have very bad (severe) pain in your belly (abdomen). You pass out (faint). You have either of these for no clear reason: Sudden chest discomfort. Sudden discomfort in your arms, back, neck, or jaw. You have shortness of breath at any time. You suddenly start to sweat, or your skin gets clammy. You feel sick to your stomach (nauseous). You throw up (vomit). You suddenly feel lightheaded or dizzy. You feel very weak or tired. Your heart starts to beat fast, or it feels like it is skipping beats. You have any new/concerning or worsening of symptoms  Please read the additional information packets attached to your discharge summary.  Do not take your medicine if  develop an itchy rash, swelling in your mouth or lips, or difficulty breathing; call 911 and seek immediate emergency medical attention if this occurs.  Note: Portions of this text may have been transcribed using voice recognition software. Every effort was made to ensure accuracy; however, inadvertent computerized transcription errors may still be present.

## 2019-11-16 ENCOUNTER — Ambulatory Visit (HOSPITAL_COMMUNITY): Payer: 59 | Admitting: Nurse Practitioner

## 2019-11-21 ENCOUNTER — Other Ambulatory Visit (HOSPITAL_COMMUNITY): Payer: Self-pay | Admitting: Nurse Practitioner

## 2019-11-21 ENCOUNTER — Other Ambulatory Visit: Payer: Self-pay | Admitting: Family Medicine

## 2019-11-25 NOTE — Telephone Encounter (Signed)
Please ask patient to schedule a follow up appointment with me, thx [name], MD

## 2019-11-25 NOTE — Addendum Note (Signed)
Encounter addended by: Juluis Mire, RN on: 11/25/2019 2:43 PM  Actions taken: Imaging Exam ended

## 2019-11-26 NOTE — Telephone Encounter (Signed)
LMOVM for pt to call back and schedule an appt. Warren Kugelman Kennon Holter, CMA

## 2019-11-30 ENCOUNTER — Ambulatory Visit: Payer: 59 | Admitting: Family Medicine

## 2019-11-30 ENCOUNTER — Other Ambulatory Visit: Payer: Self-pay | Admitting: Family Medicine

## 2019-12-03 NOTE — Telephone Encounter (Signed)
Please ask patient to schedule follow up with me Thanks [name], MD

## 2019-12-06 ENCOUNTER — Encounter (HOSPITAL_COMMUNITY): Payer: Self-pay | Admitting: Nurse Practitioner

## 2019-12-06 ENCOUNTER — Ambulatory Visit: Payer: 59 | Admitting: Family Medicine

## 2019-12-06 ENCOUNTER — Other Ambulatory Visit: Payer: Self-pay

## 2019-12-06 ENCOUNTER — Ambulatory Visit (HOSPITAL_COMMUNITY)
Admission: RE | Admit: 2019-12-06 | Discharge: 2019-12-06 | Disposition: A | Discharge status: Home / Self Care | Payer: 59 | Source: Ambulatory Visit | Attending: Nurse Practitioner | Admitting: Nurse Practitioner

## 2019-12-06 VITALS — BP 160/88 | HR 57 | Ht 74.0 in | Wt 328.4 lb

## 2019-12-06 DIAGNOSIS — Z79899 Other long term (current) drug therapy: Secondary | ICD-10-CM | POA: Diagnosis not present

## 2019-12-06 DIAGNOSIS — Z87891 Personal history of nicotine dependence: Secondary | ICD-10-CM | POA: Diagnosis not present

## 2019-12-06 DIAGNOSIS — K219 Gastro-esophageal reflux disease without esophagitis: Secondary | ICD-10-CM | POA: Insufficient documentation

## 2019-12-06 DIAGNOSIS — G4733 Obstructive sleep apnea (adult) (pediatric): Secondary | ICD-10-CM | POA: Diagnosis not present

## 2019-12-06 DIAGNOSIS — Z833 Family history of diabetes mellitus: Secondary | ICD-10-CM | POA: Insufficient documentation

## 2019-12-06 DIAGNOSIS — I480 Paroxysmal atrial fibrillation: Secondary | ICD-10-CM | POA: Diagnosis not present

## 2019-12-06 DIAGNOSIS — D6869 Other thrombophilia: Secondary | ICD-10-CM

## 2019-12-06 DIAGNOSIS — Z8249 Family history of ischemic heart disease and other diseases of the circulatory system: Secondary | ICD-10-CM | POA: Diagnosis not present

## 2019-12-06 DIAGNOSIS — I10 Essential (primary) hypertension: Secondary | ICD-10-CM | POA: Insufficient documentation

## 2019-12-06 DIAGNOSIS — Z823 Family history of stroke: Secondary | ICD-10-CM | POA: Diagnosis not present

## 2019-12-06 DIAGNOSIS — Z7901 Long term (current) use of anticoagulants: Secondary | ICD-10-CM | POA: Diagnosis not present

## 2019-12-06 DIAGNOSIS — M1990 Unspecified osteoarthritis, unspecified site: Secondary | ICD-10-CM | POA: Insufficient documentation

## 2019-12-06 DIAGNOSIS — Z880 Allergy status to penicillin: Secondary | ICD-10-CM | POA: Insufficient documentation

## 2019-12-06 DIAGNOSIS — Z888 Allergy status to other drugs, medicaments and biological substances status: Secondary | ICD-10-CM | POA: Insufficient documentation

## 2019-12-06 DIAGNOSIS — I4891 Unspecified atrial fibrillation: Secondary | ICD-10-CM | POA: Diagnosis present

## 2019-12-06 DIAGNOSIS — Z6841 Body Mass Index (BMI) 40.0 and over, adult: Secondary | ICD-10-CM | POA: Diagnosis not present

## 2019-12-06 NOTE — Progress Notes (Signed)
Patient ID: [name], male   DOB: 01-Mar-1966, 54 y.o.   MRN: [id number]     Primary Care Physician: [name], MD Referring Physician: Dr. Rayann Heman, [facility] f/u Cardiologist: Dr. Leonides Cave is a 54 y.o. male with a h/o PAF on  tikosyn. He feels that mostly he is staying in [place]. He did have afib last night after eating a high salt dinner at [facility]. He is in normal rhythm today. He is being compliant with Tikosyn/ eliquis. No bleeding issues.  He is using cpap. He is back at [employer] on the machines with social distancing in place.   F/u in afib clinic, 10/26/29. He asked to be seen today as he will notice fluttering in his chest but his HR will be lower in the 40's. He did notice one episode of afib with HR's in the 150's x 1. He notices more shortness of breath when he has the fluttering/slow HR spells. No change in health. Eye drop Xiidra, added 2 weeks ago for dry eye. Continues on eliquis 5 mg bid for a CHA2DS2VASc score of 1.   F/u in the afib clinic, 5/3,  for f/u ER visit 4/7,  for chest pain. It was not felt to be cardiac in origin. He was in afib in the ER but converted. The  chest pain  was not associated with afib over the preceding days at home with same. Dr. Burt Knack saw on consult in the ER and cleared to go home. He wore a monitor that showed 7% afib burden. HE feels the chest pain was form stress dealing with his job. 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 He is not an ablation candidate for his weight at 328 lbs today. He is   back on [facility] for his weight loss efforts. He lost 50 lbs one time doing [employer], but went on vacation and got off [employer] at that time.   Today, he denies symptoms of palpitations, chest pain, shortness of breath, orthopnea, PND, lower extremity edema, dizziness, presyncope, syncope, or neurologic sequela. Positive for back pain.The patient is tolerating medications without difficulties and is otherwise  without complaint today.   Past Medical History:  Diagnosis Date  . Arthritis    "knees" (01/22/2017)  . Chronic lower back pain   . GERD (gastroesophageal reflux disease)   . Gout    "no flares in awhile" (01/22/2017)  . History of MRI of cervical spine 09/25/10   normal; left shoulder normal as well  . Hypertension   . Migraine    "~ q 6 months" (01/22/2017)  . Morbid obesity ([place])    "I've lost 43# in 15 weeks w/weight watchers" (01/22/2017)  . OSA on CPAP   . Paroxysmal atrial fibrillation ([place]) 7/15   chads2vasc score is 1   Past Surgical History:  Procedure Laterality Date  . CARDIAC CATHETERIZATION  05/2005   "no blockage"  . KNEE ARTHROSCOPY Right 02/2013  . LEFT HEART CATH AND CORONARY ANGIOGRAPHY N/A 01/27/2017   Procedure: Left Heart Cath and Coronary Angiography;  Surgeon: Troy Sine, MD;  Location: [place] [facility];  Service: Cardiovascular;  Laterality: N/A;    Current Outpatient Medications  Medication Sig Dispense Refill  . acetaminophen (TYLENOL) 500 MG tablet Take 500 mg by mouth every 6 (six) hours as needed for mild pain or headache.    Marland Kitchen atorvastatin (LIPITOR) 20 MG tablet Take 1  tablet by mouth once daily (Patient taking differently: Take 20 mg by mouth daily. ) 90 tablet 3  . baclofen (LIORESAL) 10 MG tablet Take 1 tablet (10 mg total) by mouth 2 (two) times daily as needed for muscle spasms. 30 each 0  . cetirizine (ZYRTEC) 10 MG tablet Take 1 tablet (10 mg total) by mouth daily. 30 tablet 11  . diltiazem (CARDIZEM) 30 MG tablet Take 1 tablet every 4 hours AS NEEDED for elevated heart rate (Patient taking differently: Take 30 mg by mouth every 4 (four) hours as needed (Elevated heart rate). ) 30 tablet 1  . dofetilide (TIKOSYN) 500 MCG capsule Take 1 capsule (500 mcg total) by mouth 2 (two) times daily. 180 capsule 3  . ELIQUIS 5 MG TABS tablet Take 1 tablet by mouth twice daily 180 tablet 3  . fluticasone (FLONASE) 50 MCG/ACT nasal spray Place 2 sprays  into both nostrils daily. 16 g 6  . MITIGARE 0.6 MG CAPS TAKE 1 CAPSULE BY MOUTH EVERY OTHER DAY 45 capsule 0  . Multiple Vitamin (MULTIVITAMIN WITH MINERALS) TABS tablet Take 1 tablet by mouth at bedtime.    . Omega-3 Fatty Acids (FISH OIL) 1000 MG CAPS Take 2,000 mg by mouth in the morning.     Marland Kitchen omeprazole (PRILOSEC) 20 MG capsule Take 1 capsule (20 mg total) by mouth in the morning and at bedtime. 180 capsule 0  . RESTASIS 0.05 % ophthalmic emulsion Place 1 drop into both eyes 2 (two) times daily.     . TURMERIC PO Take 1 capsule by mouth every other day.     Marland Kitchen XIIDRA 5 % SOLN Place 1 drop into both eyes in the morning and at bedtime.      No current facility-administered medications for this encounter.    Allergies  Allergen Reactions  . Hydrochlorothiazide-Propranolol [Propranolol-Hctz] Other (See Comments)    Joints lock up  . Penicillins Other (See Comments)    Hives Makes joints stiff and unable to move Has patient had a PCN reaction causing immediate rash, facial/tongue/throat swelling, SOB or lightheadedness with hypotension: No Has patient had a PCN reaction causing severe rash involving mucus membranes or skin necrosis: No Has patient had a PCN reaction that required hospitalization No Has patient had a PCN reaction occurring within the last 10 years: No If all of the above answers are "NO", then may proceed with Cephalosporin use.    Social History   Socioeconomic History  . Marital status: Married    Spouse name: Not on file  . Number of children: Not on file  . Years of education: Not on file  . Highest education level: Not on file  Occupational History  . Not on file  Tobacco Use  . Smoking status: Former Smoker    Packs/day: 1.00    Years: 10.00    Pack years: 10.00    Types: Cigarettes    Quit date: 04/02/1994    Years since quitting: 25.6  . Smokeless tobacco: Former Systems developer    Types: Chew  . Tobacco comment: "chewed in my teens"  Substance and Sexual  Activity  . Alcohol use: No    Alcohol/week: 0.0 standard drinks    Comment: "stopped in 1994"  . Drug use: Not Currently    Types: Marijuana    Comment: "stopped marijuna in 1994"  . Sexual activity: Yes  Other Topics Concern  . Not on file  Social History Narrative   Lives in [place] with spouse.  3 children, all grown.   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 Difficulty of Paying Living Expenses:   Food Insecurity:   . Worried About Charity fundraiser in the Last Year:   . Arboriculturist in the Last Year:   Transportation Needs:   . Film/video editor (Medical):   Marland Kitchen Lack of Transportation (Non-Medical):   Physical Activity:   . Days of Exercise per Week:   . Minutes of Exercise per Session:   Stress:   . Feeling of Stress :   Social Connections:   . Frequency of Communication with Friends and Family:   . Frequency of Social Gatherings with Friends and Family:   . Attends Religious Services:   . Active Member of Clubs or Organizations:   . Attends Archivist Meetings:   Marland Kitchen Marital Status:   Intimate Partner Violence:   . Fear of Current or Ex-Partner:   . Emotionally Abused:   Marland Kitchen Physically Abused:   . Sexually Abused:     Family History  Problem Relation Age of Onset  . Atrial fibrillation Mother 38       had a stroke  . Hypertension Father   . Diabetes Father     ROS- All systems are reviewed and negative except as per the HPI above  Physical Exam: Vitals:   12/06/19 0940  BP: (!) 160/88  Pulse: (!) 57  Weight: (!) 149 kg  Height: 6\' 2"  (1.88 m)    GEN- The patient is well appearing, alert and oriented x 3 today.   Head- normocephalic, atraumatic Eyes-  Sclera clear, conjunctiva pink Ears- hearing intact Oropharynx- clear Neck- supple, no JVP Lymph- no cervical lymphadenopathy Lungs- Clear to ausculation bilaterally, normal work of  breathing Heart- Regular rate and rhythm, no murmurs, rubs or gallops, PMI not laterally displaced GI- soft, NT, ND, + BS Extremities- no clubbing, cyanosis, or edema MS- no significant deformity or atrophy Skin- no rash or lesion Psych- euthymic mood, full affect Neuro- strength and sensation are intact  EKG-Sinus brady at 57 bpm, Pr int 194 ms, QRS int 96 ms, Qtc 418 ms Ziio monitor -Sinus rhythm Occasional premature atrial contractions Frequent afib with RVR is noted (afib burden is 7%) Rare nonsustained ventricular tachycardia Epic records reviewed  Assessment and Plan: 1. afib Afib burden is fairly  low and he is not an ablation candidate for this weight (328lbs)  Continue  dofetilide 500 mg bid, qtc stable, as I feel that no other approach is available  at this time to optimize SR other than  weight loss He is now back on Weight watchers  Not on daily rate control for bradycardia  Continue  cpap  2. Obesity Continue weight loss efforts Avoid salt  He is having knee/feet issues with arthritis that interfere with his ability to fully do his job the way he would like  Weight loss should  help with  these issues as well   3. HTN Stable   4.  CHA2DS2VASc score of 1 Continue  eliquis 5 mg bid   5. Chest pain Resolved  Pt feels stress induced   F/u in afib clinic in 3 months    Butch Penny C. Hideo Googe, [facility] [address] [place], [place] [address] [phone]

## 2019-12-27 ENCOUNTER — Ambulatory Visit: Payer: 59 | Admitting: Family Medicine

## 2020-01-24 ENCOUNTER — Ambulatory Visit (INDEPENDENT_AMBULATORY_CARE_PROVIDER_SITE_OTHER): Payer: 59 | Admitting: Family Medicine

## 2020-01-24 ENCOUNTER — Encounter: Payer: Self-pay | Admitting: Family Medicine

## 2020-01-24 ENCOUNTER — Other Ambulatory Visit: Payer: Self-pay

## 2020-01-24 VITALS — BP 126/82 | HR 76 | Ht 73.0 in | Wt 329.8 lb

## 2020-01-24 DIAGNOSIS — R234 Changes in skin texture: Secondary | ICD-10-CM | POA: Diagnosis not present

## 2020-01-24 DIAGNOSIS — M109 Gout, unspecified: Secondary | ICD-10-CM | POA: Diagnosis not present

## 2020-01-24 DIAGNOSIS — E785 Hyperlipidemia, unspecified: Secondary | ICD-10-CM | POA: Diagnosis not present

## 2020-01-24 MED ORDER — MITIGARE 0.6 MG PO CAPS: 0.6000 mg | ORAL_CAPSULE | Freq: Every day | ORAL | 0 refills | Status: DC

## 2020-01-24 MED ORDER — BETAMETHASONE DIPROPIONATE 0.05 % EX CREA: TOPICAL_CREAM | Freq: Two times a day (BID) | CUTANEOUS | 0 refills | Status: DC
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
## 2020-01-24 NOTE — Patient Instructions (Signed)
You can try taking colchicine daily Drink plenty of water  Checking cholesterol and syphilis test  Try cream on your hand Let me know if not improving  Be well, Dr. Ardelia Mems

## 2020-01-25 LAB — LIPID PANEL
Chol/HDL Ratio: 4.5 ratio (ref 0.0–5.0)
Cholesterol, Total: 136 mg/dL (ref 100–199)
HDL: 30 mg/dL — ABNORMAL LOW (ref >39)
LDL Chol Calc (NIH): 72 mg/dL (ref 0–99)
Triglycerides: 201 mg/dL — ABNORMAL HIGH (ref 0–149)
VLDL Cholesterol Cal: 34 mg/dL (ref 5–40)

## 2020-01-25 LAB — RPR: RPR Ser Ql: NONREACTIVE

## 2020-01-29 NOTE — Assessment & Plan Note (Addendum)
Update lipids today Continue lipitor

## 2020-01-29 NOTE — Assessment & Plan Note (Signed)
Patient desires to try colchicine daily rather than every other day  I think this is reasonable, since he has symptoms on the days he does not take it Will go to daily colchicine, if any side effects patient will let us know

## 2020-02-04 ENCOUNTER — Encounter: Payer: Self-pay | Admitting: Family Medicine

## 2020-02-20 ENCOUNTER — Other Ambulatory Visit: Payer: Self-pay | Admitting: Family Medicine

## 2020-03-05 ENCOUNTER — Other Ambulatory Visit: Payer: Self-pay | Admitting: Family Medicine

## 2020-03-06 MED ORDER — OMEPRAZOLE 20 MG PO CPDR: 20.0000 mg | DELAYED_RELEASE_CAPSULE | Freq: Two times a day (BID) | ORAL | 0 refills | Status: DC

## 2020-05-09 ENCOUNTER — Encounter (HOSPITAL_COMMUNITY): Payer: Self-pay | Admitting: Emergency Medicine

## 2020-05-09 ENCOUNTER — Emergency Department (HOSPITAL_COMMUNITY)
Admission: EM | Admit: 2020-05-09 | Discharge: 2020-05-10 | Disposition: A | Discharge status: Home / Self Care | Payer: 59 | Attending: Emergency Medicine | Admitting: Emergency Medicine

## 2020-05-09 ENCOUNTER — Emergency Department (HOSPITAL_COMMUNITY): Payer: 59

## 2020-05-09 ENCOUNTER — Other Ambulatory Visit: Payer: Self-pay

## 2020-05-09 DIAGNOSIS — Z7901 Long term (current) use of anticoagulants: Secondary | ICD-10-CM | POA: Diagnosis not present

## 2020-05-09 DIAGNOSIS — Z79899 Other long term (current) drug therapy: Secondary | ICD-10-CM | POA: Diagnosis not present

## 2020-05-09 DIAGNOSIS — I10 Essential (primary) hypertension: Secondary | ICD-10-CM | POA: Insufficient documentation

## 2020-05-09 DIAGNOSIS — R079 Chest pain, unspecified: Secondary | ICD-10-CM | POA: Insufficient documentation

## 2020-05-09 DIAGNOSIS — I480 Paroxysmal atrial fibrillation: Secondary | ICD-10-CM | POA: Insufficient documentation

## 2020-05-09 DIAGNOSIS — Z87891 Personal history of nicotine dependence: Secondary | ICD-10-CM | POA: Insufficient documentation

## 2020-05-09 DIAGNOSIS — R0789 Other chest pain: Secondary | ICD-10-CM

## 2020-05-09 LAB — CBC
HCT: 40.4 % (ref 39.0–52.0)
Hemoglobin: 13.3 g/dL (ref 13.0–17.0)
MCH: 26.7 pg (ref 26.0–34.0)
MCHC: 32.9 g/dL (ref 30.0–36.0)
MCV: 81.1 fL (ref 80.0–100.0)
Platelets: 238 10*3/uL (ref 150–400)
RBC: 4.98 MIL/uL (ref 4.22–5.81)
RDW: 13.5 % (ref 11.5–15.5)
WBC: 8.3 10*3/uL (ref 4.0–10.5)
nRBC: 0 % (ref 0.0–0.2)

## 2020-05-09 LAB — BASIC METABOLIC PANEL
Anion gap: 10 (ref 5–15)
BUN: 18 mg/dL (ref 6–20)
CO2: 24 mmol/L (ref 22–32)
Calcium: 9.6 mg/dL (ref 8.9–10.3)
Chloride: 106 mmol/L (ref 98–111)
Creatinine, Ser: 1.12 mg/dL (ref 0.61–1.24)
GFR calc non Af Amer: >60 mL/min (ref >60)
Glucose, Bld: 100 mg/dL — ABNORMAL HIGH (ref 70–99)
Potassium: 3.7 mmol/L (ref 3.5–5.1)
Sodium: 140 mmol/L (ref 135–145)

## 2020-05-09 LAB — TROPONIN I (HIGH SENSITIVITY)
Troponin I (High Sensitivity): 8 ng/L (ref <18)
Troponin I (High Sensitivity): 9 ng/L (ref <18)

## 2020-05-09 NOTE — ED Triage Notes (Addendum)
Pt presents to ED from home BIB [facility]. Pt c/o intermittent CP. Pain began while eating dinner. Per EMS pt is in and out of A. Fib. Pt missed multiple doses of Tikosyn last week. Recently exposed to COVID, tested negative. EMS given 324 ASA, nitro x2  EMS VS 142/83 HR - 71 RR - 16 O2 - 96% RA

## 2020-05-10 MED ORDER — KETOROLAC TROMETHAMINE 30 MG/ML IJ SOLN
15.0000 mg | Freq: Once | INTRAMUSCULAR | Status: AC
  Administered 2020-05-10: 15 mg via INTRAVENOUS
  Filled 2020-05-10: qty 1

## 2020-05-10 NOTE — ED Notes (Signed)
Patient reports keeps going in and out of afib and having chest pain. Chest pain is reproducible and worse when lifting or carrying things.  Also complains of neck pain.  No distress noted.

## 2020-05-10 NOTE — Discharge Instructions (Signed)
Follow up with your cardiologist and family doc.   Take 4 over the counter ibuprofen tablets 3 times a day or 2 over-the-counter naproxen tablets twice a day for pain. Also take tylenol 1000mg (2 extra strength) four times a day.

## 2020-05-10 NOTE — ED Provider Notes (Signed)
[facility] EMERGENCY DEPARTMENT Provider Note   CSN: [id number] Arrival date & time: 05/09/20  1948     History Chief Complaint  Patient presents with  . Chest Pain    [name] is a 54 y.o. male.  54 yo M with a cc of left-sided sharp chest pain.  Going on for a couple days.  Worse with movement and lifting things heavy at work.  Has had this recurrently in the past.  Denies cough or fever denies history of MI denies hypertension and hyperlipidemia diabetes smoking or family history of MI.  Denies history of PE or DVT denies hemoptysis denies unilateral lower extremity edema denies recent surgery immobilization or hospitalization denies testosterone use denies history of cancer.    The history is provided by the patient.  Chest Pain Pain location:  L lateral chest Pain quality: sharp   Pain radiates to:  Does not radiate Pain severity:  Moderate Onset quality:  Gradual Duration:  2 days Timing:  Constant Progression:  Worsening Chronicity:  New Relieved by:  Nothing Worsened by:  Nothing Ineffective treatments:  None tried Associated symptoms: no abdominal pain, no fever, no headache, no palpitations, no shortness of breath and no vomiting        Past Medical History:  Diagnosis Date  . Arthritis    "knees" (01/22/2017)  . Chronic lower back pain   . GERD (gastroesophageal reflux disease)   . Gout    "no flares in awhile" (01/22/2017)  . History of MRI of cervical spine 09/25/10   normal; left shoulder normal as well  . Hypertension   . Migraine    "~ q 6 months" (01/22/2017)  . Morbid obesity ([place])    "I've lost 43# in 15 weeks w/weight watchers" (01/22/2017)  . OSA on CPAP   . Paroxysmal atrial fibrillation ([place]) 7/15   chads2vasc score is 1    Patient Active Problem List   Diagnosis Date Noted  . Body mass index 40.0-44.9, adult ([place]) 02/10/2019  . Urinary incontinence 01/02/2018  . Left groin pain 01/02/2018  . Allergic rhinitis  01/02/2018  . Healthcare maintenance 09/12/2017  . Acute nonintractable headache 02/03/2017  . Abnormal stress test   . Symptomatic sinus bradycardia   . Left sided numbness   . Muscle fasciculation   . Primary osteoarthritis of right knee 07/23/2016  . Plantar fasciitis of right foot 05/07/2016  . 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 Dizziness 03/21/2015  . Shortness of breath 03/21/2015  . OSA on CPAP   . PAF (paroxysmal atrial fibrillation) ([place])   . Palpitations   . Chest pain 03/03/2015  . Left knee pain 10/13/2014  . Atrial fibrillation ([place]) 03/14/2014  . Right knee pain 11/10/2012  . Sinusitis 10/23/2012  . Left hip pain 06/17/2011  . OSA (obstructive sleep apnea) 05/17/2011  . Chronic back pain 10/29/2010  . INSOMNIA 09/19/2008  . Gout 05/13/2007  . Hyperlipidemia 10/02/2006  . Morbid obesity ([place]) 10/02/2006  . Migraine 10/02/2006  . HYPERTENSION, BENIGN SYSTEMIC 10/02/2006  . GASTROESOPHAGEAL REFLUX, NO ESOPHAGITIS 10/02/2006    Past Surgical History:  Procedure Laterality Date  . CARDIAC CATHETERIZATION  05/2005   "no blockage"  . KNEE ARTHROSCOPY Right 02/2013  . LEFT HEART CATH AND CORONARY ANGIOGRAPHY N/A 01/27/2017   Procedure: Left Heart Cath and Coronary Angiography;  Surgeon: Troy Sine, MD;  Location: [facility];  Service: Cardiovascular;  Laterality: N/A;       Family History  Problem Relation Age of Onset  . Atrial fibrillation Mother 41       had a stroke  . Hypertension Father   . Diabetes Father     Social History   Tobacco Use  . Smoking status: Former Smoker    Packs/day: 1.00    Years: 10.00    Pack years: 10.00    Types: Cigarettes    Quit date: 04/02/1994    Years since quitting: 26.1  . Smokeless tobacco: Former Systems developer    Types: Chew  . Tobacco comment: "chewed in my teens"  Vaping Use  . Vaping Use: Never used  Substance  Use Topics  . Alcohol use: No    Alcohol/week: 0.0 standard drinks    Comment: "stopped in 1994"  . Drug use: Not Currently    Types: Marijuana    Comment: "stopped marijuna in 1994"    Home Medications Prior to Admission medications   Medication Sig Start Date End Date Taking? Authorizing Provider  acetaminophen (TYLENOL) 500 MG tablet Take 500 mg by mouth every 6 (six) hours as needed for mild pain or headache.    [provider]  atorvastatin (LIPITOR) 20 MG tablet Take 1 tablet by mouth once daily Patient taking differently: Take 20 mg by mouth daily.  08/19/19   Nahser, Wonda Cheng, MD  betamethasone dipropionate 0.05 % cream Apply topically 2 (two) times daily. 01/24/20   [name], MD  cetirizine (ZYRTEC) 10 MG tablet Take 1 tablet (10 mg total) by mouth daily. Patient taking differently: Take 10 mg by mouth daily as needed.  09/14/18   [name], MD  diltiazem (CARDIZEM) 30 MG tablet Take 1 tablet every 4 hours AS NEEDED for elevated heart rate Patient taking differently: Take 30 mg by mouth every 4 (four) hours as needed (Elevated heart rate).  08/21/17   Sherran Needs, NP  dofetilide (TIKOSYN) 500 MCG capsule Take 1 capsule (500 mcg total) by mouth 2 (two) times daily. 07/23/19   Nahser, Wonda Cheng, MD  ELIQUIS 5 MG TABS tablet Take 1 tablet by mouth twice daily 11/22/19   Sherran Needs, NP  fluticasone [facility]) 50 MCG/ACT nasal spray Place 2 sprays into both nostrils daily. Patient taking differently: Place 2 sprays into both nostrils daily as needed.  09/14/18   [name], MD  MITIGARE 0.6 MG CAPS Take 0.6 mg by mouth daily. 01/24/20   [name], MD  Multiple Vitamin (MULTIVITAMIN WITH MINERALS) TABS tablet Take 1 tablet by mouth at bedtime.    [provider]  Omega-3 Fatty Acids (FISH OIL) 1000 MG CAPS Take 2,000 mg by mouth in the morning.     [provider]  omeprazole (PRILOSEC) 20 MG capsule Take 1 capsule  (20 mg total) by mouth 2 (two) times daily before a meal. 03/06/20   [name], MD  RESTASIS 0.05 % ophthalmic emulsion Place 1 drop into both eyes 2 (two) times daily.  10/07/19   [provider]  TURMERIC PO Take 1 capsule by mouth every other day.     [provider]  XIIDRA 5 % SOLN Place 1 drop into both eyes in the morning and at bedtime.  10/07/19   [provider]    Allergies    Hydrochlorothiazide-propranolol [propranolol-hctz] and Penicillins  Review of Systems   Review of Systems  Constitutional: Negative for chills and  fever.  HENT: Negative for congestion and facial swelling.   Eyes: Negative for discharge and visual disturbance.  Respiratory: Negative for shortness of breath.   Cardiovascular: Positive for chest pain. Negative for palpitations.  Gastrointestinal: Negative for abdominal pain, diarrhea and vomiting.  Musculoskeletal: Negative for arthralgias and myalgias.  Skin: Negative for color change and rash.  Neurological: Negative for tremors, syncope and headaches.  Psychiatric/Behavioral: Negative for confusion and dysphoric mood.    Physical Exam Updated Vital Signs BP 136/88   Pulse (!) 102   Temp 97.8 F (36.6 C) (Oral)   Resp 15   SpO2 98%   Physical Exam Vitals and nursing note reviewed.  Constitutional:      Appearance: He is well-developed.  HENT:     Head: Normocephalic and atraumatic.  Eyes:     Pupils: Pupils are equal, round, and reactive to light.  Neck:     Vascular: No JVD.  Cardiovascular:     Rate and Rhythm: Normal rate and regular rhythm.     Heart sounds: No murmur heard.  No friction rub. No gallop.   Pulmonary:     Effort: No respiratory distress.     Breath sounds: No wheezing.  Chest:     Chest wall: Tenderness (ttp about the L chest about the midclavicular line ribs 4-6 reproduces pain) present.  Abdominal:     General: There is no distension.     Tenderness: There is no guarding or  rebound.  Musculoskeletal:        General: Normal range of motion.     Cervical back: Normal range of motion and neck supple.  Skin:    Coloration: Skin is not pale.     Findings: No rash.  Neurological:     Mental Status: He is alert and oriented to person, place, and time.  Psychiatric:        Behavior: Behavior normal.     ED Results / Procedures / Treatments   Labs (all labs ordered are listed, but only abnormal results are displayed) Labs Reviewed  BASIC METABOLIC PANEL - Abnormal; Notable for the following components:      Result Value   Glucose, Bld 100 (*)    All other components within normal limits  CBC  TROPONIN I (HIGH SENSITIVITY)  TROPONIN I (HIGH SENSITIVITY)    EKG EKG Interpretation  Date/Time:  Tuesday May 09 2020 19:52:27 EDT Ventricular Rate:  89 PR Interval:    QRS Duration: 94 QT Interval:  388 QTC Calculation: 472 R Axis:   14 Text Interpretation: Atrial fibrillation Abnormal ECG When compared with ECG of 12/06/2019, Atrial fibrillation has replaced Sinus bradycardia Confirmed by Delora Fuel ([id number]) on 05/09/2020 9:50:57 PM   Radiology DG Chest 2 View  Result Date: 05/09/2020 CLINICAL DATA:  Chest pain EXAM: CHEST - 2 VIEW COMPARISON:  11/10/2019 FINDINGS: The cardiac silhouette, mediastinal and hilar contours are within normal limits and stable. The lungs are clear of an acute process. No pleural effusions. No pulmonary lesions. The bony thorax is intact. IMPRESSION: No acute cardiopulmonary findings. Electronically Signed   By: Marijo Sanes M.D.   On: 05/09/2020 20:13    Procedures Procedures (including critical care time)  Medications Ordered in ED Medications  ketorolac (TORADOL) 30 MG/ML injection 15 mg (15 mg Intravenous Given 05/10/20 0012)    ED Course  I have reviewed the triage vital signs and the nursing notes.  Pertinent labs & imaging results that were available during my care of  the patient were reviewed by me and  considered in my medical decision making (see chart for details).    MDM Rules/Calculators/A&P                          54 yo M with a cc of chest pain.  Atypical in nature.  Reproduced on exam.  EKG without concerning finding chest x-ray viewed by me without focal infiltrate or pneumothorax.  No significant anemia electrolytes are normal.  Delta troponin is negative.  Will discharge patient home.  PCP follow-up. Final Clinical Impression(s) / ED Diagnoses Final diagnoses:  Atypical chest pain    Rx / DC Orders ED Discharge Orders    None       Deno Etienne, DO 05/10/20 [id number]

## 2020-05-29 ENCOUNTER — Other Ambulatory Visit: Payer: Self-pay | Admitting: Family Medicine

## 2020-06-05 ENCOUNTER — Telehealth (HOSPITAL_COMMUNITY): Payer: Self-pay

## 2020-06-05 ENCOUNTER — Other Ambulatory Visit: Payer: Self-pay

## 2020-06-05 ENCOUNTER — Ambulatory Visit (HOSPITAL_COMMUNITY)
Admission: RE | Admit: 2020-06-05 | Discharge: 2020-06-05 | Disposition: A | Discharge status: Home / Self Care | Payer: 59 | Source: Ambulatory Visit | Attending: Nurse Practitioner | Admitting: Nurse Practitioner

## 2020-06-05 VITALS — BP 156/96 | HR 71

## 2020-06-05 DIAGNOSIS — Z79899 Other long term (current) drug therapy: Secondary | ICD-10-CM | POA: Diagnosis not present

## 2020-06-05 DIAGNOSIS — I480 Paroxysmal atrial fibrillation: Secondary | ICD-10-CM | POA: Diagnosis not present

## 2020-06-05 MED ORDER — DOFETILIDE 500 MCG PO CAPS: 500.0000 ug | ORAL_CAPSULE | Freq: Two times a day (BID) | ORAL | 12 refills | Status: DC

## 2020-06-05 NOTE — Patient Instructions (Signed)
[facility], [place] Call- [phone]- Contact the pharmacy regarding 90 day supply at least 2 weeks in advance if you want to do the 90 day supple- which is around $55.00 with the Good Rx card

## 2020-06-05 NOTE — Telephone Encounter (Signed)
Patient called in and states he has missed 2 doses of his Tikosyn medication. He thought he had several doses left and ended up over the weekend running out of his medication. Per Ceasar Lund- advise patient to come to the clinic to pick up samples of Tikosyn medication. He will take this dose once he gets here and come back today at 1:30pm for an EKG. Consulted with patient and he verbalized understanding.

## 2020-06-05 NOTE — Progress Notes (Signed)
Pt in for EKG after he ran out of Tikosyn yesterday and was not able to get the 90 day supply as he was limited on funds. We gave him one tikosyn 500 mcg at 10 am and he returned at 1:30 today for ekg. His ekg looks stable at 71 bpm, pr int 182 ms, qrs int 96 ms, qtc 432 ms.   We have called in rx for dofetilide 500 mcg bid #60 tablets,  to [facility] at [place]  which should run $28 a month. He will pick up now to have his PM dose.

## 2020-06-08 ENCOUNTER — Encounter (HOSPITAL_COMMUNITY): Payer: 59 | Admitting: Nurse Practitioner

## 2020-06-12 ENCOUNTER — Ambulatory Visit (INDEPENDENT_AMBULATORY_CARE_PROVIDER_SITE_OTHER): Payer: 59 | Admitting: Student in an Organized Health Care Education/Training Program

## 2020-06-12 DIAGNOSIS — Z5329 Procedure and treatment not carried out because of patient's decision for other reasons: Secondary | ICD-10-CM

## 2020-06-13 NOTE — Progress Notes (Signed)
   Complete physical exam  Patient: [name]   DOB: 05/25/2001   54 y.o. Male  MRN: [id number]  Subjective:    No chief complaint on file.   [name] is a 54 y.o. male who presents today for a complete physical exam. She reports consuming a {diet types:17450} diet. {types:19826} She generally feels {DESC; WELL/FAIRLY WELL/POORLY:18703}. She reports sleeping {DESC; WELL/FAIRLY WELL/POORLY:18703}. She {does/does not:200015} have additional problems to discuss today.    Most recent fall risk assessment:    01/30/2022   10:42 AM  Fall Risk   Falls in the past year? 0  Number falls in past yr: 0  Injury with Fall? 0  Risk for fall due to : No Fall Risks  Follow up Falls evaluation completed     Most recent depression screenings:    01/30/2022   10:42 AM 12/21/2020   10:46 AM  PHQ 2/9 Scores  PHQ - 2 Score 0 0  PHQ- 9 Score 5     {VISON DENTAL STD PSA (Optional):27386}  {History (Optional):23778}  Patient Care Team: Jessup, Joy, NP as PCP - General (Nurse Practitioner)   Outpatient Medications Prior to Visit  Medication Sig   fluticasone (FLONASE) 50 MCG/ACT nasal spray Place 2 sprays into both nostrils in the morning and at bedtime. After 7 days, reduce to once daily.   norgestimate-ethinyl estradiol (SPRINTEC 28) 0.25-35 MG-MCG tablet Take 1 tablet by mouth daily.   Nystatin POWD Apply liberally to affected area 2 times per day   spironolactone (ALDACTONE) 100 MG tablet Take 1 tablet (100 mg total) by mouth daily.   No facility-administered medications prior to visit.    ROS        Objective:     There were no vitals taken for this visit. {Vitals History (Optional):23777}  Physical Exam   No results found for any visits on 03/07/22. {Show previous labs (optional):23779}    Assessment & Plan:    Routine Health Maintenance and Physical Exam  Immunization History  Administered Date(s) Administered   DTaP 08/08/1999, 10/04/1999,  12/13/1999, 08/28/2000, 03/13/2004   Hepatitis A 01/08/2008, 01/13/2009   Hepatitis B 05/26/1999, 07/03/1999, 12/13/1999   HiB (PRP-OMP) 08/08/1999, 10/04/1999, 12/13/1999, 08/28/2000   IPV 08/08/1999, 10/04/1999, 06/02/2000, 03/13/2004   Influenza,inj,Quad PF,6+ Mos 04/15/2014   Influenza-Unspecified 07/15/2012   MMR 06/02/2001, 03/13/2004   Meningococcal Polysaccharide 01/13/2012   Pneumococcal Conjugate-13 08/28/2000   Pneumococcal-Unspecified 12/13/1999, 02/26/2000   Tdap 01/13/2012   Varicella 06/02/2000, 01/08/2008    Health Maintenance  Topic Date Due   HIV Screening  Never done   Hepatitis C Screening  Never done   INFLUENZA VACCINE  03/05/2022   PAP-Cervical Cytology Screening  03/07/2022 (Originally 05/24/2020)   PAP SMEAR-Modifier  03/07/2022 (Originally 05/24/2020)   TETANUS/TDAP  03/07/2022 (Originally 01/12/2022)   HPV VACCINES  Discontinued   COVID-19 Vaccine  Discontinued    Discussed health benefits of physical activity, and encouraged her to engage in regular exercise appropriate for her age and condition.  Problem List Items Addressed This Visit   None Visit Diagnoses     Annual physical exam    -  Primary   Cervical cancer screening       Need for Tdap vaccination          No follow-ups on file.     Joy Jessup, NP   

## 2020-06-28 ENCOUNTER — Ambulatory Visit: Payer: 59 | Admitting: Family Medicine

## 2020-07-03 ENCOUNTER — Ambulatory Visit: Payer: 59 | Admitting: Student in an Organized Health Care Education/Training Program

## 2020-08-01 ENCOUNTER — Encounter: Payer: Self-pay | Admitting: Family Medicine

## 2020-08-01 DIAGNOSIS — Z1211 Encounter for screening for malignant neoplasm of colon: Secondary | ICD-10-CM

## 2020-08-14 ENCOUNTER — Encounter: Payer: Self-pay | Admitting: Family Medicine

## 2020-08-14 NOTE — Patient Instructions (Addendum)
Thank you for coming to see me today. It was a pleasure.   Dishydrotic eczema Start Clobetasol ointment twice a day.   Continue to moisturize your hands daily.   Start Tylenol 1300 mg every 8 hours for the next month  Please follow-up with PCP in 4 weeks or sooner if needed   If you have any questions or concerns, please do not hesitate to call the office at [phone].  Best,   Carollee Leitz, MD

## 2020-08-14 NOTE — Progress Notes (Signed)
    SUBJECTIVE:   CHIEF COMPLAINT / HPI: rash on hands and right wrist pain  Bilateral rash Reports rash on palms of hands. Started as small blisters that erupted and caused skin to peal. Using Triamcinolone with some relief. Rash is itchy and localized to bilateral palmer surfaces.  Denies any fevers, similar areas elsewhere on body.  No recent changes in environment, laundry detergents, lotions or soaps.  Left forearm pain Reports pain along lateral side of left wrist. Endorses repetitive movement at work which may have attributed.  He had splints but they are too small.  Denies any fevers, weakness, numbness or tingling.  PERTINENT  PMH / PSH:  Afib on Eliquis Gout  OBJECTIVE:   BP (!) 142/80   Pulse 65   Ht 6\' 2"  (1.88 m)   Wt (!) 324 lb 9.6 oz (147.2 kg)   SpO2 98%   BMI 41.68 kg/m    General: Alert, no acute distress Bilateral hands:  Small fluid filled blisters and scaling noted to palmer surfaces of hands. No erythema,drainage or edema appreciated Left forearm: tenderness along the lateral aspect above snuff box area.  No erythema or edema appreciated. ROM wnl.Sensation wnl   ASSESSMENT/PLAN:   Dyshydrosis Clobetasol ointment BID  Aquaphor ointment apply as needed Follow up wit PCP if symptoms worsen   Tendonitis of wrist, left Tylenol 1300 mg TID x 14/7 Wrist splints while working and at night Voltaren gel qid as needed Can consider SM referral for injection if no improvement of symptoms Consider uric acid at next visit if no improvement Follow up with PCP as needed     Carollee Leitz, MD [place]

## 2020-08-15 ENCOUNTER — Other Ambulatory Visit: Payer: Self-pay

## 2020-08-15 ENCOUNTER — Ambulatory Visit: Payer: 59 | Admitting: Family Medicine

## 2020-08-15 DIAGNOSIS — L301 Dyshidrosis [pompholyx]: Secondary | ICD-10-CM | POA: Diagnosis not present

## 2020-08-15 DIAGNOSIS — M778 Other enthesopathies, not elsewhere classified: Secondary | ICD-10-CM

## 2020-08-15 MED ORDER — CLOBETASOL PROPIONATE 0.05 % EX OINT: 1.0000 "application " | TOPICAL_OINTMENT | Freq: Two times a day (BID) | CUTANEOUS | 0 refills | Status: DC

## 2020-08-15 MED ORDER — AQUAPHOR EX OINT: TOPICAL_OINTMENT | CUTANEOUS | 0 refills | Status: DC | PRN

## 2020-08-15 MED ORDER — ACETAMINOPHEN ER 650 MG PO TBCR: 1300.0000 mg | EXTENDED_RELEASE_TABLET | Freq: Three times a day (TID) | ORAL | 0 refills | Status: AC

## 2020-08-19 ENCOUNTER — Encounter: Payer: Self-pay | Admitting: Family Medicine

## 2020-08-19 DIAGNOSIS — M778 Other enthesopathies, not elsewhere classified: Secondary | ICD-10-CM | POA: Insufficient documentation

## 2020-08-19 DIAGNOSIS — L301 Dyshidrosis [pompholyx]: Secondary | ICD-10-CM | POA: Insufficient documentation

## 2020-08-19 NOTE — Assessment & Plan Note (Addendum)
Tylenol 1300 mg TID x 14/7 Wrist splints while working and at night Voltaren gel qid as needed Can consider SM referral for injection if no improvement of symptoms Consider uric acid at next visit if no improvement Follow up with PCP as needed

## 2020-08-19 NOTE — Assessment & Plan Note (Signed)
Clobetasol ointment BID  Aquaphor ointment apply as needed Follow up wit PCP if symptoms worsen

## 2020-09-03 ENCOUNTER — Other Ambulatory Visit: Payer: Self-pay | Admitting: Family Medicine

## 2020-09-04 MED ORDER — OMEPRAZOLE 20 MG PO CPDR: 20.0000 mg | DELAYED_RELEASE_CAPSULE | Freq: Two times a day (BID) | ORAL | 0 refills | Status: DC

## 2020-09-04 MED ORDER — MITIGARE 0.6 MG PO CAPS: 1.0000 | ORAL_CAPSULE | Freq: Every day | ORAL | 0 refills | Status: DC

## 2020-09-05 ENCOUNTER — Encounter: Payer: Self-pay | Admitting: Family Medicine

## 2020-09-05 ENCOUNTER — Telehealth (INDEPENDENT_AMBULATORY_CARE_PROVIDER_SITE_OTHER): Payer: 59 | Admitting: Family Medicine

## 2020-09-05 DIAGNOSIS — M545 Low back pain, unspecified: Secondary | ICD-10-CM | POA: Insufficient documentation

## 2020-09-05 DIAGNOSIS — M5386 Other specified dorsopathies, lumbar region: Secondary | ICD-10-CM

## 2020-09-05 NOTE — Addendum Note (Signed)
Addended by: Bridget Hartshorn on: 09/05/2020 03:12 PM   Modules accepted: Level of Service

## 2020-09-05 NOTE — Assessment & Plan Note (Signed)
Patient with history of chronic back pain presenting with recent worsening of sciatica symptoms on left side in the last week. He also has urinary incontinence, which he reports is a chronic problem, but recently worse in the same time frame. Per report on video visit, he can stand on the balls of his toes, appears to have normal strength by description. He does not have fecal incontinence, is not sure if he has saddle anesthesia. Because of worsening symptoms, patient should be evaluated in person quickly. However, his incontinence sounds chronic by report, although still concern for disc herniation. Precepted with Dr. Erin Hearing, first available appointment at [facility] tomorrow at 9 AM scheduled. Patient will likely need imaging (MRI) to r/o herniation, consider steroids to treat acute inflammation. Recommend obtaining A1c as patient has BMI >40, lasst A1c in 2020 was prediabetic at 5.8%.

## 2020-09-05 NOTE — Progress Notes (Signed)
[place] Telemedicine Visit  Patient consented to have virtual visit and was identified by name and date of birth. Method of visit: Video  Encounter participants: Patient: [name] - located at home Provider: Gladys Damme - located at home  Chief Complaint: sharp back pain  HPI:  [name] is a 55 yo man presenting today with complaint of back pain. He has a long history of back pain and completed PT for lumbar DJD in 2020. He reports that in the last week he has had increasing sharp pain, with tingling/numbness radiating down to the left foot. He believes this was made worse since he was carrying something on ice last week, he did not have a fall, but believes he tweaked a muscle. He reports urinary incontinence, which he has had before, but is worse now. Urinary incontinence is small volume. He denies fecal incontinence. He is able to stand on the balls of his feet. He is not sure if he has saddle anesthesia or not.  He notes that he has tried tylenol without relief, heating pads with minimal relief. Reports that lying on the right side feels better. 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  ROS: per HPI  Pertinent PMHx: h/o lumbar DJD  Exam:  There were no vitals taken for this visit.  Respiratory: speaking in full sentences  Assessment/Plan:  Sciatica of left side associated with disorder of lumbar spine Patient with history of chronic back pain presenting with recent worsening of sciatica symptoms on left side in the last week. He also has urinary incontinence, which he reports is a chronic problem, but recently worse in the same time frame. Per report on video visit, he can stand on the balls of his toes, appears to have normal strength by description. He does not have fecal incontinence, is not sure if he has saddle anesthesia. Because of worsening symptoms, patient should be evaluated in person quickly. However,  his incontinence sounds chronic by report, although still concern for disc herniation. Precepted with Dr. Erin Hearing, first available appointment at [facility] tomorrow at 9 AM scheduled. Patient will likely need imaging (MRI) to r/o herniation, consider steroids to treat acute inflammation. Recommend obtaining A1c as patient has BMI >40, lasst A1c in 2020 was prediabetic at 5.8%.    Time spent during visit with patient: 22 minutes

## 2020-09-06 ENCOUNTER — Other Ambulatory Visit: Payer: Self-pay

## 2020-09-06 ENCOUNTER — Ambulatory Visit: Payer: 59 | Admitting: Family Medicine

## 2020-09-06 ENCOUNTER — Ambulatory Visit
Admission: RE | Admit: 2020-09-06 | Discharge: 2020-09-06 | Disposition: A | Discharge status: Home / Self Care | Payer: 59 | Source: Ambulatory Visit | Attending: Family Medicine | Admitting: Family Medicine

## 2020-09-06 VITALS — BP 132/90 | HR 78 | Wt 325.0 lb

## 2020-09-06 DIAGNOSIS — I480 Paroxysmal atrial fibrillation: Secondary | ICD-10-CM | POA: Diagnosis not present

## 2020-09-06 DIAGNOSIS — R7303 Prediabetes: Secondary | ICD-10-CM | POA: Diagnosis not present

## 2020-09-06 DIAGNOSIS — N39498 Other specified urinary incontinence: Secondary | ICD-10-CM

## 2020-09-06 DIAGNOSIS — M545 Low back pain, unspecified: Secondary | ICD-10-CM

## 2020-09-06 DIAGNOSIS — G8929 Other chronic pain: Secondary | ICD-10-CM

## 2020-09-06 LAB — POCT GLYCOSYLATED HEMOGLOBIN (HGB A1C): Hemoglobin A1C: 5.7 % — AB (ref 4.0–5.6)

## 2020-09-06 MED ORDER — DICLOFENAC SODIUM 1 % EX GEL: 2.0000 g | Freq: Four times a day (QID) | CUTANEOUS | 1 refills | Status: DC

## 2020-09-06 NOTE — Progress Notes (Signed)
SUBJECTIVE:   CHIEF COMPLAINT / HPI:   Back pain: Patient has had a worsening of his chronic back pain in the past 4 to 5 days.  He was carrying something just prior to this worsening and thinks that may have triggered it.  He also works on a forklift every day and they have recently switched to newer more compact forklifts with less leg room which she feels like may be contributing as well.  He has sharp pains in his lower back and feels like he is "locking up" after bending over and then standing back up, which last a few seconds.  He takes Tylenol, but cannot take NSAIDs due to being on Eliquis for A. fib.  He has not had any imaging of his spine in the past few years due to delays from the Covid pandemic.  A. fib: Patient felt like he was in A. fib last night.  He can feel his heart fluttering and increased heart rate when he goes into A. fib.  He is on Tikosyn as well as Eliquis.  Is compliant with his medications.  Not in A. fib currently.  Incontinence: Patient has the urge to urinate that occurs especially when he bends over.  He wears pads because sometimes he cannot make it to the bathroom in time if it is far enough away.  He states "not much at all" comes out when he urinates.  No difficulty starting or stopping urine.  Stream is normal other than reduced volume.  No family history of prostate cancer.  No saddle anesthesia.  He does not urinate without feeling the urge that he needs to urinate first.  PERTINENT  PMH / PSH: Back pain, A. fib  OBJECTIVE:   BP 132/90   Pulse 78   Wt (!) 325 lb (147.4 kg)   SpO2 97%   BMI 41.73 kg/m   General: Alert and oriented.  No acute distress.  Tall overweight male, appears stated age. CV: Regular rate and rhythm Pulmonary: Lungs clear auscultation bilaterally MSK: Mildly reduced internal and external hip rotation bilaterally.  Endorses pain in the posterior lateral left hip with these movements.  Bilateral knee crepitus appreciated.   Negative straight and cross leg test.  Right side FABER and FADIR test normal.  Left side FABER test positive for pain in the groin, FADIR test positive for posterior lateral hip pain no tenderness to palpation over the greater trochanter.  No tenderness to palpation along the vertebral column.  Tenderness near the left ischial tuberosity.  ASSESSMENT/PLAN:   Chronic left-sided low back pain without sciatica Differential includes osteoarthritis, piriformis syndrome, SI joint dysfunction, femoroacetabular impingement.  Physical exam negative for vertebral pathology.  We will get x-rays of the lumbar spine and hip.  Advised patient he can use Voltaren gel.  Depending on imaging results will probably refer to sports medicine versus orthopedics.  Would benefit from formal PT as well.  Urinary incontinence Patient describing symptoms of both urge and overflow incontinence, although would favor overflow due to enlarged prostate.  Will check PSA today.  After results come back we will likely start patient on Flomax unless contraindicated.  Advised to follow-up with his PCP regarding these issues.  Atrial fibrillation ([place]) Compliant with medications.  Patient state he was in A. fib last night and it had been a "long time" since last time he felt himself in A. fib.  Not currently in A. fib today.  Denies chest pain or difficulty breathing.  Benay Pike, MD [place]

## 2020-09-06 NOTE — Patient Instructions (Signed)
It was nice to see you today,  For your back pain I am getting an x-ray of your hip and low back.  I think it may be due to arthritis.  I have also prescribed a medicine called Voltaren gel that you can apply to the area up to 4 times a day.  This is also also available over-the-counter as well.  Because of you having the issues with urinary incontinence I will order a blood test to evaluate for prostate cancer, although it is most likely due to something called BPH.  You should follow-up with your PCP, Dr. Ardelia Mems, in the next few weeks to address your chronic issues.

## 2020-09-07 LAB — BASIC METABOLIC PANEL
BUN/Creatinine Ratio: 12 (ref 9–20)
BUN: 14 mg/dL (ref 6–24)
CO2: 21 mmol/L (ref 20–29)
Calcium: 9.6 mg/dL (ref 8.7–10.2)
Chloride: 106 mmol/L (ref 96–106)
Creatinine, Ser: 1.14 mg/dL (ref 0.76–1.27)
GFR calc Af Amer: 83 mL/min/{1.73_m2} (ref >59)
GFR calc non Af Amer: 71 mL/min/{1.73_m2} (ref >59)
Glucose: 111 mg/dL — ABNORMAL HIGH (ref 65–99)
Potassium: 5 mmol/L (ref 3.5–5.2)
Sodium: 143 mmol/L (ref 134–144)

## 2020-09-07 LAB — PSA: Prostate Specific Ag, Serum: 0.7 ng/mL (ref 0.0–4.0)

## 2020-09-08 ENCOUNTER — Other Ambulatory Visit: Payer: Self-pay | Admitting: Family Medicine

## 2020-09-08 DIAGNOSIS — G8929 Other chronic pain: Secondary | ICD-10-CM

## 2020-09-08 DIAGNOSIS — M545 Low back pain, unspecified: Secondary | ICD-10-CM

## 2020-09-08 MED ORDER — TAMSULOSIN HCL 0.4 MG PO CAPS: 0.4000 mg | ORAL_CAPSULE | Freq: Every day | ORAL | 3 refills | Status: DC

## 2020-09-08 NOTE — Assessment & Plan Note (Signed)
Compliant with medications.  Patient state he was in A. fib last night and it had been a "long time" since last time he felt himself in A. fib.  Not currently in A. fib today.  Denies chest pain or difficulty breathing.

## 2020-09-08 NOTE — Assessment & Plan Note (Signed)
Patient describing symptoms of both urge and overflow incontinence, although would favor overflow due to enlarged prostate.  Will check PSA today.  After results come back we will likely start patient on Flomax unless contraindicated.  Advised to follow-up with his PCP regarding these issues.

## 2020-09-08 NOTE — Assessment & Plan Note (Signed)
Differential includes osteoarthritis, piriformis syndrome, SI joint dysfunction, femoroacetabular impingement.  Physical exam negative for vertebral pathology.  We will get x-rays of the lumbar spine and hip.  Advised patient he can use Voltaren gel.  Depending on imaging results will probably refer to sports medicine versus orthopedics.  Would benefit from formal PT as well.

## 2020-09-12 ENCOUNTER — Ambulatory Visit (HOSPITAL_COMMUNITY)
Admission: RE | Admit: 2020-09-12 | Discharge: 2020-09-12 | Disposition: A | Discharge status: Home / Self Care | Payer: 59 | Source: Ambulatory Visit | Attending: Physician Assistant | Admitting: Physician Assistant

## 2020-09-12 ENCOUNTER — Encounter (HOSPITAL_COMMUNITY): Payer: Self-pay | Admitting: Physician Assistant

## 2020-09-12 ENCOUNTER — Other Ambulatory Visit: Payer: Self-pay

## 2020-09-12 VITALS — BP 138/80 | HR 60 | Ht 74.0 in | Wt 323.4 lb

## 2020-09-12 DIAGNOSIS — Z7901 Long term (current) use of anticoagulants: Secondary | ICD-10-CM | POA: Diagnosis not present

## 2020-09-12 DIAGNOSIS — E669 Obesity, unspecified: Secondary | ICD-10-CM | POA: Insufficient documentation

## 2020-09-12 DIAGNOSIS — I480 Paroxysmal atrial fibrillation: Secondary | ICD-10-CM | POA: Diagnosis not present

## 2020-09-12 DIAGNOSIS — I10 Essential (primary) hypertension: Secondary | ICD-10-CM | POA: Insufficient documentation

## 2020-09-12 DIAGNOSIS — Z87891 Personal history of nicotine dependence: Secondary | ICD-10-CM | POA: Diagnosis not present

## 2020-09-12 DIAGNOSIS — Z79899 Other long term (current) drug therapy: Secondary | ICD-10-CM | POA: Diagnosis not present

## 2020-09-12 DIAGNOSIS — G4700 Insomnia, unspecified: Secondary | ICD-10-CM | POA: Diagnosis not present

## 2020-09-12 LAB — MAGNESIUM: Magnesium: 2.2 mg/dL (ref 1.7–2.4)

## 2020-09-12 NOTE — Progress Notes (Signed)
Patient ID: [name], male   DOB: [birth year], 55 y.o.   MRN: [id number]     Primary Care Physician: [name], MD Referring Physician: Dr. Rayann Heman, [facility] f/u Cardiologist: Dr. Leonides Cave is a 55 y.o. male with a h/o PAF on  tikosyn. He feels that mostly he is staying in [place]. He did have afib last night after eating a high salt dinner at [facility]. He is in normal rhythm today. He is being compliant with Tikosyn/ eliquis. No bleeding issues.  He is using cpap. He is back at [employer] on the machines with social distancing in place.   F/u in afib clinic, 10/26/29. He asked to be seen today as he will notice fluttering in his chest but his HR will be lower in the 40's. He did notice one episode of afib with HR's in the 150's x 1. He notices more shortness of breath when he has the fluttering/slow HR spells. No change in health. Eye drop Xiidra, added 2 weeks ago for dry eye. Continues on eliquis 5 mg bid for a CHA2DS2VASc score of 1.   F/u in the afib clinic, 5/3,  for f/u ER visit 4/7,  for chest pain. It was not felt to be cardiac in origin. He was in afib in the ER but converted. The  chest pain  was not associated with afib over the preceding days at home with same. Dr. Burt Knack saw on consult in the ER and cleared to go home. He wore a monitor that showed 7% afib burden. HE feels the chest pain was form stress dealing with his job. 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 He is not an ablation candidate for his weight at 328 lbs today. He is   back on [facility] for his weight loss efforts. He lost 50 lbs one time doing [employer], but went on vacation and got off [place] at that time.   Follow up in the AF clinic 09/12/20. Patient reports that over the last week he felt his was out of rhythm with symptoms of palpitations. There were no other associated symptoms. He has been using Voltaren for his back pain and feels this is what triggered his afib.    Today, he denies symptoms of chest pain, shortness of breath, orthopnea, PND, lower extremity edema, dizziness, presyncope, syncope, or neurologic sequela. Positive for back pain.The patient is tolerating medications without difficulties and is otherwise without complaint today.   Past Medical History:  Diagnosis Date  . Arthritis    "knees" (01/22/2017)  . Chronic lower back pain   . GERD (gastroesophageal reflux disease)   . Gout    "no flares in awhile" (01/22/2017)  . History of MRI of cervical spine 09/25/10   normal; left shoulder normal as well  . Hypertension   . Migraine    "~ q 6 months" (01/22/2017)  . Morbid obesity ([place])    "I've lost 43# in 15 weeks w/weight watchers" (01/22/2017)  . OSA on CPAP   . Paroxysmal atrial fibrillation ([place]) 7/15   chads2vasc score is 1   Past Surgical History:  Procedure Laterality Date  . CARDIAC CATHETERIZATION  05/2005   "no blockage"  . KNEE ARTHROSCOPY Right 02/2013  . LEFT HEART CATH AND CORONARY ANGIOGRAPHY N/A 01/27/2017   Procedure: Left Heart Cath and Coronary Angiography;  Surgeon: Troy Sine, MD;  Location: [facility];  Service:  Cardiovascular;  Laterality: N/A;    Current Outpatient Medications  Medication Sig Dispense Refill  . acetaminophen (TYLENOL 8 HOUR) 650 MG CR tablet Take 2 tablets (1,300 mg total) by mouth every 8 (eight) hours. 180 tablet 0  . atorvastatin (LIPITOR) 20 MG tablet Take 1 tablet by mouth once daily 90 tablet 3  . cetirizine (ZYRTEC) 10 MG tablet Take 1 tablet (10 mg total) by mouth daily. 30 tablet 11  . clobetasol ointment (TEMOVATE) 2.58 % Apply 1 application topically 2 (two) times daily. 30 g 0  . diclofenac Sodium (VOLTAREN) 1 % GEL Apply 2 g topically 4 (four) times daily. 150 g 1  . diltiazem (CARDIZEM) 30 MG tablet Take 1 tablet every 4 hours AS NEEDED for elevated heart rate 30 tablet 1  . dofetilide (TIKOSYN) 500 MCG capsule Take 1 capsule (500 mcg total) by mouth 2 (two) times  daily. 60 capsule 12  . ELIQUIS 5 MG TABS tablet Take 1 tablet by mouth twice daily 180 tablet 3  . fluticasone (FLONASE) 50 MCG/ACT nasal spray Place 2 sprays into both nostrils daily. 16 g 6  . MITIGARE 0.6 MG CAPS Take 1 capsule by mouth daily. 90 capsule 0  . Multiple Vitamin (MULTIVITAMIN WITH MINERALS) TABS tablet Take 1 tablet by mouth at bedtime.    . Omega-3 Fatty Acids (FISH OIL) 1000 MG CAPS Take 2,000 mg by mouth in the morning.     Marland Kitchen omeprazole (PRILOSEC) 20 MG capsule Take 1 capsule (20 mg total) by mouth 2 (two) times daily before a meal. 180 capsule 0  . RESTASIS 0.05 % ophthalmic emulsion Place 1 drop into both eyes 2 (two) times daily.     . tamsulosin (FLOMAX) 0.4 MG CAPS capsule Take 1 capsule (0.4 mg total) by mouth daily. 30 capsule 3  . TURMERIC PO Take 1 capsule by mouth every other day.     Marland Kitchen XIIDRA 5 % SOLN Place 1 drop into both eyes in the morning and at bedtime.      No current facility-administered medications for this encounter.    Allergies  Allergen Reactions  . Hydrochlorothiazide-Propranolol [Propranolol-Hctz] Other (See Comments)    Joints lock up  . Penicillins Other (See Comments)    Hives Makes joints stiff and unable to move Has patient had a PCN reaction causing immediate rash, facial/tongue/throat swelling, SOB or lightheadedness with hypotension: No Has patient had a PCN reaction causing severe rash involving mucus membranes or skin necrosis: No Has patient had a PCN reaction that required hospitalization No Has patient had a PCN reaction occurring within the last 10 years: No If all of the above answers are "NO", then may proceed with Cephalosporin use.    Social History   Socioeconomic History  . Marital status: Married    Spouse name: Not on file  . Number of children: Not on file  . Years of education: Not on file  . Highest education level: Not on file  Occupational History  . Not on file  Tobacco Use  . Smoking status: Former  Smoker    Packs/day: 1.00    Years: 10.00    Pack years: 10.00    Types: Cigarettes    Quit date: 04/02/1994    Years since quitting: 26.4  . Smokeless tobacco: Former Systems developer    Types: Chew  . Tobacco comment: "chewed in my teens"  Vaping Use  . Vaping Use: Never used  Substance and Sexual Activity  . Alcohol use: No  Alcohol/week: 0.0 standard drinks    Comment: "stopped in 1994"  . Drug use: Not Currently    Types: Marijuana    Comment: "stopped marijuna in 1994"  . Sexual activity: Yes  Other Topics Concern  . Not on file  Social History Narrative   Lives in [place] with spouse.  3 children, all grown.   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 Atrial fibrillation Mother 60       had a stroke  . Hypertension Father   . Diabetes Father     ROS- All systems are reviewed and negative except as per the HPI above  Physical Exam: Vitals:   09/12/20 1502  BP: 138/80  Pulse: 60  Weight: (!) 146.7 kg  Height: 6\' 2"  (1.88 m)    GEN- The patient is well appearing obese male, alert and oriented x 3 today.   HEENT-head normocephalic, atraumatic, sclera clear, conjunctiva pink, hearing intact, trachea midline. Lungs- Clear to ausculation bilaterally, normal work of breathing Heart- Regular rate and rhythm, no murmurs, rubs or gallops  GI- soft, NT, ND, + BS Extremities- no clubbing, cyanosis, or edema MS- no significant deformity or atrophy Skin- no rash or lesion Psych- euthymic mood, full affect Neuro- strength and sensation are intact   EKG- SR HR 60, PR 202, QRS 98, QTc 428  Epic records reviewed  Assessment and  Plan: 1. Paroxysmal afib Patient in [place]. Unlikely related to Voltaren use.  Continue dofetilide 500 mg bid, QTc stable. Check mag today, recent labs reviewed.  Continue Eliquis 5 mg BID Continue diltiazem 30 mg PRN q 4 hours for heart racing  2. Obesity Lifestyle modification was discussed and encouraged including regular physical activity and weight reduction. Patient is going to the gym more recently.   3. HTN Stable, no changes today.  4.  CHA2DS2VASc score of 1 Continue  eliquis 5 mg bid   5. OSA The importance of adequate treatment of sleep apnea was discussed today in order to improve our ability to maintain sinus rhythm long term. Encouraged compliance with CPAP therapy.   Follow up with Roderic Palau in 3 months.    [facility] [address] [place], [place] [address] [phone]

## 2020-09-20 ENCOUNTER — Encounter: Payer: Self-pay | Admitting: Family Medicine

## 2020-09-20 ENCOUNTER — Telehealth (HOSPITAL_COMMUNITY): Payer: Self-pay | Admitting: *Deleted

## 2020-09-20 DIAGNOSIS — M778 Other enthesopathies, not elsewhere classified: Secondary | ICD-10-CM

## 2020-09-20 DIAGNOSIS — I480 Paroxysmal atrial fibrillation: Secondary | ICD-10-CM

## 2020-09-20 DIAGNOSIS — I4819 Other persistent atrial fibrillation: Secondary | ICD-10-CM

## 2020-09-20 NOTE — Telephone Encounter (Signed)
Patient called in stating he is having fluttering daily with any exertion at work or home. Pt was here 2/8 with similar complaints but presented in [place]. Pt has worn multiple heart monitors for similar complaints which were unrevealing. Pt can use PRN cardizem for elevated HRs.  Discussed with Roderic Palau NP states pt should see Dr. Rayann Heman to discuss possible linq placement to further characterize what he is feeling. Pt in agreement and will await their call.

## 2020-09-21 ENCOUNTER — Other Ambulatory Visit: Payer: Self-pay

## 2020-09-21 ENCOUNTER — Ambulatory Visit (HOSPITAL_COMMUNITY)
Admission: RE | Admit: 2020-09-21 | Discharge: 2020-09-21 | Disposition: A | Discharge status: Home / Self Care | Payer: 59 | Source: Ambulatory Visit | Attending: Physician Assistant | Admitting: Physician Assistant

## 2020-09-21 VITALS — BP 136/92 | HR 110

## 2020-09-21 DIAGNOSIS — I4891 Unspecified atrial fibrillation: Secondary | ICD-10-CM | POA: Diagnosis not present

## 2020-09-21 DIAGNOSIS — I4819 Other persistent atrial fibrillation: Secondary | ICD-10-CM

## 2020-09-21 MED ORDER — METOPROLOL SUCCINATE ER 25 MG PO TB24: 12.5000 mg | ORAL_TABLET | Freq: Every day | ORAL | 3 refills | Status: DC

## 2020-09-21 NOTE — Progress Notes (Signed)
Patient returns for ECG today. ECG shows afib HR 110, QRS 90, QTc 397. He feels he is going in and out of rhythm. Will start low dose Toprol 12.5 mg daily and follow up in one week with Roderic Palau.

## 2020-09-21 NOTE — Patient Instructions (Signed)
Start metoprolol 1/2 tablet once a day (12.5mg )

## 2020-09-28 ENCOUNTER — Ambulatory Visit (HOSPITAL_COMMUNITY): Payer: 59 | Admitting: Nurse Practitioner

## 2020-10-09 ENCOUNTER — Ambulatory Visit: Payer: 59 | Admitting: Internal Medicine

## 2020-10-09 ENCOUNTER — Other Ambulatory Visit: Payer: Self-pay

## 2020-10-09 ENCOUNTER — Ambulatory Visit: Payer: 59 | Admitting: Sports Medicine

## 2020-10-09 ENCOUNTER — Encounter: Payer: Self-pay | Admitting: Internal Medicine

## 2020-10-09 VITALS — BP 146/86 | HR 59 | Ht 74.0 in | Wt 326.4 lb

## 2020-10-09 VITALS — BP 150/82 | Ht 74.0 in | Wt 317.0 lb

## 2020-10-09 DIAGNOSIS — R072 Precordial pain: Secondary | ICD-10-CM | POA: Diagnosis not present

## 2020-10-09 DIAGNOSIS — M778 Other enthesopathies, not elsewhere classified: Secondary | ICD-10-CM

## 2020-10-09 DIAGNOSIS — I10 Essential (primary) hypertension: Secondary | ICD-10-CM

## 2020-10-09 DIAGNOSIS — R079 Chest pain, unspecified: Secondary | ICD-10-CM

## 2020-10-09 DIAGNOSIS — I480 Paroxysmal atrial fibrillation: Secondary | ICD-10-CM | POA: Diagnosis not present

## 2020-10-09 DIAGNOSIS — G4733 Obstructive sleep apnea (adult) (pediatric): Secondary | ICD-10-CM

## 2020-10-09 NOTE — Progress Notes (Signed)
Electrophysiology Office Note   Date:  10/09/2020   ID:  [name], DOB May 11, 1966, MRN [id number]  PCP:  Wayne Rio, MD  Cardiologist:  Dr Acie Fredrickson Primary Electrophysiologist: Wayne Grayer, MD    CC: afib   History of Present Illness: [name] is a 55 y.o. male who presents today for electrophysiology evaluation.   He is referred by Dr Acie Fredrickson and the AF clinic for EP consultation regarding atrial arrhythmias.  I saw him last in 2017.  He has done very well with tikosyn.  He has made a little progress with lifestyle modification but not much. He reports that his AF has recently worsened. His primary concern today is with exertional chest pain.  This occurs regardless of afib or sinus rhythm.  He has not yet discussed with Dr Acie Fredrickson.  Today, he denies symptoms of palpitations,   shortness of breath, orthopnea, PND, lower extremity edema,   dizziness, presyncope, syncope, bleeding, or neurologic sequela. The patient is tolerating medications without difficulties and is otherwise without complaint today.    Past Medical History:  Diagnosis Date   Arthritis    "knees" (01/22/2017)   Chronic lower back pain    GERD (gastroesophageal reflux disease)    Gout    "no flares in awhile" (01/22/2017)   History of MRI of cervical spine 09/25/10   normal; left shoulder normal as well   Hypertension    Migraine    "~ q 6 months" (01/22/2017)   Morbid obesity ([place])    "I've lost 43# in 15 weeks w/weight watchers" (01/22/2017)   OSA on CPAP    Paroxysmal atrial fibrillation ([place]) 7/15   chads2vasc score is 1   Past Surgical History:  Procedure Laterality Date   CARDIAC CATHETERIZATION  05/2005   "no blockage"   KNEE ARTHROSCOPY Right 02/2013   LEFT HEART CATH AND CORONARY ANGIOGRAPHY N/A 01/27/2017   Procedure: Left Heart Cath and Coronary Angiography;  Surgeon: Wayne Sine, MD;  Location: [facility];  Service: Cardiovascular;  Laterality: N/A;      Current Outpatient Medications  Medication Sig Dispense Refill   atorvastatin (LIPITOR) 20 MG tablet Take 1 tablet by mouth once daily 90 tablet 3   cetirizine (ZYRTEC) 10 MG tablet Take 1 tablet (10 mg total) by mouth daily. 30 tablet 11   clobetasol ointment (TEMOVATE) 4.48 % Apply 1 application topically 2 (two) times daily. 30 g 0   diclofenac Sodium (VOLTAREN) 1 % GEL Apply 2 g topically 4 (four) times daily. 150 g 1   diltiazem (CARDIZEM) 30 MG tablet Take 1 tablet every 4 hours AS NEEDED for elevated heart rate 30 tablet 1   dofetilide (TIKOSYN) 500 MCG capsule Take 1 capsule (500 mcg total) by mouth 2 (two) times daily. 60 capsule 12   ELIQUIS 5 MG TABS tablet Take 1 tablet by mouth twice daily 180 tablet 3   fluticasone (FLONASE) 50 MCG/ACT nasal spray Place 2 sprays into both nostrils daily. 16 g 6   metoprolol succinate (TOPROL XL) 25 MG 24 hr tablet Take 0.5 tablets (12.5 mg total) by mouth daily. 30 tablet 3   MITIGARE 0.6 MG CAPS Take 1 capsule by mouth daily. 90 capsule 0   Multiple Vitamin (MULTIVITAMIN WITH MINERALS) TABS tablet Take 1 tablet by mouth at bedtime.     Omega-3 Fatty Acids (FISH OIL) 1000 MG CAPS Take 2,000 mg by mouth in the morning.      omeprazole (PRILOSEC) 20 MG  capsule Take 1 capsule (20 mg total) by mouth 2 (two) times daily before a meal. 180 capsule 0   RESTASIS 0.05 % ophthalmic emulsion Place 1 drop into both eyes 2 (two) times daily.      tamsulosin (FLOMAX) 0.4 MG CAPS capsule Take 1 capsule (0.4 mg total) by mouth daily. 30 capsule 3   TURMERIC PO Take 1 capsule by mouth every other day.      XIIDRA 5 % SOLN Place 1 drop into both eyes in the morning and at bedtime.      No current facility-administered medications for this visit.    Allergies:   Hydrochlorothiazide-propranolol [propranolol-hctz] and Penicillins   Social History:  The patient  reports that he quit smoking about 26 years ago. His smoking use included  cigarettes. He has a 10.00 pack-year smoking history. He has quit using smokeless tobacco.  His smokeless tobacco use included chew. He reports previous drug use. Drug: Marijuana. He reports that he does not drink alcohol.   Family History:  The patient's family history includes Atrial fibrillation (age of onset: 68) in his mother; Diabetes in his father; Hypertension in his father.    ROS:  Please see the history of present illness.   All other systems are personally reviewed and negative.    PHYSICAL EXAM: VS:  BP (!) 146/86    Pulse (!) 59    Ht 6\' 2"  (1.88 m)    Wt (!) 326 lb 6.4 oz (148.1 kg)    SpO2 94%    BMI 41.91 kg/m  , BMI Body mass index is 41.91 kg/m. GEN: Well nourished, well developed, in no acute distress HEENT: normal Neck: no JVD  Cardiac: RRR  Respiratory:  normal work of breathing GI: soft  MS: no deformity or atrophy Skin: warm and dry   Neuro:  Strength and sensation are intact Psych: euthymic mood, full affect  EKG:  EKG is ordered today. The ekg ordered today is personally reviewed and shows sinus rhythm 59 bpm, PR 210 msec, QRS 100 msec, Qtc 409 msec   Recent Labs: 05/09/2020: Hemoglobin 13.3; Platelets 238 09/06/2020: BUN 14; Creatinine, Ser 1.14; Potassium 5.0; Sodium 143 09/12/2020: Magnesium 2.2  personally reviewed   Lipid Panel     Component Value Date/Time   CHOL 136 01/24/2020 1715   TRIG 201 (H) 01/24/2020 1715   HDL 30 (L) 01/24/2020 1715   CHOLHDL 4.5 01/24/2020 1715   CHOLHDL 7.2 01/22/2017 2320   VLDL 46 (H) 01/22/2017 2320   LDLCALC 72 01/24/2020 1715   personally reviewed   Wt Readings from Last 3 Encounters:  10/09/20 (!) 326 lb 6.4 oz (148.1 kg)  10/09/20 (!) 317 lb (143.8 kg)  09/12/20 (!) 323 lb 6.4 oz (146.7 kg)      Other studies personally reviewed: Additional studies/ records that were reviewed today include: AF clinic notes, Dr [name] prior note, prior echo and cath  Review of the above records today demonstrates: as  above   ASSESSMENT AND PLAN:  1.  Exertional chest pain Both typical and atypical features Prior stress testing was abnormal.   Cath subsequently 01/27/2017 showed normal cors.  Giving symptoms, I think further CV testing is warranted. Will order cardiac CT Follow-up with Dr Acie Fredrickson afterward.  2. Paroxysmal atrial fibrillation The patient has symptomatic, recurrent atrial fibrillation. he has failed medical therapy with tikosyn. Chads2vasc score is 1.  he is anticoagulated with eliquis . Ultimately, I think that ablation may be his best next  option.  I do worry about ongoing exertional chest pain however.  I will have him follow-up with Dr Acie Fredrickson for workup (as above).  Once CAD is excluded, we will have further discussions regarding ablation.  3. Morbid obesity Body mass index is 41.91 kg/m. Lifestyle modification is advised  4. HTN Stable No change required today  5. OSA Compliance with CPAP as needed  Risks, benefits and potential toxicities for medications prescribed and/or refilled reviewed with patient today.    Follow-up:  Dr Acie Fredrickson as above I will see in 3 months for further consideration of ablation    Signed, Wayne Grayer, MD  10/09/2020 12:45 PM     [facility] [address] [place] [address] [phone] (office) [phone] (fax)

## 2020-10-09 NOTE — Progress Notes (Signed)
   Subjective:    Patient ID: [name], male    DOB: 23-May-1966, 55 y.o.   MRN: [id number]  HPI chief complaint: Left wrist pain  Very pleasant 55 year old male comes in today complaining of approximately 1 month of volar left wrist pain.  No trauma that he can recall.  He has self treated with an off-the-shelf brace with minimal improvement.  He cannot take NSAIDs due to his Eliquis so he has tried Tylenol as well as Voltaren gel.  He denies pain elsewhere in the wrist.  He is right-hand dominant.  Past medical history reviewed Medications reviewed Allergies reviewed    Review of Systems As above    Objective:   Physical Exam  Well-developed, well-nourished.  No acute distress  Left wrist: Full range of motion.  No obvious swelling.  No ecchymosis.  He is tender to palpation along the course of the flexor carpi radialis tendon.  Pain with resisted wrist flexion.  No other bony or soft tissue tenderness to palpation around the wrist.  No tenderness in the anatomic snuffbox.  Good pulses.  Limited MSK ultrasound of the left wrist does show swelling around the flexor carpi radialis consistent with tendinitis      Assessment & Plan:  Left wrist pain secondary to flexor carpi radialis tendinitis  Patient will be fitted with a thumb spica brace with instructions to wear it continuously except for sleeping for the next 3 weeks.  I encouraged him to continue with his Voltaren gel and also to utilize icing as needed.  Follow-up with me again in 3 weeks for reevaluation.  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  I did discuss the possibility of removing him from work for a short period of time to allow his wrist to rest but he would like to avoid this if possible.

## 2020-10-09 NOTE — Patient Instructions (Addendum)
Medication Instructions:  Your physician recommends that you continue on your current medications as directed. Please refer to the Current Medication list given to you today.  Labwork: You will need blood work when your cardiac CT scheduled..  Testing/Procedures: Your physician has requested that you have cardiac CT. Cardiac computed tomography (CT) is a painless test that uses an x-ray machine to take clear, detailed pictures of your heart.   You will be scheduled for a cardiac CT.   Follow-Up:  Your physician wants you to follow-up in: with Dr. Acie Fredrickson after your cardiac CT.     Your physician wants you to follow-up in: 3 months with Dr. Rayann Heman.     Any Other Special Instructions Will Be Listed Below (If Applicable).  SEE INSTRUCTION LETTER FOR CARDIAC CT INSTRUCTIONS.  If you need a refill on your cardiac medications before your next appointment, please call your pharmacy.

## 2020-10-11 ENCOUNTER — Other Ambulatory Visit: Payer: Self-pay

## 2020-10-16 ENCOUNTER — Telehealth: Payer: Self-pay

## 2020-10-16 NOTE — Telephone Encounter (Signed)
Cardiac CT scheduled for 3/21.  RN called to arrange follow-up appointment with Dr.Nahser as requested by Dr.Allred. No answer at this time. VM left to return call.

## 2020-10-16 NOTE — Telephone Encounter (Signed)
-----   Message from Damian Leavell, RN sent at 10/09/2020  2:06 PM EST ----- Regarding: cardiac CT with FFR Hello all,  Dr. Rayann Heman ordered a cardiac CT with FFR for this Pt today for chest pain.   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  Dr. Rayann Heman wants this Pt to see Dr. Acie Fredrickson after his cardiac CT and prior to his next appointment with Dr. Rayann Heman.  [place]-Dr. Rayann Heman wanted a 3 month f/u-but needs cardiac CT and appt with DR. Nahser first, so that may need to be a little further out?  Tina-instruction letter is done and in [place].  I ordered BMP but did not schedule because I'm not sure how far out these tests are.  Thanks  Sonia Baller

## 2020-10-17 ENCOUNTER — Other Ambulatory Visit: Payer: 59 | Admitting: *Deleted

## 2020-10-17 ENCOUNTER — Other Ambulatory Visit: Payer: Self-pay

## 2020-10-17 DIAGNOSIS — R072 Precordial pain: Secondary | ICD-10-CM

## 2020-10-17 DIAGNOSIS — R079 Chest pain, unspecified: Secondary | ICD-10-CM

## 2020-10-18 LAB — BASIC METABOLIC PANEL
BUN/Creatinine Ratio: 9 (ref 9–20)
BUN: 9 mg/dL (ref 6–24)
CO2: 19 mmol/L — ABNORMAL LOW (ref 20–29)
Calcium: 9.2 mg/dL (ref 8.7–10.2)
Chloride: 106 mmol/L (ref 96–106)
Creatinine, Ser: 1.01 mg/dL (ref 0.76–1.27)
Glucose: 124 mg/dL — ABNORMAL HIGH (ref 65–99)
Potassium: 3.7 mmol/L (ref 3.5–5.2)
Sodium: 144 mmol/L (ref 134–144)
eGFR: 87 mL/min/{1.73_m2} (ref >59)

## 2020-10-18 NOTE — Telephone Encounter (Signed)
Follow-up appointment scheduled 4/18 at 3:40pm by Gracy Bruins.

## 2020-10-20 ENCOUNTER — Telehealth (HOSPITAL_COMMUNITY): Payer: Self-pay | Admitting: Emergency Medicine

## 2020-10-20 NOTE — Telephone Encounter (Signed)
Reaching out to patient to offer assistance regarding upcoming cardiac imaging study; pt verbalizes understanding of appt date/time, parking situation and where to check in, pre-test NPO status and medications ordered, and verified current allergies; name and call back number provided for further questions should they arise Nisaiah Bechtol RN Navigator Cardiac Imaging [facility] [phone] office [phone] cell 

## 2020-10-20 NOTE — Telephone Encounter (Signed)
Attempted to call patient regarding upcoming cardiac CT appointment. °Left message on voicemail with name and callback number °Ricky Gallery RN Navigator Cardiac Imaging °[facility] °[phone] Office °[phone] Cell ° °

## 2020-10-23 ENCOUNTER — Other Ambulatory Visit: Payer: Self-pay

## 2020-10-23 ENCOUNTER — Ambulatory Visit (HOSPITAL_COMMUNITY)
Admission: RE | Admit: 2020-10-23 | Discharge: 2020-10-23 | Disposition: A | Discharge status: Home / Self Care | Payer: 59 | Source: Ambulatory Visit | Attending: Internal Medicine | Admitting: Internal Medicine

## 2020-10-23 DIAGNOSIS — R072 Precordial pain: Secondary | ICD-10-CM | POA: Insufficient documentation

## 2020-10-23 MED ORDER — NITROGLYCERIN 0.4 MG SL SUBL
0.8000 mg | SUBLINGUAL_TABLET | Freq: Once | SUBLINGUAL | Status: AC
  Administered 2020-10-23: 0.8 mg via SUBLINGUAL

## 2020-10-23 MED ORDER — IOHEXOL 350 MG/ML SOLN
80.0000 mL | Freq: Once | INTRAVENOUS | Status: AC | PRN
  Administered 2020-10-23: 80 mL via INTRAVENOUS

## 2020-10-23 MED ORDER — NITROGLYCERIN 0.4 MG SL SUBL
SUBLINGUAL_TABLET | SUBLINGUAL | Status: AC
  Filled 2020-10-23: qty 2

## 2020-10-26 ENCOUNTER — Ambulatory Visit (INDEPENDENT_AMBULATORY_CARE_PROVIDER_SITE_OTHER): Payer: 59 | Admitting: Sports Medicine

## 2020-10-26 ENCOUNTER — Other Ambulatory Visit: Payer: Self-pay

## 2020-10-26 VITALS — BP 134/88 | Ht 74.0 in | Wt 320.0 lb

## 2020-10-26 DIAGNOSIS — M778 Other enthesopathies, not elsewhere classified: Secondary | ICD-10-CM | POA: Diagnosis not present

## 2020-10-26 NOTE — Progress Notes (Signed)
   Office Visit Note   Patient: [name]           Date of Birth: 16-Dec-1965           MRN: [id number] Visit Date: 10/26/2020 Requested by: [name], [address] [place],  [place] [address] PCP: [name], MD  Subjective: CC: Follow-up left wrist pain  HPI: 55 year old male presenting to clinic to follow-up on left wrist pain, previously diagnosed as flexor carpi radialis tendinitis based on ultrasound findings.  Patient says he has been compliant with his wrist brace, and this offers relief when he wears it, however his pain quickly returns once he takes it off.  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              ROS:   All other systems were reviewed and are negative.  Objective: Vital Signs: BP 134/88   Ht 6\' 2"  (1.88 m)   Wt (!) 320 lb (145.2 kg)   BMI 41.09 kg/m   Physical Exam:  General:  Alert and oriented, in no acute distress. Pulm:  Breathing unlabored. Psy:  Normal mood, congruent affect. Skin: Left hand/wrist without bruises, rashes, or erythema. Overlying skin intact.  Left Wrist Exam:  No obvious swelling, deformity or discoloration.  ROM: Full Active ROM in wrist and all fingers without pain.  Strength: Endorses mild discomfort with resisted wrist flexion.  No pain with resisted wrist extension Normal Grip strength, Normal thumb abduction and opposition, though he does endorse some discomfort with this.  Normal finger abduction and adduction, and normal flexor superficialis (PIP) and Profundus strength (DIP).   Palpation: Tenderness to palpation specifically over the left flexor carpi radialis insertion on the volar aspect of the wrist.  No specific tenderness over dorsal wrist, no Snuff Box tenderness. No pain with compression over the lunate and triquetrum.  No pain with compression of first CMC joint.  Carpal tunnel:  Tinel's Negative. DeQuervain's Tenosynovitis- Finklestein's test negative  Sensation intact throughout all fingers Brisk capillary refill.    Assessment & Plan: 55 year old male presenting to clinic with concerns of ongoing left wrist pain which has been persistent for the past 4 months.  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 -Given failure of bracing and conservative care thus far, will order advanced imaging to help better evaluate underlying wrist pathology. -Follow-up pending MRI results to determine neck steps in care. -Patient expresses understanding with plan.  Had no further questions or concerns today.   Patient seen and evaluated with the sports medicine fellow.  I agree with the above plan of care.  Although previous ultrasound showed fluid around the flexor carpi radialis tendon, his length of symptoms and failure to improve with treatment have me concerned for some other soft tissue pathology.  Therefore we will work this up further with an x-ray and an MRI.  Phone follow-up with those results when available and we will delineate further treatment based on those findings.  In the meantime, he may continue with his thumb spica brace at work.

## 2020-10-30 ENCOUNTER — Ambulatory Visit
Admission: RE | Admit: 2020-10-30 | Discharge: 2020-10-30 | Disposition: A | Discharge status: Home / Self Care | Payer: 59 | Source: Ambulatory Visit | Attending: Sports Medicine | Admitting: Sports Medicine

## 2020-10-30 DIAGNOSIS — M778 Other enthesopathies, not elsewhere classified: Secondary | ICD-10-CM

## 2020-11-03 ENCOUNTER — Encounter: Payer: Self-pay | Admitting: Family Medicine

## 2020-11-07 ENCOUNTER — Telehealth: Payer: Self-pay | Admitting: Internal Medicine

## 2020-11-07 NOTE — Telephone Encounter (Signed)
The patient has been notified of the CT result and verbalized understanding.  All questions (if any) were answered. Darrell Jewel, RN 11/07/2020 5:11 PM

## 2020-11-07 NOTE — Telephone Encounter (Signed)
Patient returning call for CT results. 

## 2020-11-09 ENCOUNTER — Other Ambulatory Visit: Payer: 59

## 2020-11-19 ENCOUNTER — Encounter: Payer: Self-pay | Admitting: Cardiovascular Disease

## 2020-11-19 NOTE — Progress Notes (Signed)
[name] Date of Birth  [birth year]       [facility] Office [address], [address], [place], [place]  [address]   [place], [place]  [address] [place]   Fax  [phone]     Fax [phone]  Problem List: 1. Atrial fibrillation 2. Hypertension 3. Gout 4. Gastroesophageal reflux disease 5. Obstructive sleep apnea 6. Hyperlipidemia    [name] is a 55 year old gentleman who was recently admitted to [facility] with an episode of rapid atrial fibrillation.  He converted on a diltiazem drip.  He did well until this past Saturday when He had another episode of atrial fibrillation.  He does not drink any alcohol.  Does not smoke cigarettes.  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  Echo : Left ventricle: The cavity size was normal. Wall thickness was increased in a pattern of mild LVH. Systolic function was normal. The estimated ejection fraction was in the range of 55% to 60%. Wall motion was normal; there were no regional wall motion abnormalities. Features are consistent with a pseudonormal left ventricular filling pattern, with concomitant abnormal relaxation and increased filling pressure (grade 2 diastolic dysfunction). - Aortic valve: There was no stenosis. - Mitral valve: There was no significant regurgitation. - Left atrium: The atrium was mildly dilated. - Right ventricle: The cavity size was mildly dilated. Systolic function was normal. - Right atrium: The atrium was mildly dilated. - Pulmonary arteries: No complete TR doppler jet so unable to estimate PA systolic pressure. - Systemic veins: IVC measured 2.1 cm with normal respirophasic variation, suggesting RA pressure 8 mmHg  He has done well.  He has joined the gym   Dec. 5, 2016:  [name] is seen today for follow-up on emergency room visit. He was seen in the ER for hypertension and atrial fibrillation. Converted back to  NSR while in the ER. Has had more paroxysmal atrial fib.  - had a bad episode 2 weeks ago .  Had run out of his Eliquis ( pharmacy could not get it for some reason )  Has been to the A-fib clinic  BP is higher - had more salt than usual yesterday .  BP is typically 120s / 80s. Has tried amlodipine and had episodes of hypotension .   June 22, 2018:  Time he is seen back today for follow-up of his paroxysmal atrial fibrillation.  He also has a history of hyperlipidemia and hypertension. Still has some rare episodes of AF    BP is mildly elevated.  Has been having some gout issues - is taking Indocin several at bedtime .  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  He has been on Tikosyn.  He needs a basic metabolic profile and magnesium levels drawn today. ECG on Aug. 26 shows NSR .  Normal QT  Having lots of joint issus  Wt = 340 lbs  ( up 18 lbs from last year )   November 20, 2020: [name] is seen today for follow up of his ATrial fib On tikosyn Wt is  331 lbs.  Has had 2 episodes of PAF .   Will last several hours ,  Feels porrly when he is in atril fib .  Wears his CPAP for OSA .  Goes to the gym ,  Is not working out very hard at this point .  Has had some chest pain when he works out too hard,  Had normal coronaries by cath in 2018.   Current Outpatient Medications on File Prior to Visit  Medication Sig Dispense Refill  . atorvastatin (LIPITOR) 20 MG tablet Take 1 tablet by mouth once daily 90 tablet 3  . cetirizine (ZYRTEC) 10 MG tablet Take 1 tablet (10 mg total) by mouth daily. 30 tablet 11  . clobetasol ointment (TEMOVATE) 3.54 % Apply 1 application topically 2 (two) times daily. 30 g 0  . diclofenac Sodium (VOLTAREN) 1 % GEL Apply 2 g topically 4 (four) times daily. 150 g 1  . diltiazem (CARDIZEM) 30 MG  tablet Take 1 tablet every 4 hours AS NEEDED for elevated heart rate 30 tablet 1  . dofetilide (TIKOSYN) 500 MCG capsule Take 1 capsule (500 mcg total) by mouth 2 (two) times daily. 60 capsule 12  . ELIQUIS 5 MG TABS tablet Take 1 tablet by mouth twice daily 180 tablet 3  . fluticasone (FLONASE) 50 MCG/ACT nasal spray Place 2 sprays into both nostrils daily. 16 g 6  . metoprolol succinate (TOPROL XL) 25 MG 24 hr tablet Take 0.5 tablets (12.5 mg total) by mouth daily. 30 tablet 3  . MITIGARE 0.6 MG CAPS Take 1 capsule by mouth daily. 90 capsule 0  . Multiple Vitamin (MULTIVITAMIN WITH MINERALS) TABS tablet Take 1 tablet by mouth at bedtime.    . Omega-3 Fatty Acids (FISH OIL) 1000 MG CAPS Take 2,000 mg by mouth in the morning.     Marland Kitchen omeprazole (PRILOSEC) 20 MG capsule Take 1 capsule (20 mg total) by mouth 2 (two) times daily before a meal. 180 capsule 0  . RESTASIS 0.05 % ophthalmic emulsion Place 1 drop into both eyes 2 (two) times daily.     . tamsulosin (FLOMAX) 0.4 MG CAPS capsule Take 1 capsule (0.4 mg total) by mouth daily. 30 capsule 3  . TURMERIC PO Take 1 capsule by mouth every other day.     Marland Kitchen XIIDRA 5 % SOLN Place 1 drop into both eyes in the morning and at bedtime.      No current facility-administered medications on file prior to visit.    Allergies  Allergen Reactions  . Hydrochlorothiazide-Propranolol [Propranolol-Hctz] Other (See Comments)    Joints lock up  . Penicillins Other (See Comments)    Hives Makes joints stiff and unable to move Has patient had a PCN reaction causing immediate rash, facial/tongue/throat swelling, SOB or lightheadedness with hypotension: No Has patient had a PCN reaction causing severe rash involving mucus membranes or skin necrosis: No Has patient had a PCN reaction that required hospitalization No Has patient had a PCN reaction occurring within the last 10 years: No If all of the above answers are "NO", then may proceed with Cephalosporin use.     Past Medical History:  Diagnosis Date  . Arthritis    "knees" (01/22/2017)  . Chronic lower back pain   . GERD (gastroesophageal reflux disease)   . Gout    "no flares in awhile" (01/22/2017)  . History of MRI of cervical spine 09/25/10   normal; left shoulder normal as well  . Hypertension   . Migraine    "~ q 6 months" (01/22/2017)  . Morbid obesity ([place])    "  I've lost 43# in 15 weeks w/weight watchers" (01/22/2017)  . OSA on CPAP   . Paroxysmal atrial fibrillation ([place]) 7/15   chads2vasc score is 1    Past Surgical History:  Procedure Laterality Date  . CARDIAC CATHETERIZATION  05/2005   "no blockage"  . KNEE ARTHROSCOPY Right 02/2013  . LEFT HEART CATH AND CORONARY ANGIOGRAPHY N/A 01/27/2017   Procedure: Left Heart Cath and Coronary Angiography;  Surgeon: Troy Sine, MD;  Location: [facility];  Service: Cardiovascular;  Laterality: N/A;    Social History   Tobacco Use  Smoking Status Former Smoker  . Packs/day: 1.00  . Years: 10.00  . Pack years: 10.00  . Types: Cigarettes  . Quit date: 04/02/1994  . Years since quitting: 26.6  Smokeless Tobacco Former Systems developer  . Types: Chew  Tobacco Comment   "chewed in my teens"    Social History   Substance and Sexual Activity  Alcohol Use No  . Alcohol/week: 0.0 standard drinks   Comment: "stopped in 1994"    Family History  Problem Relation Age of Onset  . Atrial fibrillation Mother 50       had a stroke  . Hypertension Father   . Diabetes Father     Reviw of Systems:  Reviewed in the HPI.  All other systems are negative.   Physical Exam: Blood pressure (!) 148/86, pulse 62, height 6\' 2"  (1.88 m), weight (!) 331 lb 6.4 oz (150.3 kg), SpO2 96 %.  GEN: morbidly, middle age  obese male,   HEENT: Normal NECK: No JVD; No carotid bruits LYMPHATICS: No lymphadenopathy CARDIAC: RRR , no murmurs, rubs, gallops RESPIRATORY:  Clear to auscultation without rales, wheezing or rhonchi  ABDOMEN: Soft,  non-tender, non-distended MUSCULOSKELETAL:  No edema; No deformity  SKIN: Warm and dry NEUROLOGIC:  Alert and oriented x 3   ECG:   Assessment / Plan:   1. Atrial fibrillation -    On Tikosyn Will check BMP and Mag levels today  ECG was done a month ago  .   2. Hypertension-    BP is well controlled.    4. Gastroesophageal reflux disease  5. Obstructive sleep apnea    Mertie Moores, MD  11/20/2020 4:13 PM    [facility],  [place], [place]  [address] Pager [phone] Phone: [phone]; Fax: [phone]

## 2020-11-20 ENCOUNTER — Ambulatory Visit: Payer: 59 | Admitting: Cardiovascular Disease

## 2020-11-20 ENCOUNTER — Other Ambulatory Visit: Payer: Self-pay

## 2020-11-20 ENCOUNTER — Encounter: Payer: Self-pay | Admitting: Cardiovascular Disease

## 2020-11-20 ENCOUNTER — Ambulatory Visit: Payer: 59 | Admitting: Sports Medicine

## 2020-11-20 VITALS — Ht 74.0 in | Wt 320.0 lb

## 2020-11-20 VITALS — BP 148/86 | HR 62 | Ht 74.0 in | Wt 331.4 lb

## 2020-11-20 DIAGNOSIS — M25532 Pain in left wrist: Secondary | ICD-10-CM

## 2020-11-20 DIAGNOSIS — Z6841 Body Mass Index (BMI) 40.0 and over, adult: Secondary | ICD-10-CM

## 2020-11-20 DIAGNOSIS — I480 Paroxysmal atrial fibrillation: Secondary | ICD-10-CM

## 2020-11-20 MED ORDER — LOSARTAN POTASSIUM 50 MG PO TABS: 50.0000 mg | ORAL_TABLET | Freq: Every day | ORAL | 3 refills | Status: DC

## 2020-11-20 NOTE — Patient Instructions (Signed)
    [facility] [address] [place] Thursday 12/07/20 at 11:15a [place]

## 2020-11-20 NOTE — Patient Instructions (Signed)
Medication Instructions:  Your physician has recommended you make the following change in your medication:   START Losartan 50mg  daily  *If you need a refill on your cardiac medications before your next appointment, please call your pharmacy*   Lab Work: TODAY: BMP and Mg  Your physician recommends that you return for lab work in:3 weeks for a bmet  If you have labs (blood work) drawn today and your tests are completely normal, you will receive your results only by: Marland Kitchen MyChart Message (if you have MyChart) OR . A paper copy in the mail If you have any lab test that is abnormal or we need to change your treatment, we will call you to review the results.   Testing/Procedures: none   Follow-Up: At [facility], you and your health needs are our priority.  As part of our continuing mission to provide you with exceptional heart care, we have created designated Provider Care Teams.  These Care Teams include your primary Cardiologist (physician) and Advanced Practice Providers (APPs -  Physician Assistants and Nurse Practitioners) who all work together to provide you with the care you need, when you need it.   Your next appointment:   6 month(s)  The format for your next appointment:   In Person  Provider:   Richardson Dopp, PA-C

## 2020-11-21 LAB — BASIC METABOLIC PANEL
BUN/Creatinine Ratio: 11 (ref 9–20)
BUN: 10 mg/dL (ref 6–24)
CO2: 22 mmol/L (ref 20–29)
Calcium: 9.5 mg/dL (ref 8.7–10.2)
Chloride: 105 mmol/L (ref 96–106)
Creatinine, Ser: 0.92 mg/dL (ref 0.76–1.27)
Glucose: 93 mg/dL (ref 65–99)
Potassium: 4.3 mmol/L (ref 3.5–5.2)
Sodium: 142 mmol/L (ref 134–144)
eGFR: 98 mL/min/{1.73_m2} (ref >59)

## 2020-11-21 LAB — MAGNESIUM: Magnesium: 2 mg/dL (ref 1.6–2.3)

## 2020-11-21 NOTE — Progress Notes (Signed)
   Subjective:    Patient ID: [name], male    DOB: [birth year], 55 y.o.   MRN: [id number]  HPI   [name] comes in today for follow-up on left wrist pain.  Pain has been present now for many weeks.  X-ray of the left wrist recently showed osteoarthritis.  He continues to have pain despite his wrist brace.  He continues to localizes pain primarily to the volar aspect of the wrist.  Worse with holding heavy objects.    Review of Systems As above    Objective:   Physical Exam Well-developed, well-nourished.  No acute distress  Left wrist: Full range of motion.  Reproducible pain with active wrist flexion and radial deviation.  He is tender to palpation along the radial volar wrist.  No effusion.  No soft tissue swelling.  No tenderness across the dorsum of the wrist.  Good grip strength.  Good pulses.  X-rays are as above       Assessment & Plan:   Left wrist pain possibly secondary to osteoarthritis  I previously considered ordering an MRI of his left wrist but I decided to instead refer him to Dr. Grandville Silos for his opinion.  If Dr. Grandville Silos believes that his symptoms are from his left wrist osteoarthritis, he may elect to inject rather than ordering further diagnostic imaging.  I will defer further work-up and treatment to the discretion of Dr. Grandville Silos and [name] will follow-up with me as needed.

## 2020-12-02 ENCOUNTER — Other Ambulatory Visit: Payer: Self-pay | Admitting: Family Medicine

## 2020-12-05 ENCOUNTER — Other Ambulatory Visit: Payer: Self-pay | Admitting: Family Medicine

## 2020-12-05 ENCOUNTER — Other Ambulatory Visit: Payer: Self-pay | Admitting: *Deleted

## 2020-12-06 NOTE — Telephone Encounter (Signed)
Please let patient know I am refilling this medication, but he needs to schedule an appointment with me. It's been almost a year since I saw him.  Thanks, [name], MD

## 2020-12-12 ENCOUNTER — Encounter (HOSPITAL_COMMUNITY): Payer: Self-pay | Admitting: Nurse Practitioner

## 2020-12-12 ENCOUNTER — Ambulatory Visit (HOSPITAL_COMMUNITY)
Admission: RE | Admit: 2020-12-12 | Discharge: 2020-12-12 | Disposition: A | Discharge status: Home / Self Care | Payer: 59 | Source: Ambulatory Visit | Attending: Nurse Practitioner | Admitting: Nurse Practitioner

## 2020-12-12 ENCOUNTER — Other Ambulatory Visit: Payer: Self-pay

## 2020-12-12 VITALS — BP 146/82 | HR 61 | Ht 74.0 in | Wt 331.2 lb

## 2020-12-12 DIAGNOSIS — I10 Essential (primary) hypertension: Secondary | ICD-10-CM | POA: Insufficient documentation

## 2020-12-12 DIAGNOSIS — Z79899 Other long term (current) drug therapy: Secondary | ICD-10-CM | POA: Insufficient documentation

## 2020-12-12 DIAGNOSIS — I480 Paroxysmal atrial fibrillation: Secondary | ICD-10-CM

## 2020-12-12 DIAGNOSIS — Z8249 Family history of ischemic heart disease and other diseases of the circulatory system: Secondary | ICD-10-CM | POA: Diagnosis not present

## 2020-12-12 DIAGNOSIS — I4891 Unspecified atrial fibrillation: Secondary | ICD-10-CM | POA: Diagnosis not present

## 2020-12-12 DIAGNOSIS — R0789 Other chest pain: Secondary | ICD-10-CM | POA: Insufficient documentation

## 2020-12-12 DIAGNOSIS — D6869 Other thrombophilia: Secondary | ICD-10-CM

## 2020-12-12 DIAGNOSIS — Z87891 Personal history of nicotine dependence: Secondary | ICD-10-CM | POA: Insufficient documentation

## 2020-12-12 DIAGNOSIS — E669 Obesity, unspecified: Secondary | ICD-10-CM | POA: Insufficient documentation

## 2020-12-12 DIAGNOSIS — Z7901 Long term (current) use of anticoagulants: Secondary | ICD-10-CM | POA: Diagnosis not present

## 2020-12-12 MED ORDER — ELIQUIS 5 MG PO TABS: 1.0000 | ORAL_TABLET | Freq: Two times a day (BID) | ORAL | 3 refills | Status: DC

## 2020-12-12 NOTE — Progress Notes (Addendum)
Patient ID: [name], male   DOB: [birth year], 55 y.o.   MRN: [id number]     Primary Care Physician: [name], MD Referring Physician: Dr. Rayann Heman, [facility] f/u Cardiologist: Dr. Leonides Cave is a 55 y.o. male with a h/o PAF on  tikosyn. He feels that mostly he is staying in [place]. He did have afib last night after eating a high salt dinner at [facility]. He is in normal rhythm today. He is being compliant with Tikosyn/ eliquis. No bleeding issues.  He is using cpap. He is back at [employer] on the machines with social distancing in place.   F/u in afib clinic, 10/26/29. He asked to be seen today as he will notice fluttering in his chest but his HR will be lower in the 40's. He did notice one episode of afib with HR's in the 150's x 1. He notices more shortness of breath when he has the fluttering/slow HR spells. No change in health. Eye drop Xiidra, added 2 weeks ago for dry eye. Continues on eliquis 5 mg bid for a CHA2DS2VASc score of 1.   F/u in the afib clinic, 5/3,  for f/u ER visit 4/7,  for chest pain. It was not felt to be cardiac in origin. He was in afib in the ER but converted. The  chest pain  was not associated with afib over the preceding days at home with same. Dr. Burt Knack saw on consult in the ER and cleared to go home. He wore a monitor that showed 7% afib burden. HE feels the chest pain was form stress dealing with his job. 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 He is not an ablation candidate for his weight at 328 lbs today. He is   back on [facility] for his weight loss efforts. He lost 50 lbs one time doing [employer], but went on vacation and got off [place] at that time.   F/u afib clinic, 12/12/20. He is in SR today. He has ongoing   atypical chest pain. Recent cardiac CT showed no significant coronary disease so doubt coronary origin. He tells me he has recently reduced his omeprazole to once a day and has noted citric food will make it  worse. He has also increased coffee intake. He denies ever having an endoscopy. He does lift heavy weights and another origin of pain may be MS. He often will notice on sitting to standing. He remains on tikosyn and eliquis.   Today, he denies symptoms of palpitations, chest pain, shortness of breath, orthopnea, PND, lower extremity edema, dizziness, presyncope, syncope, or neurologic sequela. Positive for back pain.The patient is tolerating medications without difficulties and is otherwise without complaint today.   Past Medical History:  Diagnosis Date  . Arthritis    "knees" (01/22/2017)  . Chronic lower back pain   . GERD (gastroesophageal reflux disease)   . Gout    "no flares in awhile" (01/22/2017)  . History of MRI of cervical spine 09/25/10   normal; left shoulder normal as well  . Hypertension   . Migraine    "~ q 6 months" (01/22/2017)  . Morbid obesity ([place])    "I've lost 43# in 15 weeks w/weight watchers" (01/22/2017)  . OSA on CPAP   . Paroxysmal atrial fibrillation ([place]) 7/15   chads2vasc score is 1   Past Surgical History:  Procedure Laterality Date  . CARDIAC CATHETERIZATION  05/2005   "  no blockage"  . KNEE ARTHROSCOPY Right 02/2013  . LEFT HEART CATH AND CORONARY ANGIOGRAPHY N/A 01/27/2017   Procedure: Left Heart Cath and Coronary Angiography;  Surgeon: Troy Sine, MD;  Location: [facility];  Service: Cardiovascular;  Laterality: N/A;    Current Outpatient Medications  Medication Sig Dispense Refill  . atorvastatin (LIPITOR) 20 MG tablet Take 1 tablet by mouth once daily 90 tablet 3  . cetirizine (ZYRTEC) 10 MG tablet Take 1 tablet (10 mg total) by mouth daily. 30 tablet 11  . clobetasol ointment (TEMOVATE) 0.81 % Apply 1 application topically 2 (two) times daily. 30 g 0  . diclofenac Sodium (VOLTAREN) 1 % GEL Apply 2 g topically 4 (four) times daily. 150 g 1  . diltiazem (CARDIZEM) 30 MG tablet Take 1 tablet every 4 hours AS NEEDED for elevated heart  rate 30 tablet 1  . dofetilide (TIKOSYN) 500 MCG capsule Take 1 capsule (500 mcg total) by mouth 2 (two) times daily. 60 capsule 12  . fluticasone (FLONASE) 50 MCG/ACT nasal spray Place 2 sprays into both nostrils daily. 16 g 6  . losartan (COZAAR) 50 MG tablet Take 1 tablet (50 mg total) by mouth daily. 90 tablet 3  . metoprolol succinate (TOPROL XL) 25 MG 24 hr tablet Take 0.5 tablets (12.5 mg total) by mouth daily. 30 tablet 3  . MITIGARE 0.6 MG CAPS Take 1 capsule by mouth once daily 90 capsule 0  . Multiple Vitamin (MULTIVITAMIN WITH MINERALS) TABS tablet Take 1 tablet by mouth at bedtime.    . Omega-3 Fatty Acids (FISH OIL) 1000 MG CAPS Take 2,000 mg by mouth in the morning.     Marland Kitchen omeprazole (PRILOSEC) 20 MG capsule TAKE 1 CAPSULE BY MOUTH TWICE DAILY BEFORE A MEAL 180 capsule 0  . RESTASIS 0.05 % ophthalmic emulsion Place 1 drop into both eyes 2 (two) times daily.     . tamsulosin (FLOMAX) 0.4 MG CAPS capsule Take 1 capsule (0.4 mg total) by mouth daily. 30 capsule 3  . TURMERIC PO Take 1 capsule by mouth every other day.     Marland Kitchen XIIDRA 5 % SOLN Place 1 drop into both eyes in the morning and at bedtime.     Marland Kitchen apixaban (ELIQUIS) 5 MG TABS tablet Take 1 tablet (5 mg total) by mouth 2 (two) times daily. 180 tablet 3   No current facility-administered medications for this encounter.    Allergies  Allergen Reactions  . Hydrochlorothiazide-Propranolol [Propranolol-Hctz] Other (See Comments)    Joints lock up  . Penicillins Other (See Comments)    Hives Makes joints stiff and unable to move Has patient had a PCN reaction causing immediate rash, facial/tongue/throat swelling, SOB or lightheadedness with hypotension: No Has patient had a PCN reaction causing severe rash involving mucus membranes or skin necrosis: No Has patient had a PCN reaction that required hospitalization No Has patient had a PCN reaction occurring within the last 10 years: No If all of the above answers are "NO", then  may proceed with Cephalosporin use.    Social History   Socioeconomic History  . Marital status: Married    Spouse name: Not on file  . Number of children: Not on file  . Years of education: Not on file  . Highest education level: Not on file  Occupational History  . Not on file  Tobacco Use  . Smoking status: Former Smoker    Packs/day: 1.00    Years: 10.00  Pack years: 10.00    Types: Cigarettes    Quit date: 04/02/1994    Years since quitting: 26.7  . Smokeless tobacco: Former Systems developer    Types: Chew  . Tobacco comment: "chewed in my teens"  Vaping Use  . Vaping Use: Never used  Substance and Sexual Activity  . Alcohol use: No    Alcohol/week: 0.0 standard drinks    Comment: "stopped in 1994"  . Drug use: Not Currently    Types: Marijuana    Comment: "stopped marijuna in 1994"  . Sexual activity: Yes  Other Topics Concern  . Not on file  Social History Narrative   Lives in [place] with spouse.  3 children, all grown.   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 Atrial fibrillation Mother 4       had a stroke  . Hypertension Father   . Diabetes Father     ROS- All systems are reviewed and negative except as per the HPI above  Physical Exam: Vitals:   12/12/20 1507  BP: (!) 146/82  Pulse: 61  Weight: (!) 150.2 kg  Height: 6\' 2"  (1.88 m)    GEN- The patient is well appearing, alert and oriented x 3 today.   Head- normocephalic, atraumatic Eyes-  Sclera clear, conjunctiva pink Ears- hearing intact Oropharynx- clear Neck- supple, no JVP Lymph- no cervical lymphadenopathy Lungs- Clear to ausculation bilaterally,  normal work of breathing Heart- Regular rate and rhythm, no murmurs, rubs or gallops, PMI not laterally displaced GI- soft, NT, ND, + BS Extremities- no clubbing, cyanosis, or edema MS- no significant deformity or atrophy Skin- no rash or lesion Psych- euthymic mood, full affect Neuro- strength and sensation are intact  EKG-Sinus brady at 61 bpm, Pr int 194 ms, QRS int 98 ms, Qtc 438ms Cardiac CT-IMPRESSION: 1. Coronary calcium score of 0.384. This was 59th percentile for age, sex, and race matched control.  2. Normal coronary origin. Left dominance.  3. CAD-RADS 1. Minimal non-obstructive CAD (1-24%). Consider non-atherosclerotic causes of chest pain. Consider preventive therapy and risk factor modification.  Assessment and Plan: 1. afib Afib burden is fairly  low and he is not an ablation candidate for this weight (328lbs)  Continue  dofetilide 500 mg bid, qtc stable, as I feel that no other approach is available  at this time to optimize SR other than  weight loss Not on daily rate control for bradycardia  Continue  cpap Tikosyn labs mid April stable   2. Obesity Continue weight loss efforts Avoid salt    3. HTN Stable   4.  CHA2DS2VASc score of 1 Continue  eliquis 5 mg bid   5. Atypical Chest pain Ongoing  CT- no significant calcification of coronary arteries  Increase PPI back to 2x a day and talk to PCP re a possible endoscopy   F/u with Dr. Rayann Heman in June as scheduled    Geroge Baseman. Vivian Neuwirth, [facility] [address] [place], [place] [address] [phone]

## 2020-12-19 ENCOUNTER — Other Ambulatory Visit: Payer: Self-pay

## 2020-12-19 ENCOUNTER — Encounter: Payer: Self-pay | Admitting: Family Medicine

## 2020-12-19 ENCOUNTER — Ambulatory Visit: Payer: 59 | Admitting: Family Medicine

## 2020-12-19 VITALS — BP 140/82 | HR 67 | Wt 330.2 lb

## 2020-12-19 DIAGNOSIS — R079 Chest pain, unspecified: Secondary | ICD-10-CM

## 2020-12-19 DIAGNOSIS — M545 Low back pain, unspecified: Secondary | ICD-10-CM | POA: Diagnosis not present

## 2020-12-19 DIAGNOSIS — G8929 Other chronic pain: Secondary | ICD-10-CM

## 2020-12-19 DIAGNOSIS — I10 Essential (primary) hypertension: Secondary | ICD-10-CM | POA: Diagnosis not present

## 2020-12-19 MED ORDER — LOSARTAN POTASSIUM 50 MG PO TABS
50.0000 mg | ORAL_TABLET | Freq: Every day | ORAL | 0 refills | Status: DC
Start: 2020-12-19 — End: 2021-03-16

## 2020-12-19 NOTE — Assessment & Plan Note (Signed)
Continues to be bothersome for the pt despite conservative therapy. No new or red flag symptoms.  Recommend also using Voltaren gel. Will order MRI for further evaluation

## 2020-12-19 NOTE — Patient Instructions (Signed)
It was great to see you again today!  Restart losartan 50mg  daily Schedule nurse visit for blood pressure check in 1 week Also schedule lab visit at the same time to check kidneys  Referring to GI to see about an endoscopy  Getting MRI of your back  Follow up with me at the end of June as scheduled  Be well, Dr. Ardelia Mems

## 2020-12-19 NOTE — Assessment & Plan Note (Addendum)
BP not at goal of <135/80. Begin 50mg  Losartan. RTC in one week for BP check and labs for electrolytes and kidney function RTC on 6/30 for Provider visit

## 2020-12-19 NOTE — Assessment & Plan Note (Signed)
Suspect pt's chest pain is multifactorial with both costochondritis and GERD. As pt has taken Prilosec for 15 years and endorses occasional nausea, frequent throat clearing and hoarseness, will refer pt to GI for Endoscopy. Also recommend Voltaren gel application for costochondritis element.

## 2020-12-19 NOTE — Progress Notes (Signed)
    SUBJECTIVE:   CHIEF COMPLAINT / HPI:   Toy Eisemann returns today for evaluation of his chronic lower left sided back pain and atypical chest pain.  Chronic back pain Mr Zapien has had chronic left lower back pain for several years that is unchanged and quite bothersome for the pt. 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 Uses Icy hot with some relief but wondering what else could be helpful. Denies any leg weakness, genital numbness or incontinence.  Atypical chest pain Mr Daisey has been evaluated in the ED for chest pain multiple times and follows with Cardiology for his Afib. He saw Cards on 12/12/20 who felt that the patient's chest pain may be related to GERD. The pt endorses sharp pain that feels like a broken bone in his sternum/right ribs, which is worse with coughing, straightening up from a bend, and pec flys when working out. Also endorses nausea occasionally with chest pain, needs to clear his throat often and endorses hoarseness. He has taken 20mg  Prilosec BID for 15 years.   Blood pressure Was 140/82 in clinic, and remained so on recheck. Pt checks BP at home and notes that sometimes it is in 140s/90s, other times 130/80. Was prescribed Losartan previously by Cards but did not end up filling this.  OBJECTIVE:   BP 140/82   Pulse 67   Wt (!) 330 lb 3.2 oz (149.8 kg)   SpO2 97%   BMI 42.40 kg/m   Physical Exam Constitutional:      Appearance: Normal appearance.  Cardiovascular:     Heart sounds: Normal heart sounds.  Pulmonary:     Effort: Pulmonary effort is normal.     Breath sounds: Normal breath sounds.  Abdominal:     Palpations: Abdomen is soft.     Tenderness: There is no abdominal tenderness. There is no guarding.  Musculoskeletal:     Comments: Tender to pressure applied to sternum.  Neurological:     Mental Status: He is alert and oriented to person, place, and time.  Psychiatric:        Mood and Affect: Mood normal.        Behavior:  Behavior normal.      ASSESSMENT/PLAN:   HYPERTENSION, BENIGN SYSTEMIC BP not at goal of <135/80. Begin 50mg  Losartan. RTC in one week for BP check and labs for electrolytes and kidney function RTC on 6/30 for Provider visit  Chronic back pain Continues to be bothersome for the pt despite conservative therapy. No new or red flag symptoms.  Recommend also using Voltaren gel. Will order MRI for further evaluation  Chest pain of uncertain etiology Suspect pt's chest pain is multifactorial with both costochondritis and GERD. As pt has taken Prilosec for 15 years and endorses occasional nausea, frequent throat clearing and hoarseness, will refer pt to GI for Endoscopy. Also recommend Voltaren gel application for costochondritis element.     [place]    Patient seen along with MS3 student [place]. I personally evaluated this patient along with the student, and verified all aspects of the history, physical exam, and medical decision making as documented by the student. I agree with the student's documentation and have made all necessary edits.  Chrisandra Netters, MD  [place]

## 2020-12-21 ENCOUNTER — Telehealth: Payer: Self-pay

## 2020-12-21 NOTE — Telephone Encounter (Signed)
Informed patient of MRI.  [name] 12/29/2020 1900 1830 arrival.  Patient was at work but states that he has [place].  [name], [place]

## 2020-12-26 ENCOUNTER — Other Ambulatory Visit: Payer: 59

## 2020-12-26 ENCOUNTER — Ambulatory Visit: Payer: 59

## 2020-12-29 ENCOUNTER — Other Ambulatory Visit: Payer: Self-pay

## 2020-12-29 ENCOUNTER — Ambulatory Visit (HOSPITAL_COMMUNITY)
Admission: RE | Admit: 2020-12-29 | Discharge: 2020-12-29 | Disposition: A | Discharge status: Home / Self Care | Payer: 59 | Source: Ambulatory Visit | Attending: Family Medicine | Admitting: Family Medicine

## 2020-12-29 ENCOUNTER — Ambulatory Visit (HOSPITAL_COMMUNITY): Payer: 59

## 2020-12-29 ENCOUNTER — Encounter: Payer: Self-pay | Admitting: Family Medicine

## 2020-12-29 ENCOUNTER — Encounter (HOSPITAL_COMMUNITY): Payer: Self-pay

## 2020-12-29 DIAGNOSIS — M48061 Spinal stenosis, lumbar region without neurogenic claudication: Secondary | ICD-10-CM

## 2020-12-29 DIAGNOSIS — M545 Low back pain, unspecified: Secondary | ICD-10-CM | POA: Diagnosis present

## 2020-12-29 DIAGNOSIS — G8929 Other chronic pain: Secondary | ICD-10-CM | POA: Insufficient documentation

## 2021-01-02 ENCOUNTER — Ambulatory Visit: Payer: 59

## 2021-01-02 ENCOUNTER — Other Ambulatory Visit: Payer: 59

## 2021-01-10 ENCOUNTER — Encounter: Payer: Self-pay | Admitting: Internal Medicine

## 2021-01-10 ENCOUNTER — Ambulatory Visit (INDEPENDENT_AMBULATORY_CARE_PROVIDER_SITE_OTHER): Payer: 59 | Admitting: Internal Medicine

## 2021-01-10 ENCOUNTER — Other Ambulatory Visit: Payer: Self-pay

## 2021-01-10 VITALS — BP 124/80 | HR 65 | Ht 74.0 in | Wt 329.0 lb

## 2021-01-10 DIAGNOSIS — G4733 Obstructive sleep apnea (adult) (pediatric): Secondary | ICD-10-CM

## 2021-01-10 DIAGNOSIS — I10 Essential (primary) hypertension: Secondary | ICD-10-CM | POA: Diagnosis not present

## 2021-01-10 DIAGNOSIS — I480 Paroxysmal atrial fibrillation: Secondary | ICD-10-CM

## 2021-01-10 NOTE — Progress Notes (Signed)
PCP: [name], MD Primary Cardiologist: Dr Acie Fredrickson Primary EP: Dr Rayann Heman  [name] is a 55 y.o. male who presents today for routine electrophysiology followup.  Since last being seen in our clinic, the patient reports doing reasonably well.  His primary concern is back pain.  He feels that his AF is currently controlled.   CP is improved.  Today, he denies symptoms of palpitations, shortness of breath,  lower extremity edema, dizziness, presyncope, or syncope.  The patient is otherwise without complaint today.   Past Medical History:  Diagnosis Date  . Arthritis    "knees" (01/22/2017)  . Chronic lower back pain   . GERD (gastroesophageal reflux disease)   . Gout    "no flares in awhile" (01/22/2017)  . History of MRI of cervical spine 09/25/10   normal; left shoulder normal as well  . Hypertension   . Migraine    "~ q 6 months" (01/22/2017)  . Morbid obesity ([place])    "I've lost 43# in 15 weeks w/weight watchers" (01/22/2017)  . OSA on CPAP   . Paroxysmal atrial fibrillation ([place]) 7/15   chads2vasc score is 1   Past Surgical History:  Procedure Laterality Date  . CARDIAC CATHETERIZATION  05/2005   "no blockage"  . KNEE ARTHROSCOPY Right 02/2013  . LEFT HEART CATH AND CORONARY ANGIOGRAPHY N/A 01/27/2017   Procedure: Left Heart Cath and Coronary Angiography;  Surgeon: Troy Sine, MD;  Location: [facility];  Service: Cardiovascular;  Laterality: N/A;    ROS- all systems are reviewed and negatives except as per HPI above  Current Outpatient Medications  Medication Sig Dispense Refill  . apixaban (ELIQUIS) 5 MG TABS tablet Take 1 tablet (5 mg total) by mouth 2 (two) times daily. 180 tablet 3  . atorvastatin (LIPITOR) 20 MG tablet Take 1 tablet by mouth once daily 90 tablet 3  . cetirizine (ZYRTEC) 10 MG tablet Take 1 tablet (10 mg total) by mouth daily. 30 tablet 11  . clobetasol ointment (TEMOVATE) 2.95 % Apply 1 application topically 2 (two) times daily.  30 g 0  . diclofenac Sodium (VOLTAREN) 1 % GEL Apply 2 g topically 4 (four) times daily. 150 g 1  . diltiazem (CARDIZEM) 30 MG tablet Take 1 tablet every 4 hours AS NEEDED for elevated heart rate 30 tablet 1  . dofetilide (TIKOSYN) 500 MCG capsule Take 1 capsule (500 mcg total) by mouth 2 (two) times daily. 60 capsule 12  . fluticasone (FLONASE) 50 MCG/ACT nasal spray Place 2 sprays into both nostrils daily. 16 g 6  . losartan (COZAAR) 50 MG tablet Take 1 tablet (50 mg total) by mouth daily. 90 tablet 0  . metoprolol succinate (TOPROL XL) 25 MG 24 hr tablet Take 0.5 tablets (12.5 mg total) by mouth daily. 30 tablet 3  . MITIGARE 0.6 MG CAPS Take 1 capsule by mouth once daily 90 capsule 0  . Multiple Vitamin (MULTIVITAMIN WITH MINERALS) TABS tablet Take 1 tablet by mouth at bedtime.    . Omega-3 Fatty Acids (FISH OIL) 1000 MG CAPS Take 2,000 mg by mouth in the morning.     Marland Kitchen omeprazole (PRILOSEC) 20 MG capsule TAKE 1 CAPSULE BY MOUTH TWICE DAILY BEFORE A MEAL 180 capsule 0  . RESTASIS 0.05 % ophthalmic emulsion Place 1 drop into both eyes 2 (two) times daily.     . tamsulosin (FLOMAX) 0.4 MG CAPS capsule Take 1 capsule (0.4 mg total) by mouth daily. 30 capsule  3  . TURMERIC PO Take 1 capsule by mouth every other day.     Marland Kitchen XIIDRA 5 % SOLN Place 1 drop into both eyes in the morning and at bedtime.      No current facility-administered medications for this visit.    Physical Exam: Vitals:   01/10/21 1535  BP: 124/80  Pulse: 65  SpO2: 95%  Weight: (!) 329 lb (149.2 kg)  Height: 6\' 2"  (1.88 m)    GEN- The patient is well appearing, alert and oriented x 3 today.   Head- normocephalic, atraumatic Eyes-  Sclera clear, conjunctiva pink Ears- hearing intact Oropharynx- clear Lungs- Clear to ausculation bilaterally, normal work of breathing Heart- Regular rate and rhythm, no murmurs, rubs or gallops, PMI not laterally displaced GI- soft, NT, ND, + BS Extremities- no clubbing, cyanosis, or  edema  Wt Readings from Last 3 Encounters:  01/10/21 (!) 329 lb (149.2 kg)  12/19/20 (!) 330 lb 3.2 oz (149.8 kg)  12/12/20 (!) 331 lb 3.2 oz (150.2 kg)    EKG tracing ordered today is personally reviewed and shows sinus rhythm, QTc 420 msec  Assessment and Plan:  1. Paroxysmal atrial fibrillation The patient has symptomatic, recurrent  atrial fibrillation. he has failed medical therapy with tikosyn. Chads2vasc score is 1.  he is anticoagulated with eliquis . Therapeutic strategies for afib including medicine and ablation were discussed in detail with the patient today. Risk, benefits, and alternatives to EP study and radiofrequency ablation for afib were also discussed in detail today.  At this time, he would prefer to continue tikosyn.  He is more concerned about having his back evaluated.  2. Chest pain Low risk cardiac CT 3/22 reviewed with him today (shows minimal non obstructive CAD)  3. HTN Stable No change required today  4. Morbid obesity Body mass index is 42.24 kg/m. Lifestyle modification is advised   Risks, benefits and potential toxicities for medications prescribed and/or refilled reviewed with patient today.   Follow-up with the AF clinic and Dr Acie Fredrickson every 3 months I will see when needed  Thompson Grayer MD, [facility] 01/10/2021 4:01 PM

## 2021-01-10 NOTE — Patient Instructions (Addendum)
Medication Instructions:  Your physician recommends that you continue on your current medications as directed. Please refer to the Current Medication list given to you today.  Labwork: None ordered.  Testing/Procedures: None ordered.  Follow-Up: Your physician wants you to follow-up in: 3 months at the [facility]. They will contact you to schedule.    Any Other Special Instructions Will Be Listed Below (If Applicable).  If you need a refill on your cardiac medications before your next appointment, please call your pharmacy.

## 2021-01-22 ENCOUNTER — Telehealth: Payer: Self-pay

## 2021-01-22 ENCOUNTER — Encounter: Payer: Self-pay | Admitting: Physician Assistant

## 2021-01-22 NOTE — Telephone Encounter (Signed)
Patient calls nurse line requesting pain medication for chest and back pain. Patient was seen for this same pain on 5/17, however, reports that tylenol has not been relieving pain. Patient has scheduled with GI, however, appointment is not until July. Advised that patient would need an appointment for re evaluation for pain.   Scheduled patient for tomorrow morning. Provided with ED precautions. Patient is not having SHOB at this time and is able to speak in complete sentences.   Talbot Grumbling, RN

## 2021-01-23 ENCOUNTER — Ambulatory Visit: Payer: 59 | Admitting: Family Medicine

## 2021-01-23 ENCOUNTER — Other Ambulatory Visit: Payer: Self-pay

## 2021-01-23 ENCOUNTER — Ambulatory Visit (HOSPITAL_COMMUNITY)
Admission: RE | Admit: 2021-01-23 | Discharge: 2021-01-23 | Disposition: A | Discharge status: Home / Self Care | Payer: 59 | Source: Ambulatory Visit | Attending: Family Medicine | Admitting: Family Medicine

## 2021-01-23 VITALS — BP 148/82 | HR 64 | Ht 74.0 in | Wt 329.4 lb

## 2021-01-23 DIAGNOSIS — R0789 Other chest pain: Secondary | ICD-10-CM | POA: Diagnosis not present

## 2021-01-23 DIAGNOSIS — R079 Chest pain, unspecified: Secondary | ICD-10-CM | POA: Diagnosis present

## 2021-01-23 DIAGNOSIS — I10 Essential (primary) hypertension: Secondary | ICD-10-CM

## 2021-01-23 NOTE — Progress Notes (Signed)
    SUBJECTIVE:   CHIEF COMPLAINT / HPI:   Chest pain This is chronic, but feels like it is worsening in the last 5 days Started taking tylenol, but it hasn't helped Pain doesn't change with eating No shortness of breath, but hurts his chest when he coughs Feels like his chest is tender to touch Hurts worse to bend over and come up Does not occur with exertion He is having pain only if he moves in a certain way If puts himself in a push up position, it hurts the most Hasn't tried anything else for the pain States "I feel it pulling if I'm holding 5 lbs" History of PAF on Eliquis and diltiazem, metoprolol, hypertension He had a low risk cardiac CT on 3/22 that showed minimal nonobstructive coronary artery disease Currently on Lipitor 20 mg  Hypertension Was started on losartan 50 mg daily on 5/17 and was due for BMP, but did not come Has an appoint with PCP scheduled on 6/30  PERTINENT  PMH / PSH: HTN, A fib, OSA, GERD, DJD of cervical and lumbar spine, HLD, Gout, Morbid Obesity  OBJECTIVE:   BP (!) 148/82   Pulse 64   Ht 6\' 2"  (1.88 m)   Wt (!) 329 lb 6.4 oz (149.4 kg)   SpO2 97%   BMI 42.29 kg/m    Physical Exam:  General: 55 y.o. male in NAD Cardio: RRR no m/r/g, TTP sternocostal joints b/l in upper-mid sternum that reproduces pain Lungs: CTAB, no wheezing, no rhonchi, no crackles, no IWOB on RA Skin: warm and dry Extremities: No edema  EKG: NSR, no acute changes   ASSESSMENT/PLAN:   Atypical chest pain Very unlikely cardiac in origin.  EKG unchanged, currently sinus.  Reassured for coronary CT in March as well.  Most likely chest wall pain/costochondritis.  Will treat with topical voltaren gel given contraindication to NSAIDs. Take scheduled tylenol.  Ice between.  Offered time off work, but patient would only like to be off today. F/U with PCP 6/30.  ED precautions given including worsening of symptoms.  HYPERTENSION, BENIGN SYSTEMIC BP is above goal today.   Will have patient continue with current regimen and obtain BMP today.  Follow-up with PCP on 6/30.     Cleophas Dunker, [place]

## 2021-01-23 NOTE — Assessment & Plan Note (Addendum)
Very unlikely cardiac in origin.  EKG unchanged, currently sinus.  Reassured for coronary CT in March as well.  Most likely chest wall pain/costochondritis.  Will treat with topical voltaren gel given contraindication to NSAIDs. Take scheduled tylenol.  Ice between.  Offered time off work, but patient would only like to be off today. F/U with PCP 6/30.  ED precautions given including worsening of symptoms.

## 2021-01-23 NOTE — Patient Instructions (Signed)
Thank you for coming to see me today. It was a pleasure. Today we talked about:   Your EKG looks good.  Your chest pain seems to be coming from your chest wall.  You can get voltaren gel at the pharmacy and use this in the area where it hurts.  You can continue to take tylenol as needed for your pain.  You can also ice the area throughout the day.  Please follow-up with PCP 6/30.  If you have any questions or concerns, please do not hesitate to call the office at [phone].  Best,   [place] Constable, DO

## 2021-01-23 NOTE — Assessment & Plan Note (Signed)
BP is above goal today.  Will have patient continue with current regimen and obtain BMP today.  Follow-up with PCP on 6/30.

## 2021-01-24 LAB — BASIC METABOLIC PANEL
BUN/Creatinine Ratio: 12 (ref 9–20)
BUN: 13 mg/dL (ref 6–24)
CO2: 25 mmol/L (ref 20–29)
Calcium: 9.5 mg/dL (ref 8.7–10.2)
Chloride: 108 mmol/L — ABNORMAL HIGH (ref 96–106)
Creatinine, Ser: 1.08 mg/dL (ref 0.76–1.27)
Glucose: 89 mg/dL (ref 65–99)
Potassium: 4.6 mmol/L (ref 3.5–5.2)
Sodium: 145 mmol/L — ABNORMAL HIGH (ref 134–144)
eGFR: 81 mL/min/{1.73_m2} (ref >59)

## 2021-02-01 ENCOUNTER — Ambulatory Visit (INDEPENDENT_AMBULATORY_CARE_PROVIDER_SITE_OTHER): Payer: 59

## 2021-02-01 ENCOUNTER — Other Ambulatory Visit: Payer: Self-pay

## 2021-02-01 ENCOUNTER — Ambulatory Visit (INDEPENDENT_AMBULATORY_CARE_PROVIDER_SITE_OTHER): Payer: 59 | Admitting: Family Medicine

## 2021-02-01 ENCOUNTER — Encounter: Payer: Self-pay | Admitting: Family Medicine

## 2021-02-01 VITALS — BP 127/72 | HR 61 | Wt 330.6 lb

## 2021-02-01 DIAGNOSIS — I10 Essential (primary) hypertension: Secondary | ICD-10-CM | POA: Diagnosis not present

## 2021-02-01 DIAGNOSIS — R0789 Other chest pain: Secondary | ICD-10-CM | POA: Diagnosis not present

## 2021-02-01 DIAGNOSIS — Z23 Encounter for immunization: Secondary | ICD-10-CM

## 2021-02-01 DIAGNOSIS — M545 Low back pain, unspecified: Secondary | ICD-10-CM

## 2021-02-01 DIAGNOSIS — G8929 Other chronic pain: Secondary | ICD-10-CM | POA: Diagnosis not present

## 2021-02-01 DIAGNOSIS — Z0184 Encounter for antibody response examination: Secondary | ICD-10-CM | POA: Diagnosis not present

## 2021-02-01 MED ORDER — SHINGRIX 50 MCG/0.5ML IM SUSR: 0.5000 mL | Freq: Once | INTRAMUSCULAR | 1 refills | Status: AC

## 2021-02-01 MED ORDER — TRAMADOL HCL 50 MG PO TABS: 50.0000 mg | ORAL_TABLET | Freq: Every evening | ORAL | 0 refills | Status: DC | PRN

## 2021-02-01 NOTE — Patient Instructions (Addendum)
Take tramadol one tab at night to see if this helps you sleep better Hopefully physical therapy will help  Take shingles vaccine to your pharmacy  Follow up with me in 3 months, sooner if needed  Be well, Dr. Ardelia Mems

## 2021-02-01 NOTE — Progress Notes (Signed)
   Covid-19 Vaccination Clinic  Name:  Jamall Strohmeier    MRN: [id number] DOB: [birth year]  02/01/2021  Mr. Mcallister was observed post Covid-19 immunization for 15 minutes without incident. He was provided with Vaccine Information Sheet and instruction to access the V-Safe system.   Mr. Nyman was instructed to call 911 with any severe reactions post vaccine: Difficulty breathing  Swelling of face and throat  A fast heartbeat  A bad rash all over body  Dizziness and weakness

## 2021-02-01 NOTE — Progress Notes (Signed)
  Date of Visit: 02/01/2021   SUBJECTIVE:   HPI:  [name] presents today for routine follow up.  Hypertension - currently taking losartan 50mg  daily, toprol XL 12.5mg  daily. Tolerating these well. Losartan was a recent addition, had acceptable renal function in follow up bmp.  Back pain - referred to NSG. Says he saw them and they recommended against injections or surgery, and instead referred him to physical therapy. He has an appointment with physical therapy on 7/11. Has been taking tylenol 1500mg  about twice a day but says it makes him drowsy. Can also not take muscle relaxers as they make him sleepy as well. Having trouble sleeping due to pain in back and chest.  Chest pain - previously referred to GI for EGD and has upcoming appointment to discuss with GI provider. Used voltaren, which helped a little. Chest pain is unchanged since last visit. Notes it is worse when he moves in certain directions or is lifting heavy things.   OBJECTIVE:   BP 127/72   Pulse 61   Wt (!) 330 lb 9.6 oz (150 kg)   SpO2 99%   BMI 42.45 kg/m  Gen: no acute distress, pleasant, cooperative HEENT: normocephalic, atraumatic  Heart: regular rate and rhythm, no murmur. Chest wall tender to palpation over sternum  Lungs: clear to auscultation bilaterally, normal work of breathing  Neuro: alert, speech normal, grossly nonfocal Back: mildly tender to palpation in paraspinal muscles lumbar area  ASSESSMENT/PLAN:   Health maintenance:  -second COVID booster given today -rx given for shingrix to get at his pharmacy  HYPERTENSION, BENIGN SYSTEMIC Well controlled. Continue current medication regimen.   Chronic back pain Trial of at night tramadol to assist with sleep from pain Has upcoming appointment with physical therapy   Atypical chest pain Has upcoming GI appointment Pain reproducible with palpation today at night tramadol may help with this as well  FOLLOW UP: Follow up in 3 mos for routine  medical issues  [name], [place]

## 2021-02-03 ENCOUNTER — Encounter: Payer: Self-pay | Admitting: Family Medicine

## 2021-02-03 NOTE — Assessment & Plan Note (Signed)
Well controlled. Continue current medication regimen.  

## 2021-02-03 NOTE — Assessment & Plan Note (Signed)
Has upcoming GI appointment Pain reproducible with palpation today at night tramadol may help with this as well

## 2021-02-03 NOTE — Assessment & Plan Note (Signed)
Trial of at night tramadol to assist with sleep from pain Has upcoming appointment with physical therapy

## 2021-02-07 ENCOUNTER — Telehealth (HOSPITAL_COMMUNITY): Payer: Self-pay | Admitting: Emergency Medicine

## 2021-02-07 NOTE — Telephone Encounter (Signed)
Reaching out to patient to offer assistance regarding upcoming cardiac imaging study; pt verbalizes understanding of appt date/time, parking situation and where to check in, pre-test NPO status and medications ordered, and verified current allergies; name and call back number provided for further questions should they arise Wayne Bond RN Navigator Cardiac Imaging [name] [phone] office [phone] cell  50mg  metoprolol 2 hr prior

## 2021-02-12 ENCOUNTER — Other Ambulatory Visit: Payer: Self-pay | Admitting: Family Medicine

## 2021-02-12 ENCOUNTER — Ambulatory Visit: Payer: 59 | Attending: Family Medicine

## 2021-02-12 ENCOUNTER — Other Ambulatory Visit: Payer: Self-pay | Admitting: Cardiovascular Disease

## 2021-02-12 ENCOUNTER — Other Ambulatory Visit: Payer: Self-pay

## 2021-02-12 DIAGNOSIS — R252 Cramp and spasm: Secondary | ICD-10-CM | POA: Diagnosis present

## 2021-02-12 DIAGNOSIS — M6281 Muscle weakness (generalized): Secondary | ICD-10-CM | POA: Insufficient documentation

## 2021-02-12 DIAGNOSIS — M545 Low back pain, unspecified: Secondary | ICD-10-CM | POA: Insufficient documentation

## 2021-02-12 DIAGNOSIS — G8929 Other chronic pain: Secondary | ICD-10-CM | POA: Diagnosis present

## 2021-02-12 NOTE — Patient Instructions (Signed)
Access Code: [email or web address] URL: [email or web address] Date: 02/12/2021 Prepared by: Rich Number  Exercises Supine Posterior Pelvic Tilt - 1 x daily - 7 x weekly - 1 sets - 15 reps - 3 hold Supine Single Knee to Chest Stretch - 1 x daily - 7 x weekly - 1 sets - 3 reps - 20 hold Seated Hamstring Stretch - 1 x daily - 7 x weekly - 1 sets - 3 reps - 20 hold

## 2021-02-12 NOTE — Therapy (Signed)
[place], [place], [address] Phone: [phone]   Fax:  [phone]  Physical Therapy Evaluation  Patient Details  Name: [name] MRN: [id number] Date of Birth: [birth year] Referring Provider (PT): Dawley, Theodoro Doing, DO   Encounter Date: 02/12/2021   PT End of Session - 02/12/21 1730     Visit Number 1    Number of Visits 8    Date for PT Re-Evaluation 04/09/21    Authorization Type UHC    PT Start Time 1633    PT Stop Time 1720    PT Time Calculation (min) 47 min    Activity Tolerance Patient tolerated treatment well    Behavior During Therapy [facility] for tasks assessed/performed             Past Medical History:  Diagnosis Date   Arthritis    "knees" (01/22/2017)   Chronic lower back pain    GERD (gastroesophageal reflux disease)    Gout    "no flares in awhile" (01/22/2017)   History of MRI of cervical spine 09/25/10   normal; left shoulder normal as well   Hypertension    Migraine    "~ q 6 months" (01/22/2017)   Morbid obesity ([place])    "I've lost 43# in 15 weeks w/weight watchers" (01/22/2017)   OSA on CPAP    Paroxysmal atrial fibrillation ([place]) 7/15   chads2vasc score is 1    Past Surgical History:  Procedure Laterality Date   CARDIAC CATHETERIZATION  05/2005   "no blockage"   KNEE ARTHROSCOPY Right 02/2013   LEFT HEART CATH AND CORONARY ANGIOGRAPHY N/A 01/27/2017   Procedure: Left Heart Cath and Coronary Angiography;  Surgeon: Troy Sine, MD;  Location: [place] [facility];  Service: Cardiovascular;  Laterality: N/A;    There were no vitals filed for this visit.    Subjective Assessment - 02/12/21 1637     Subjective [name] reports that his low back has been bothering for a few years.  He states that it has progressed over time.  He is trying to get some exercises to lose weight at the gym.  There seemed to flare it up.  The patient reports a constant slight ache.  Prolong  standing, laying down in the bed, sitting up striaght in a chair, and bending to pick up weights.  The patient reports that he can get numbness and tingling into the leg leg to the foot with prolong sitting.  He feels better with walking.  He does at times need to lift up overhead at work and that will aggravate his pain.  He loads and unloads trucks at work.  He will feel a catching at times and has difficulty standing back up straight.    Limitations Sitting;Lifting;Standing    How long can you sit comfortably? 5 minutes    How long can you stand comfortably? 5-10 minutes    Diagnostic tests MRI: EXAM:  MRI LUMBAR SPINE WITHOUT CONTRAST     TECHNIQUE:  Multiplanar, multisequence MR imaging of the lumbar spine was  performed. No intravenous contrast was administered.     COMPARISON:  Radiographs September 06, 2020     FINDINGS:  Segmentation: A transitional lumbosacral vertebra is assumed to  represent a sacralized L5 level. Careful correlation with this  numbering strategy prior to any procedural intervention would be  recommended.     Alignment:  Physiologic.     Vertebrae: No fracture, evidence of discitis, or  bone lesion.  Congenitally short pedicles in the lumbar spine.     Conus medullaris and cauda equina: Conus extends to the L1 level.  Conus and cauda equina appear normal.     Paraspinal and other soft tissues: A 3.4 cm right renal cyst.     Disc levels:     T12-L1:No spinal canal or neural foraminal stenosis.     L1-2:No spinal canal or neural foraminal stenosis.     L2-3:Mild facet degenerative changes.No spinal canal or neural  foraminal stenosis.     L3-4:No spinal canal or neural foraminal stenosis.     L4-5:Shallow disc bulge, moderate facet degenerative changes and  ligamentum flavum redundancy resulting in mild spinal canal stenosis  with narrowing of the bilateral subarticular zones. No significant  neural foraminal narrowing.     L5-S1:Mild facet degenerative changes.No spinal canal or neural   foraminal stenosis.     IMPRESSION:  Mild degenerative changes of the lumbar spine superimposed on a  congenitally small spinal canal resulting in mild spinal canal  stenosis and narrowing of the bilateral subarticular zones at L4-5.  No high-grade neural foraminal narrowing.    Currently in Pain? Yes    Pain Score 5    At worse in the last week: 9/10 with work activities.   Pain Location Back    Pain Orientation Right;Lower    Pain Descriptors / Indicators Aching    Pain Radiating Towards towards the right leg.    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  No    Is the patient reluctant to leave their home because of a fear of falling?  No      Home Environment   Living Environment Private residence    Living Arrangements Spouse/significant other    Type of [place] to enter    Entrance Stairs-Number of Steps 5-6    Entrance Stairs-Rails Right;Left      Prior Function   Level of Independence Independent    Vocation Full time employment    Vocation Requirements Load and unload trucks    Leisure gym, fishing, bowling, pool.      Cognition   Overall Cognitive Status Within Functional Limits for tasks assessed      Observation/Other  Assessments   Focus on Therapeutic Outcomes (FOTO)  Lumbar 55% - Predicted 59%      Functional Tests   Functional tests Squat      Squat   Comments 1/2 depth  no use of hands      Posture/Postural Control   Posture/Postural Control Postural limitations    Posture Comments sway back posture      ROM / Strength   AROM / PROM / Strength AROM;Strength      AROM   Right Knee Extension 3    Right Knee Flexion 125    Left Knee Extension 0    Left Knee Flexion 125    Lumbar Flexion 75%    Lumbar Extension 75%    Lumbar - Right Side Bend mid thigh - pain at [facility]    Lumbar - Left Side Bend mid thigh - pain at [facility]    Lumbar - Right Rotation 75% - pain at [facility]    Lumbar - Left Rotation 75% - pain at [facility]      Strength   Overall Strength Comments WFL for LE    Lumbar Flexion 3+/5    Lumbar Extension 4-/5      FABER test   findings Negative    Comment Low back pain aggravated with R and L FABER's      Straight Leg Raise   Findings Negative    Comment sciatic N tension                        Objective measurements completed on examination: See above findings.               PT Education - 02/12/21 1716     Education Details POC, HEP, FOTO    Person(s) Educated Patient    Methods Explanation    Comprehension Verbalized understanding              PT Short Term Goals - 02/12/21 1727       PT SHORT TERM GOAL #1   Title He will be indpendent with initial HEP    Time 2    Period Weeks    Target Date 02/26/21               PT Long Term Goals - 02/12/21 1727       PT LONG TERM GOAL #1   Title The patient will have an improved FOTO score of 59% or better.    Baseline 55%    Time 8    Period Weeks    Target Date 04/09/21      PT LONG TERM GOAL #2   Title The patient will report pain at the end of the day at a 4/10 or less    Baseline 8-9/10    Time 8    Period Weeks    Target Date 04/09/21      PT LONG TERM GOAL #3   Title  The patient will present with improvement of lumbar flexor strength to 4+/5 for lifting    Baseline 3+/5    Time 8    Period Weeks    Target Date 04/09/21      PT LONG TERM GOAL #4   Title The patient will be independent with an advanced HEP at the gym for back and LE.    Baseline LE only    Time 8    Period Weeks    Status New    Target Date 04/09/21      PT LONG TERM GOAL #5   Title The patient will be able to sit for 30 minutes without left leg numbness/tingling and pain <3/10.    Baseline 5 minutes  Time 8    Period Weeks    Target Date 04/09/21                    Plan - 02/12/21 1658     Clinical Impression Statement [place] is a 55 y/o male who reports a progression of low back pain over the last three years.  He has recently started to go to the gym with his 81 y/o grandson to lift weights and workout.  He thinks that contributed to the pain.  The patient presents with a sway back posture and reduction of abdominal strength, particularly the lower abdominals.  The patient does report at times numbness and tingling into the left leg with prolong sitting.  He has mild reduction of left Sciatic nerve mobility.  The patient's symptoms do feel better with walking, but he can be limited due to the right knee arthritis.  His job includes loading and unloading trucks.  Recommend physical therapy for core stabilization, hip/LE strengthening and body mechanic training, and flexiblity for reduction of pain with ADLs.    Personal Factors and Comorbidities Comorbidity 2    Comorbidities overweight,  knee OA    Examination-Activity Limitations Bend;Carry;Lift;Stand;Sit    Examination-Participation Restrictions Cleaning;Yard Work;Laundry;Driving;Occupation    Stability/Clinical Decision Making Stable/Uncomplicated    Clinical Decision Making Low    Rehab Potential Good    PT Treatment/Interventions ADLs/Self Care Home Management    PT Next Visit Plan hip flexor stretches, progress  core stabilization, quadriceps/gluteal strengthening.    PT Home Exercise Plan Access Code: [email or web address]    [id number] and Agree with Plan of Care Patient             Patient will benefit from skilled therapeutic intervention in order to improve the following deficits and impairments:  Abnormal gait, Postural dysfunction, Decreased strength, Impaired flexibility, Pain  Visit Diagnosis: Chronic left-sided low back pain without sciatica  Muscle weakness (generalized)     Problem List Patient Active Problem List   Diagnosis Date Noted   Chronic left-sided low back pain without sciatica 09/05/2020   Dyshydrosis 08/19/2020   Tendonitis of wrist, left 08/19/2020   Body mass index 40.0-44.9, adult ([place]) 02/10/2019   Urinary incontinence 01/02/2018   Left groin pain 01/02/2018   Allergic rhinitis 01/02/2018   Healthcare maintenance 09/12/2017   Acute nonintractable headache 02/03/2017   Abnormal stress test    Symptomatic sinus bradycardia    Left sided numbness    Muscle fasciculation    Primary osteoarthritis of right knee 07/23/2016   Plantar fasciitis of right foot 05/07/2016   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/05/2016   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   Dizziness 03/21/2015   Shortness of breath 03/21/2015   OSA on CPAP    PAF (paroxysmal atrial fibrillation) ([facility])    Palpitations    Atypical chest pain 03/03/2015   Left knee pain 10/13/2014   Atrial fibrillation ([place]) 03/14/2014   Right knee pain 11/10/2012   Sinusitis 10/23/2012   Left hip pain 06/17/2011   OSA (obstructive sleep apnea) 05/17/2011   Chronic back pain 10/29/2010   INSOMNIA 09/19/2008   Gout 05/13/2007   Hyperlipidemia 10/02/2006   Morbid obesity ([place]) 10/02/2006   Migraine 10/02/2006   HYPERTENSION, BENIGN SYSTEMIC 10/02/2006   GASTROESOPHAGEAL REFLUX, NO ESOPHAGITIS 10/02/2006   Rich Number, PT, DPT, OCS, Crt. DN  Bethena Midget 02/12/2021, 5:34  PM  [place]  [place], [place], [address] Phone: [phone]   Fax:  [phone]  Name: Dravin Lance MRN: [id number] Date of Birth: 1966/04/17

## 2021-02-18 ENCOUNTER — Encounter (HOSPITAL_COMMUNITY): Payer: Self-pay | Admitting: Emergency Medicine

## 2021-02-18 ENCOUNTER — Other Ambulatory Visit: Payer: Self-pay

## 2021-02-18 ENCOUNTER — Observation Stay (HOSPITAL_COMMUNITY)
Admission: EM | Admit: 2021-02-18 | Discharge: 2021-02-19 | Disposition: A | Discharge status: Home / Self Care | Payer: 59 | Attending: Family Medicine | Admitting: Family Medicine

## 2021-02-18 DIAGNOSIS — I10 Essential (primary) hypertension: Secondary | ICD-10-CM | POA: Diagnosis not present

## 2021-02-18 DIAGNOSIS — T50901A Poisoning by unspecified drugs, medicaments and biological substances, accidental (unintentional), initial encounter: Secondary | ICD-10-CM | POA: Diagnosis present

## 2021-02-18 DIAGNOSIS — Z7901 Long term (current) use of anticoagulants: Secondary | ICD-10-CM | POA: Diagnosis not present

## 2021-02-18 DIAGNOSIS — I4891 Unspecified atrial fibrillation: Secondary | ICD-10-CM

## 2021-02-18 DIAGNOSIS — T447X1A Poisoning by beta-adrenoreceptor antagonists, accidental (unintentional), initial encounter: Secondary | ICD-10-CM | POA: Insufficient documentation

## 2021-02-18 DIAGNOSIS — Z79899 Other long term (current) drug therapy: Secondary | ICD-10-CM | POA: Diagnosis not present

## 2021-02-18 DIAGNOSIS — I480 Paroxysmal atrial fibrillation: Secondary | ICD-10-CM | POA: Diagnosis not present

## 2021-02-18 DIAGNOSIS — Z20822 Contact with and (suspected) exposure to covid-19: Secondary | ICD-10-CM | POA: Insufficient documentation

## 2021-02-18 DIAGNOSIS — T462X1A Poisoning by other antidysrhythmic drugs, accidental (unintentional), initial encounter: Secondary | ICD-10-CM | POA: Diagnosis present

## 2021-02-18 DIAGNOSIS — Z87891 Personal history of nicotine dependence: Secondary | ICD-10-CM | POA: Diagnosis not present

## 2021-02-18 LAB — MAGNESIUM
Magnesium: 2.2 mg/dL (ref 1.7–2.4)
Magnesium: 2.1 mg/dL (ref 1.7–2.4)

## 2021-02-18 LAB — BASIC METABOLIC PANEL
Anion gap: 8 (ref 5–15)
Anion gap: 6 (ref 5–15)
BUN: 11 mg/dL (ref 6–20)
BUN: 8 mg/dL (ref 6–20)
CO2: 26 mmol/L (ref 22–32)
CO2: 26 mmol/L (ref 22–32)
Calcium: 9.1 mg/dL (ref 8.9–10.3)
Calcium: 9.2 mg/dL (ref 8.9–10.3)
Chloride: 106 mmol/L (ref 98–111)
Chloride: 109 mmol/L (ref 98–111)
Creatinine, Ser: 0.92 mg/dL (ref 0.61–1.24)
Creatinine, Ser: 0.95 mg/dL (ref 0.61–1.24)
GFR, Estimated: >60 mL/min (ref >60)
GFR, Estimated: >60 mL/min (ref >60)
Glucose, Bld: 111 mg/dL — ABNORMAL HIGH (ref 70–99)
Glucose, Bld: 121 mg/dL — ABNORMAL HIGH (ref 70–99)
Potassium: 4.2 mmol/L (ref 3.5–5.1)
Potassium: 4.1 mmol/L (ref 3.5–5.1)
Sodium: 140 mmol/L (ref 135–145)
Sodium: 141 mmol/L (ref 135–145)

## 2021-02-18 LAB — RESP PANEL BY RT-PCR (FLU A&B, COVID) ARPGX2
Influenza A by PCR: NEGATIVE
Influenza B by PCR: NEGATIVE
SARS Coronavirus 2 by RT PCR: NEGATIVE

## 2021-02-18 LAB — CBC
HCT: 40.3 % (ref 39.0–52.0)
Hemoglobin: 12.9 g/dL — ABNORMAL LOW (ref 13.0–17.0)
MCH: 26.8 pg (ref 26.0–34.0)
MCHC: 32 g/dL (ref 30.0–36.0)
MCV: 83.8 fL (ref 80.0–100.0)
Platelets: 213 10*3/uL (ref 150–400)
RBC: 4.81 MIL/uL (ref 4.22–5.81)
RDW: 13.5 % (ref 11.5–15.5)
WBC: 5.9 10*3/uL (ref 4.0–10.5)
nRBC: 0 % (ref 0.0–0.2)

## 2021-02-18 MED ORDER — METOPROLOL SUCCINATE ER 25 MG PO TB24: 12.5000 mg | ORAL_TABLET | Freq: Every day | ORAL | Status: DC

## 2021-02-18 MED ORDER — MAGNESIUM SULFATE 2 GM/50ML IV SOLN
2.0000 g | Freq: Once | INTRAVENOUS | Status: DC
  Administered 2021-02-18: 2 g via INTRAVENOUS
  Filled 2021-02-18: qty 50

## 2021-02-18 MED ORDER — COLCHICINE 0.6 MG PO CAPS: 1.0000 | ORAL_CAPSULE | Freq: Every day | ORAL | Status: DC

## 2021-02-18 MED ORDER — ATORVASTATIN CALCIUM 10 MG PO TABS
20.0000 mg | ORAL_TABLET | ORAL | Status: DC
  Administered 2021-02-19: 20 mg via ORAL
  Filled 2021-02-18: qty 2

## 2021-02-18 MED ORDER — LOSARTAN POTASSIUM 50 MG PO TABS
50.0000 mg | ORAL_TABLET | Freq: Every day | ORAL | Status: DC
  Administered 2021-02-18 – 2021-02-19 (×2): 50 mg via ORAL
  Filled 2021-02-18 (×2): qty 1

## 2021-02-18 MED ORDER — DOFETILIDE 500 MCG PO CAPS
500.0000 ug | ORAL_CAPSULE | Freq: Two times a day (BID) | ORAL | Status: DC
  Administered 2021-02-18 – 2021-02-19 (×2): 500 ug via ORAL
  Filled 2021-02-18 (×2): qty 1

## 2021-02-18 MED ORDER — COLCHICINE 0.6 MG PO TABS
0.6000 mg | ORAL_TABLET | Freq: Every day | ORAL | Status: DC
  Administered 2021-02-19: 0.6 mg via ORAL
  Filled 2021-02-18: qty 1

## 2021-02-18 MED ORDER — PANTOPRAZOLE SODIUM 40 MG PO TBEC
40.0000 mg | DELAYED_RELEASE_TABLET | Freq: Every day | ORAL | Status: DC
  Administered 2021-02-18 – 2021-02-19 (×2): 40 mg via ORAL
  Filled 2021-02-18 (×2): qty 1

## 2021-02-18 MED ORDER — TAMSULOSIN HCL 0.4 MG PO CAPS
0.4000 mg | ORAL_CAPSULE | Freq: Every day | ORAL | Status: DC
  Administered 2021-02-19: 0.4 mg via ORAL
  Filled 2021-02-18: qty 1

## 2021-02-18 MED ORDER — APIXABAN 5 MG PO TABS
5.0000 mg | ORAL_TABLET | Freq: Two times a day (BID) | ORAL | Status: DC
  Administered 2021-02-18 – 2021-02-19 (×2): 5 mg via ORAL
  Filled 2021-02-18 (×2): qty 1

## 2021-02-18 NOTE — ED Provider Notes (Signed)
[facility] EMERGENCY DEPARTMENT Provider Note   CSN: [id number] Arrival date & time: 02/18/21  1014     History Chief Complaint  Patient presents with   Took too much of medication    [name] is a 55 y.o. male with past medical history significant for A. fib, chronic back pain, GERD, hypertension who presents for evaluation of accidental overdose.  Patient initially took his home meds this morning at 7 AM.  States subsequently accidentally took his nighttime medication as well at 9 AM.  This medication was Tikosyn 500 mg which she typically takes twice daily as well as metoprolol 12.5 mg ER twice daily.  Total of 1000mg   Tikosyn, 25 mg ER metoprolol.  He contacted poison control who told to come here to emergency department for evaluation and observation.  Patient denies any current complaints.  No chest pain, shortness of breath abdominal pain, diarrhea, dysuria, emesis.  Feels at baseline.  Denies any intent at self-harm.  Denies additional aggravating or alleviating factors.   Poison control>>>MaryAnne  Rec 12 hr observation from 9 AM.  Recommends EKGs every 4 hours as well as getting electrolytes here.  Greatest risk is arrhythmia, bradycardia, specifically torsades.  Recommends keeping potassium greater than 4.0, magnesium greater than 2.  Recommends no antiemetics or  medication such as Geodon, Haldol given prolonged QT risk.  History obtained from patient and past medical records.  No interpreter used.  HPI     Past Medical History:  Diagnosis Date   Arthritis    "knees" (01/22/2017)   Chronic lower back pain    GERD (gastroesophageal reflux disease)    Gout    "no flares in awhile" (01/22/2017)   History of MRI of cervical spine 09/25/10   normal; left shoulder normal as well   Hypertension    Migraine    "~ q 6 months" (01/22/2017)   Morbid obesity ([place])    "I've lost 43# in 15 weeks w/weight watchers" (01/22/2017)   OSA on CPAP    Paroxysmal  atrial fibrillation ([place]) 7/15   chads2vasc score is 1    Patient Active Problem List   Diagnosis Date Noted   Chronic left-sided low back pain without sciatica 09/05/2020   Dyshydrosis 08/19/2020   Tendonitis of wrist, left 08/19/2020   Body mass index 40.0-44.9, adult ([place]) 02/10/2019   Urinary incontinence 01/02/2018   Left groin pain 01/02/2018   Allergic rhinitis 01/02/2018   Healthcare maintenance 09/12/2017   Acute nonintractable headache 02/03/2017   Abnormal stress test    Symptomatic sinus bradycardia    Left sided numbness    Muscle fasciculation    Primary osteoarthritis of right knee 07/23/2016   Plantar fasciitis of right foot 05/07/2016   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/05/2016   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   Dizziness 03/21/2015   Shortness of breath 03/21/2015   OSA on CPAP    PAF (paroxysmal atrial fibrillation) (HCC)    Palpitations    Atypical chest pain 03/03/2015   Left knee pain 10/13/2014   Atrial fibrillation ([place]) 03/14/2014   Right knee pain 11/10/2012   Sinusitis 10/23/2012   Left hip pain 06/17/2011   OSA (obstructive sleep apnea) 05/17/2011   Chronic back pain 10/29/2010   INSOMNIA 09/19/2008   Gout 05/13/2007   Hyperlipidemia 10/02/2006   Morbid obesity ([place]) 10/02/2006   Migraine 10/02/2006   HYPERTENSION, BENIGN SYSTEMIC 10/02/2006   GASTROESOPHAGEAL REFLUX, NO ESOPHAGITIS 10/02/2006  Past Surgical History:  Procedure Laterality Date   CARDIAC CATHETERIZATION  05/2005   "no blockage"   KNEE ARTHROSCOPY Right 02/2013   LEFT HEART CATH AND CORONARY ANGIOGRAPHY N/A 01/27/2017   Procedure: Left Heart Cath and Coronary Angiography;  Surgeon: Troy Sine, MD;  Location: [facility];  Service: Cardiovascular;  Laterality: N/A;       Family History  Problem Relation Age of Onset   Atrial fibrillation Mother 43       had a stroke   Hypertension Father    Diabetes  Father     Social History   Tobacco Use   Smoking status: Former    Packs/day: 1.00    Years: 10.00    Pack years: 10.00    Types: Cigarettes    Quit date: 04/02/1994    Years since quitting: 26.9   Smokeless tobacco: Former    Types: Chew   Tobacco comments:    "chewed in my teens"  Vaping Use   Vaping Use: Never used  Substance Use Topics   Alcohol use: No    Alcohol/week: 0.0 standard drinks    Comment: "stopped in 1994"   Drug use: Not Currently    Types: Marijuana    Comment: "stopped marijuna in 1994"    Home Medications Prior to Admission medications   Medication Sig Start Date End Date Taking? Authorizing Provider  apixaban (ELIQUIS) 5 MG TABS tablet Take 1 tablet (5 mg total) by mouth 2 (two) times daily. 12/12/20   Sherran Needs, NP  atorvastatin (LIPITOR) 20 MG tablet Take 1 tablet by mouth once daily 02/12/21   Nahser, Wonda Cheng, MD  cetirizine (ZYRTEC) 10 MG tablet Take 1 tablet (10 mg total) by mouth daily. 09/14/18   [name], MD  clobetasol ointment (TEMOVATE) 9.76 % Apply 1 application topically 2 (two) times daily. 08/15/20   Carollee Leitz, MD  diclofenac Sodium (VOLTAREN) 1 % GEL Apply 2 g topically 4 (four) times daily. 09/06/20   Benay Pike, MD  diltiazem (CARDIZEM) 30 MG tablet Take 1 tablet every 4 hours AS NEEDED for elevated heart rate Patient not taking: Reported on 02/12/2021 08/21/17   Sherran Needs, NP  dofetilide (TIKOSYN) 500 MCG capsule Take 1 capsule (500 mcg total) by mouth 2 (two) times daily. 06/05/20   Sherran Needs, NP  fluticasone (FLONASE) 50 MCG/ACT nasal spray Place 2 sprays into both nostrils daily. 09/14/18   [name], MD  losartan (COZAAR) 50 MG tablet Take 1 tablet (50 mg total) by mouth daily. 12/19/20   [name], MD  metoprolol succinate (TOPROL XL) 25 MG 24 hr tablet Take 0.5 tablets (12.5 mg total) by mouth daily. 09/21/20 09/21/21  Fenton, Clint R, PA  MITIGARE 0.6 MG CAPS Take 1 capsule by  mouth once daily 12/06/20   [name], MD  Multiple Vitamin (MULTIVITAMIN WITH MINERALS) TABS tablet Take 1 tablet by mouth at bedtime.    [provider]  Omega-3 Fatty Acids (FISH OIL) 1000 MG CAPS Take 2,000 mg by mouth in the morning.     [provider]  omeprazole (PRILOSEC) 20 MG capsule TAKE 1 CAPSULE BY MOUTH TWICE DAILY BEFORE A MEAL 12/06/20   [name], MD  RESTASIS 0.05 % ophthalmic emulsion Place 1 drop into both eyes 2 (two) times daily.  10/07/19   [provider]  tamsulosin (FLOMAX) 0.4 MG CAPS capsule Take 1 capsule by mouth once daily  02/14/21   [name], MD  traMADol (ULTRAM) 50 MG tablet Take 1 tablet (50 mg total) by mouth at bedtime as needed. Patient not taking: Reported on 02/12/2021 02/01/21   [name], MD  TURMERIC PO Take 1 capsule by mouth every other day.     [provider]  XIIDRA 5 % SOLN Place 1 drop into both eyes in the morning and at bedtime.  Patient not taking: Reported on 02/12/2021 10/07/19   [provider]    Allergies    Hydrochlorothiazide-propranolol [propranolol-hctz] and Penicillins  Review of Systems   Review of Systems  Constitutional: Negative.   HENT: Negative.    Respiratory: Negative.    Cardiovascular: Negative.   Gastrointestinal: Negative.   Genitourinary: Negative.   Musculoskeletal: Negative.   Skin: Negative.   Neurological: Negative.   All other systems reviewed and are negative.  Physical Exam Updated Vital Signs BP 128/85   Pulse (!) 56   Temp 98 F (36.7 C) (Oral)   Resp 20   SpO2 100%   Physical Exam Vitals and nursing note reviewed.  Constitutional:      General: He is not in acute distress.    Appearance: He is well-developed. He is obese. He is not ill-appearing, toxic-appearing or diaphoretic.  HENT:     Head: Atraumatic.     Nose: Nose normal.     Mouth/Throat:     Mouth: Mucous membranes are moist.  Eyes:     Pupils:  Pupils are equal, round, and reactive to light.  Cardiovascular:     Rate and Rhythm: Normal rate and regular rhythm.     Pulses: Normal pulses.     Heart sounds: Normal heart sounds.  Pulmonary:     Effort: Pulmonary effort is normal. No respiratory distress.     Breath sounds: Normal breath sounds.  Abdominal:     General: Bowel sounds are normal. There is no distension.     Palpations: Abdomen is soft.  Musculoskeletal:        General: No swelling or tenderness. Normal range of motion.     Cervical back: Normal range of motion and neck supple.     Right lower leg: No edema.     Left lower leg: No edema.  Skin:    General: Skin is warm and dry.     Capillary Refill: Capillary refill takes less than 2 seconds.  Neurological:     General: No focal deficit present.     Mental Status: He is alert and oriented to person, place, and time.    ED Results / Procedures / Treatments   Labs (all labs ordered are listed, but only abnormal results are displayed) Labs Reviewed  CBC - Abnormal; Notable for the following components:      Result Value   Hemoglobin 12.9 (*)    All other components within normal limits  BASIC METABOLIC PANEL - Abnormal; Notable for the following components:   Glucose, Bld 111 (*)    All other components within normal limits  RESP PANEL BY RT-PCR (FLU A&B, COVID) ARPGX2  MAGNESIUM    EKG None  Radiology No results found.  Procedures Procedures   Medications Ordered in ED Medications - No data to display  ED Course  I have reviewed the triage vital signs and the nursing notes.  Pertinent labs & imaging results that were available during my care of the patient were reviewed by me and considered in my medical decision making (  see chart for details).  Patient here for accidental overdose of home medications.  He is afebrile, nonseptic, not ill-appearing.  Heart and lungs clear.  Abdomen soft, nontender.  No concerns for intentional overdose.   Discussed with poison control.  Patient is on Tikosyn which is high risk.  They are recommending at least 12-hour observation period, checking electrolytes here, keeping potassium, magnesium at higher end of normal as well as EKG every 4 hours.  Plan on check labs and reassess  Labs and imaging personally reviewed and interpreted:  CBC without leukocytosis  BMP with glucose 111  EKG without ischemic changes, Does have sinus bradycardia  CONSULT with family med teaching service who will evaluate patient for admission  The patient appears reasonably stabilized for admission considering the current resources, flow, and capabilities available in the ED at this time, and I doubt any other [facility] requiring further screening and/or treatment in the ED prior to admission.       MDM Rules/Calculators/A&P                           Final Clinical Impression(s) / ED Diagnoses Final diagnoses:  Accidental overdose, initial encounter  Atrial fibrillation, unspecified type [facility])    Rx / DC Orders ED Discharge Orders     None        Aliah Eriksson A, PA-C 02/18/21 1217    Lucrezia Starch, MD 02/19/21 0830

## 2021-02-18 NOTE — ED Triage Notes (Signed)
Patient coming from home, was told to come by poison control, pt accidentally took too much o his Tikosyn. Pt also took metoprolol this morning. Poison control advising approx 12 hours of observation.

## 2021-02-18 NOTE — Progress Notes (Signed)
FPTS Brief Progress Note  S:Mr. Teas is laying comfortably supine in bed. He said he is feeling well and have no complaint.   O: BP (!) 147/88 (BP Location: Left Arm)   Pulse (!) 52   Temp 98.3 F (36.8 C) (Oral)   Resp 17   Ht 6\' 2"  (1.88 m)   Wt (!) 147.8 kg   SpO2 100%   BMI 41.83 kg/m   General: Alert, NAD Respiratory: non labored breathing, clear breath sounds bilaterally   A/P: Tikosyn and Metoprolol overdose Patient is not in acute distress and denies SOB,dizziness or lightheadedness -continue cardiac monitoring  - Continue monitoring BP  Alen Bleacher, MD 02/18/2021, 10:00 PM PGY-1, [facility] Family Medicine Night Resident  Please page [phone] with questions.

## 2021-02-18 NOTE — Significant Event (Signed)
Family medicine service will be admitting this patient for observation.   FAMILY MEDICINE TEACHING SERVICE Patient - Please contact intern pager [phone] or text page via website [email or web address] ([email or web address]: [email or web address]) for questions regarding care. DO NOT page listed attending provider unless there is no answer from the number above.   Ezequiel Essex, MD PGY-2, [place] Medicine Service pager [phone]

## 2021-02-18 NOTE — Plan of Care (Signed)

## 2021-02-18 NOTE — H&P (Addendum)
[name]: [phone]  Patient name: [name] Medical record number: [id number] Date of birth: June 02, 1966 Age: 55 y.o. Gender: male  Primary Care Provider: [name], MD Consultants: None Code Status: Full Preferred Emergency Contact: Wife [name] [phone]  Chief Complaint: Accidental overdose on Tikosyn and metoprolol  Assessment and Plan: [name] is a 55 y.o. male presenting with accidental overdose of his prescription Tikosyn and metoprolol. PMH is significant for a fib, HLD, HTN, chronic back pain and GERD.  Accidental Overdose of Tikosyn and Metoprolol Pt is prescribed 500 mcg Tikosyn BID and 12.5 mg ER metoprolol qHS. He accidentally took both morning and nighttime doses this morning totaling 1000 mcg Tikosyn and 25 mg metoprolol. He called poison control and was advised to go to the ED. Per patient, he felt lightheaded on the way to the hospital and measured his pulse to be in the 50s.  Potassium 4.2, magnesium 2.2 on admission. HR of 50 bpm in ED. Poison control recommends EKGs q4h as greatest risk factors include torsades de pointes and bradycardia. They recommend a potassium >4 and Magnesium>2 and no use of medications that could cause prolonged QT. Consulted cardiology, who recommended continuing Tikosyn on schedule tonight given normal QTC along with potassium and magnesium above goal. -EKG q4h -evening and a.m. Mg  -evening and a.m. BMP  -Continuous cardiac monitoring -Contact poison control for clearance in the morning  A fib At home, patient takes Metoprolol 12.5 daily, Tikosyn 549mcg BID, and eliquis 5 mg BID.  Consulted cardiology, details above. -Continue qHS dose of Tikosyn tonight -Continue home eliquis -Hold nighttime dose of metoprolol tonight.  He can restart metoprolol tomorrow night if no longer bradycardic   HTN Hypertensive on admission with BPs 117-155/67-90.   Heart rate 50-60. -Continue home losartan  OSA on CPAP -CPAP per RT nightly  HLD -Continue home atorvastatin 20 mg every other day  Gout No complaints of gouty joint pain at this time. -Continue home colchicine 0.6 mg daily  GERD -Home med not available inpatient - Pantoprazole 40 mg daily per formulary  BPH No urinary complaints at this time. -Continue home Flomax 0.4 mg daily  FEN/GI: Heart healthy Prophylaxis: Eliquis  Disposition: Med-tele  History of Present Illness:  [name] is a 55 y.o. male presenting with accidental overdose of Tikosyn and Metoprolol.  Pt is prescribed 500 mcg Tikosyn BID and 12.5 mg ER metoprolol daily. He took his morning Tikosyn around 7:00 a.m. and accidentally took his night time medications around 9 a.m. totaling 1000 mcg Tikosyn and 25 mg metoprolol (this includes his metoprolol from the night before). He called poison control and was advised to go to the ED. Potassium and magnesium WNL and HR of 50 bpm in ED.   Started feeling light headed and bradycardic to 52-60 bpm on the way to the hospital. Some light headedness has gone away. Still feels like his heart rate is low.   Is going to GI later this week for an upper GI endoscopy. Has chest pain when bending over. Says specialist reports this may be related to acid reflux. This has been going on well before the medication overdose and does not seem to be exacerbated by the extra meds.   No alcohol, tobacco, vaping, recreational drugs.   Review Of Systems:   Review of Systems  Constitutional:  Negative for fever.  Cardiovascular:  Positive for chest pain.  Gastrointestinal:  Negative for nausea and  vomiting.    Patient Active Problem List   Diagnosis Date Noted   Overdose 02/18/2021   Chronic left-sided low back pain without sciatica 09/05/2020   Dyshydrosis 08/19/2020   Tendonitis of wrist, left 08/19/2020   Body mass index 40.0-44.9, adult ([place]) 02/10/2019   Urinary incontinence  01/02/2018   Left groin pain 01/02/2018   Allergic rhinitis 01/02/2018   Healthcare maintenance 09/12/2017   Acute nonintractable headache 02/03/2017   Abnormal stress test    Symptomatic sinus bradycardia    Left sided numbness    Muscle fasciculation    Primary osteoarthritis of right knee 07/23/2016   Plantar fasciitis of right foot 05/07/2016   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/05/2016   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   Dizziness 03/21/2015   Shortness of breath 03/21/2015   OSA on CPAP    PAF (paroxysmal atrial fibrillation) (HCC)    Palpitations    Atypical chest pain 03/03/2015   Left knee pain 10/13/2014   Atrial fibrillation ([place]) 03/14/2014   Right knee pain 11/10/2012   Sinusitis 10/23/2012   Left hip pain 06/17/2011   OSA (obstructive sleep apnea) 05/17/2011   Chronic back pain 10/29/2010   INSOMNIA 09/19/2008   Gout 05/13/2007   Hyperlipidemia 10/02/2006   Morbid obesity ([place]) 10/02/2006   Migraine 10/02/2006   HYPERTENSION, BENIGN SYSTEMIC 10/02/2006   GASTROESOPHAGEAL REFLUX, NO ESOPHAGITIS 10/02/2006    Past Medical History: Past Medical History:  Diagnosis Date   Arthritis    "knees" (01/22/2017)   Chronic lower back pain    GERD (gastroesophageal reflux disease)    Gout    "no flares in awhile" (01/22/2017)   History of MRI of cervical spine 09/25/10   normal; left shoulder normal as well   Hypertension    Migraine    "~ q 6 months" (01/22/2017)   Morbid obesity ([place])    "I've lost 43# in 15 weeks w/weight watchers" (01/22/2017)   OSA on CPAP    Paroxysmal atrial fibrillation ([place]) 7/15   chads2vasc score is 1    Past Surgical History: Past Surgical History:  Procedure Laterality Date   CARDIAC CATHETERIZATION  05/2005   "no blockage"   KNEE ARTHROSCOPY Right 02/2013   LEFT HEART CATH AND CORONARY ANGIOGRAPHY N/A 01/27/2017   Procedure: Left Heart Cath and Coronary Angiography;  Surgeon: Wayne Sine, MD;  Location: [facility] [facility];  Service: Cardiovascular;  Laterality: N/A;    Social History: Social History   Tobacco Use   Smoking status: Former    Packs/day: 1.00    Years: 10.00    Pack years: 10.00    Types: Cigarettes    Quit date: 04/02/1994    Years since quitting: 26.9   Smokeless tobacco: Former    Types: Chew   Tobacco comments:    "chewed in my teens"  Vaping Use   Vaping Use: Never used  Substance Use Topics   Alcohol use: No    Alcohol/week: 0.0 standard drinks    Comment: "stopped in 1994"   Drug use: Not Currently    Types: Marijuana    Comment: "stopped marijuna in 1994"   Additional social history: None  Please also refer to relevant sections of EMR.  Family History: Family History  Problem Relation Age of Onset   Atrial fibrillation Mother 32       had a stroke   Hypertension Father    Diabetes Father  Allergies and Medications: Allergies  Allergen Reactions   Hydrochlorothiazide-Propranolol [Propranolol-Hctz] Other (See Comments)    Joints lock up   Penicillins Other (See Comments)    Hives Makes joints stiff and unable to move Has patient had a PCN reaction causing immediate rash, facial/tongue/throat swelling, SOB or lightheadedness with hypotension: No Has patient had a PCN reaction causing severe rash involving mucus membranes or skin necrosis: No Has patient had a PCN reaction that required hospitalization No Has patient had a PCN reaction occurring within the last 10 years: No If all of the above answers are "NO", then may proceed with Cephalosporin use.   No current facility-administered medications on file prior to encounter.   Current Outpatient Medications on File Prior to Encounter  Medication Sig Dispense Refill   apixaban (ELIQUIS) 5 MG TABS tablet Take 1 tablet (5 mg total) by mouth 2 (two) times daily. 180 tablet 3   atorvastatin (LIPITOR) 20 MG tablet Take 1 tablet by mouth once daily 90 tablet 3    cetirizine (ZYRTEC) 10 MG tablet Take 1 tablet (10 mg total) by mouth daily. 30 tablet 11   clobetasol ointment (TEMOVATE) 9.56 % Apply 1 application topically 2 (two) times daily. 30 g 0   diclofenac Sodium (VOLTAREN) 1 % GEL Apply 2 g topically 4 (four) times daily. 150 g 1   diltiazem (CARDIZEM) 30 MG tablet Take 1 tablet every 4 hours AS NEEDED for elevated heart rate (Patient not taking: Reported on 02/12/2021) 30 tablet 1   dofetilide (TIKOSYN) 500 MCG capsule Take 1 capsule (500 mcg total) by mouth 2 (two) times daily. 60 capsule 12   fluticasone (FLONASE) 50 MCG/ACT nasal spray Place 2 sprays into both nostrils daily. 16 g 6   losartan (COZAAR) 50 MG tablet Take 1 tablet (50 mg total) by mouth daily. 90 tablet 0   metoprolol succinate (TOPROL XL) 25 MG 24 hr tablet Take 0.5 tablets (12.5 mg total) by mouth daily. 30 tablet 3   MITIGARE 0.6 MG CAPS Take 1 capsule by mouth once daily 90 capsule 0   Multiple Vitamin (MULTIVITAMIN WITH MINERALS) TABS tablet Take 1 tablet by mouth at bedtime.     Omega-3 Fatty Acids (FISH OIL) 1000 MG CAPS Take 2,000 mg by mouth in the morning.      omeprazole (PRILOSEC) 20 MG capsule TAKE 1 CAPSULE BY MOUTH TWICE DAILY BEFORE A MEAL 180 capsule 0   RESTASIS 0.05 % ophthalmic emulsion Place 1 drop into both eyes 2 (two) times daily.      tamsulosin (FLOMAX) 0.4 MG CAPS capsule Take 1 capsule by mouth once daily 30 capsule 3   traMADol (ULTRAM) 50 MG tablet Take 1 tablet (50 mg total) by mouth at bedtime as needed. (Patient not taking: Reported on 02/12/2021) 30 tablet 0   TURMERIC PO Take 1 capsule by mouth every other day.      XIIDRA 5 % SOLN Place 1 drop into both eyes in the morning and at bedtime.  (Patient not taking: Reported on 02/12/2021)      Objective: BP (!) 141/86   Pulse (!) 50   Temp 98 F (36.7 C) (Oral)   Resp 18   SpO2 100%  Exam: General: Patient lying comfortably in bed, no acute distress Cardiovascular: Regular rhythm, bradycardic to  50s, normal S1/S2 Respiratory: CTA bilaterally Gastrointestinal: Soft, nondistended, normal bowel sounds MSK: good bulk and tone Neuro: Alert and oriented Psych: Affect appropriate for the situation  Labs and Imaging: CBC  BMET  Recent Labs  Lab 02/18/21 1031  WBC 5.9  HGB 12.9*  HCT 40.3  PLT 213   Recent Labs  Lab 02/18/21 1031  NA 140  K 4.2  CL 106  CO2 26  BUN 11  CREATININE 0.92  GLUCOSE 111*  CALCIUM 9.1      EKG:  Sinus bradycardia with HR of 49 bpm and a 1st degree AV block (PR 214 MS), QTC 453 MS.   Precious Gilding, DO 02/18/2021, 1:38 PM PGY-1, [place] Intern pager: [phone], text pages welcome   Upper Level Addendum: I have seen and evaluated this patient along with Dr. Thompson Grayer and reviewed the above note, making necessary revisions as appropriate. These are denoted by green text. I agree with the medical decision making and physical exam as noted above. Ezequiel Essex, MD PGY-2 [facility] Family Medicine Residency

## 2021-02-18 NOTE — Discharge Instructions (Addendum)
Dear [name],  Thank you for letting us participate in your care. You were hospitalized for an accidental overdose.   POST-HOSPITAL & CARE INSTRUCTIONS We have made no changes to your medications. Please continue to take as prescribed. If needed, ask someone to help with your medications to ensure no accidental ingestions of extra doses.  Go to your follow up appointments (listed below)   DOCTOR'S APPOINTMENT   Future Appointments  Date Time Provider [place]  02/20/2021  4:30 PM Leane Platt [facility]  02/21/2021  2:30 PM Leotis Pain [facility]  02/26/2021  4:30 PM Leane Platt [facility]  02/27/2021  3:10 PM Donney Dice, DO [facility] [place]    Follow-up Information     Ganta, Anupa, DO. Go on 02/27/2021.   Specialty: Family Medicine Why: At 3:10pm. Please arrive by 2:55pm. This is your hospital follow up appointment with your primary care clinic. Contact information: [address] [place] [address] [phone]                 Take care and be well!  [facility]  [name], [name] [address] [phone]

## 2021-02-19 DIAGNOSIS — T447X1A Poisoning by beta-adrenoreceptor antagonists, accidental (unintentional), initial encounter: Secondary | ICD-10-CM | POA: Diagnosis not present

## 2021-02-19 DIAGNOSIS — I480 Paroxysmal atrial fibrillation: Secondary | ICD-10-CM | POA: Diagnosis not present

## 2021-02-19 DIAGNOSIS — I4891 Unspecified atrial fibrillation: Secondary | ICD-10-CM | POA: Diagnosis not present

## 2021-02-19 DIAGNOSIS — Z20822 Contact with and (suspected) exposure to covid-19: Secondary | ICD-10-CM | POA: Diagnosis not present

## 2021-02-19 DIAGNOSIS — T462X1A Poisoning by other antidysrhythmic drugs, accidental (unintentional), initial encounter: Secondary | ICD-10-CM | POA: Diagnosis not present

## 2021-02-19 LAB — BASIC METABOLIC PANEL
Anion gap: 7 (ref 5–15)
BUN: 12 mg/dL (ref 6–20)
CO2: 27 mmol/L (ref 22–32)
Calcium: 9 mg/dL (ref 8.9–10.3)
Chloride: 106 mmol/L (ref 98–111)
Creatinine, Ser: 0.97 mg/dL (ref 0.61–1.24)
GFR, Estimated: >60 mL/min (ref >60)
Glucose, Bld: 114 mg/dL — ABNORMAL HIGH (ref 70–99)
Potassium: 3.5 mmol/L (ref 3.5–5.1)
Sodium: 140 mmol/L (ref 135–145)

## 2021-02-19 LAB — MAGNESIUM: Magnesium: 2.3 mg/dL (ref 1.7–2.4)

## 2021-02-19 LAB — HIV ANTIBODY (ROUTINE TESTING W REFLEX): HIV Screen 4th Generation wRfx: NONREACTIVE

## 2021-02-19 MED ORDER — ATORVASTATIN CALCIUM 20 MG PO TABS: 20.0000 mg | ORAL_TABLET | ORAL | 1 refills | Status: DC

## 2021-02-19 MED ORDER — OMEPRAZOLE 20 MG PO CPDR: 20.0000 mg | DELAYED_RELEASE_CAPSULE | Freq: Every day | ORAL | 1 refills | Status: DC

## 2021-02-19 MED ORDER — DICLOFENAC SODIUM 1 % EX GEL: 2.0000 g | Freq: Every day | CUTANEOUS | 1 refills | Status: DC | PRN

## 2021-02-19 MED ORDER — POTASSIUM CHLORIDE 20 MEQ PO PACK
60.0000 meq | PACK | Freq: Once | ORAL | Status: AC
  Administered 2021-02-19: 60 meq via ORAL
  Filled 2021-02-19: qty 3

## 2021-02-19 NOTE — Progress Notes (Signed)
Pt is alert and oriented. Discharge instructions/ AVS given to pt. 

## 2021-02-19 NOTE — Discharge Summary (Addendum)
[facility] Discharge Summary  Patient name: [name] Medical record number: [id number] Date of birth: 1965/10/21 Age: 55 y.o. Gender: male Date of Admission: 02/18/2021  Date of Discharge: 02/19/2021 Admitting Physician: Precious Gilding, DO  Primary Care Provider: [name], MD Consultants: Cardiology  Indication for Hospitalization: Accidental overdose of medication  Discharge Diagnoses/Problem List:  Active Problems:   Overdose  Disposition: home  Discharge Condition: stable  Discharge Exam:  General: Pt laying supine in bed, NAD Cardio: bradycardia, normal rhythm, normal S1/S2, no murmurs Lungs: CTA bilaterally Neuro: alert and oriented  Brief Hospital Course:  [name] is a 55 y.o. male presenting with accidental overdose of his prescription Tikosyn and metoprolol. PMH is significant for a fib, HLD, HTN, chronic back pain and GERD.   Accidental Overdose of Tikosyn and Metoprolol Pt is prescribed 500 mcg Tikosyn BID and 12.5 mg ER metoprolol qHS. He accidentally took both morning and nighttime doses on 02/18/21 totaling 1000 mcg Tikosyn and 25 mg metoprolol. He called poison control and was advised to go to the ED. Per patient, he felt lightheaded on the way to the hospital and measured his pulse to be in the 50s.  Potassium 4.2, magnesium 2.2 on admission. HR of 50 bpm in ED. Poison control and cardiology consulted. Serial EKGs demonstrated normal Qtc. Magnesium stayed above goal. Potassium repletion required once for K of 3.6. Normal home tikosyn regimen restarted 7/17 qHS per cardiology. Metoprolol held during admission for bradycardia; patient to resume evening of 7/18. Patient asymptomatic with HR between 48-64 during his stay. Close PCP follow up scheduled.    Issues for Follow Up:  Pt should follow up with PCP regarding bradycardia which was noted throughout the hospitalization   Significant Labs and Imaging:  Recent Labs  Lab  02/18/21 1031  WBC 5.9  HGB 12.9*  HCT 40.3  PLT 213   Recent Labs  Lab 02/18/21 1031 02/18/21 1430 02/19/21 0147  NA 140 141 140  K 4.2 4.1 3.5  CL 106 109 106  CO2 26 26 27   GLUCOSE 111* 121* 114*  BUN 11 8 12   CREATININE 0.92 0.95 0.97  CALCIUM 9.1 9.2 9.0  MG 2.2 2.1 2.3    Discharge Medications:  Allergies as of 02/19/2021       Reactions   Hydrochlorothiazide-propranolol [propranolol-hctz] Other (See Comments)   Joints lock up   Penicillins Other (See Comments)   Hives Makes joints stiff and unable to move Has patient had a PCN reaction causing immediate rash, facial/tongue/throat swelling, SOB or lightheadedness with hypotension: No Has patient had a PCN reaction causing severe rash involving mucus membranes or skin necrosis: No Has patient had a PCN reaction that required hospitalization No Has patient had a PCN reaction occurring within the last 10 years: No If all of the above answers are "NO", then may proceed with Cephalosporin use.        Medication List     TAKE these medications    atorvastatin 20 MG tablet Commonly known as: LIPITOR Take 1 tablet (20 mg total) by mouth every other day.   diclofenac Sodium 1 % Gel Commonly known as: Voltaren Apply 2 g topically daily as needed (For pain).   dofetilide 500 MCG capsule Commonly known as: TIKOSYN Take 1 capsule (500 mcg total) by mouth 2 (two) times daily.   Eliquis 5 MG Tabs tablet Generic drug: apixaban Take 1 tablet (5 mg total) by mouth 2 (two) times daily.  Fish Oil 1000 MG Caps Take 2,000 mg by mouth in the morning.   fluticasone 50 MCG/ACT nasal spray Commonly known as: FLONASE Place 2 sprays into both nostrils daily. What changed:  when to take this reasons to take this   losartan 50 MG tablet Commonly known as: COZAAR Take 1 tablet (50 mg total) by mouth daily.   metoprolol succinate 25 MG 24 hr tablet Commonly known as: Toprol XL Take 0.5 tablets (12.5 mg total) by  mouth daily.   Mitigare 0.6 MG Caps Generic drug: Colchicine Take 1 capsule by mouth once daily   multivitamin with minerals Tabs tablet Take 1 tablet by mouth at bedtime.   omeprazole 20 MG capsule Commonly known as: PRILOSEC Take 1 capsule (20 mg total) by mouth daily. What changed: See the new instructions.   tamsulosin 0.4 MG Caps capsule Commonly known as: FLOMAX Take 1 capsule by mouth once daily   TURMERIC PO Take 1 capsule by mouth every other day.        Discharge Instructions: Please refer to Patient Instructions section of EMR for full details.  Patient was counseled important signs and symptoms that should prompt return to medical care, changes in medications, dietary instructions, activity restrictions, and follow up appointments.   Follow-Up Appointments:  Follow-up Information     Ganta, Anupa, DO. Go on 02/27/2021.   Specialty: Family Medicine Why: At 3:10pm. Please arrive by 2:55pm. This is your hospital follow up appointment with your primary care clinic. Contact information: [place] [address] [phone]                 Precious Gilding, DO 02/19/2021, 2:36 PM PGY-1, [facility]   Upper Level Addendum:  I agree with Dr. Ronnald Ramp and reviewed the above note, making necessary revisions as appropriate.  I agree with the medical decision making and physical exam as noted above.  Lurline Del, DO PGY-3 [facility]

## 2021-02-19 NOTE — Hospital Course (Addendum)
[name] is a 55 y.o. male presenting with accidental overdose of his prescription Tikosyn and metoprolol. PMH is significant for a fib, HLD, HTN, chronic back pain and GERD.   Accidental Overdose of Tikosyn and Metoprolol Pt is prescribed 500 mcg Tikosyn BID and 12.5 mg ER metoprolol qHS. He accidentally took both morning and nighttime doses on 02/18/21 totaling 1000 mcg Tikosyn and 25 mg metoprolol. He called poison control and was advised to go to the ED. Per patient, he felt lightheaded on the way to the hospital and measured his pulse to be in the 50s.  Potassium 4.2, magnesium 2.2 on admission. HR of 50 bpm in ED. Poison control and cardiology consulted. Serial EKGs demonstrated normal Qtc. Magnesium stayed above goal. Potassium repletion required once for K of 3.6. Normal home tikosyn regimen restarted 7/17 qHS per cardiology. Metoprolol held during admission for bradycardia; patient to resume evening of 7/18. Patient asymptomatic with HR between 46-64 during his stay. Close PCP follow up scheduled.

## 2021-02-20 ENCOUNTER — Ambulatory Visit: Payer: 59

## 2021-02-21 ENCOUNTER — Other Ambulatory Visit: Payer: Self-pay

## 2021-02-21 ENCOUNTER — Ambulatory Visit (INDEPENDENT_AMBULATORY_CARE_PROVIDER_SITE_OTHER)
Admission: RE | Admit: 2021-02-21 | Discharge: 2021-02-21 | Disposition: A | Discharge status: Home / Self Care | Payer: 59 | Source: Ambulatory Visit | Attending: Physician Assistant | Admitting: Physician Assistant

## 2021-02-21 ENCOUNTER — Ambulatory Visit: Payer: 59 | Admitting: Physician Assistant

## 2021-02-21 ENCOUNTER — Encounter: Payer: Self-pay | Admitting: Physician Assistant

## 2021-02-21 ENCOUNTER — Telehealth: Payer: Self-pay

## 2021-02-21 VITALS — BP 140/80 | HR 62 | Ht 74.0 in | Wt 324.0 lb

## 2021-02-21 DIAGNOSIS — R1310 Dysphagia, unspecified: Secondary | ICD-10-CM

## 2021-02-21 DIAGNOSIS — R079 Chest pain, unspecified: Secondary | ICD-10-CM

## 2021-02-21 DIAGNOSIS — K219 Gastro-esophageal reflux disease without esophagitis: Secondary | ICD-10-CM

## 2021-02-21 DIAGNOSIS — Z1211 Encounter for screening for malignant neoplasm of colon: Secondary | ICD-10-CM | POA: Diagnosis not present

## 2021-02-21 MED ORDER — OMEPRAZOLE 20 MG PO CPDR
20.0000 mg | DELAYED_RELEASE_CAPSULE | Freq: Two times a day (BID) | ORAL | 1 refills | Status: DC
Start: 2021-02-21 — End: 2021-03-19

## 2021-02-21 MED ORDER — PLENVU 140 G PO SOLR: 1.0000 | ORAL | 0 refills | Status: DC

## 2021-02-21 NOTE — Progress Notes (Signed)
Subjective:    Patient ID: [name], male    DOB: 1966/04/06, 55 y.o.   MRN: [id number]  HPI [name] is a pleasant 55 year old white male, new to GI today, referred by Chrisandra Netters, MD for evaluation of atypical chest pain and GERD symptoms. Patient has not had any prior GI evaluation. He has history of hypertension, atrial fibrillation for which she is on Eliquis, sleep apnea with CPAP use, degenerative joint disease, morbid obesity with BMI 41, history of gout. He says he has had reflux symptoms over the past 15 to 20 years and has been on omeprazole 20 mg p.o. every morning over the past 15 years.  He feels that this does help to control heartburn and indigestion though he continues to have sensation of a scratchy throat all the time.  He also relates intermittent dysphagia to solid food.  He can usually drink liquids to help push the food down, no episodes of regurgitation, no symptoms with liquids. He has had problems with the atypical chest pain over the past couple of years.  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  His chest pain symptoms are exacerbated by leaning forward and then standing back up.  He also feels more discomfort if he leans back in a chair and usually feels better sitting straight up. He is followed by Dr. Nahser/cardiology, his chest pain is not felt to be of cardiac etiology.  He had a low risk cardiac CT March 2022. He did have a very recent brief admission 7/17 to 02/19/2021 after unintentional overdose of Tikosyn and metoprolol.  He was monitored overnight without any adverse effects, and discharged the following day.Marland Kitchen He said he had been tried on tramadol for the chest pain but this caused headaches/migraine and  did not continue.  He has not found Tylenol to be helpful..  No recent chest x-ray.  Patient also mentions that he was sent a Cologuard kit which she initially did not complete and is expecting a replacement kit in the mail. Family history is negative for colon cancer and polyps, no lower GI complaints today.   Review of Systems Pertinent positive and negative review of systems were noted in the above HPI section.  All other review of systems was otherwise negative.   Outpatient Encounter Medications as of 02/21/2021  Medication Sig   apixaban (ELIQUIS) 5 MG TABS tablet Take 1 tablet (5 mg total) by mouth 2 (two) times daily.   atorvastatin (LIPITOR) 20 MG tablet Take 1 tablet (20 mg total) by mouth every other day.   diclofenac Sodium (VOLTAREN) 1 % GEL Apply 2 g topically daily as needed (For pain).   dofetilide (TIKOSYN) 500 MCG capsule Take 1 capsule (500 mcg total) by mouth 2 (two) times daily.   fluticasone (FLONASE) 50 MCG/ACT nasal spray Place 2 sprays into both nostrils daily.   losartan (COZAAR) 50 MG tablet Take 1 tablet (50 mg total) by mouth daily.   metoprolol succinate (TOPROL XL) 25 MG 24 hr tablet Take 0.5 tablets (12.5 mg total) by mouth daily.   MITIGARE 0.6 MG CAPS Take 1 capsule by mouth once daily   Multiple Vitamin (MULTIVITAMIN WITH MINERALS) TABS tablet Take  1 tablet by mouth at bedtime.   Omega-3 Fatty Acids (FISH OIL) 1000 MG CAPS Take 2,000 mg by mouth in the morning.    PEG-KCl-NaCl-NaSulf-Na Asc-C (PLENVU) 140 g SOLR Take 1 kit by mouth as directed. Manufacturer's coupon Universal coupon code:BIN: P2366821; GROUP: [id number]; PCN: [id number]; ID: [id number]; PAY NO MORE $50; NO prior authorization   tamsulosin (FLOMAX) 0.4 MG CAPS capsule Take 1 capsule by mouth once daily   TURMERIC PO Take 1 capsule by mouth every other day.    [DISCONTINUED] omeprazole (PRILOSEC) 20 MG capsule Take 1 capsule (20 mg total) by mouth daily.   omeprazole (PRILOSEC) 20 MG capsule Take 1  capsule (20 mg total) by mouth 2 (two) times daily before a meal.   No facility-administered encounter medications on file as of 02/21/2021.   Allergies  Allergen Reactions   Hydrochlorothiazide-Propranolol [Propranolol-Hctz] Other (See Comments)    Joints lock up   Penicillins Other (See Comments)    Hives Makes joints stiff and unable to move Has patient had a PCN reaction causing immediate rash, facial/tongue/throat swelling, SOB or lightheadedness with hypotension: No Has patient had a PCN reaction causing severe rash involving mucus membranes or skin necrosis: No Has patient had a PCN reaction that required hospitalization No Has patient had a PCN reaction occurring within the last 10 years: No If all of the above answers are "NO", then may proceed with Cephalosporin use.   Patient Active Problem List   Diagnosis Date Noted   Overdose 02/18/2021   Chronic left-sided low back pain without sciatica 09/05/2020   Dyshydrosis 08/19/2020   Tendonitis of wrist, left 08/19/2020   Body mass index 40.0-44.9, adult ([place]) 02/10/2019   Urinary incontinence 01/02/2018   Left groin pain 01/02/2018   Allergic rhinitis 01/02/2018   Healthcare maintenance 09/12/2017   Acute nonintractable headache 02/03/2017   Abnormal stress test    Symptomatic sinus bradycardia    Left sided numbness    Muscle fasciculation    Primary osteoarthritis of right knee 07/23/2016   Plantar fasciitis of right foot 05/07/2016   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/05/2016   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   Dizziness 03/21/2015   Shortness of breath 03/21/2015   OSA on CPAP    PAF (paroxysmal atrial fibrillation) (HCC)    Palpitations    Atypical chest pain 03/03/2015   Left knee pain 10/13/2014   Atrial fibrillation ([place]) 03/14/2014   Right knee pain 11/10/2012   Sinusitis 10/23/2012   Left hip pain 06/17/2011   OSA (obstructive sleep apnea) 05/17/2011    Chronic back pain 10/29/2010   INSOMNIA 09/19/2008   Gout 05/13/2007   Hyperlipidemia 10/02/2006   Morbid obesity ([place]) 10/02/2006   Migraine 10/02/2006   HYPERTENSION, BENIGN SYSTEMIC 10/02/2006   GASTROESOPHAGEAL REFLUX, NO ESOPHAGITIS 10/02/2006   Social History   Socioeconomic History   Marital status: Married    Spouse name: Not on file   Number of children: 3   Years of education: Not on file   Highest education level: Not on file  Occupational History   Not on file  Tobacco Use   Smoking status: Former    Packs/day: 1.00    Years: 10.00    Pack years: 10.00    Types: Cigarettes    Quit date: 04/02/1994    Years since quitting: 26.9   Smokeless tobacco: Former    Types: Chew   Tobacco comments:    "chewed in my teens"  Vaping Use   Vaping Use: Never used  Substance and Sexual Activity   Alcohol use: No    Alcohol/week: 0.0 standard drinks    Comment: "stopped in 1994"   Drug use: Not Currently    Types: Marijuana    Comment: "stopped marijuna in 1994"   Sexual activity: Yes  Other Topics Concern   Not on file  Social History Narrative   Lives in [place] with spouse.  3 children, all grown.   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 [name]'s family history includes Atrial fibrillation (age of onset: 67) in his mother; Diabetes in his father; Hypertension in his father.      Objective:    Vitals:   02/21/21 1442  BP: 140/80  Pulse: 62    Physical Exam Well-developed obese white male in no acute distress.  Pleasant  ,Weight, 324 BMI 41.6  HEENT; nontraumatic normocephalic, EOMI, PE R LA, sclera anicteric. Oropharynx; not examined today Neck; supple, no  JVD Cardiovascular; regular rate and rhythm with S1-S2, no murmur rub or gallop Pulmonary; Clear bilaterally-he is quite tender to palpation of the sternum and anterior ribs bilaterally Abdomen; soft, obese nontender, nondistended, no palpable mass or hepatosplenomegaly, bowel sounds are active Rectal; not done today Skin; benign exam, no jaundice rash or appreciable lesions Extremities; no clubbing cyanosis or edema skin warm and dry Neuro/Psych; alert and oriented x4, grossly nonfocal mood and affect appropriate        Assessment & Plan:   #79 55 year old white male with atypical chest pain present off and on over the past couple years but really recent exacerbation about 2 months ago after an episode of hard coughing at work.  He relates that ever since that time his chest and sternum will hurt with coughing.  Also has positional exacerbation with leaning back in a chair and also leaning forward and then trying to stand back up. He is quite tender to palpation of the sternum and anterior ribs bilaterally on exam. I think his chest pain symptoms are musculoskeletal, rule out costochondritis, rule out strain or possible rib fracture due to hard coughing  #2 chronic GERD, on low-dose PPI therapy x15 years #3 intermittent solid food dysphagia rule out stricture, rule out esophageal ring #4 colon cancer screening-no prior colonoscopy-patient awaiting a Cologuard test currently which she has not completed  #5 atrial fibrillation-on chronic Eliquis #6 morbid obesity BMI 41.6 #7.  History of gout #8.  Hypertension #9 degenerative joint disease  Plan; Patient will be scheduled for EGD with possible esophageal dilation and also for colonoscopy with Dr. Tarri Glenn.  Both procedures were discussed in detail with the patient including indications risk and benefits and he is agreeable to proceed. He was told that he did not need to complete the Cologuard since we are proceeding with  colonoscopy Patient will need to hold Eliquis for 2 days prior to procedures.  We will communicate with his cardiologist Dr. Acie Fredrickson to assure this is reasonable for this patient. Increase omeprazole to 20 mg p.o. twice daily AC breakfast and AC dinner Will check chest x-ray and rib details Trial of over-the-counter lidocaine 4% gel or cream  for chest wall pain 2-3 times daily. Hesitate to put him on NSAIDs with Eliquis use.   Averee Harb Genia Harold PA-C 02/21/2021   Cc: [name], MD

## 2021-02-21 NOTE — Telephone Encounter (Signed)
[facility] Pre-operative Risk Assessment     Request for surgical clearance:     Endoscopy Procedure  What type of surgery is being performed?     Colonoscopy  When is this surgery scheduled?     03/12/2021  What type of clearance is required ?   Pharmacy  Are there any medications that need to be held prior to surgery and how long? Eliquis starting 2 days prior  Practice name and name of physician performing surgery?      [facility]  What is your office phone and fax number?      Phone- [phone]  [phone]  Anesthesia type (None, local, MAC, general) ?       MAC

## 2021-02-21 NOTE — Patient Instructions (Signed)
If you are age 57 or younger, your body mass index should be between 19-25. Your Body mass index is 41.6 kg/m. If this is out of the aformentioned range listed, please consider follow up with your Primary Care Provider.  __________________________________________________________  The [facility] providers would like to encourage you to use [facility] to communicate with providers for non-urgent requests or questions.  Due to long hold times on the telephone, sending your provider a message by [facility] may be a faster and more efficient way to get a response.  Please allow 48 business hours for a response.  Please remember that this is for non-urgent requests.   Your provider has requested that you go to the basement level for lab for a chest x-ray before leaving today. Press "B" on the elevator. The imaging is located straight ahead as you exit the elevator.  You will be contacted by our office prior to your procedure for directions on holding your Eliquis.  If you do not hear from our office 1 week prior to your scheduled procedure, please call [phone] to discuss.   Continue Omeprazole 20 mg, increase to twice daily before breakfast and dinner.  Follow up pending for now.  Thank you for entrusting me with your care and choosing [facility].  Amy Esterwood, PA-C

## 2021-02-23 NOTE — Progress Notes (Signed)
Reviewed and agree with management plans. Procedures planned for 03/22/21. May have availability to have the procedures performed earlier if his schedule allows.   Majesty Stehlin L. Tarri Glenn, MD, MPH

## 2021-02-23 NOTE — Telephone Encounter (Signed)
Patient with diagnosis of afib on Eliquis for anticoagulation.    Procedure: colonoscopy Date of procedure: 03/12/21  CHA2DS2-VASc Score = 2  This indicates a 2.2% annual risk of stroke. The patient's score is based upon: CHF History: No HTN History: Yes Diabetes History: No Stroke History: No Vascular Disease History: Yes Age Score: 0 Gender Score: 0     CrCl 130 ml/min  Per office protocol, patient can hold Eliquis for 2 days prior to procedure.

## 2021-02-23 NOTE — Progress Notes (Signed)
Noted! Thank you

## 2021-02-26 ENCOUNTER — Other Ambulatory Visit: Payer: Self-pay

## 2021-02-26 ENCOUNTER — Ambulatory Visit: Payer: 59

## 2021-02-26 DIAGNOSIS — M545 Low back pain, unspecified: Secondary | ICD-10-CM | POA: Diagnosis not present

## 2021-02-26 DIAGNOSIS — M6281 Muscle weakness (generalized): Secondary | ICD-10-CM

## 2021-02-26 DIAGNOSIS — R252 Cramp and spasm: Secondary | ICD-10-CM

## 2021-02-26 DIAGNOSIS — G8929 Other chronic pain: Secondary | ICD-10-CM

## 2021-02-26 NOTE — Telephone Encounter (Signed)
Left voicemail for patient to call back about stopping his Eliquis.

## 2021-02-26 NOTE — Therapy (Signed)
[place], [place], [address] Phone: [phone]   Fax:  [phone]  Physical Therapy Treatment  Patient Details  Name: [name] MRN: [id number] Date of Birth: 29-Apr-1966 Referring Provider (PT): Dawley, Theodoro Doing, DO   Encounter Date: 02/26/2021   PT End of Session - 02/26/21 1622     Visit Number 2    Number of Visits 8    Date for PT Re-Evaluation 04/09/21    Authorization Type U[employer]    PT Start Time 1622    PT Stop Time 1708    PT Time Calculation (min) 46 min    Activity Tolerance Patient tolerated treatment well    Behavior During Therapy [facility] for tasks assessed/performed             Past Medical History:  Diagnosis Date   Arthritis    "knees" (01/22/2017)   Cataract    Chronic lower back pain    GERD (gastroesophageal reflux disease)    Gout    "no flares in awhile" (01/22/2017)   History of MRI of cervical spine 09/25/2010   normal; left shoulder normal as well   Hypertension    Migraine    "~ q 6 months" (01/22/2017)   Morbid obesity ([place])    "I've lost 43# in 15 weeks w/weight watchers" (01/22/2017)   OSA on CPAP    Paroxysmal atrial fibrillation ([place]) 02/2014   chads2vasc score is 1    Past Surgical History:  Procedure Laterality Date   CARDIAC CATHETERIZATION  05/2005   "no blockage"   KNEE ARTHROSCOPY Right 02/2013   LEFT HEART CATH AND CORONARY ANGIOGRAPHY N/A 01/27/2017   Procedure: Left Heart Cath and Coronary Angiography;  Surgeon: Troy Sine, MD;  Location: [place] [facility];  Service: Cardiovascular;  Laterality: N/A;    There were no vitals filed for this visit.   Subjective Assessment - 02/26/21 1626     Subjective The patient reports that he had been going to the gym, but has not gone this week due to his wife being in a car accident.    Limitations Sitting;Lifting;Standing    Currently in Pain? Yes    Pain Score 2     Pain Location Back    Pain  Orientation Right                [facility] PT Assessment - 02/26/21 0001       Assessment   Medical Diagnosis M47.816 (ICD-10-CM) - Spondylosis without myelopathy or radiculopathy, lumbar region    Referring Provider (PT) Dawley, Theodoro Doing, DO                           [facility] Adult PT Treatment/Exercise - 02/26/21 0001       Exercises   Exercises Knee/Hip;Lumbar      Lumbar Exercises: Stretches   Passive Hamstring Stretch 20 seconds;3 reps;Left;Right    Hip Flexor Stretch 2 reps;Left;Right    Hip Flexor Stretch Limitations supine    Pelvic Tilt 15 reps;5 seconds    Other Lumbar Stretch Exercise FF w SB 20" x 3    Other Lumbar Stretch Exercise SKTC 20" x 3 each      Lumbar Exercises: Seated   Other Seated Lumbar Exercises Sitting rhythmic stab w/ black cord x 5'      Lumbar Exercises: Supine   Heel Slides 10 reps    Heel Slides Limitations unilateral w/  PPT    Bridge 15 reps    Bridge Limitations w/ SB    Large Ball Abdominal Isometric 10 reps;5 seconds    Other Supine Lumbar Exercises LE heel slides w/ SB x 20      Knee/Hip Exercises: Aerobic   Nustep L6 x 5 minutes                      PT Short Term Goals - 02/12/21 1727       PT SHORT TERM GOAL #1   Title He will be indpendent with initial HEP    Time 2    Period Weeks    Target Date 02/26/21               PT Long Term Goals - 02/12/21 1727       PT LONG TERM GOAL #1   Title The patient will have an improved FOTO score of 59% or better.    Baseline 55%    Time 8    Period Weeks    Target Date 04/09/21      PT LONG TERM GOAL #2   Title The patient will report pain at the end of the day at a 4/10 or less    Baseline 8-9/10    Time 8    Period Weeks    Target Date 04/09/21      PT LONG TERM GOAL #3   Title The patient will present with improvement of lumbar flexor strength to 4+/5 for lifting    Baseline 3+/5    Time 8    Period Weeks    Target Date 04/09/21       PT LONG TERM GOAL #4   Title The patient will be independent with an advanced HEP at the gym for back and LE.    Baseline LE only    Time 8    Period Weeks    Status New    Target Date 04/09/21      PT LONG TERM GOAL #5   Title The patient will be able to sit for 30 minutes without left leg numbness/tingling and pain <3/10.    Baseline 5 minutes    Time 8    Period Weeks    Target Date 04/09/21                   Plan - 02/26/21 1622     Clinical Impression Statement [name] reports missing last week due to going the hospital as he took doubles his medication on accident.  The patient reported 2/10 right sided low back pain today.  He did report pain going into the left thigh with being on the NuStep fro 5 minutes.  Progressed core stabilization exercises in sitting and in supine.  He tolerated them well.  Recommend continued therapy for progression of core stabilization for home and work activities.    Personal Factors and Comorbidities Comorbidity 2    Comorbidities overweight,  knee OA    Examination-Activity Limitations Bend;Carry;Lift;Stand;Sit    Examination-Participation Restrictions Cleaning;Karie Georges    PT Treatment/Interventions ADLs/Self Care Home Management;Electrical Stimulation;Gait training;Stair training;Functional mobility training;Cryotherapy;Moist Heat;Therapeutic activities;Therapeutic exercise;Balance training;Neuromuscular re-education;Patient/family education;Manual techniques;Taping;Spinal Manipulations;Joint Manipulations    PT Next Visit Plan hip flexor stretches, progress core stabilization, quadriceps/gluteal strengthening.    PT Home Exercise Plan Access Code: [email or web address]    Consulted and Agree with Plan of Care Patient             Patient  will benefit from skilled therapeutic intervention in order to improve the following deficits and impairments:  Abnormal gait, Postural dysfunction, Decreased strength, Impaired  flexibility, Pain  Visit Diagnosis: Chronic left-sided low back pain without sciatica  Muscle weakness (generalized)  Cramp and spasm     Problem List Patient Active Problem List   Diagnosis Date Noted   Overdose 02/18/2021   Chronic left-sided low back pain without sciatica 09/05/2020   Dyshydrosis 08/19/2020   Tendonitis of wrist, left 08/19/2020   Body mass index 40.0-44.9, adult ([place]) 02/10/2019   Urinary incontinence 01/02/2018   Left groin pain 01/02/2018   Allergic rhinitis 01/02/2018   Healthcare maintenance 09/12/2017   Acute nonintractable headache 02/03/2017   Abnormal stress test    Symptomatic sinus bradycardia    Left sided numbness    Muscle fasciculation    Primary osteoarthritis of right knee 07/23/2016   Plantar fasciitis of right foot 05/07/2016   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/05/2016   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   Dizziness 03/21/2015   Shortness of breath 03/21/2015   OSA on CPAP    PAF (paroxysmal atrial fibrillation) ([facility])    Palpitations    Atypical chest pain 03/03/2015   Left knee pain 10/13/2014   Atrial fibrillation ([place]) 03/14/2014   Right knee pain 11/10/2012   Sinusitis 10/23/2012   Left hip pain 06/17/2011   OSA (obstructive sleep apnea) 05/17/2011   Chronic back pain 10/29/2010   INSOMNIA 09/19/2008   Gout 05/13/2007   Hyperlipidemia 10/02/2006   Morbid obesity ([place]) 10/02/2006   Migraine 10/02/2006   HYPERTENSION, BENIGN SYSTEMIC 10/02/2006   GASTROESOPHAGEAL REFLUX, NO ESOPHAGITIS 10/02/2006   Rich Number, PT, DPT, OCS, Crt. DN  Bethena Midget 02/26/2021, 5:14 PM  [facility] [address] [place], [place], [address] Phone: [phone]   Fax:  [phone]  Name: [name] MRN: [id number] Date of Birth: [birth year]

## 2021-02-26 NOTE — Telephone Encounter (Signed)
   Primary Cardiologist: Mertie Moores, MD  Chart reviewed as part of pre-operative protocol coverage by clinical pharmacist..  Celedonio Miyamoto received the following recommendations:  Patient with diagnosis of afib on Eliquis for anticoagulation.     Procedure: colonoscopy Date of procedure: 03/12/21   CHA2DS2-VASc Score = 2  This indicates a 2.2% annual risk of stroke. The patient's score is based upon: CHF History: No HTN History: Yes Diabetes History: No Stroke History: No Vascular Disease History: Yes Age Score: 0 Gender Score: 0      CrCl 130 ml/min   Per office protocol, patient can hold Eliquis for 2 days prior to procedure.  I will route this recommendation to the requesting party via Epic fax function and remove from pre-op pool.  Please call with questions.  Jossie Ng. Astria Jordahl NP-C    02/26/2021, 9:14 AM [place] [address] Office [phone] Fax [phone]

## 2021-02-27 ENCOUNTER — Ambulatory Visit: Payer: 59 | Admitting: Family Medicine

## 2021-02-27 NOTE — Telephone Encounter (Signed)
Left message to return call 

## 2021-02-28 NOTE — Telephone Encounter (Signed)
Patient returned call. Let him know to stop Eliquis 2 days before. Last dose being 8/5. Expressed understanding.

## 2021-02-28 NOTE — Telephone Encounter (Signed)
Patient has returned phone call and has been notified about holding Eliquis

## 2021-02-28 NOTE — Telephone Encounter (Signed)
Message has been sent through MyChart.

## 2021-03-11 ENCOUNTER — Other Ambulatory Visit: Payer: Self-pay | Admitting: Family Medicine

## 2021-03-12 ENCOUNTER — Encounter: Payer: Self-pay | Admitting: Gastroenterology

## 2021-03-12 ENCOUNTER — Other Ambulatory Visit: Payer: Self-pay

## 2021-03-12 ENCOUNTER — Ambulatory Visit (AMBULATORY_SURGERY_CENTER): Payer: 59 | Admitting: Gastroenterology

## 2021-03-12 VITALS — BP 129/77 | HR 61 | Temp 98.4°F | Resp 18 | Ht 74.0 in | Wt 324.0 lb

## 2021-03-12 DIAGNOSIS — R1310 Dysphagia, unspecified: Secondary | ICD-10-CM

## 2021-03-12 DIAGNOSIS — D120 Benign neoplasm of cecum: Secondary | ICD-10-CM | POA: Diagnosis not present

## 2021-03-12 DIAGNOSIS — D122 Benign neoplasm of ascending colon: Secondary | ICD-10-CM

## 2021-03-12 DIAGNOSIS — Z1211 Encounter for screening for malignant neoplasm of colon: Secondary | ICD-10-CM

## 2021-03-12 DIAGNOSIS — K2970 Gastritis, unspecified, without bleeding: Secondary | ICD-10-CM | POA: Diagnosis not present

## 2021-03-12 DIAGNOSIS — K2950 Unspecified chronic gastritis without bleeding: Secondary | ICD-10-CM | POA: Diagnosis not present

## 2021-03-12 DIAGNOSIS — K219 Gastro-esophageal reflux disease without esophagitis: Secondary | ICD-10-CM

## 2021-03-12 DIAGNOSIS — K208 Other esophagitis without bleeding: Secondary | ICD-10-CM | POA: Diagnosis not present

## 2021-03-12 DIAGNOSIS — D123 Benign neoplasm of transverse colon: Secondary | ICD-10-CM | POA: Diagnosis not present

## 2021-03-12 MED ORDER — SODIUM CHLORIDE 0.9 % IV SOLN
500.0000 mL | INTRAVENOUS | Status: DC
Start: 2021-03-12 — End: 2021-03-12

## 2021-03-12 NOTE — Progress Notes (Signed)
Pt Drowsy. VSS. To PACU, report to RN. No anesthetic complications noted.  

## 2021-03-12 NOTE — Progress Notes (Signed)
Called to room to assist during endoscopic procedure.  Patient ID and intended procedure confirmed with present staff. Received instructions for my participation in the procedure from the performing physician.  

## 2021-03-12 NOTE — Op Note (Signed)
[place] Patient Name: [name] Procedure Date: 03/12/2021 1:30 PM MRN: [id number] Endoscopist: Thornton Park MD, MD Age: 55 Referring MD:  Date of Birth: [birth year] Gender: Male Account #: [id number] Procedure:                Upper GI endoscopy Indications:              Dysphagia, Atypical chest pain, chronic GERD, on                            low-dose PPI therapy x15 years Medicines:                Monitored Anesthesia Care Procedure:                Pre-Anesthesia Assessment:                           - Prior to the procedure, a History and Physical                            was performed, and patient medications and                            allergies were reviewed. The patient's tolerance of                            previous anesthesia was also reviewed. The risks                            and benefits of the procedure and the sedation                            options and risks were discussed with the patient.                            All questions were answered, and informed consent                            was obtained. Prior Anticoagulants: The patient has                            taken Eliquis (apixaban), last dose was 3 days                            prior to procedure. ASA Grade Assessment: III - A                            patient with severe systemic disease. After                            reviewing the risks and benefits, the patient was                            deemed in satisfactory condition to undergo the  procedure.                           After obtaining informed consent, the endoscope was                            passed under direct vision. Throughout the                            procedure, the patient's blood pressure, pulse, and                            oxygen saturations were monitored continuously. The                            GIF [id number] was introduced through the                             mouth, and advanced to the third part of duodenum.                            The upper GI endoscopy was accomplished without                            difficulty. The patient tolerated the procedure                            well. Scope In: Scope Out: Findings:                 No endoscopic abnormality was evident in the                            esophagus to explain the patient's complaint of                            dysphagia. The z-line was located 40 cm from the                            incisors. Biopsies were obtained from the                            mid/proximal and distal esophagus with cold forceps                            for histology of suspected eosinophilic esophagitis.                           The entire examined stomach was normal. Biopsies                            were taken from the antrum, body, and fundus with a                            cold forceps for histology. Estimated  blood loss                            was minimal.                           The examined duodenum was normal.                           The cardia and gastric fundus were normal on                            retroflexion.                           The exam was otherwise without abnormality. Complications:            No immediate complications. Estimated blood loss:                            Minimal. Estimated Blood Loss:     Estimated blood loss was minimal. Impression:               - No endoscopic esophageal abnormality to explain                            patient's dysphagia. Biopsied.                           - Normal stomach. Biopsied.                           - Normal examined duodenum.                           - The examination was otherwise normal. Recommendation:           - Patient has a contact number available for                            emergencies. The signs and symptoms of potential                            delayed complications were discussed  with the                            patient. Return to normal activities tomorrow.                            Written discharge instructions were provided to the                            patient.                           - Resume previous diet.                           - Continue present medications.                           -  Await pathology results.                           - Plan esophagram if biopsy results are normal.                           - Proceed with colonoscopy as previously planned. Thornton Park MD, MD 03/12/2021 1:51:16 PM This report has been signed electronically.

## 2021-03-12 NOTE — Op Note (Signed)
[place] Patient Name: [name] Procedure Date: 03/12/2021 1:29 PM MRN: [id number] Endoscopist: Thornton Park MD, MD Age: 55 Referring MD:  Date of Birth: Jun 14, 1966 Gender: Male Account #: [id number] Procedure:                Colonoscopy Indications:              Screening for colorectal malignant neoplasm, This                            is the patient's first colonoscopy Medicines:                Monitored Anesthesia Care Procedure:                Pre-Anesthesia Assessment:                           - Prior to the procedure, a History and Physical                            was performed, and patient medications and                            allergies were reviewed. The patient's tolerance of                            previous anesthesia was also reviewed. The risks                            and benefits of the procedure and the sedation                            options and risks were discussed with the patient.                            All questions were answered, and informed consent                            was obtained. Prior Anticoagulants: The patient has                            taken Eliquis (apixaban), last dose was 3 days                            prior to procedure. ASA Grade Assessment: III - A                            patient with severe systemic disease. After                            reviewing the risks and benefits, the patient was                            deemed in satisfactory condition to undergo the  procedure.                           After obtaining informed consent, the colonoscope                            was passed under direct vision. Throughout the                            procedure, the patient's blood pressure, pulse, and                            oxygen saturations were monitored continuously. The                            CF [id number] was introduced through the anus                             and advanced to the 4 cm into the ileum. The                            colonoscopy was performed without difficulty. The                            patient tolerated the procedure well. The quality                            of the bowel preparation was good. The terminal                            ileum, ileocecal valve, appendiceal orifice, and                            rectum were photographed. Scope In: 1:54:19 PM Scope Out: 2:10:33 PM Scope Withdrawal Time: 0 hours 13 minutes 30 seconds  Total Procedure Duration: 0 hours 16 minutes 14 seconds  Findings:                 The perianal and digital rectal examinations were                            normal.                           Non-bleeding internal hemorrhoids were found.                           Multiple small and large-mouthed diverticula were                            found in the sigmoid colon and descending colon.                           Three sessile polyps were found in the transverse  colon. The polyps were 3 to 10 mm in size. These                            polyps were removed with a cold snare. Resection                            and retrieval were complete. Estimated blood loss                            was minimal.                           A 10 mm polyp was found in the ascending colon. The                            polyp was sessile. The polyp was removed with a                            cold snare. Resection and retrieval were complete.                            Estimated blood loss was minimal.                           Two sessile polyps were found in the cecum. The                            polyps were 4 to 8 mm in size. These polyps were                            removed with a cold snare. Resection and retrieval                            were complete. Estimated blood loss was minimal.                           The exam was otherwise without abnormality on                             direct and retroflexion views. Complications:            No immediate complications. Estimated blood loss:                            Minimal. Estimated Blood Loss:     Estimated blood loss was minimal. Impression:               - Non-bleeding internal hemorrhoids.                           - Diverticulosis in the sigmoid colon and in the                            descending colon.                           -  Three 3 to 10 mm polyps in the transverse colon,                            removed with a cold snare. Resected and retrieved.                           - One 10 mm polyp in the ascending colon, removed                            with a cold snare. Resected and retrieved.                           - Two 4 to 8 mm polyps in the cecum, removed with a                            cold snare. Resected and retrieved.                           - The examination was otherwise normal on direct                            and retroflexion views. Recommendation:           - Patient has a contact number available for                            emergencies. The signs and symptoms of potential                            delayed complications were discussed with the                            patient. Return to normal activities tomorrow.                            Written discharge instructions were provided to the                            patient.                           - High fiber diet.                           - Continue present medications.                           - Await pathology results.                           - Repeat colonoscopy date to be determined after                            pending pathology results are reviewed for  surveillance.                           - Emerging evidence supports eating a diet of                            fruits, vegetables, grains, calcium, and yogurt                            while reducing  red meat and alcohol may reduce the                            risk of colon cancer.                           - Resume Eliquis (apixaban) at prior dose in 2 days.                           - Thank you for allowing me to be involved in your                            colon cancer prevention. Thornton Park MD, MD 03/12/2021 2:20:43 PM This report has been signed electronically.

## 2021-03-12 NOTE — Progress Notes (Signed)
Vitals by AS

## 2021-03-12 NOTE — Patient Instructions (Addendum)
RESUME ELIQUIS AT PRIOR DOSE IN 2 DAYS AFTER POLYP REMOVAL   Upper Endoscopy normal- biopsies done -awaiting pathology results on biopsies   Colonoscopy- Handouts on polyps, diverticulosis,hemorrhoids, & high fiber diet given to you today  Await pathology results on polyps removed        YOU HAD AN ENDOSCOPIC PROCEDURE TODAY AT [facility]:   Refer to the procedure report that was given to you for any specific questions about what was found during the examination.  If the procedure report does not answer your questions, please call your gastroenterologist to clarify.  If you requested that your care partner not be given the details of your procedure findings, then the procedure report has been included in a sealed envelope for you to review at your convenience later.  YOU SHOULD EXPECT: Some feelings of bloating in the abdomen. Passage of more gas than usual.  Walking can help get rid of the air that was put into your GI tract during the procedure and reduce the bloating. If you had a lower endoscopy (such as a colonoscopy or flexible sigmoidoscopy) you may notice spotting of blood in your stool or on the toilet paper. If you underwent a bowel prep for your procedure, you may not have a normal bowel movement for a few days.  Please Note:  You might notice some irritation and congestion in your nose or some drainage.  This is from the oxygen used during your procedure.  There is no need for concern and it should clear up in a day or so.  SYMPTOMS TO REPORT IMMEDIATELY:  Following lower endoscopy (colonoscopy or flexible sigmoidoscopy):  Excessive amounts of blood in the stool  Significant tenderness or worsening of abdominal pains  Swelling of the abdomen that is new, acute  Fever of 100F or higher  Following upper endoscopy (EGD)  Vomiting of blood or coffee ground material  New chest pain or pain under the shoulder blades  Painful or persistently difficult  swallowing  New shortness of breath  Fever of 100F or higher  Black, tarry-looking stools  For urgent or emergent issues, a gastroenterologist can be reached at any hour by calling [phone]. Do not use MyChart messaging for urgent concerns.    DIET:  We do recommend a small meal at first, but then you may proceed to your regular diet.  Drink plenty of fluids but you should avoid alcoholic beverages for 24 hours.  ACTIVITY:  You should plan to take it easy for the rest of today and you should NOT DRIVE or use heavy machinery until tomorrow (because of the sedation medicines used during the test).    FOLLOW UP: Our staff will call the number listed on your records 48-72 hours following your procedure to check on you and address any questions or concerns that you may have regarding the information given to you following your procedure. If we do not reach you, we will leave a message.  We will attempt to reach you two times.  During this call, we will ask if you have developed any symptoms of COVID 19. If you develop any symptoms (ie: fever, flu-like symptoms, shortness of breath, cough etc.) before then, please call [phone].  If you test positive for Covid 19 in the 2 weeks post procedure, please call and report this information to [place].    If any biopsies were taken you will be contacted by phone or by letter within the next 1-3 weeks.  Please call us at [phone] if you have not heard about the biopsies in 3 weeks.    SIGNATURES/CONFIDENTIALITY: You and/or your care partner have signed paperwork which will be entered into your electronic medical record.  These signatures attest to the fact that that the information above on your After Visit Summary has been reviewed and is understood.  Full responsibility of the confidentiality of this discharge information lies with you and/or your care-partner.

## 2021-03-14 ENCOUNTER — Telehealth: Payer: Self-pay

## 2021-03-14 NOTE — Telephone Encounter (Signed)
  Follow up Call-  Call back number 03/12/2021  Post procedure Call Back phone  # [phone]  Permission to leave phone message Yes  Some recent data might be hidden     Patient questions:  Do you have a fever, pain , or abdominal swelling? No. Pain Score  0 *  Have you tolerated food without any problems? Yes.    Have you been able to return to your normal activities? Yes.    Do you have any questions about your discharge instructions: Diet   No. Medications  No. Follow up visit  No.  Do you have questions or concerns about your Care? No.  Actions: * If pain score is 4 or above: No action needed, pain <4.

## 2021-03-14 NOTE — Telephone Encounter (Signed)
Left message on answering machine. 

## 2021-03-15 ENCOUNTER — Other Ambulatory Visit: Payer: Self-pay | Admitting: Family Medicine

## 2021-03-17 ENCOUNTER — Ambulatory Visit: Payer: 59 | Attending: Family Medicine

## 2021-03-17 ENCOUNTER — Other Ambulatory Visit: Payer: Self-pay

## 2021-03-17 DIAGNOSIS — R293 Abnormal posture: Secondary | ICD-10-CM | POA: Insufficient documentation

## 2021-03-17 DIAGNOSIS — R252 Cramp and spasm: Secondary | ICD-10-CM | POA: Diagnosis present

## 2021-03-17 DIAGNOSIS — M542 Cervicalgia: Secondary | ICD-10-CM | POA: Diagnosis present

## 2021-03-17 DIAGNOSIS — M6281 Muscle weakness (generalized): Secondary | ICD-10-CM | POA: Insufficient documentation

## 2021-03-17 DIAGNOSIS — G8929 Other chronic pain: Secondary | ICD-10-CM | POA: Diagnosis present

## 2021-03-17 DIAGNOSIS — M545 Low back pain, unspecified: Secondary | ICD-10-CM | POA: Insufficient documentation

## 2021-03-17 NOTE — Therapy (Signed)
[place], [place], [address] Phone: [phone]   Fax:  [phone]  Physical Therapy Treatment  Patient Details  Name: [name] MRN: [id number] Date of Birth: Dec 12, 1965 Referring Provider (PT): Dawley, Theodoro Doing, DO   Encounter Date: 03/17/2021   PT End of Session - 03/17/21 1114     Visit Number 3    Number of Visits 8    Date for PT Re-Evaluation 04/09/21    Authorization Type UHC    PT Start Time 1115    PT Stop Time 1200    PT Time Calculation (min) 45 min    Activity Tolerance Patient tolerated treatment well    Behavior During Therapy [facility] for tasks assessed/performed             Past Medical History:  Diagnosis Date   Allergy    Arthritis    "knees" (01/22/2017)   Cataract    Chronic lower back pain    GERD (gastroesophageal reflux disease)    Gout    "no flares in awhile" (01/22/2017)   History of MRI of cervical spine 09/25/2010   normal; left shoulder normal as well   Hypertension    Migraine    "~ q 6 months" (01/22/2017)   Morbid obesity ([place])    "I've lost 43# in 15 weeks w/weight watchers" (01/22/2017)   OSA on CPAP    Paroxysmal atrial fibrillation ([place]) 02/2014   chads2vasc score is 1    Past Surgical History:  Procedure Laterality Date   CARDIAC CATHETERIZATION  05/2005   "no blockage"   KNEE ARTHROSCOPY Right 02/2013   LEFT HEART CATH AND CORONARY ANGIOGRAPHY N/A 01/27/2017   Procedure: Left Heart Cath and Coronary Angiography;  Surgeon: Troy Sine, MD;  Location: [facility];  Service: Cardiovascular;  Laterality: N/A;    There were no vitals filed for this visit.   Subjective Assessment - 03/17/21 1119     Subjective Pt reports he has good and bad days. He notes having pain this AM, but it is better now.    Diagnostic tests MRI: EXAM:  MRI LUMBAR SPINE WITHOUT CONTRAST     TECHNIQUE:  Multiplanar, multisequence MR imaging of the lumbar spine was   performed. No intravenous contrast was administered.     COMPARISON:  Radiographs September 06, 2020     FINDINGS:  Segmentation: A transitional lumbosacral vertebra is assumed to  represent a sacralized L5 level. Careful correlation with this  numbering strategy prior to any procedural intervention would be  recommended.     Alignment:  Physiologic.     Vertebrae: No fracture, evidence of discitis, or bone lesion.  Congenitally short pedicles in the lumbar spine.     Conus medullaris and cauda equina: Conus extends to the L1 level.  Conus and cauda equina appear normal.     Paraspinal and other soft tissues: A 3.4 cm right renal cyst.     Disc levels:     T12-L1:No spinal canal or neural foraminal stenosis.     L1-2:No spinal canal or neural foraminal stenosis.     L2-3:Mild facet degenerative changes.No spinal canal or neural  foraminal stenosis.     L3-4:No spinal canal or neural foraminal stenosis.     L4-5:Shallow disc bulge, moderate facet degenerative changes and  ligamentum flavum redundancy resulting in mild spinal canal stenosis  with narrowing of the bilateral subarticular zones. No significant  neural foraminal narrowing.  L5-S1:Mild facet degenerative changes.No spinal canal or neural  foraminal stenosis.     IMPRESSION:  Mild degenerative changes of the lumbar spine superimposed on a  congenitally small spinal canal resulting in mild spinal canal  stenosis and narrowing of the bilateral subarticular zones at L4-5.  No high-grade neural foraminal narrowing.    Currently in Pain? Yes    Pain Score 5     Pain Location Back    Pain Orientation Left;Posterior    Pain Descriptors / Indicators Burning    Pain Type Chronic pain    Pain Onset More than a month ago    Pain Frequency Constant    Aggravating Factors  standing still and sitting toolong    Pain Relieving Factors walking                               [facility] Adult PT Treatment/Exercise - 03/17/21 0001        Exercises   Exercises Knee/Hip;Lumbar      Lumbar Exercises: Stretches   Passive Hamstring Stretch 20 seconds;3 reps;Left;Right    Passive Hamstring Stretch Limitations seaex    Hip Flexor Stretch 2 reps;Left;Right    Hip Flexor Stretch Limitations supine    Pelvic Tilt 5 seconds;10 reps    Other Lumbar Stretch Exercise Child's pose forward and laterally; 3x, 15 sec each    Other Lumbar Stretch Exercise SKTC 20" x 3 each      Lumbar Exercises: Aerobic   Nustep 5 mins, L6, UES and LEs      Lumbar Exercises: Supine   Pelvic Tilt --    Bridge 15 reps    Bridge Limitations c PPT    Other Supine Lumbar Exercises --                      PT Short Term Goals - 02/12/21 1727       PT SHORT TERM GOAL #1   Title He will be indpendent with initial HEP    Time 2    Period Weeks    Target Date 02/26/21               PT Long Term Goals - 02/12/21 1727       PT LONG TERM GOAL #1   Title The patient will have an improved FOTO score of 59% or better.    Baseline 55%    Time 8    Period Weeks    Target Date 04/09/21      PT LONG TERM GOAL #2   Title The patient will report pain at the end of the day at a 4/10 or less    Baseline 8-9/10    Time 8    Period Weeks    Target Date 04/09/21      PT LONG TERM GOAL #3   Title The patient will present with improvement of lumbar flexor strength to 4+/5 for lifting    Baseline 3+/5    Time 8    Period Weeks    Target Date 04/09/21      PT LONG TERM GOAL #4   Title The patient will be independent with an advanced HEP at the gym for back and LE.    Baseline LE only    Time 8    Period Weeks    Status New    Target Date 04/09/21      PT LONG TERM  GOAL #5   Title The patient will be able to sit for 30 minutes without left leg numbness/tingling and pain <3/10.    Baseline 5 minutes    Time 8    Period Weeks    Target Date 04/09/21                   Plan - 03/17/21 1114     Clinical Impression  Statement PT was completed for lumbopelvic flexibility and strengthening with a flexion bias as well as flexibility for the hamstrindgs and hip flexors. Following the PT session pt reported his low back back was resolved. Pt was encouraged to complete his HEP daily and complete srtetches as needed to help with the management and reduction of his low back pain.    Personal Factors and Comorbidities Comorbidity 2    Comorbidities overweight,  knee OA    Examination-Activity Limitations Bend;Carry;Lift;Stand;Sit    Examination-Participation Restrictions Cleaning;Yard Work;Laundry;Driving;Occupation    Stability/Clinical Decision Making Stable/Uncomplicated    Clinical Decision Making Low    Rehab Potential Good    PT Treatment/Interventions ADLs/Self Care Home Management;Electrical Stimulation;Gait training;Stair training;Functional mobility training;Cryotherapy;Moist Heat;Therapeutic activities;Therapeutic exercise;Balance training;Neuromuscular re-education;Patient/family education;Manual techniques;Taping;Spinal Manipulations;Joint Manipulations    PT Next Visit Plan hip flexor stretches, progress core stabilization, quadriceps/gluteal strengthening.    PT Home Exercise Plan Access Code: [email or web address] and Agree with Plan of Care Patient             Patient will benefit from skilled therapeutic intervention in order to improve the following deficits and impairments:  Abnormal gait, Postural dysfunction, Decreased strength, Impaired flexibility, Pain  Visit Diagnosis: Chronic left-sided low back pain without sciatica  Muscle weakness (generalized)  Cramp and spasm  Cervicalgia  Abnormal posture     Problem List Patient Active Problem List   Diagnosis Date Noted   Overdose 02/18/2021   Chronic left-sided low back pain without sciatica 09/05/2020   Dyshydrosis 08/19/2020   Tendonitis of wrist, left 08/19/2020   Body mass index 40.0-44.9, adult ([place]) 02/10/2019   Urinary  incontinence 01/02/2018   Left groin pain 01/02/2018   Allergic rhinitis 01/02/2018   Healthcare maintenance 09/12/2017   Acute nonintractable headache 02/03/2017   Abnormal stress test    Symptomatic sinus bradycardia    Left sided numbness    Muscle fasciculation    Primary osteoarthritis of right knee 07/23/2016   Plantar fasciitis of right foot 05/07/2016   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/05/2016   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   Dizziness 03/21/2015   Shortness of breath 03/21/2015   OSA on CPAP    PAF (paroxysmal atrial fibrillation) (HCC)    Palpitations    Atypical chest pain 03/03/2015   Left knee pain 10/13/2014   Atrial fibrillation ([place]) 03/14/2014   Right knee pain 11/10/2012   Sinusitis 10/23/2012   Left hip pain 06/17/2011   OSA (obstructive sleep apnea) 05/17/2011   Chronic back pain 10/29/2010   INSOMNIA 09/19/2008   Gout 05/13/2007   Hyperlipidemia 10/02/2006   Morbid obesity ([place]) 10/02/2006   Migraine 10/02/2006   HYPERTENSION, BENIGN SYSTEMIC 10/02/2006   GASTROESOPHAGEAL REFLUX, NO ESOPHAGITIS 10/02/2006    Gar Ponto MS, PT 03/17/21 12:24 PM   [facility] [address] [place], [place], [address] Phone: [phone]   Fax:  [phone]  Name: [name] MRN: [id number] Date of Birth: 1966/06/14

## 2021-03-19 ENCOUNTER — Ambulatory Visit: Payer: 59

## 2021-03-19 ENCOUNTER — Other Ambulatory Visit: Payer: Self-pay

## 2021-03-19 ENCOUNTER — Telehealth: Payer: Self-pay

## 2021-03-19 MED ORDER — OMEPRAZOLE 20 MG PO CPDR: 40.0000 mg | DELAYED_RELEASE_CAPSULE | Freq: Two times a day (BID) | ORAL | 3 refills | Status: DC

## 2021-03-19 MED ORDER — OMEPRAZOLE 40 MG PO CPDR: 40.0000 mg | DELAYED_RELEASE_CAPSULE | Freq: Two times a day (BID) | ORAL | 3 refills | Status: DC

## 2021-03-19 NOTE — Telephone Encounter (Signed)
-----   Message from [place], MD sent at 03/19/2021  6:23 AM EDT ----- Please call the patient. Results from his EGD show mild reflux and gastritis. Given these results, he should avoid all NSAIDs and increase his omeprazole to 40 mg BID. I would like for him to return to the office after 4-6 weeks of treatment at this higher dose to see either me or Amy Farley.  Polyps removed during his colonoscopy were all adenomas. He should have another colonoscopy in 3 years. Thanks.

## 2021-03-19 NOTE — Telephone Encounter (Signed)
Called patient and left message to please call back

## 2021-03-24 ENCOUNTER — Other Ambulatory Visit: Payer: Self-pay

## 2021-03-24 ENCOUNTER — Ambulatory Visit: Payer: 59

## 2021-03-24 DIAGNOSIS — M545 Low back pain, unspecified: Secondary | ICD-10-CM | POA: Diagnosis not present

## 2021-03-24 DIAGNOSIS — R252 Cramp and spasm: Secondary | ICD-10-CM

## 2021-03-24 DIAGNOSIS — M6281 Muscle weakness (generalized): Secondary | ICD-10-CM

## 2021-03-24 DIAGNOSIS — G8929 Other chronic pain: Secondary | ICD-10-CM

## 2021-03-24 DIAGNOSIS — R293 Abnormal posture: Secondary | ICD-10-CM

## 2021-03-24 NOTE — Therapy (Signed)
[place], [place], [address] Phone: [phone]   Fax:  [phone]  Physical Therapy Treatment  Patient Details  Name: [name] MRN: [id number] Date of Birth: April 25, 1966 Referring Provider (PT): Dawley, Theodoro Doing, DO   Encounter Date: 03/24/2021   PT End of Session - 03/24/21 1128     Visit Number 4    Number of Visits 8    Date for PT Re-Evaluation 04/09/21    Authorization Type UHC    PT Start Time 1115    PT Stop Time 1200    PT Time Calculation (min) 45 min    Activity Tolerance Patient tolerated treatment well    Behavior During Therapy [facility] for tasks assessed/performed             Past Medical History:  Diagnosis Date   Allergy    Arthritis    "knees" (01/22/2017)   Cataract    Chronic lower back pain    GERD (gastroesophageal reflux disease)    Gout    "no flares in awhile" (01/22/2017)   History of MRI of cervical spine 09/25/2010   normal; left shoulder normal as well   Hypertension    Migraine    "~ q 6 months" (01/22/2017)   Morbid obesity ([place])    "I've lost 43# in 15 weeks w/weight watchers" (01/22/2017)   OSA on CPAP    Paroxysmal atrial fibrillation ([place]) 02/2014   chads2vasc score is 1    Past Surgical History:  Procedure Laterality Date   CARDIAC CATHETERIZATION  05/2005   "no blockage"   KNEE ARTHROSCOPY Right 02/2013   LEFT HEART CATH AND CORONARY ANGIOGRAPHY N/A 01/27/2017   Procedure: Left Heart Cath and Coronary Angiography;  Surgeon: Troy Sine, MD;  Location: [place] [facility];  Service: Cardiovascular;  Laterality: N/A;    There were no vitals filed for this visit.   Subjective Assessment - 03/24/21 1126     Subjective Pt reports being able to exercise this week more which has helped his low back pain.    Currently in Pain? Yes    Pain Score 2     Pain Location Back    Pain Orientation Posterior    Pain Descriptors / Indicators Burning    Pain Type  Chronic pain    Pain Onset More than a month ago    Pain Frequency Intermittent                               [facility] Adult PT Treatment/Exercise - 03/24/21 0001       Exercises   Exercises Knee/Hip;Lumbar      Lumbar Exercises: Stretches   Passive Hamstring Stretch 20 seconds;3 reps;Left;Right    Passive Hamstring Stretch Limitations seated    Hip Flexor Stretch 2 reps;Left;Right    Hip Flexor Stretch Limitations supine    Other Lumbar Stretch Exercise Child's pose forward and laterally; 3x, 15 sec each      Lumbar Exercises: Aerobic   Nustep 5 mins, L6, UES and LEs      Lumbar Exercises: Standing   Other Standing Lumbar Exercises Palloff press10x2, Blue Tband      Lumbar Exercises: Supine   Pelvic Tilt 10 reps   3 sec   Bent Knee Raise 10 reps   3 sets   Bent Knee Raise Limitations PPT    Dead Bug --   3 reps  Dead Bug Limitations abd bracing                    PT Education - 03/24/21 1227     Education Details HEP update    Person(s) Educated Patient    Methods Explanation;Demonstration;Tactile cues;Verbal cues;Handout    Comprehension Verbalized understanding;Returned demonstration;Verbal cues required;Tactile cues required              PT Short Term Goals - 03/24/21 1231       PT SHORT TERM GOAL #1   Title He will be indpendent with initial HEP    Status Achieved    Target Date 03/24/21               PT Long Term Goals - 02/12/21 1727       PT LONG TERM GOAL #1   Title The patient will have an improved FOTO score of 59% or better.    Baseline 55%    Time 8    Period Weeks    Target Date 04/09/21      PT LONG TERM GOAL #2   Title The patient will report pain at the end of the day at a 4/10 or less    Baseline 8-9/10    Time 8    Period Weeks    Target Date 04/09/21      PT LONG TERM GOAL #3   Title The patient will present with improvement of lumbar flexor strength to 4+/5 for lifting    Baseline 3+/5     Time 8    Period Weeks    Target Date 04/09/21      PT LONG TERM GOAL #4   Title The patient will be independent with an advanced HEP at the gym for back and LE.    Baseline LE only    Time 8    Period Weeks    Status New    Target Date 04/09/21      PT LONG TERM GOAL #5   Title The patient will be able to sit for 30 minutes without left leg numbness/tingling and pain <3/10.    Baseline 5 minutes    Time 8    Period Weeks    Target Date 04/09/21                   Plan - 03/24/21 1228     Clinical Impression Statement Continued PT for lumbopelvic flexibility and strengthening. pt reports being consistent with his HEP which has improved the back pain this week. More core strengthening exs were added today with pt tolerated well. Pt voiced understanding between core strengthing and spine stability and decreased compressive forces with his low back. Pt reported no low back pain at the end of the session.    Personal Factors and Comorbidities Comorbidity 2    Comorbidities overweight,  knee OA    Examination-Activity Limitations Bend;Carry;Lift;Stand;Sit    Examination-Participation Restrictions Cleaning;Yard Work;Laundry;Driving;Occupation    Stability/Clinical Decision Making Stable/Uncomplicated    Clinical Decision Making Low    Rehab Potential Good    PT Treatment/Interventions ADLs/Self Care Home Management;Electrical Stimulation;Gait training;Stair training;Functional mobility training;Cryotherapy;Moist Heat;Therapeutic activities;Therapeutic exercise;Balance training;Neuromuscular re-education;Patient/family education;Manual techniques;Taping;Spinal Manipulations;Joint Manipulations    PT Next Visit Plan hip flexor stretches, progress core stabilization, quadriceps/gluteal strengthening.    PT Home Exercise Plan Access Code: [email or web address]    Consulted and Agree with Plan of Care Patient  Patient will benefit from skilled therapeutic intervention in order  to improve the following deficits and impairments:  Abnormal gait, Postural dysfunction, Decreased strength, Impaired flexibility, Pain  Visit Diagnosis: Chronic left-sided low back pain without sciatica  Muscle weakness (generalized)  Cramp and spasm  Abnormal posture     Problem List Patient Active Problem List   Diagnosis Date Noted   Overdose 02/18/2021   Chronic left-sided low back pain without sciatica 09/05/2020   Dyshydrosis 08/19/2020   Tendonitis of wrist, left 08/19/2020   Body mass index 40.0-44.9, adult ([place]) 02/10/2019   Urinary incontinence 01/02/2018   Left groin pain 01/02/2018   Allergic rhinitis 01/02/2018   Healthcare maintenance 09/12/2017   Acute nonintractable headache 02/03/2017   Abnormal stress test    Symptomatic sinus bradycardia    Left sided numbness    Muscle fasciculation    Primary osteoarthritis of right knee 07/23/2016   Plantar fasciitis of right foot 05/07/2016   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/05/2016   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   Dizziness 03/21/2015   Shortness of breath 03/21/2015   OSA on CPAP    PAF (paroxysmal atrial fibrillation) (HCC)    Palpitations    Atypical chest pain 03/03/2015   Left knee pain 10/13/2014   Atrial fibrillation ([place]) 03/14/2014   Right knee pain 11/10/2012   Sinusitis 10/23/2012   Left hip pain 06/17/2011   OSA (obstructive sleep apnea) 05/17/2011   Chronic back pain 10/29/2010   INSOMNIA 09/19/2008   Gout 05/13/2007   Hyperlipidemia 10/02/2006   Morbid obesity ([place]) 10/02/2006   Migraine 10/02/2006   HYPERTENSION, BENIGN SYSTEMIC 10/02/2006   GASTROESOPHAGEAL REFLUX, NO ESOPHAGITIS 10/02/2006   Gar Ponto MS, PT 03/24/21 12:43 PM   [facility] [address] [place], [place], [address] Phone: [phone]   Fax:  [phone]  Name: Kywon Boulanger MRN: [id number] Date of Birth:  03-30-1966

## 2021-03-31 ENCOUNTER — Ambulatory Visit: Payer: 59

## 2021-04-08 ENCOUNTER — Other Ambulatory Visit: Payer: Self-pay

## 2021-04-08 ENCOUNTER — Ambulatory Visit
Admission: EM | Admit: 2021-04-08 | Discharge: 2021-04-08 | Disposition: A | Discharge status: Home / Self Care | Payer: 59 | Attending: Urgent Care | Admitting: Urgent Care

## 2021-04-08 DIAGNOSIS — M1711 Unilateral primary osteoarthritis, right knee: Secondary | ICD-10-CM

## 2021-04-08 DIAGNOSIS — M25561 Pain in right knee: Secondary | ICD-10-CM

## 2021-04-08 MED ORDER — PREDNISONE 50 MG PO TABS: 50.0000 mg | ORAL_TABLET | Freq: Every day | ORAL | 0 refills | Status: DC

## 2021-04-08 NOTE — ED Provider Notes (Signed)
[name]   MRN: [id number] DOB: [birth year]  Subjective:   [name] is a 55 y.o. male presenting for 2-day history of acute on chronic right knee pain with swelling.  Patient has had difficulty bearing weight, walking.  Has a history of arthritis in the knee, has also had meniscal tear.  Has been recommended to have knee replacement but has not done so.  The last knee surgery he had was an arthroscopy done in 2014.  Has had multiple imaging done since then of both of his knees by his orthopedist, Dr. Erlinda Hong.  No recent falls, trauma, history of gout.  No hot sensation, redness.  No current facility-administered medications for this encounter.  Current Outpatient Medications:    apixaban (ELIQUIS) 5 MG TABS tablet, Take 1 tablet (5 mg total) by mouth 2 (two) times daily., Disp: 180 tablet, Rfl: 3   atorvastatin (LIPITOR) 20 MG tablet, Take 1 tablet (20 mg total) by mouth every other day., Disp: 30 tablet, Rfl: 1   diclofenac Sodium (VOLTAREN) 1 % GEL, Apply 2 g topically daily as needed (For pain)., Disp: 50 g, Rfl: 1   dofetilide (TIKOSYN) 500 MCG capsule, Take 1 capsule (500 mcg total) by mouth 2 (two) times daily., Disp: 60 capsule, Rfl: 12   fluticasone (FLONASE) 50 MCG/ACT nasal spray, Place 2 sprays into both nostrils daily., Disp: 16 g, Rfl: 6   losartan (COZAAR) 50 MG tablet, Take 1 tablet by mouth once daily, Disp: 90 tablet, Rfl: 3   metoprolol succinate (TOPROL XL) 25 MG 24 hr tablet, Take 0.5 tablets (12.5 mg total) by mouth daily., Disp: 30 tablet, Rfl: 3   MITIGARE 0.6 MG CAPS, Take 1 capsule by mouth once daily, Disp: 90 capsule, Rfl: 0   Multiple Vitamin (MULTIVITAMIN WITH MINERALS) TABS tablet, Take 1 tablet by mouth at bedtime., Disp: , Rfl:    Omega-3 Fatty Acids (FISH OIL) 1000 MG CAPS, Take 2,000 mg by mouth in the morning. , Disp: , Rfl:    omeprazole (PRILOSEC) 40 MG capsule, Take 1 capsule (40 mg total) by mouth 2 (two) times daily before a meal., Disp: 60  capsule, Rfl: 3   tamsulosin (FLOMAX) 0.4 MG CAPS capsule, Take 1 capsule by mouth once daily, Disp: 30 capsule, Rfl: 3   TURMERIC PO, Take 1 capsule by mouth every other day. , Disp: , Rfl:    Allergies  Allergen Reactions   Hydrochlorothiazide-Propranolol [Propranolol-Hctz] Other (See Comments)    Joints lock up   Penicillins Other (See Comments)    Hives Makes joints stiff and unable to move Has patient had a PCN reaction causing immediate rash, facial/tongue/throat swelling, SOB or lightheadedness with hypotension: No Has patient had a PCN reaction causing severe rash involving mucus membranes or skin necrosis: No Has patient had a PCN reaction that required hospitalization No Has patient had a PCN reaction occurring within the last 10 years: No If all of the above answers are "NO", then may proceed with Cephalosporin use.    Past Medical History:  Diagnosis Date   Allergy    Arthritis    "knees" (01/22/2017)   Cataract    Chronic lower back pain    GERD (gastroesophageal reflux disease)    Gout    "no flares in awhile" (01/22/2017)   History of MRI of cervical spine 09/25/2010   normal; left shoulder normal as well   Hypertension    Migraine    "~ q 6 months" (01/22/2017)   Morbid  obesity ([place])    "I've lost 43# in 15 weeks w/weight watchers" (01/22/2017)   OSA on CPAP    Paroxysmal atrial fibrillation ([place]) 02/2014   chads2vasc score is 1     Past Surgical History:  Procedure Laterality Date   CARDIAC CATHETERIZATION  05/2005   "no blockage"   KNEE ARTHROSCOPY Right 02/2013   LEFT HEART CATH AND CORONARY ANGIOGRAPHY N/A 01/27/2017   Procedure: Left Heart Cath and Coronary Angiography;  Surgeon: Troy Sine, MD;  Location: [facility];  Service: Cardiovascular;  Laterality: N/A;    Family History  Problem Relation Age of Onset   Atrial fibrillation Mother 61       had a stroke   Hypertension Father    Diabetes Father    Colon cancer Neg Hx     Esophageal cancer Neg Hx    Stomach cancer Neg Hx     Social History   Tobacco Use   Smoking status: Former    Packs/day: 1.00    Years: 10.00    Pack years: 10.00    Types: Cigarettes    Quit date: 04/02/1994    Years since quitting: 27.0   Smokeless tobacco: Former    Types: Chew   Tobacco comments:    "chewed in my teens"  Vaping Use   Vaping Use: Never used  Substance Use Topics   Alcohol use: No    Alcohol/week: 0.0 standard drinks    Comment: "stopped in 1994"   Drug use: Not Currently    Types: Marijuana    Comment: "stopped marijuna in 1994"    ROS   Objective:   Vitals: BP 140/83 (BP Location: Left Arm)   Pulse 71   Temp 98 F (36.7 C) (Oral)   Resp 18   SpO2 95%   Physical Exam Constitutional:      General: He is not in acute distress.    Appearance: Normal appearance. He is well-developed. He is obese. He is not ill-appearing, toxic-appearing or diaphoretic.  HENT:     Head: Normocephalic and atraumatic.     Right Ear: External ear normal.     Left Ear: External ear normal.     Nose: Nose normal.     Mouth/Throat:     Pharynx: Oropharynx is clear.  Eyes:     General: No scleral icterus.       Right eye: No discharge.        Left eye: No discharge.     Extraocular Movements: Extraocular movements intact.     Pupils: Pupils are equal, round, and reactive to light.  Cardiovascular:     Rate and Rhythm: Normal rate.  Pulmonary:     Effort: Pulmonary effort is normal.  Musculoskeletal:     Cervical back: Normal range of motion.     Right knee: Swelling present. No deformity, effusion, erythema, ecchymosis, lacerations, bony tenderness or crepitus. Decreased range of motion. Tenderness present over the medial joint line, lateral joint line and patellar tendon. Normal alignment and normal patellar mobility.  Neurological:     Mental Status: He is alert and oriented to person, place, and time.  Psychiatric:        Mood and Affect: Mood normal.         Behavior: Behavior normal.        Thought Content: Thought content normal.        Judgment: Judgment normal.    Assessment and Plan :   PDMP not reviewed  this encounter.  1. Arthritis of right knee   2. Acute pain of right knee    Suspect the patient is having an arthritis flare as this is been an established diagnosis.  Recommended an oral prednisone course.  Follow-up closely with Dr. Erlinda Hong, his orthopedist.  As there is no recent fall, trauma will defer imaging especially since he has an established diagnosis of arthritis of his knee. Counseled patient on potential for adverse effects with medications prescribed/recommended today, ER and return-to-clinic precautions discussed, patient verbalized understanding.     Jaynee Eagles, PA-C 04/08/21 1125

## 2021-04-08 NOTE — ED Triage Notes (Signed)
Two day h/o right knee pain. Has tried ice and voltaren gel without relief. NO falls or injuries noted.

## 2021-04-12 ENCOUNTER — Ambulatory Visit: Payer: 59 | Admitting: Physician Assistant

## 2021-04-13 ENCOUNTER — Other Ambulatory Visit: Payer: Self-pay

## 2021-04-13 ENCOUNTER — Ambulatory Visit: Payer: 59 | Admitting: Orthopaedic Surgery

## 2021-04-13 ENCOUNTER — Ambulatory Visit: Payer: Self-pay

## 2021-04-13 ENCOUNTER — Encounter: Payer: Self-pay | Admitting: Orthopaedic Surgery

## 2021-04-13 DIAGNOSIS — G8929 Other chronic pain: Secondary | ICD-10-CM | POA: Diagnosis not present

## 2021-04-13 DIAGNOSIS — Z6841 Body Mass Index (BMI) 40.0 and over, adult: Secondary | ICD-10-CM

## 2021-04-13 DIAGNOSIS — M25561 Pain in right knee: Secondary | ICD-10-CM | POA: Diagnosis not present

## 2021-04-13 MED ORDER — LIDOCAINE HCL 1 % IJ SOLN
2.0000 mL | INTRAMUSCULAR | Status: AC | PRN
  Administered 2021-04-13: 2 mL

## 2021-04-13 MED ORDER — METHYLPREDNISOLONE ACETATE 40 MG/ML IJ SUSP
40.0000 mg | INTRAMUSCULAR | Status: AC | PRN
  Administered 2021-04-13: 40 mg via INTRA_ARTICULAR

## 2021-04-13 MED ORDER — BUPIVACAINE HCL 0.5 % IJ SOLN
2.0000 mL | INTRAMUSCULAR | Status: AC | PRN
  Administered 2021-04-13: 2 mL via INTRA_ARTICULAR

## 2021-04-13 NOTE — Progress Notes (Signed)
Office Visit Note   Patient: [name]           Date of Birth: 1965-11-22           MRN: [id number] Visit Date: 04/13/2021              Requested by: [name], [place] [place],  [place] [address] PCP: [name], MD   Assessment & Plan: Visit Diagnoses:  1. Chronic pain of right knee   2. Body mass index 40.0-44.9, adult ([place])   3. Morbid obesity ([place])     Plan: Impression is right knee DJD.  Cortisone injection administered today.  We spent a fair amount of time talking about the importance of weight loss and how this affects pain from degenerative joint disease.  The patient meets the AMA guidelines for Morbid (severe) obesity with a BMI > 40.0 and I have recommended weight loss.  Follow-Up Instructions: Return if symptoms worsen or fail to improve.   Orders:  Orders Placed This Encounter  Procedures  . XR KNEE 3 VIEW RIGHT   No orders of the defined types were placed in this encounter.     Procedures: Large Joint Inj: R knee on 04/13/2021 2:07 PM Indications: pain Details: 22 G needle  Arthrogram: No  Medications: 40 mg methylPREDNISolone acetate 40 MG/ML; 2 mL lidocaine 1 %; 2 mL bupivacaine 0.5 % Consent was given by the patient. Patient was prepped and draped in the usual sterile fashion.      Clinical Data: No additional findings.   Subjective: Chief Complaint  Patient presents with  . Right Knee - Pain    HPI  Time is a very pleasant 55 year old gentleman who comes in for evaluation of chronic right knee pain with recent flareup last week.  He took some oral prednisone which he finished yesterday but the pain has come back.  He feels occasional locking and swelling.  Review of Systems   Objective: Vital Signs: There were no vitals taken for this visit.  Physical Exam  Ortho Exam  Right knee shows no effusion.  1+ crepitus with range of motion.  Collaterals and cruciates are stable.  No signs of  infection.  Specialty Comments:  No specialty comments available.  Imaging: XR KNEE 3 VIEW RIGHT  Result Date: 04/13/2021 Advanced tricompartmental DJD.    PMFS History: Patient Active Problem List   Diagnosis Date Noted  . Overdose 02/18/2021  . Chronic left-sided low back pain without sciatica 09/05/2020  . Dyshydrosis 08/19/2020  . Tendonitis of wrist, left 08/19/2020  . Body mass index 40.0-44.9, adult ([place]) 02/10/2019  . Urinary incontinence 01/02/2018  . Left groin pain 01/02/2018  . Allergic rhinitis 01/02/2018  . Healthcare maintenance 09/12/2017  . Acute nonintractable headache 02/03/2017  . Abnormal stress test   . Symptomatic sinus bradycardia   . Left sided numbness   . Muscle fasciculation   . Primary osteoarthritis of right knee 07/23/2016  . Plantar fasciitis of right foot 05/07/2016  . 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 Dizziness 03/21/2015  . Shortness of breath 03/21/2015  . OSA on CPAP   . PAF (paroxysmal atrial fibrillation) ([place])   . Palpitations   . Atypical chest pain 03/03/2015  . Left knee pain 10/13/2014  . Atrial fibrillation ([place]) 03/14/2014  . Right knee pain 11/10/2012  . Sinusitis 10/23/2012  . Left hip pain 06/17/2011  .  OSA (obstructive sleep apnea) 05/17/2011  . Chronic back pain 10/29/2010  . INSOMNIA 09/19/2008  . Gout 05/13/2007  . Hyperlipidemia 10/02/2006  . Morbid obesity ([place]) 10/02/2006  . Migraine 10/02/2006  . HYPERTENSION, BENIGN SYSTEMIC 10/02/2006  . GASTROESOPHAGEAL REFLUX, NO ESOPHAGITIS 10/02/2006   Past Medical History:  Diagnosis Date  . Allergy   . Arthritis    "knees" (01/22/2017)  . Cataract   . Chronic lower back pain   . GERD (gastroesophageal reflux disease)   . Gout    "no flares in awhile" (01/22/2017)  . History of MRI of cervical spine 09/25/2010   normal; left shoulder normal as well  . Hypertension    . Migraine    "~ q 6 months" (01/22/2017)  . Morbid obesity ([place])    "I've lost 43# in 15 weeks w/weight watchers" (01/22/2017)  . OSA on CPAP   . Paroxysmal atrial fibrillation ([facility]) 02/2014   chads2vasc score is 1    Family History  Problem Relation Age of Onset  . Atrial fibrillation Mother 20       had a stroke  . Hypertension Father   . Diabetes Father   . Colon cancer Neg Hx   . Esophageal cancer Neg Hx   . Stomach cancer Neg Hx     Past Surgical History:  Procedure Laterality Date  . CARDIAC CATHETERIZATION  05/2005   "no blockage"  . KNEE ARTHROSCOPY Right 02/2013  . LEFT HEART CATH AND CORONARY ANGIOGRAPHY N/A 01/27/2017   Procedure: Left Heart Cath and Coronary Angiography;  Surgeon: Troy Sine, MD;  Location: [facility];  Service: Cardiovascular;  Laterality: N/A;   Social History   Occupational History  . Not on file  Tobacco Use  . Smoking status: Former    Packs/day: 1.00    Years: 10.00    Pack years: 10.00    Types: Cigarettes    Quit date: 04/02/1994    Years since quitting: 27.0  . Smokeless tobacco: Former    Types: Chew  . Tobacco comments:    "chewed in my teens"  Vaping Use  . Vaping Use: Never used  Substance and Sexual Activity  . Alcohol use: No    Alcohol/week: 0.0 standard drinks    Comment: "stopped in 1994"  . Drug use: Not Currently    Types: Marijuana    Comment: "stopped marijuna in 1994"  . Sexual activity: Yes

## 2021-04-14 ENCOUNTER — Ambulatory Visit: Payer: 59 | Admitting: Physical Therapy

## 2021-04-28 ENCOUNTER — Ambulatory Visit: Payer: 59 | Attending: Family Medicine | Admitting: Physical Therapy

## 2021-04-28 ENCOUNTER — Encounter: Payer: Self-pay | Admitting: Physical Therapy

## 2021-04-28 ENCOUNTER — Other Ambulatory Visit: Payer: Self-pay

## 2021-04-28 DIAGNOSIS — G8929 Other chronic pain: Secondary | ICD-10-CM | POA: Insufficient documentation

## 2021-04-28 DIAGNOSIS — R252 Cramp and spasm: Secondary | ICD-10-CM | POA: Diagnosis present

## 2021-04-28 DIAGNOSIS — M545 Low back pain, unspecified: Secondary | ICD-10-CM | POA: Diagnosis not present

## 2021-04-28 DIAGNOSIS — M542 Cervicalgia: Secondary | ICD-10-CM | POA: Insufficient documentation

## 2021-04-28 DIAGNOSIS — R293 Abnormal posture: Secondary | ICD-10-CM | POA: Diagnosis present

## 2021-04-28 DIAGNOSIS — M6281 Muscle weakness (generalized): Secondary | ICD-10-CM | POA: Insufficient documentation

## 2021-04-28 NOTE — Therapy (Signed)
[place], [place], [address] Phone: [phone]   Fax:  [phone]  PHYSICAL THERAPY DISCHARGE SUMMARY  Visits from Start of Care: 5  Current functional level related to goals / functional outcomes: See assessment/goals   Remaining deficits: See assessment/goals   Education / Equipment: HEP and D/C plans  Patient agrees to discharge. Patient goals were met. Patient is being discharged due to meeting the stated rehab goals.   Patient Details  Name: [name] MRN: [id number] Date of Birth: [birth year] Referring Provider (PT): Dawley, Theodoro Doing, DO   Encounter Date: 04/28/2021   PT End of Session - 04/28/21 1111     Visit Number 5    Number of Visits 8    Date for PT Re-Evaluation 04/09/21    Authorization Type UHC    PT Start Time 1110    PT Stop Time 1152    PT Time Calculation (min) 42 min    Activity Tolerance Patient tolerated treatment well    Behavior During Therapy [facility] for tasks assessed/performed             Past Medical History:  Diagnosis Date   Allergy    Arthritis    "knees" (01/22/2017)   Cataract    Chronic lower back pain    GERD (gastroesophageal reflux disease)    Gout    "no flares in awhile" (01/22/2017)   History of MRI of cervical spine 09/25/2010   normal; left shoulder normal as well   Hypertension    Migraine    "~ q 6 months" (01/22/2017)   Morbid obesity ([place])    "I've lost 43# in 15 weeks w/weight watchers" (01/22/2017)   OSA on CPAP    Paroxysmal atrial fibrillation ([place]) 02/2014   chads2vasc score is 1    Past Surgical History:  Procedure Laterality Date   CARDIAC CATHETERIZATION  05/2005   "no blockage"   KNEE ARTHROSCOPY Right 02/2013   LEFT HEART CATH AND CORONARY ANGIOGRAPHY N/A 01/27/2017   Procedure: Left Heart Cath and Coronary Angiography;  Surgeon: Troy Sine, MD;  Location: [facility];  Service: Cardiovascular;  Laterality: N/A;     There were no vitals filed for this visit.   Subjective Assessment - 04/28/21 1115     Subjective Pt reports that he was moving furniture at work this week which caused an increase in back pain.  He has been somewhat HEP compliant.  7/10 back pain currenlty, but back pain has been absent or very low before acute exacerbation.  Aggs: bending, lifting Eases: rest, gentle stretching    Diagnostic tests MRI: EXAM:  MRI LUMBAR SPINE WITHOUT CONTRAST     TECHNIQUE:  Multiplanar, multisequence MR imaging of the lumbar spine was  performed. No intravenous contrast was administered.     COMPARISON:  Radiographs September 06, 2020     FINDINGS:  Segmentation: A transitional lumbosacral vertebra is assumed to  represent a sacralized L5 level. Careful correlation with this  numbering strategy prior to any procedural intervention would be  recommended.     Alignment:  Physiologic.     Vertebrae: No fracture, evidence of discitis, or bone lesion.  Congenitally short pedicles in the lumbar spine.     Conus medullaris and cauda equina: Conus extends to the L1 level.  Conus and cauda equina appear normal.     Paraspinal and other soft tissues: A 3.4 cm right renal cyst.     Disc levels:  T12-L1:No spinal canal or neural foraminal stenosis.     L1-2:No spinal canal or neural foraminal stenosis.     L2-3:Mild facet degenerative changes.No spinal canal or neural  foraminal stenosis.     L3-4:No spinal canal or neural foraminal stenosis.     L4-5:Shallow disc bulge, moderate facet degenerative changes and  ligamentum flavum redundancy resulting in mild spinal canal stenosis  with narrowing of the bilateral subarticular zones. No significant  neural foraminal narrowing.     L5-S1:Mild facet degenerative changes.No spinal canal or neural  foraminal stenosis.     IMPRESSION:  Mild degenerative changes of the lumbar spine superimposed on a  congenitally small spinal canal resulting in mild spinal canal  stenosis and narrowing of  the bilateral subarticular zones at L4-5.  No high-grade neural foraminal narrowing.    Pain Onset More than a month ago             [facility] Adult PT Treatment/Exercise:  Therapeutic Exercise:  - nu-step L5 22mwhile taking subjective and planning session with patient - Manual HS stretch 30''x2 ea - hip flexor stretch thomas position with manual quad stretch 2x30'' - LTR 20x - bridge 2x10 5''  Therapeutic Activity - collecting information for goals, checking progress, and reviewing with patient    PT Short Term Goals - 03/24/21 1231       PT SHORT TERM GOAL #1   Title He will be indpendent with initial HEP    Status Achieved    Target Date 03/24/21               PT Long Term Goals - 04/28/21 1140       PT LONG TERM GOAL #1   Title The patient will have an improved FOTO score of 59% or better.    Baseline 55% 9/24: no FOTO in system    Time 8    Period Weeks      PT LONG TERM GOAL #2   Title The patient will report pain at the end of the day at a 4/10 or less    Baseline 8-9/10 9/24: 2/10 on average    Time 8    Period Weeks    Status Achieved      PT LONG TERM GOAL #3   Title The patient will present with improvement of lumbar flexor strength to 4+/5 for lifting    Baseline 3+/5 9/24: not limited in lifting at work, only minor pain    Time 8    Period Weeks    Status Achieved      PT LONG TERM GOAL #4   Title The patient will be independent with an advanced HEP at the gym for back and LE.    Baseline LE only 9/24: reports he is independent    Time 8    Period Weeks    Status Achieved      PT LONG TERM GOAL #5   Title The patient will be able to sit for 30 minutes without left leg numbness/tingling and pain <3/10.    Baseline 5 minutes 9/24: MET    Time 8    Period Weeks    Status Achieved                   Plan - 04/28/21 1136     Clinical Impression Statement Wayne Kylerhas progressed well with therapy.  Improved impairments  include: lumbar strength, reduce pain and n/t.  Functional improvements include: sitting tolerance, lifting  tolerance, ability to complete work with minimal pain.  Progressions needed include: continued progression at home with HEP.  Barriers to progress include: job that demands heavy lifting.  Pt with an acute flare up of low back pain today, but overall reports average pain which meets all goals.  He reports he is confident he can self manage via HEP.  Please see baseline and/or status section in "Goals" for specific progress on short term and long term goals established at evaluation.  I recommend D/C home with HEP; pt agrees with plan.    Personal Factors and Comorbidities Comorbidity 2    Comorbidities overweight,  knee OA    Examination-Activity Limitations Bend;Carry;Lift;Stand;Sit    Examination-Participation Restrictions Cleaning;Yard Work;Laundry;Driving;Occupation    Stability/Clinical Decision Making Stable/Uncomplicated    Rehab Potential Good    PT Treatment/Interventions ADLs/Self Care Home Management;Electrical Stimulation;Gait training;Stair training;Functional mobility training;Cryotherapy;Moist Heat;Therapeutic activities;Therapeutic exercise;Balance training;Neuromuscular re-education;Patient/family education;Manual techniques;Taping;Spinal Manipulations;Joint Manipulations    PT Next Visit Plan hip flexor stretches, progress core stabilization, quadriceps/gluteal strengthening.    PT Home Exercise Plan Access Code: [email or web address]    [email or web address] and Agree with Plan of Care Patient             Patient will benefit from skilled therapeutic intervention in order to improve the following deficits and impairments:  Abnormal gait, Postural dysfunction, Decreased strength, Impaired flexibility, Pain  Visit Diagnosis: Chronic left-sided low back pain without sciatica  Muscle weakness (generalized)  Cramp and spasm  Abnormal posture  Cervicalgia     Problem List Patient Active  Problem List   Diagnosis Date Noted   Overdose 02/18/2021   Chronic left-sided low back pain without sciatica 09/05/2020   Dyshydrosis 08/19/2020   Tendonitis of wrist, left 08/19/2020   Body mass index 40.0-44.9, adult ([place]) 02/10/2019   Urinary incontinence 01/02/2018   Left groin pain 01/02/2018   Allergic rhinitis 01/02/2018   Healthcare maintenance 09/12/2017   Acute nonintractable headache 02/03/2017   Abnormal stress test    Symptomatic sinus bradycardia    Left sided numbness    Muscle fasciculation    Primary osteoarthritis of right knee 07/23/2016   Plantar fasciitis of right foot 05/07/2016   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/05/2016   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   Dizziness 03/21/2015   Shortness of breath 03/21/2015   OSA on CPAP    PAF (paroxysmal atrial fibrillation) ([facility])    Palpitations    Atypical chest pain 03/03/2015   Left knee pain 10/13/2014   Atrial fibrillation ([place]) 03/14/2014   Right knee pain 11/10/2012   Sinusitis 10/23/2012   Left hip pain 06/17/2011   OSA (obstructive sleep apnea) 05/17/2011   Chronic back pain 10/29/2010   INSOMNIA 09/19/2008   Gout 05/13/2007   Hyperlipidemia 10/02/2006   Morbid obesity ([place]) 10/02/2006   Migraine 10/02/2006   HYPERTENSION, BENIGN SYSTEMIC 10/02/2006   GASTROESOPHAGEAL REFLUX, NO ESOPHAGITIS 10/02/2006    Mathis Dad, PT 04/28/2021, 11:55 AM  [facility] [address] [place], [place], [address] Phone: [phone]   Fax:  [phone]  Name: [name] MRN: [id number] Date of Birth: [birth year]

## 2021-05-17 ENCOUNTER — Ambulatory Visit: Payer: 59

## 2021-05-17 NOTE — Progress Notes (Deleted)
    SUBJECTIVE:   CHIEF COMPLAINT / HPI: Hands cracking and painful  ***  PERTINENT  PMH / PSH: OA, PAF on apixaban, HTN, GERD  OBJECTIVE:   There were no vitals taken for this visit.  General: ***, NAD CV: RRR, no murmurs*** Pulm: CTAB, no wheezes or rales  ASSESSMENT/PLAN:   No problem-specific Assessment & Plan notes found for this encounter.     Zola Button, MD [place]   {    This will disappear when note is signed, click to select method of visit    :1}

## 2021-06-04 ENCOUNTER — Telehealth (HOSPITAL_COMMUNITY): Payer: Self-pay

## 2021-06-04 ENCOUNTER — Ambulatory Visit (INDEPENDENT_AMBULATORY_CARE_PROVIDER_SITE_OTHER): Payer: 59 | Admitting: Family Medicine

## 2021-06-04 ENCOUNTER — Other Ambulatory Visit: Payer: Self-pay

## 2021-06-04 ENCOUNTER — Encounter: Payer: Self-pay | Admitting: Family Medicine

## 2021-06-04 VITALS — BP 133/78 | HR 83 | Wt 328.0 lb

## 2021-06-04 DIAGNOSIS — J302 Other seasonal allergic rhinitis: Secondary | ICD-10-CM

## 2021-06-04 DIAGNOSIS — J329 Chronic sinusitis, unspecified: Secondary | ICD-10-CM | POA: Diagnosis not present

## 2021-06-04 MED ORDER — FEXOFENADINE HCL 60 MG PO TABS: 60.0000 mg | ORAL_TABLET | Freq: Every day | ORAL | 1 refills | Status: DC

## 2021-06-04 NOTE — Patient Instructions (Signed)
I have changed your prescription to [facility]. Please continue your flonase nasal spray.   I have also submitted a referral to Ear, Nose and Throat specialist .Please let us know if you do not hear from them within 2 weeks.   Sinusitis, Adult Sinusitis is soreness and swelling (inflammation) of your sinuses. Sinuses are hollow spaces in the bones around your face. They are located: Around your eyes. In the middle of your forehead. Behind your nose. In your cheekbones. Your sinuses and nasal passages are lined with a fluid called mucus. Mucus drains out of your sinuses. Swelling can trap mucus in your sinuses. This lets germs (bacteria, virus, or fungus) grow, which leads to infection. Most of the time, this condition is caused by a virus. What are the causes? This condition is caused by: Allergies. Asthma. Germs. Things that block your nose or sinuses. Growths in the nose (nasal polyps). Chemicals or irritants in the air. Fungus (rare). What increases the risk? You are more likely to develop this condition if: You have a weak body defense system (immune system). You do a lot of swimming or diving. You use nasal sprays too much. You smoke. What are the signs or symptoms? The main symptoms of this condition are pain and a feeling of pressure around the sinuses. Other symptoms include: Stuffy nose (congestion). Runny nose (drainage). Swelling and warmth in the sinuses. Headache. Toothache. A cough that may get worse at night. Mucus that collects in the throat or the back of the nose (postnasal drip). Being unable to smell and taste. Being very tired (fatigue). A fever. Sore throat. Bad breath. How is this diagnosed? This condition is diagnosed based on: Your symptoms. Your medical history. A physical exam. Tests to find out if your condition is short-term (acute) or long-term (chronic). Your doctor may: Check your nose for growths (polyps). Check your sinuses using a tool  that has a light (endoscope). Check for allergies or germs. Do imaging tests, such as an MRI or CT scan. How is this treated? Treatment for this condition depends on the cause and whether it is short-term or long-term. If caused by a virus, your symptoms should go away on their own within 10 days. You may be given medicines to relieve symptoms. They include: Medicines that shrink swollen tissue in the nose. Medicines that treat allergies (antihistamines). A spray that treats swelling of the nostrils.  Rinses that help get rid of thick mucus in your nose (nasal saline washes). If caused by bacteria, your doctor may wait to see if you will get better without treatment. You may be given antibiotic medicine if you have: A very bad infection. A weak body defense system. If caused by growths in the nose, you may need to have surgery. Follow these instructions at home: Medicines Take, use, or apply over-the-counter and prescription medicines only as told by your doctor. These may include nasal sprays. If you were prescribed an antibiotic medicine, take it as told by your doctor. Do not stop taking the antibiotic even if you start to feel better. Hydrate and humidify  Drink enough water to keep your pee (urine) pale yellow. Use a cool mist humidifier to keep the humidity level in your home above 50%. Breathe in steam for 10-15 minutes, 3-4 times a day, or as told by your doctor. You can do this in the bathroom while a hot shower is running. Try not to spend time in cool or dry air. Rest Rest as much as you  can. Sleep with your head raised (elevated). Make sure you get enough sleep each night. General instructions  Put a warm, moist washcloth on your face 3-4 times a day, or as often as told by your doctor. This will help with discomfort. Wash your hands often with soap and water. If there is no soap and water, use hand sanitizer. Do not smoke. Avoid being around people who are smoking  (secondhand smoke). Keep all follow-up visits as told by your doctor. This is important. Contact a doctor if: You have a fever. Your symptoms get worse. Your symptoms do not get better within 10 days. Get help right away if: You have a very bad headache. You cannot stop throwing up (vomiting). You have very bad pain or swelling around your face or eyes. You have trouble seeing. You feel confused. Your neck is stiff. You have trouble breathing. Summary Sinusitis is swelling of your sinuses. Sinuses are hollow spaces in the bones around your face. This condition is caused by tissues in your nose that become inflamed or swollen. This traps germs. These can lead to infection. If you were prescribed an antibiotic medicine, take it as told by your doctor. Do not stop taking it even if you start to feel better. Keep all follow-up visits as told by your doctor. This is important. This information is not intended to replace advice given to you by your health care provider. Make sure you discuss any questions you have with your health care provider. Document Revised: 12/22/2017 Document Reviewed: 12/22/2017 Elsevier Patient Education  2022 [employer].

## 2021-06-04 NOTE — Progress Notes (Signed)
    SUBJECTIVE:   CHIEF COMPLAINT / HPI: Cough  Patient reports he has been experiencing a cough that he thinks is related to his allergies.  He reports feeling some nasal congestion as well as sinus congestion intermittently.  He reports that he is not sure what medications to take as he is on Tikosyn.  Patient has been taking cetirizine in the past but has not noticed much change with his allergy symptoms.  He states that he has seen ENT in the distant past but nothing recently.  He denies any fevers, chills, body aches, nausea or vomiting associated with his symptoms.  He does not have a headache.  PERTINENT  PMH / PSH:  AF HTN  Gout  Obesity   OBJECTIVE:   BP 133/78   Pulse 83   Wt (!) 328 lb (148.8 kg)   SpO2 98%   BMI 42.11 kg/m   Physical Exam Constitutional:      General: He is not in acute distress.    Appearance: Normal appearance. He is obese. He is not ill-appearing or toxic-appearing.  HENT:     Right Ear: Tympanic membrane and external ear normal. There is no impacted cerumen.     Left Ear: Tympanic membrane and external ear normal. There is no impacted cerumen.     Nose:     Comments: Bilaterally enlarged turbinates    Mouth/Throat:     Mouth: Mucous membranes are moist.     Pharynx: No oropharyngeal exudate or posterior oropharyngeal erythema.  Eyes:     General: No scleral icterus.       Right eye: No discharge.        Left eye: No discharge.     Extraocular Movements: Extraocular movements intact.     Conjunctiva/sclera: Conjunctivae normal.  Cardiovascular:     Rate and Rhythm: Normal rate and regular rhythm.     Pulses: Normal pulses.     Heart sounds: Normal heart sounds.  Pulmonary:     Effort: Pulmonary effort is normal.     Breath sounds: Normal breath sounds. No wheezing or rales.  Abdominal:     General: Bowel sounds are normal.     Tenderness: There is no abdominal tenderness.  Musculoskeletal:     Cervical back: Normal range of motion. No  tenderness.  Lymphadenopathy:     Cervical: No cervical adenopathy.  Neurological:     Mental Status: He is alert.    ASSESSMENT/PLAN:   Sinusitis Referral to ENT Switch cetirizine to [place], MD [place]

## 2021-06-04 NOTE — Telephone Encounter (Signed)
patient would like recommendations on what to take with Tikosyn. Roderic Palau stated that Robitussin, Antihistamines, non decongestants if symptoms lasts longer than 4 days to reach out to PCP.

## 2021-06-06 NOTE — Assessment & Plan Note (Signed)
Referral to ENT Switch cetirizine to [facility]

## 2021-06-14 ENCOUNTER — Other Ambulatory Visit: Payer: Self-pay

## 2021-06-15 MED ORDER — TAMSULOSIN HCL 0.4 MG PO CAPS: 0.4000 mg | ORAL_CAPSULE | Freq: Every day | ORAL | 3 refills | Status: DC

## 2021-06-18 ENCOUNTER — Other Ambulatory Visit: Payer: Self-pay | Admitting: Family Medicine

## 2021-07-05 ENCOUNTER — Other Ambulatory Visit (HOSPITAL_COMMUNITY): Payer: Self-pay | Admitting: Nurse Practitioner

## 2021-07-10 ENCOUNTER — Other Ambulatory Visit (HOSPITAL_COMMUNITY): Payer: Self-pay | Admitting: *Deleted

## 2021-07-10 MED ORDER — METOPROLOL SUCCINATE ER 25 MG PO TB24: 12.5000 mg | ORAL_TABLET | Freq: Every day | ORAL | 3 refills | Status: DC

## 2021-07-12 ENCOUNTER — Other Ambulatory Visit (HOSPITAL_COMMUNITY): Payer: Self-pay | Admitting: *Deleted

## 2021-07-12 MED ORDER — DOFETILIDE 500 MCG PO CAPS: 500.0000 ug | ORAL_CAPSULE | Freq: Two times a day (BID) | ORAL | 0 refills | Status: DC

## 2021-08-07 ENCOUNTER — Other Ambulatory Visit: Payer: Self-pay | Admitting: Physician Assistant

## 2021-08-27 ENCOUNTER — Other Ambulatory Visit: Payer: Self-pay

## 2021-08-27 ENCOUNTER — Ambulatory Visit (HOSPITAL_COMMUNITY)
Admission: RE | Admit: 2021-08-27 | Discharge: 2021-08-27 | Disposition: A | Discharge status: Home / Self Care | Payer: 59 | Source: Ambulatory Visit | Attending: Physician Assistant | Admitting: Physician Assistant

## 2021-08-27 VITALS — BP 136/76 | HR 49 | Ht 74.0 in | Wt 325.0 lb

## 2021-08-27 DIAGNOSIS — Z6841 Body Mass Index (BMI) 40.0 and over, adult: Secondary | ICD-10-CM | POA: Insufficient documentation

## 2021-08-27 DIAGNOSIS — G4733 Obstructive sleep apnea (adult) (pediatric): Secondary | ICD-10-CM | POA: Insufficient documentation

## 2021-08-27 DIAGNOSIS — R0789 Other chest pain: Secondary | ICD-10-CM | POA: Diagnosis not present

## 2021-08-27 DIAGNOSIS — R001 Bradycardia, unspecified: Secondary | ICD-10-CM | POA: Diagnosis not present

## 2021-08-27 DIAGNOSIS — I10 Essential (primary) hypertension: Secondary | ICD-10-CM | POA: Diagnosis not present

## 2021-08-27 DIAGNOSIS — I480 Paroxysmal atrial fibrillation: Secondary | ICD-10-CM | POA: Insufficient documentation

## 2021-08-27 DIAGNOSIS — E669 Obesity, unspecified: Secondary | ICD-10-CM | POA: Insufficient documentation

## 2021-08-27 DIAGNOSIS — Z7901 Long term (current) use of anticoagulants: Secondary | ICD-10-CM | POA: Diagnosis not present

## 2021-08-27 LAB — BASIC METABOLIC PANEL
Anion gap: 6 (ref 5–15)
BUN: 10 mg/dL (ref 6–20)
CO2: 26 mmol/L (ref 22–32)
Calcium: 8.8 mg/dL — ABNORMAL LOW (ref 8.9–10.3)
Chloride: 108 mmol/L (ref 98–111)
Creatinine, Ser: 1.01 mg/dL (ref 0.61–1.24)
GFR, Estimated: >60 mL/min (ref >60)
Glucose, Bld: 89 mg/dL (ref 70–99)
Potassium: 4.2 mmol/L (ref 3.5–5.1)
Sodium: 140 mmol/L (ref 135–145)

## 2021-08-27 LAB — MAGNESIUM: Magnesium: 2.2 mg/dL (ref 1.7–2.4)

## 2021-08-27 NOTE — Progress Notes (Signed)
Patient ID: [name], male   DOB: 25-Apr-1966, 56 y.o.   MRN: [id number]     Primary Care Physician: [name], MD Referring Physician: Dr. Rayann Heman, [facility] f/u Cardiologist: Dr. Leonides Cave is a 56 y.o. male with a h/o PAF on  tikosyn. He feels that mostly he is staying in [place]. He did have afib last night after eating a high salt dinner at [facility]. He is in normal rhythm today. He is being compliant with Tikosyn/ eliquis. No bleeding issues.  He is using cpap. He is back at [employer] on the machines with social distancing in place.   F/u in afib clinic, 10/26/29. He asked to be seen today as he will notice fluttering in his chest but his HR will be lower in the 40's. He did notice one episode of afib with HR's in the 150's x 1. He notices more shortness of breath when he has the fluttering/slow HR spells. No change in health. Eye drop Xiidra, added 2 weeks ago for dry eye. Continues on eliquis 5 mg bid for a CHA2DS2VASc score of 1.   F/u in the afib clinic, 5/3,  for f/u ER visit 4/7,  for chest pain. It was not felt to be cardiac in origin. He was in afib in the ER but converted. The  chest pain  was not associated with afib over the preceding days at home with same. Dr. Burt Knack saw on consult in the ER and cleared to go home. He wore a monitor that showed 7% afib burden. HE feels the chest pain was form stress dealing with his job. 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 He is not an ablation candidate for his weight at 328 lbs today. He is   back on [facility] for his weight loss efforts. He lost 50 lbs one time doing [employer], but went on vacation and got off [place] at that time.   F/u afib clinic, 12/12/20. He is in SR today. He has ongoing   atypical chest pain. Recent cardiac CT showed no significant coronary disease so doubt coronary origin. He tells me he has recently reduced his omeprazole to once a day and has noted citric food will make it  worse. He has also increased coffee intake. He denies ever having an endoscopy. He does lift heavy weights and another origin of pain may be MS. He often will notice on sitting to standing. He remains on tikosyn and eliquis.   Follow up in the AF clinic 08/27/21. Patient reports that he has done well since his last visit. He did have one episode of afib starting in the evening and lasting into the next day. He denies any bleeding issues on anticoagulation.   Today, he denies symptoms of palpitations, chest pain, shortness of breath, orthopnea, PND, lower extremity edema, dizziness, presyncope, syncope, or neurologic sequela. Positive for back pain.The patient is tolerating medications without difficulties and is otherwise without complaint today.   Past Medical History:  Diagnosis Date   Allergy    Arthritis    "knees" (01/22/2017)   Cataract    Chronic lower back pain    GERD (gastroesophageal reflux disease)    Gout    "no flares in awhile" (01/22/2017)   History of MRI of cervical spine 09/25/2010   normal; left shoulder normal as well   Hypertension    Migraine    "~ q 6 months" (  01/22/2017)   Morbid obesity ([place])    "I've lost 43# in 15 weeks w/weight watchers" (01/22/2017)   OSA on CPAP    Paroxysmal atrial fibrillation ([place]) 02/2014   chads2vasc score is 1   Past Surgical History:  Procedure Laterality Date   CARDIAC CATHETERIZATION  05/2005   "no blockage"   KNEE ARTHROSCOPY Right 02/2013   LEFT HEART CATH AND CORONARY ANGIOGRAPHY N/A 01/27/2017   Procedure: Left Heart Cath and Coronary Angiography;  Surgeon: Troy Sine, MD;  Location: [place] [facility];  Service: Cardiovascular;  Laterality: N/A;    Current Outpatient Medications  Medication Sig Dispense Refill   apixaban (ELIQUIS) 5 MG TABS tablet Take 1 tablet (5 mg total) by mouth 2 (two) times daily. 180 tablet 3   atorvastatin (LIPITOR) 20 MG tablet Take 1 tablet (20 mg total) by mouth every other day. 30 tablet 1    diclofenac Sodium (VOLTAREN) 1 % GEL Apply 2 g topically daily as needed (For pain). 50 g 1   dofetilide (TIKOSYN) 500 MCG capsule Take 1 capsule (500 mcg total) by mouth 2 (two) times daily. Appointment Required For Further Refills [phone] 180 capsule 0   fexofenadine (ALLEGRA ALLERGY) 60 MG tablet Take 1 tablet (60 mg total) by mouth daily. (Patient taking differently: Take 60 mg by mouth as needed.) 90 tablet 1   fluticasone (FLONASE) 50 MCG/ACT nasal spray Place 2 sprays into both nostrils daily. 16 g 6   losartan (COZAAR) 50 MG tablet Take 1 tablet by mouth once daily 90 tablet 3   metoprolol succinate (TOPROL XL) 25 MG 24 hr tablet Take 0.5 tablets (12.5 mg total) by mouth daily. 30 tablet 3   MITIGARE 0.6 MG CAPS Take 1 capsule by mouth once daily 90 capsule 0   Multiple Vitamin (MULTIVITAMIN WITH MINERALS) TABS tablet Take 1 tablet by mouth at bedtime.     Omega-3 Fatty Acids (FISH OIL) 1000 MG CAPS Take 1,000 mg by mouth in the morning.     omeprazole (PRILOSEC) 20 MG capsule Take 20 mg by mouth 2 (two) times daily.     TURMERIC PO Take 1 capsule by mouth every other day.      omeprazole (PRILOSEC) 40 MG capsule Take 1 capsule (40 mg total) by mouth 2 (two) times daily before a meal. (Patient not taking: Reported on 08/27/2021) 180 capsule 0   tamsulosin (FLOMAX) 0.4 MG CAPS capsule Take 1 capsule (0.4 mg total) by mouth daily. (Patient not taking: Reported on 08/27/2021) 30 capsule 3   No current facility-administered medications for this encounter.    Allergies  Allergen Reactions   Hydrochlorothiazide-Propranolol [Propranolol-Hctz] Other (See Comments)    Joints lock up   Penicillins Other (See Comments)    Hives Makes joints stiff and unable to move Has patient had a PCN reaction causing immediate rash, facial/tongue/throat swelling, SOB or lightheadedness with hypotension: No Has patient had a PCN reaction causing severe rash involving mucus membranes or skin necrosis:  No Has patient had a PCN reaction that required hospitalization No Has patient had a PCN reaction occurring within the last 10 years: No If all of the above answers are "NO", then may proceed with Cephalosporin use.   Hydrochlorothiazide     Social History   Socioeconomic History   Marital status: Married    Spouse name: Not on file   Number of children: 3   Years of education: Not on file   Highest education level: Not on file  Occupational History   Not on file  Tobacco Use   Smoking status: Former    Packs/day: 1.00    Years: 10.00    Pack years: 10.00    Types: Cigarettes    Quit date: 04/02/1994    Years since quitting: 27.4   Smokeless tobacco: Former    Types: Chew   Tobacco comments:    "chewed in my teens"  Vaping Use   Vaping Use: Never used  Substance and Sexual Activity   Alcohol use: No    Alcohol/week: 0.0 standard drinks    Comment: "stopped in 1994"   Drug use: Not Currently    Types: Marijuana    Comment: "stopped marijuna in 1994"   Sexual activity: Yes  Other Topics Concern   Not on file  Social History Narrative   Lives in [place] with spouse.  3 children, all grown.   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   Social Determinants of Radio broadcast assistant Strain: Not on [employer] Insecurity: Not on file  Transportation Needs: Not on file  Physical Activity: Not on file  Stress: Not on file  Social Connections: Not on file  Intimate Partner Violence: Not on file    Family History  Problem Relation Age of Onset   Atrial fibrillation Mother 40       had a stroke   Hypertension Father    Diabetes Father    Colon cancer Neg Hx    Esophageal cancer Neg Hx    Stomach cancer Neg Hx     ROS- All systems are reviewed and negative except as per the HPI above  Physical Exam: Vitals:   08/27/21 1348  BP: 136/76  Pulse: (!) 49  Weight: (!) 147.4 kg  Height: 6\' 2"  (1.88 m)    GEN- The patient is a well  appearing obese male, alert and oriented x 3 today.   HEENT-head normocephalic, atraumatic, sclera clear, conjunctiva pink, hearing intact, trachea midline. Lungs- Clear to ausculation bilaterally, normal work of breathing Heart- Regular rate and rhythm, bradycardia, no murmurs, rubs or gallops  GI- soft, NT, ND, + BS Extremities- no clubbing, cyanosis, or edema MS- no significant deformity or atrophy Skin- no rash or lesion Psych- euthymic mood, full affect Neuro- strength and sensation are intact   EKG- SB Vent. rate 49 BPM PR interval 194 ms QRS duration 98 ms QT/QTcB 446/402 ms   Cardiac CT-IMPRESSION: 1. Coronary calcium score of 0.384. This was 59th percentile for age, sex, and race matched control.   2. Normal coronary origin. Left dominance.   3. CAD-RADS 1. Minimal non-obstructive CAD (1-24%). Consider non-atherosclerotic causes of chest pain. Consider preventive therapy and risk factor modification.   Assessment and Plan: 1. Paroxysmal afib Patient doing well in SR. Continue dofetilide 500 mg BID. QT stable. Check bmet/mag today. Continue Toprol 12.5 mg daily Continue Eliquis 5 mg BID  2. Obesity Body mass index is 41.73 kg/m. Lifestyle modification was discussed and encouraged including regular physical activity and weight reduction.  3. HTN Stable, no changes today.  4.  CHA2DS2VASc score of 1 Continue Eliquis 5 mg bid   5. Atypical Chest pain Ongoing  CT- no significant calcification of coronary arteries   6. OSA Patient reports compliance with CPAP therapy.  7. Bradycardia Longstanding, asymptomatic   Follow up in the AF clinic in 6 months.    [facility] [address]  [place], [place] [address] [phone]

## 2021-09-10 ENCOUNTER — Other Ambulatory Visit: Payer: Self-pay | Admitting: Family Medicine

## 2021-09-21 ENCOUNTER — Other Ambulatory Visit: Payer: Self-pay

## 2021-09-21 ENCOUNTER — Ambulatory Visit (INDEPENDENT_AMBULATORY_CARE_PROVIDER_SITE_OTHER): Payer: 59 | Admitting: Family Medicine

## 2021-09-21 VITALS — BP 130/84 | HR 62 | Wt 323.4 lb

## 2021-09-21 DIAGNOSIS — J069 Acute upper respiratory infection, unspecified: Secondary | ICD-10-CM | POA: Diagnosis not present

## 2021-09-21 NOTE — Progress Notes (Signed)
° ° °  SUBJECTIVE:   CHIEF COMPLAINT / HPI:   Mr. Broughton is a 56 yo who presents with headache, sore throat, congestion   States bad headache, drainage, cough, throat started hurting starting 3 days ago on Tuesday. Went to work Wed and had to leave early. Denies nausea, vomiting. Denies body aches. Does have chest wall soreness from coughing. Able to tolerate oral intake. Took COVID test at home this week and tested negative   Also states his gout has flared up  OBJECTIVE:   BP 130/84    Pulse 62    Wt (!) 323 lb 6.4 oz (146.7 kg)    SpO2 99%    BMI 41.52 kg/m    Physical exam General: well appearing, NAD Cardiovascular: RRR, no murmurs Lungs: CTAB. Normal WOB Abdomen: soft, non-distended, non-tender MSK: tenderness to palpation along sternum  Skin: warm, dry. No edema  ASSESSMENT/PLAN:   No problem-specific Assessment & Plan notes found for this encounter.   Cough and congestion Likely due to a viral infection. Tested negative at home for COVID so opted not to re test today. Physical exam unremarkable. Discussed symptomatic management and supportive care. Discussed return precautions if symptoms do not improve. Provided note for work .   [place]

## 2021-09-21 NOTE — Patient Instructions (Addendum)
It was great seeing you today!  You came in for cough, congestion, headache. This is most likely a viral infection. This will take time to get over. The treatment for this is supportive care. You can use Tylenol for pain or fever, Flonase once daily for congestion, Mucinex, throat lozenges, humidifier.   Please return for recurrent symptoms that are not improving after 2 weeks, or any new and concerning symptoms to you.    Feel free to call with any questions or concerns at any time, at [phone].   Take care,  Dr. Shary Key [facility]

## 2021-10-11 ENCOUNTER — Other Ambulatory Visit (HOSPITAL_COMMUNITY): Payer: Self-pay | Admitting: Nurse Practitioner

## 2021-10-25 ENCOUNTER — Ambulatory Visit: Payer: 59 | Admitting: Family Medicine

## 2021-10-25 ENCOUNTER — Other Ambulatory Visit: Payer: Self-pay

## 2021-10-25 ENCOUNTER — Encounter: Payer: Self-pay | Admitting: Family Medicine

## 2021-10-25 VITALS — BP 137/78 | HR 61 | Ht 74.0 in | Wt 328.0 lb

## 2021-10-25 DIAGNOSIS — M25572 Pain in left ankle and joints of left foot: Secondary | ICD-10-CM | POA: Diagnosis not present

## 2021-10-25 NOTE — Assessment & Plan Note (Signed)
-  Concern for gout so will obtain uric acid level. No recent trauma or injury but if urine acid level wnl then will consider imaging to assess for gross abnormality. No signs of systemic symptoms and exam not concerning for infectious etiology such as cellulitis. Low concern for DVT given stable vital signs and lack of significant LE edema and tenderness. Low concern for compartment syndrome given history and exam inconsistent with pulseless, pallor and pain out of proportion. ?-Recommended compression stockings with elevation of extremities as often as possible ?-Avoid NSAIDs given on long-term eliquis for history of atrial fibrillation  ?-Follow up and next steps of treatment based on uric acid level which is pending  ?

## 2021-10-25 NOTE — Patient Instructions (Signed)
It was great seeing you today! ? ?Today we discussed your ankle pain. We will get blood work to check your uric acid level which is often high in gout. Based on these results we will discuss next steps. If it is high, we will treat with more colchicine. If it is normal, then we can consider imaging that ankle to make sure the bones are normal.  ? ?Please elevate your legs and wearing compression stockings can help.  ? ?Please follow up at your next scheduled appointment, if anything arises between now and then, please don't hesitate to contact our office. ? ? ?Thank you for allowing [place] to be a part of your medical care! ? ?Thank you, ?Dr. Larae Grooms  ?

## 2021-10-25 NOTE — Progress Notes (Signed)
? ? ?  SUBJECTIVE:  ? ?CHIEF COMPLAINT / HPI:  ? ?Patient with history of gout presents with left ankle pain that started yesterday. Takes mitigare daily as prescribed. This has occurred before and it ended up being a gout flare that helped last time. Denies fever, chills, nausea, vomiting, numbness, tingling and radiating pain. Rates 4/10. Aggravating factors include walking and movement which can increase pain intensity to 8/10. Relieving factors include resting. No recent trauma or injury. Denies alcohol consumption. Ate hamburgers the night before last, eats primarily chicken but wife has not been feeling good recently so they have been eating a lot of fast food. He has been on his feet a lot recently.  ? ?OBJECTIVE:  ? ?BP 137/78   Pulse 61   Ht '6\' 2"'$  (1.88 m)   Wt (!) 328 lb (148.8 kg)   SpO2 98%   BMI 42.11 kg/m?   ?General: Patient well-appearing, in no acute distress. ?CV: RRR, no murmurs or gallops auscultated ?Resp: CTAB, no wheezing, rales or rhonchi noted ?Ext: mild edema noted along both ankles bilaterally L>R, very mild pitting edema noted bilaterally, no associated erythema or skin changes, distal pulses strong and equal bilaterally, no lesions or rash noted ? ?ASSESSMENT/PLAN:  ? ?Acute left ankle pain ?-Concern for gout so will obtain uric acid level. No recent trauma or injury but if urine acid level wnl then will consider imaging to assess for gross abnormality. No signs of systemic symptoms and exam not concerning for infectious etiology such as cellulitis. Low concern for DVT given stable vital signs and lack of significant LE edema and tenderness. Low concern for compartment syndrome given history and exam inconsistent with pulseless, pallor and pain out of proportion. ?-Recommended compression stockings with elevation of extremities as often as possible ?-Avoid NSAIDs given on long-term eliquis for history of atrial fibrillation  ?-Follow up and next steps of treatment based on uric acid  level which is pending  ?  ? ?-PHQ-9 score of 2 with negative question 9 reviewed.  ? ?Donney Dice, DO ?[facility]  ?

## 2021-10-26 ENCOUNTER — Telehealth: Payer: Self-pay

## 2021-10-26 LAB — URIC ACID: Uric Acid: 6.9 mg/dL (ref 3.8–8.4)

## 2021-10-26 NOTE — Telephone Encounter (Signed)
Patient calls nurse line checking the status of test results and plan.  ? ?Will forward to provider who saw patient.  ?

## 2021-10-29 ENCOUNTER — Other Ambulatory Visit: Payer: Self-pay | Admitting: Family Medicine

## 2021-10-29 ENCOUNTER — Ambulatory Visit
Admission: RE | Admit: 2021-10-29 | Discharge: 2021-10-29 | Disposition: A | Discharge status: Home / Self Care | Payer: 59 | Source: Ambulatory Visit | Attending: Family Medicine | Admitting: Family Medicine

## 2021-10-29 DIAGNOSIS — M25572 Pain in left ankle and joints of left foot: Secondary | ICD-10-CM

## 2021-10-31 ENCOUNTER — Other Ambulatory Visit: Payer: Self-pay | Admitting: Family Medicine

## 2021-10-31 ENCOUNTER — Encounter: Payer: Self-pay | Admitting: Family Medicine

## 2021-10-31 DIAGNOSIS — M25572 Pain in left ankle and joints of left foot: Secondary | ICD-10-CM

## 2021-10-31 MED ORDER — DICLOFENAC SODIUM 1 % EX GEL: 2.0000 g | Freq: Every day | CUTANEOUS | 1 refills | Status: DC | PRN

## 2021-11-06 ENCOUNTER — Ambulatory Visit: Payer: 59 | Admitting: Family Medicine

## 2021-11-06 VITALS — BP 135/82 | Ht 74.0 in | Wt 323.0 lb

## 2021-11-06 DIAGNOSIS — G8929 Other chronic pain: Secondary | ICD-10-CM

## 2021-11-06 DIAGNOSIS — M25572 Pain in left ankle and joints of left foot: Secondary | ICD-10-CM

## 2021-11-06 DIAGNOSIS — M25512 Pain in left shoulder: Secondary | ICD-10-CM | POA: Diagnosis not present

## 2021-11-06 NOTE — Patient Instructions (Signed)
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 ?Arch supports are very important for this even with your good Brooks shoes. ?Icing 15 minutes at a time as needed 3-4 times a day. ?Voltaren gel up to 4 times a day as needed. ?Do home exercises daily as directed. ? ?Your neck/shoulder pain is due to trapezius strain. ?Heat (or ice if this feels better) 15 minutes at a time as needed. ?Voltaren gel topically up to 4 times a day. ?Do home exercises/stretches as directed. ?Expect this to take 2-3 weeks to resolve. ?Follow up with me in 1 month for both issues. ?

## 2021-11-07 ENCOUNTER — Encounter: Payer: Self-pay | Admitting: Family Medicine

## 2021-11-07 NOTE — Progress Notes (Signed)
PCP: [name], MD ? ?Subjective:  ? ?HPI: ?Patient is a 56 y.o. male here for left shoulder and ankle pain. ? ?Patient reports for several months he has had medial left foot and ankle pain. ?Denies any injury or trauma. ?Pain is worse with walking and standing for prolonged periods. ?Some associated swelling but no bruising. ?He also just this morning started to get pain on the left side of his neck and shoulder. ?No acute injury or trauma with this. ?No radiation down his left upper extremity. ?No swelling or bruising, numbness or tingling. ? ?Past Medical History:  ?Diagnosis Date  ? Allergy   ? Arthritis   ? "knees" (01/22/2017)  ? Cataract   ? Chronic lower back pain   ? GERD (gastroesophageal reflux disease)   ? Gout   ? "no flares in awhile" (01/22/2017)  ? History of MRI of cervical spine 09/25/2010  ? normal; left shoulder normal as well  ? Hypertension   ? Migraine   ? "~ q 6 months" (01/22/2017)  ? Morbid obesity ([place])   ? "I've lost 43# in 15 weeks w/weight watchers" (01/22/2017)  ? OSA on CPAP   ? Paroxysmal atrial fibrillation ([place]) 02/2014  ? chads2vasc score is 1  ? ? ?Current Outpatient Medications on File Prior to Visit  ?Medication Sig Dispense Refill  ? apixaban (ELIQUIS) 5 MG TABS tablet Take 1 tablet (5 mg total) by mouth 2 (two) times daily. 180 tablet 3  ? atorvastatin (LIPITOR) 20 MG tablet Take 1 tablet (20 mg total) by mouth every other day. 30 tablet 1  ? diclofenac Sodium (VOLTAREN) 1 % GEL Apply 2 g topically daily as needed (For pain). 50 g 1  ? dofetilide (TIKOSYN) 500 MCG capsule Take 1 capsule (500 mcg total) by mouth 2 (two) times daily. 180 capsule 1  ? fexofenadine (ALLEGRA ALLERGY) 60 MG tablet Take 1 tablet (60 mg total) by mouth daily. (Patient taking differently: Take 60 mg by mouth as needed.) 90 tablet 1  ? fluticasone (FLONASE) 50 MCG/ACT nasal spray Place 2 sprays into both nostrils daily. 16 g 6  ? losartan (COZAAR) 50 MG tablet Take 1 tablet by mouth once daily  90 tablet 3  ? metoprolol succinate (TOPROL XL) 25 MG 24 hr tablet Take 0.5 tablets (12.5 mg total) by mouth daily. 30 tablet 3  ? MITIGARE 0.6 MG CAPS Take 1 capsule by mouth once daily 90 capsule 0  ? Multiple Vitamin (MULTIVITAMIN WITH MINERALS) TABS tablet Take 1 tablet by mouth at bedtime.    ? Omega-3 Fatty Acids (FISH OIL) 1000 MG CAPS Take 1,000 mg by mouth in the morning.    ? omeprazole (PRILOSEC) 20 MG capsule Take 20 mg by mouth 2 (two) times daily.    ? omeprazole (PRILOSEC) 40 MG capsule Take 1 capsule (40 mg total) by mouth 2 (two) times daily before a meal. (Patient not taking: Reported on 08/27/2021) 180 capsule 0  ? tamsulosin (FLOMAX) 0.4 MG CAPS capsule Take 1 capsule (0.4 mg total) by mouth daily. (Patient not taking: Reported on 08/27/2021) 30 capsule 3  ? TURMERIC PO Take 1 capsule by mouth every other day.     ? ?No current facility-administered medications on file prior to visit.  ? ? ?Past Surgical History:  ?Procedure Laterality Date  ? CARDIAC CATHETERIZATION  05/2005  ? "no blockage"  ? KNEE ARTHROSCOPY Right 02/2013  ? LEFT HEART CATH AND CORONARY ANGIOGRAPHY N/A 01/27/2017  ? Procedure: Left  Heart Cath and Coronary Angiography;  Surgeon: Troy Sine, MD;  Location: [facility];  Service: Cardiovascular;  Laterality: N/A;  ? ? ?Allergies  ?Allergen Reactions  ? Hydrochlorothiazide-Propranolol [Propranolol-Hctz] Other (See Comments)  ?  Joints lock up  ? Penicillins Other (See Comments)  ?  Hives ?Makes joints stiff and unable to move ?Has patient had a PCN reaction causing immediate rash, facial/tongue/throat swelling, SOB or lightheadedness with hypotension: No ?Has patient had a PCN reaction causing severe rash involving mucus membranes or skin necrosis: No ?Has patient had a PCN reaction that required hospitalization No ?Has patient had a PCN reaction occurring within the last 10 years: No ?If all of the above answers are "NO", then may proceed with Cephalosporin use.  ?  Hydrochlorothiazide   ? ? ?BP 135/82   Ht '6\' 2"'$  (1.88 m)   Wt (!) 323 lb (146.5 kg)   BMI 41.47 kg/m?  ? ? ?  10/09/2020  ?  9:33 AM  ?[place] Adult Exercise  ?Frequency of aerobic exercise (# of days/week) 0  ?Average time in minutes 0  ?Frequency of strengthening activities (# of days/week) 0  ? ? ?   ? View : No data to display.  ?  ?  ?  ? ? ?    ?Objective:  ?Physical Exam: ? ?Gen: NAD, comfortable in exam room ? ?Left foot/ankle: ?Mod overpronation.  Mild diffuse swelling.  No other gross deformity, ecchymoses ?FROM with pain on internal rotation. ?TTP medially through post tib tendon course, less achilles tendon ?Negative ant drawer and negative talar tilt.   ?Negative syndesmotic compression. ?Thompsons test negative. ?NV intact distally. ?  ?Neck: ?No gross deformity, swelling, bruising. ?TTP left trapezius.  No midline/bony TTP. ?FROM with pain on right lateral rotation. ?BUE strength 5/5.   ?Sensation intact to light touch.   ?NV intact distal BUEs. ? ?Left shoulder: ?No swelling, ecchymoses.  No gross deformity. ?No TTP. ?FROM without pain. ?Negative Hawkins, Neers. ?Negative Yergasons. ?Strength 5/5 with empty can and resisted internal/external rotation. ?NV intact distally. ? ? ?Assessment & Plan:  ?1.  Left ankle pain: Secondary to posterior tibialis tendinopathy, dysfunction.  Stressed importance of arch supports.  Home exercises reviewed.  Icing, Voltaren gel. ? ?2.  Left trapezius strain: Heat, Voltaren gel, home exercises and stretches reviewed.  Expect this to take 2 to 3 weeks to resolve.  Follow-up in 1 month for both issues. ?

## 2021-11-22 ENCOUNTER — Encounter (HOSPITAL_COMMUNITY): Payer: Self-pay | Admitting: Nurse Practitioner

## 2021-11-22 ENCOUNTER — Ambulatory Visit (HOSPITAL_COMMUNITY)
Admission: RE | Admit: 2021-11-22 | Discharge: 2021-11-22 | Disposition: A | Discharge status: Home / Self Care | Payer: 59 | Source: Ambulatory Visit | Attending: Nurse Practitioner | Admitting: Nurse Practitioner

## 2021-11-22 VITALS — BP 136/82 | HR 52 | Ht 74.0 in | Wt 327.6 lb

## 2021-11-22 DIAGNOSIS — R0789 Other chest pain: Secondary | ICD-10-CM | POA: Diagnosis not present

## 2021-11-22 DIAGNOSIS — E669 Obesity, unspecified: Secondary | ICD-10-CM | POA: Insufficient documentation

## 2021-11-22 DIAGNOSIS — Z7901 Long term (current) use of anticoagulants: Secondary | ICD-10-CM | POA: Diagnosis not present

## 2021-11-22 DIAGNOSIS — I10 Essential (primary) hypertension: Secondary | ICD-10-CM | POA: Insufficient documentation

## 2021-11-22 DIAGNOSIS — G4733 Obstructive sleep apnea (adult) (pediatric): Secondary | ICD-10-CM | POA: Diagnosis not present

## 2021-11-22 DIAGNOSIS — D6869 Other thrombophilia: Secondary | ICD-10-CM

## 2021-11-22 DIAGNOSIS — I480 Paroxysmal atrial fibrillation: Secondary | ICD-10-CM | POA: Diagnosis present

## 2021-11-22 DIAGNOSIS — Z6841 Body Mass Index (BMI) 40.0 and over, adult: Secondary | ICD-10-CM | POA: Diagnosis not present

## 2021-11-22 DIAGNOSIS — R001 Bradycardia, unspecified: Secondary | ICD-10-CM | POA: Diagnosis not present

## 2021-11-22 DIAGNOSIS — Z79899 Other long term (current) drug therapy: Secondary | ICD-10-CM | POA: Insufficient documentation

## 2021-11-22 NOTE — Addendum Note (Signed)
Encounter addended by: Enid Derry, CMA on: 11/22/2021 10:49 AM ? Actions taken: Order Reconciliation Section accessed, Home Medications modified

## 2021-11-22 NOTE — Progress Notes (Signed)
Patient ID: [name], male   DOB: 10/07/1965, 56 y.o.   MRN: [id number] ? ? ? ? ?Primary Care Physician: [name], MD ?Referring Physician: Dr. Rayann Heman, [facility] f/u ?Cardiologist: Dr. Acie Fredrickson ? ? ?[name] is a 56 y.o. male with a h/o PAF on  tikosyn. He feels that mostly he is staying in [place]. He did have afib last night after eating a high salt dinner at [facility]. He is in normal rhythm today. He is being compliant with Tikosyn/ eliquis. No bleeding issues.  He is using cpap. He is back at [employer] on the machines with social distancing in place.  ? ?F/u in afib clinic, 10/26/29. He asked to be seen today as he will notice fluttering in his chest but his HR will be lower in the 40's. He did notice one episode of afib with HR's in the 150's x 1. He notices more shortness of breath when he has the fluttering/slow HR spells. No change in health. Eye drop Xiidra, added 2 weeks ago for dry eye. Continues on eliquis 5 mg bid for a CHA2DS2VASc score of 1.  ? ?F/u in the afib clinic, 5/3,  for f/u ER visit 4/7,  for chest pain. It was not felt to be cardiac in origin. He was in afib in the ER but converted. The  chest pain  was not associated with afib over the preceding days at home with same. Dr. Burt Knack saw on consult in the ER and cleared to go home. He wore a monitor that showed 7% afib burden. He feels the chest pain was from stress dealing with his job. 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 He is not an ablation candidate for his weight at 328 lbs today. He is   back on [facility] for his weight loss efforts. He lost 50 lbs one time doing [employer], but went on vacation and got off [place] at that time.  ? ?F/u afib clinic, 12/12/20. He is in SR today. He has ongoing   atypical chest pain. Recent cardiac CT showed no significant coronary disease so doubt coronary origin. He tells me he has recently reduced his omeprazole to once a day and has noted citric food will make it  worse. He has also increased coffee intake. He denies ever having an endoscopy. He does lift heavy weights and another origin of pain may be MS. He often will notice on sitting to standing. He remains on tikosyn and eliquis.  ? ?Follow up in the AF clinic 08/27/21. Patient reports that he has done well since his last visit. He did have one episode of afib starting in the evening and lasting into the next day. He denies any bleeding issues on anticoagulation. \ ? ?F/u in afib clinic, 11/22/21. Pt is here as he developed some lightheadedness with left arm feeling tingly, and he felt that he had some palpitations. Someone at work felt  his pulse and said he had some skips. He has noted at work if he lifts his arm above his head for work related activities he feels a tingle and a pull across his chest. He has been evaluated for atypical chest pain in the ER before without significant findings. A CT score was low and a LHC in 2018 did not show significant CAD.   He states that his HR's were in the 60-80's at work yesterday . He is in SR today and  has no complaints. He states that he has had to pick up another job as his hours have been cut back with his current job.  ? ?Today, he denies symptoms of palpitations, chest pain, shortness of breath, orthopnea, PND, lower extremity edema, dizziness, presyncope, syncope, or neurologic sequela. Positive for back pain.The patient is tolerating medications without difficulties and is otherwise without complaint today.  ? ?Past Medical History:  ?Diagnosis Date  ? Allergy   ? Arthritis   ? "knees" (01/22/2017)  ? Cataract   ? Chronic lower back pain   ? GERD (gastroesophageal reflux disease)   ? Gout   ? "no flares in awhile" (01/22/2017)  ? History of MRI of cervical spine 09/25/2010  ? normal; left shoulder normal as well  ? Hypertension   ? Migraine   ? "~ q 6 months" (01/22/2017)  ? Morbid obesity ([place])   ? "I've lost 43# in 15 weeks w/weight watchers" (01/22/2017)  ? OSA on CPAP   ?  Paroxysmal atrial fibrillation ([place]) 02/2014  ? chads2vasc score is 1  ? ?Past Surgical History:  ?Procedure Laterality Date  ? CARDIAC CATHETERIZATION  05/2005  ? "no blockage"  ? KNEE ARTHROSCOPY Right 02/2013  ? LEFT HEART CATH AND CORONARY ANGIOGRAPHY N/A 01/27/2017  ? Procedure: Left Heart Cath and Coronary Angiography;  Surgeon: Troy Sine, MD;  Location: [place] [facility];  Service: Cardiovascular;  Laterality: N/A;  ? ? ?Current Outpatient Medications  ?Medication Sig Dispense Refill  ? apixaban (ELIQUIS) 5 MG TABS tablet Take 1 tablet (5 mg total) by mouth 2 (two) times daily. 180 tablet 3  ? atorvastatin (LIPITOR) 20 MG tablet Take 1 tablet (20 mg total) by mouth every other day. 30 tablet 1  ? diclofenac Sodium (VOLTAREN) 1 % GEL Apply 2 g topically daily as needed (For pain). 50 g 1  ? dofetilide (TIKOSYN) 500 MCG capsule Take 1 capsule (500 mcg total) by mouth 2 (two) times daily. 180 capsule 1  ? fexofenadine (ALLEGRA ALLERGY) 60 MG tablet Take 1 tablet (60 mg total) by mouth daily. (Patient taking differently: Take 60 mg by mouth as needed.) 90 tablet 1  ? fluticasone (FLONASE) 50 MCG/ACT nasal spray Place 2 sprays into both nostrils daily. 16 g 6  ? losartan (COZAAR) 50 MG tablet Take 1 tablet by mouth once daily 90 tablet 3  ? metoprolol succinate (TOPROL XL) 25 MG 24 hr tablet Take 0.5 tablets (12.5 mg total) by mouth daily. 30 tablet 3  ? MITIGARE 0.6 MG CAPS Take 1 capsule by mouth once daily 90 capsule 0  ? Multiple Vitamin (MULTIVITAMIN WITH MINERALS) TABS tablet Take 1 tablet by mouth at bedtime.    ? Omega-3 Fatty Acids (FISH OIL) 1000 MG CAPS Take 1,000 mg by mouth in the morning.    ? omeprazole (PRILOSEC) 40 MG capsule Take 1 capsule (40 mg total) by mouth 2 (two) times daily before a meal. (Patient not taking: Reported on 08/27/2021) 180 capsule 0  ? tamsulosin (FLOMAX) 0.4 MG CAPS capsule Take 1 capsule (0.4 mg total) by mouth daily. (Patient not taking: Reported on 08/27/2021) 30  capsule 3  ? TURMERIC PO Take 1 capsule by mouth every other day.     ? ?No current facility-administered medications for this encounter.  ? ? ?Allergies  ?Allergen Reactions  ? Hydrochlorothiazide-Propranolol [Propranolol-Hctz] Other (See Comments)  ?  Joints lock up  ? Penicillins Other (See Comments)  ?  Hives ?Makes joints stiff and  unable to move ?Has patient had a PCN reaction causing immediate rash, facial/tongue/throat swelling, SOB or lightheadedness with hypotension: No ?Has patient had a PCN reaction causing severe rash involving mucus membranes or skin necrosis: No ?Has patient had a PCN reaction that required hospitalization No ?Has patient had a PCN reaction occurring within the last 10 years: No ?If all of the above answers are "NO", then may proceed with Cephalosporin use.  ? Hydrochlorothiazide   ? ? ?Social History  ? ?Socioeconomic History  ? Marital status: Married  ?  Spouse name: Not on file  ? Number of children: 3  ? Years of education: Not on file  ? Highest education level: Not on file  ?Occupational History  ? Not on file  ?Tobacco Use  ? Smoking status: Former  ?  Packs/day: 1.00  ?  Years: 10.00  ?  Pack years: 10.00  ?  Types: Cigarettes  ?  Quit date: 04/02/1994  ?  Years since quitting: 27.6  ? Smokeless tobacco: Former  ?  Types: Chew  ? Tobacco comments:  ?  "chewed in my teens"  ?Vaping Use  ? Vaping Use: Never used  ?Substance and Sexual Activity  ? Alcohol use: No  ?  Alcohol/week: 0.0 standard drinks  ?  Comment: "stopped in 1994"  ? Drug use: Not Currently  ?  Types: Marijuana  ?  Comment: "stopped marijuna in 1994"  ? Sexual activity: Yes  ?Other Topics Concern  ? Not on file  ?Social History Narrative  ? Lives in [place] with spouse.  3 children, all grown.  ? 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 ?Social Determinants of Health  ? ?Financial Resource Strain: Not on file  ?Food Insecurity: Not on file  ?Transportation Needs: Not on file  ?Physical  Activity: Not on file  ?Stress: Not on file  ?Social Connections: Not on file  ?Intimate Partner Violence: Not on file  ? ? ?Family History  ?Problem Relation Age of Onset  ? Atrial fibrillation Mother 21

## 2021-11-26 ENCOUNTER — Telehealth: Payer: Self-pay | Admitting: Cardiovascular Disease

## 2021-11-26 NOTE — Telephone Encounter (Signed)
STAT if HR is under 50 or over 120 ?(normal HR is 60-100 beats per minute) ? ?What is your heart rate? 65 ? ?Do you have a log of your heart rate readings (document readings)? No, but patient does wear a fitbit that tells him his HR  ? ?Do you have any other symptoms? Patient said he is fatigued. When his HR is low he can get dizzy and start coughing  ? ? ?Patient was put on a new medicine (gabapentin)  by his ENT and was wondering if that could affect his HR  ?

## 2021-11-26 NOTE — Telephone Encounter (Signed)
Spoke with patient regarding low HR/dizziness. ? ?Patient states he has had ongoing issues with his HR being low (50's-60's). He reports he experiences a-fib at times with HR fluctuating between 50-100. He states sometimes he will cough to raise his HR. ? ?Patient states he has CP that he believes is r/t acid reflux (currently taking Omperazole '40mg'$  BID). He states he has SOB with rest, and feels lightheaded/dizzy most of the time. Patient states when he exercises his HR will go up to the 70's and dizziness improves. ? ?Patient also reports feeling drowsy all day. He states he was recently started on Gabapentin '300mg'$  on 11/22/21, denies increased drowsiness since starting this medication. ? ?Patient reports BP yesterday 133/85. ? ?Patient currently takes Tikosyn 530mg BID and Metoprolol succinate 12.'5mg'$  QD. Patient is asking if he may need adjustments made to his medications d/t low HR and dizziness. ? ?Will forward to DRoderic Palau NP to review and advise. ?

## 2021-11-28 NOTE — Telephone Encounter (Signed)
Called and relayed information to patient who states he understands and agrees. He will stop his metoprolol at this time to see if symptoms improve, but will report back to [place] if he begins to have complications with his A-fib. ?

## 2021-12-04 ENCOUNTER — Other Ambulatory Visit (HOSPITAL_COMMUNITY): Payer: Self-pay | Admitting: Nurse Practitioner

## 2021-12-07 ENCOUNTER — Other Ambulatory Visit: Payer: Self-pay | Admitting: Family Medicine

## 2021-12-07 ENCOUNTER — Other Ambulatory Visit: Payer: Self-pay | Admitting: Gastroenterology

## 2021-12-10 ENCOUNTER — Ambulatory Visit (INDEPENDENT_AMBULATORY_CARE_PROVIDER_SITE_OTHER): Payer: 59 | Admitting: Family Medicine

## 2021-12-10 ENCOUNTER — Encounter: Payer: Self-pay | Admitting: Family Medicine

## 2021-12-10 DIAGNOSIS — M76822 Posterior tibial tendinitis, left leg: Secondary | ICD-10-CM | POA: Diagnosis not present

## 2021-12-10 NOTE — Patient Instructions (Signed)
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 ?Arch supports are very important for this even with your good Brooks shoes. ?You can move these green inserts in and out of different shoes - you definitely need them in your steel toe boots. ?Icing 15 minutes at a time as needed 3-4 times a day. ?Do home exercises daily as directed but I would wait about 2 weeks before restarting these. ?Consider formal physical therapy if you're still struggling. ?Follow up with me in 5-6 weeks. ? ?

## 2021-12-10 NOTE — Progress Notes (Signed)
PCP: [name], MD ? ?Subjective:  ? ?HPI: ?Patient is a 56 y.o. male here for left shoulder and ankle pain. ? ?4/4: ?Patient reports for several months he has had medial left foot and ankle pain. ?Denies any injury or trauma. ?Pain is worse with walking and standing for prolonged periods. ?Some associated swelling but no bruising. ?He also just this morning started to get pain on the left side of his neck and shoulder. ?No acute injury or trauma with this. ?No radiation down his left upper extremity. ?No swelling or bruising, numbness or tingling. ? ?5/8: ?Patient reports his left shoulder pain is much better. ?His left ankle had been improving but worsened again past few several days. ?He has been taking tylenol as needed. ?Wearing brooks shoes though at work has steel toe boots. ?Voltaren gel not working much. ?His exercises were a little too painful to do regularly. ?No new injuries. ? ?Past Medical History:  ?Diagnosis Date  ? Allergy   ? Arthritis   ? "knees" (01/22/2017)  ? Cataract   ? Chronic lower back pain   ? GERD (gastroesophageal reflux disease)   ? Gout   ? "no flares in awhile" (01/22/2017)  ? History of MRI of cervical spine 09/25/2010  ? normal; left shoulder normal as well  ? Hypertension   ? Migraine   ? "~ q 6 months" (01/22/2017)  ? Morbid obesity ([place])   ? "I've lost 43# in 15 weeks w/weight watchers" (01/22/2017)  ? OSA on CPAP   ? Paroxysmal atrial fibrillation ([place]) 02/2014  ? chads2vasc score is 1  ? ? ?Current Outpatient Medications on File Prior to Visit  ?Medication Sig Dispense Refill  ? atorvastatin (LIPITOR) 20 MG tablet Take 1 tablet (20 mg total) by mouth every other day. 30 tablet 1  ? diclofenac Sodium (VOLTAREN) 1 % GEL Apply 2 g topically daily as needed (For pain). 50 g 1  ? dofetilide (TIKOSYN) 500 MCG capsule Take 1 capsule (500 mcg total) by mouth 2 (two) times daily. 180 capsule 1  ? ELIQUIS 5 MG TABS tablet Take 1 tablet by mouth twice daily 180 tablet 3  ?  fexofenadine (ALLEGRA ALLERGY) 60 MG tablet Take 1 tablet (60 mg total) by mouth daily. 90 tablet 1  ? fluticasone (FLONASE) 50 MCG/ACT nasal spray Place 2 sprays into both nostrils daily. 16 g 6  ? gabapentin (NEURONTIN) 300 MG capsule Take 300 mg by mouth. Not sure how many times a day he is currently taking.    ? losartan (COZAAR) 50 MG tablet Take 1 tablet by mouth once daily 90 tablet 3  ? MITIGARE 0.6 MG CAPS Take 1 capsule by mouth once daily 90 capsule 0  ? Multiple Vitamin (MULTIVITAMIN WITH MINERALS) TABS tablet Take 1 tablet by mouth at bedtime.    ? Omega-3 Fatty Acids (FISH OIL) 1000 MG CAPS Take 1,000 mg by mouth in the morning.    ? omeprazole (PRILOSEC) 40 MG capsule TAKE 1 CAPSULE BY MOUTH TWICE DAILY BEFORE A MEAL 180 capsule 3  ? TURMERIC PO Take 1 capsule by mouth every other day.     ? ?No current facility-administered medications on file prior to visit.  ? ? ?Past Surgical History:  ?Procedure Laterality Date  ? CARDIAC CATHETERIZATION  05/2005  ? "no blockage"  ? KNEE ARTHROSCOPY Right 02/2013  ? LEFT HEART CATH AND CORONARY ANGIOGRAPHY N/A 01/27/2017  ? Procedure: Left Heart Cath and Coronary Angiography;  Surgeon: Shelva Majestic  A, MD;  Location: [place] [facility];  Service: Cardiovascular;  Laterality: N/A;  ? ? ?Allergies  ?Allergen Reactions  ? Hydrochlorothiazide-Propranolol [Propranolol-Hctz] Other (See Comments)  ?  Joints lock up  ? Penicillins Other (See Comments)  ?  Hives ?Makes joints stiff and unable to move ?Has patient had a PCN reaction causing immediate rash, facial/tongue/throat swelling, SOB or lightheadedness with hypotension: No ?Has patient had a PCN reaction causing severe rash involving mucus membranes or skin necrosis: No ?Has patient had a PCN reaction that required hospitalization No ?Has patient had a PCN reaction occurring within the last 10 years: No ?If all of the above answers are "NO", then may proceed with Cephalosporin use.  ? Hydrochlorothiazide   ? ? ?BP  140/69   Ht '6\' 2"'$  (1.88 m)   Wt (!) 322 lb (146.1 kg)   BMI 41.34 kg/m?  ? ? ?  10/09/2020  ?  9:33 AM  ?[place] Adult Exercise  ?Frequency of aerobic exercise (# of days/week) 0  ?Average time in minutes 0  ?Frequency of strengthening activities (# of days/week) 0  ? ? ?   ? View : No data to display.  ?  ?  ?  ? ? ?    ?Objective:  ?Physical Exam: ? ?Gen: NAD, comfortable in exam room ? ?Left foot/ankle: ?Mod overpronation.  No other gross deformity, swelling, ecchymoses ?FROM with mild pain on internal rotation. ?TTP along post tib tendon course. ?Thompsons test negative. ?NV intact distally. ? ?Assessment & Plan:  ?1.  Left ankle pain: 2/2 posterior tibialis tendinopathy, dysfunction.  Initial improvement but worse past several days.  Will provide sports insoles with small scaphoid pads to wear regularly.  Avoid flat shoes, barefoot walking.  Icing, voltaren gel.  Restart home exercises in about 2 weeks - he would like to wait on formal physical therapy. ?

## 2022-01-05 NOTE — Progress Notes (Signed)
Cardiology Office Note:    Date:  01/07/2022   ID:  [name], DOB 10-24-1965, MRN [id number]  PCP:  [name], MD   [facility] Providers Cardiologist:  Mertie Moores, MD     Referring MD: [name], MD   Chief Complaint: follow-up fatigue  History of Present Illness:    [name] is a very pleasant 56 y.o. male with a hx of PAF on Tikosyn, morbid obesity, hyperlipidemia, and OSA on CPAP.   Normal coronary arteries by cardiac cath 2018. Coronary CTA 10/2020 revealed minimal non-obstructive (1-24%) calcified plaque in proximal vessel, no plaque in left main or LCx, coronary calcium score 0.384 59th percentile for age/sex/race matched controls.   He was last seen by Dr. Acie Fredrickson on 11/20/2020 at which time no changes were made to his current regimen and 1 year follow-up was recommended.  He has been followed by A-fib clinic and was last seen in the office on 11/22/2021.  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  Minimal coronary disease by CTA March 2022.  Lifestyle modification was discussed and encouraged.  He was advised to continue on dofetilide 500 mg twice daily, Toprol 12.5 mg daily, and Eliquis 5 mg twice daily.  He called our office on 11/26/2021 with concerns for fatigue and was advised to hold Toprol.  He was encouraged to restart if symptoms of A-fib worsened.  Today, he is here alone for follow-up. Reports he continues to have some fatigue. His job is very physically challenging and he has been working a lot of hours recently. Stays well hydrated. Only takes Toprol 12.5 when HR gets high. Notices it most frequently when he eats high sodium foods. Is working on [employer] and weight loss.  Was having some chest pain but it has improved since his omeprazole was increased. He denies chest pain, shortness of breath, lower extremity edema, palpitations, melena,  hematuria, hemoptysis, diaphoresis, weakness, presyncope, syncope, orthopnea, and PND.   Past Medical History:  Diagnosis Date   Allergy    Arthritis    "knees" (01/22/2017)   Cataract    Chronic lower back pain    GERD (gastroesophageal reflux disease)    Gout    "no flares in awhile" (01/22/2017)   History of MRI of cervical spine 09/25/2010   normal; left shoulder normal as well   Hypertension    Migraine    "~ q 6 months" (01/22/2017)   Morbid obesity ([place])    "I've lost 43# in 15 weeks w/weight watchers" (01/22/2017)   OSA on CPAP    Paroxysmal atrial fibrillation ([place]) 02/2014   chads2vasc score is 1    Past Surgical History:  Procedure Laterality Date   CARDIAC CATHETERIZATION  05/2005   "no blockage"   KNEE ARTHROSCOPY Right 02/2013   LEFT HEART CATH AND CORONARY ANGIOGRAPHY N/A 01/27/2017   Procedure: Left Heart Cath and Coronary Angiography;  Surgeon: Troy Sine, MD;  Location: [place] [facility];  Service: Cardiovascular;  Laterality: N/A;    Current Medications: Current Meds  Medication Sig   atorvastatin (LIPITOR) 20 MG tablet Take 1 tablet (20 mg total) by mouth every other day.   diclofenac Sodium (VOLTAREN) 1 % GEL Apply 2 g topically daily as needed (For pain).   ELIQUIS 5 MG TABS tablet Take 1 tablet by mouth twice daily   fexofenadine (ALLEGRA ALLERGY) 60  MG tablet Take 1 tablet (60 mg total) by mouth daily.   fluticasone (FLONASE) 50 MCG/ACT nasal spray Place 2 sprays into both nostrils daily.   gabapentin (NEURONTIN) 300 MG capsule Take 300 mg by mouth. Not sure how many times a day he is currently taking.   losartan (COZAAR) 50 MG tablet Take 1 tablet by mouth once daily   metoprolol succinate (TOPROL-XL) 25 MG 24 hr tablet Take 12.5 mg by mouth as needed.   MITIGARE 0.6 MG CAPS Take 1 capsule by mouth once daily   Multiple Vitamin (MULTIVITAMIN WITH MINERALS) TABS tablet Take 1 tablet by mouth at bedtime.   Omega-3 Fatty Acids (FISH OIL) 1000 MG  CAPS Take 1,000 mg by mouth in the morning.   omeprazole (PRILOSEC) 40 MG capsule TAKE 1 CAPSULE BY MOUTH TWICE DAILY BEFORE A MEAL   TURMERIC PO Take 1 capsule by mouth every other day.    [DISCONTINUED] dofetilide (TIKOSYN) 500 MCG capsule Take 1 capsule (500 mcg total) by mouth 2 (two) times daily.     Allergies:   Hydrochlorothiazide-propranolol [propranolol-hctz], Penicillins, and Hydrochlorothiazide   Social History   Socioeconomic History   Marital status: Married    Spouse name: Not on file   Number of children: 3   Years of education: Not on file   Highest education level: Not on file  Occupational History   Not on file  Tobacco Use   Smoking status: Former    Packs/day: 1.00    Years: 10.00    Pack years: 10.00    Types: Cigarettes    Quit date: 04/02/1994    Years since quitting: 27.7   Smokeless tobacco: Former    Types: Chew   Tobacco comments:    "chewed in my teens"  Vaping Use   Vaping Use: Never used  Substance and Sexual Activity   Alcohol use: No    Alcohol/week: 0.0 standard drinks    Comment: "stopped in 1994"   Drug use: Not Currently    Types: Marijuana    Comment: "stopped marijuna in 1994"   Sexual activity: Yes  Other Topics Concern   Not on file  Social History Narrative   Lives in [place] with spouse.  3 children, all grown.   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 There is no history of Colon cancer, Esophageal cancer, or Stomach cancer.  ROS:   Please see the history of present illness.   + fatigue All other systems  reviewed and are negative.  Labs/Other Studies Reviewed:    The following studies were reviewed today:  Coronary CTA 10/24/20  1. Coronary calcium score of 0.384. This was 59th percentile for age, sex, and race matched control.   2. Normal coronary origin. Left dominance.   3. CAD-RADS 1. Minimal non-obstructive CAD (1-24%). Consider non-atherosclerotic causes of chest pain. Consider preventive therapy and risk factor modification.    LHC 01/2017  The left ventricular ejection fraction is 55-65% by visual estimate. LV end diastolic pressure is normal. The left ventricular systolic function is normal.  Normal LV function without focal segmental wall motion abnormalities with an ejection fraction estimated at 60%.   Normal coronary arteries.   RECOMMENDATION: Medical therapy for the patient's PAF with resumption of anticoagulation therapy.  False positive nuclear perfusion study most likely due to the patient's body habitus and marked diaphragmatic attenuation.    Recent Labs: 02/18/2021: Hemoglobin 12.9; Platelets 213 08/27/2021: BUN 10; Creatinine, Ser 1.01; Magnesium 2.2; Potassium 4.2; Sodium 140  Recent Lipid Panel    Component Value Date/Time   CHOL 136 01/24/2020 1715   TRIG 201 (H) 01/24/2020 1715   HDL 30 (L) 01/24/2020 1715   CHOLHDL 4.5 01/24/2020 1715   CHOLHDL 7.2 01/22/2017 2320   VLDL 46 (H) 01/22/2017 2320   LDLCALC 72 01/24/2020 1715     Risk Assessment/Calculations:    CHA2DS2-VASc Score = 2  This indicates a 2.2% annual risk of stroke. The patient's score is based upon: CHF History: 0 HTN History: 1 Diabetes History: 0 Stroke History: 0 Vascular Disease History: 1 Age Score: 0 Gender Score: 0    Physical Exam:    VS:  BP 114/80   Pulse (!) 58   Ht '6\' 2"'$  (1.88 m)   Wt (!) 329 lb (149.2 kg)   BMI 42.24 kg/m     Wt Readings from Last 3 Encounters:  01/07/22 (!) 329 lb (149.2 kg)  12/10/21 (!) 322 lb (146.1 kg)  11/22/21 (!) 327 lb  9.6 oz (148.6 kg)     GEN:  Well developed, obese gentleman in no acute distress HEENT: Normal NECK: No JVD; No carotid bruits CARDIAC: RRR, no murmurs, rubs, gallops RESPIRATORY:  Clear to auscultation without rales, wheezing or rhonchi  ABDOMEN: Soft, non-tender, non-distended MUSCULOSKELETAL:  No edema; No deformity. 2+ pedal pulses, equal bilaterally SKIN: Warm and dry NEUROLOGIC:  Alert and oriented x 3 PSYCHIATRIC:  Normal affect   EKG:  EKG is ordered today.  The ekg ordered today demonstrates sinus bradycardia at 58 bpm, stable QTc at 420 ms, no ST/T wave abnormality  Diagnoses:    1. PAF (paroxysmal atrial fibrillation) ([place])   2. Secondary hypercoagulable state ([place])   3. Hyperlipidemia, unspecified hyperlipidemia type   4. High risk medication use   5. Obstructive sleep apnea   6. Body mass index 40.0-44.9, adult (HCC)    Assessment and Plan:     PAF on chronic anticoagulation: Maintaining SR at 58 bpm.  Rate is well controlled.  He denies symptoms of worsening atrial fibrillation.  Fatigue has improved since decreasing Toprol to 12.5 mg as needed.  He denies bleeding problems on Eliquis.  Continue Tikosyn, metoprolol as needed, Eliquis.  High risk medication monitoring: Consistent follow-up on Tikosyn.  EKG today reveals stable QTc.  We will provide Tikosyn refills and he will follow-up with A-fib clinic in 6 months  Hypertension: BP is well-controlled. Continue losartan.   OSA on CPAP: Reports compliance on CPAP. Is monitoring sleep pattern on Fit Bit and is sleeping only 4-5 hours some nights.  We discussed sleep hygiene and he thinks TV may be interfering with his sleep.  Hyperlipidemia LDL goal < 70: LDL 72 in June 2021. We will recheck today. Encouraged mostly plant based diet and weight loss. Continue atorvastatin.  Obesity: On Weight Watchers, got down to 309 then weight increased up to 329 lb today.  We discussed his challenges and I gave him some tips that  might help.  Encouraged him to continue to focus on weight loss, eating mostly plant-based  diet, and regular physical activity.     Disposition: 6 months with A Fib Clinic/1 year with  Dr. Acie Fredrickson  Medication Adjustments/Labs and Tests Ordered: Current medicines are reviewed at length with the patient today.  Concerns regarding medicines are outlined above.  Orders Placed This Encounter  Procedures   Magnesium   CBC   Comprehensive Metabolic Panel (CMET)   Lipid panel   EKG 12-Lead   Meds ordered this encounter  Medications   dofetilide (TIKOSYN) 500 MCG capsule    Sig: Take 1 capsule (500 mcg total) by mouth 2 (two) times daily.    Dispense:  180 capsule    Refill:  2    Patient Instructions  Medication Instructions:  Your physician recommends that you continue on your current medications as directed. Please refer to the Current Medication list given to you today. *If you need a refill on your cardiac medications before your next appointment, please call your pharmacy*   Lab Work: TODAY-CMET, CBC, LIPID, MAG If you have labs (blood work) drawn today and your tests are completely normal, you will receive your results only by: [place] (if you have MyChart) OR A paper copy in the mail If you have any lab test that is abnormal or we need to change your treatment, we will call you to review the results.   Testing/Procedures: NONE ORDERED   Follow-Up: At [facility], you and your health needs are our priority.  As part of our continuing mission to provide you with exceptional heart care, we have created designated Provider Care Teams.  These Care Teams include your primary Cardiologist (physician) and Advanced Practice Providers (APPs -  Physician Assistants and Nurse Practitioners) who all work together to provide you with the care you need, when you need it.  We recommend signing up for the patient portal called "MyChart".  Sign up information is provided on this  After Visit Summary.  MyChart is used to connect with patients for Virtual Visits (Telemedicine).  Patients are able to view lab/test results, encounter notes, upcoming appointments, etc.  Non-urgent messages can be sent to your provider as well.   To learn more about what you can do with MyChart, go to [email or web address].    Your next appointment:   6 month(s)  The format for your next appointment:   In Person  Provider:   You will follow [facility] in [facility]. Your provider will be: Roderic Palau, NP or Clint R. Marlene Lard, PA-C    Other Instructions   Important Information About Sugar         Signed, Emmaline Life, NP  01/07/2022 4:59 PM    [facility]

## 2022-01-07 ENCOUNTER — Encounter: Payer: Self-pay | Admitting: Nurse Practitioner

## 2022-01-07 ENCOUNTER — Ambulatory Visit: Payer: 59 | Admitting: Nurse Practitioner

## 2022-01-07 VITALS — BP 114/80 | HR 58 | Ht 74.0 in | Wt 329.0 lb

## 2022-01-07 DIAGNOSIS — E785 Hyperlipidemia, unspecified: Secondary | ICD-10-CM | POA: Diagnosis not present

## 2022-01-07 DIAGNOSIS — G4733 Obstructive sleep apnea (adult) (pediatric): Secondary | ICD-10-CM

## 2022-01-07 DIAGNOSIS — D6869 Other thrombophilia: Secondary | ICD-10-CM

## 2022-01-07 DIAGNOSIS — I480 Paroxysmal atrial fibrillation: Secondary | ICD-10-CM

## 2022-01-07 DIAGNOSIS — Z79899 Other long term (current) drug therapy: Secondary | ICD-10-CM

## 2022-01-07 DIAGNOSIS — Z6841 Body Mass Index (BMI) 40.0 and over, adult: Secondary | ICD-10-CM

## 2022-01-07 MED ORDER — DOFETILIDE 500 MCG PO CAPS: 500.0000 ug | ORAL_CAPSULE | Freq: Two times a day (BID) | ORAL | 2 refills | Status: DC

## 2022-01-07 NOTE — Patient Instructions (Signed)
Medication Instructions:  Your physician recommends that you continue on your current medications as directed. Please refer to the Current Medication list given to you today. *If you need a refill on your cardiac medications before your next appointment, please call your pharmacy*   Lab Work: TODAY-CMET, CBC, LIPID, MAG If you have labs (blood work) drawn today and your tests are completely normal, you will receive your results only by: [place] (if you have MyChart) OR A paper copy in the mail If you have any lab test that is abnormal or we need to change your treatment, we will call you to review the results.   Testing/Procedures: NONE ORDERED   Follow-Up: At [facility], you and your health needs are our priority.  As part of our continuing mission to provide you with exceptional heart care, we have created designated Provider Care Teams.  These Care Teams include your primary Cardiologist (physician) and Advanced Practice Providers (APPs -  Physician Assistants and Nurse Practitioners) who all work together to provide you with the care you need, when you need it.  We recommend signing up for the patient portal called "MyChart".  Sign up information is provided on this After Visit Summary.  MyChart is used to connect with patients for Virtual Visits (Telemedicine).  Patients are able to view lab/test results, encounter notes, upcoming appointments, etc.  Non-urgent messages can be sent to your provider as well.   To learn more about what you can do with MyChart, go to [email or web address].    Your next appointment:   6 month(s)  The format for your next appointment:   In Person  Provider:   You will follow up in the [facility] located at [facility]. Your provider will be: Roderic Palau, NP or Clint R. Fenton, PA-C    Other Instructions   Important Information About Sugar

## 2022-01-08 ENCOUNTER — Encounter: Payer: Self-pay | Admitting: *Deleted

## 2022-01-08 LAB — LIPID PANEL
Chol/HDL Ratio: 4.2 ratio (ref 0.0–5.0)
Cholesterol, Total: 129 mg/dL (ref 100–199)
HDL: 31 mg/dL — ABNORMAL LOW (ref >39)
LDL Chol Calc (NIH): 66 mg/dL (ref 0–99)
Triglycerides: 191 mg/dL — ABNORMAL HIGH (ref 0–149)
VLDL Cholesterol Cal: 32 mg/dL (ref 5–40)

## 2022-01-08 LAB — CBC
Hematocrit: 39.7 % (ref 37.5–51.0)
Hemoglobin: 13.1 g/dL (ref 13.0–17.7)
MCH: 27.3 pg (ref 26.6–33.0)
MCHC: 33 g/dL (ref 31.5–35.7)
MCV: 83 fL (ref 79–97)
Platelets: 230 10*3/uL (ref 150–450)
RBC: 4.8 x10E6/uL (ref 4.14–5.80)
RDW: 13.9 % (ref 11.6–15.4)
WBC: 8 10*3/uL (ref 3.4–10.8)

## 2022-01-08 LAB — COMPREHENSIVE METABOLIC PANEL
ALT: 27 IU/L (ref 0–44)
AST: 29 IU/L (ref 0–40)
Albumin/Globulin Ratio: 1.7 (ref 1.2–2.2)
Albumin: 4.5 g/dL (ref 3.8–4.9)
Alkaline Phosphatase: 92 IU/L (ref 44–121)
BUN/Creatinine Ratio: 9 (ref 9–20)
BUN: 9 mg/dL (ref 6–24)
Bilirubin Total: 0.4 mg/dL (ref 0.0–1.2)
CO2: 24 mmol/L (ref 20–29)
Calcium: 9.2 mg/dL (ref 8.7–10.2)
Chloride: 105 mmol/L (ref 96–106)
Creatinine, Ser: 0.96 mg/dL (ref 0.76–1.27)
Globulin, Total: 2.6 g/dL (ref 1.5–4.5)
Glucose: 94 mg/dL (ref 70–99)
Potassium: 4.1 mmol/L (ref 3.5–5.2)
Sodium: 141 mmol/L (ref 134–144)
Total Protein: 7.1 g/dL (ref 6.0–8.5)
eGFR: 92 mL/min/{1.73_m2} (ref >59)

## 2022-01-08 LAB — MAGNESIUM: Magnesium: 2.1 mg/dL (ref 1.6–2.3)

## 2022-01-21 ENCOUNTER — Ambulatory Visit (INDEPENDENT_AMBULATORY_CARE_PROVIDER_SITE_OTHER): Payer: 59 | Admitting: Family Medicine

## 2022-01-21 ENCOUNTER — Encounter: Payer: Self-pay | Admitting: Family Medicine

## 2022-01-21 VITALS — BP 128/88 | Ht 74.0 in | Wt 321.0 lb

## 2022-01-21 DIAGNOSIS — M76822 Posterior tibial tendinitis, left leg: Secondary | ICD-10-CM | POA: Diagnosis not present

## 2022-01-21 NOTE — Patient Instructions (Signed)
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 Start physical therapy and do home exercises on days you don't go to therapy. Consider superfeet inserts or our custom orthotics. Avoid icing on the inside of this ankle now that you have some numbness into your big toe. Follow up with me in 5-6 weeks.

## 2022-01-21 NOTE — Progress Notes (Signed)
PCP: [name], MD  Subjective:   HPI: Patient is a 56 y.o. male here for left ankle pain.  4/4: Patient reports for several months he has had medial left foot and ankle pain. Denies any injury or trauma. Pain is worse with walking and standing for prolonged periods. Some associated swelling but no bruising. He also just this morning started to get pain on the left side of his neck and shoulder. No acute injury or trauma with this. No radiation down his left upper extremity. No swelling or bruising, numbness or tingling.  5/8: Patient reports his left shoulder pain is much better. His left ankle had been improving but worsened again past few several days. He has been taking tylenol as needed. Wearing brooks shoes though at work has steel toe boots. Voltaren gel not working much. His exercises were a little too painful to do regularly. No new injuries.  6/19: Patient reports his left ankle continues to bother him medially. No new injuries. Getting numbness into left distal medial foot and great toe. Unable to wear sports insoles in regular shoes as they dig into his foot. Wearing these in his steel toe boots though. Is walking barefoot at times.  Past Medical History:  Diagnosis Date   Allergy    Arthritis    "knees" (01/22/2017)   Cataract    Chronic lower back pain    GERD (gastroesophageal reflux disease)    Gout    "no flares in awhile" (01/22/2017)   History of MRI of cervical spine 09/25/2010   normal; left shoulder normal as well   Hypertension    Migraine    "~ q 6 months" (01/22/2017)   Morbid obesity ([place])    "I've lost 43# in 15 weeks w/weight watchers" (01/22/2017)   OSA on CPAP    Paroxysmal atrial fibrillation ([place]) 02/2014   chads2vasc score is 1    Current Outpatient Medications on File Prior to Visit  Medication Sig Dispense Refill   atorvastatin (LIPITOR) 20 MG tablet Take 1 tablet (20 mg total) by mouth every other day. 30 tablet 1    diclofenac Sodium (VOLTAREN) 1 % GEL Apply 2 g topically daily as needed (For pain). 50 g 1   dofetilide (TIKOSYN) 500 MCG capsule Take 1 capsule (500 mcg total) by mouth 2 (two) times daily. 180 capsule 2   ELIQUIS 5 MG TABS tablet Take 1 tablet by mouth twice daily 180 tablet 3   fexofenadine (ALLEGRA ALLERGY) 60 MG tablet Take 1 tablet (60 mg total) by mouth daily. 90 tablet 1   fluticasone (FLONASE) 50 MCG/ACT nasal spray Place 2 sprays into both nostrils daily. 16 g 6   gabapentin (NEURONTIN) 300 MG capsule Take 300 mg by mouth. Not sure how many times a day he is currently taking.     losartan (COZAAR) 50 MG tablet Take 1 tablet by mouth once daily 90 tablet 3   metoprolol succinate (TOPROL-XL) 25 MG 24 hr tablet Take 12.5 mg by mouth as needed.     MITIGARE 0.6 MG CAPS Take 1 capsule by mouth once daily 90 capsule 0   Multiple Vitamin (MULTIVITAMIN WITH MINERALS) TABS tablet Take 1 tablet by mouth at bedtime.     Omega-3 Fatty Acids (FISH OIL) 1000 MG CAPS Take 1,000 mg by mouth in the morning.     omeprazole (PRILOSEC) 40 MG capsule TAKE 1 CAPSULE BY MOUTH TWICE DAILY BEFORE A MEAL 180 capsule 3   TURMERIC PO Take 1  capsule by mouth every other day.      No current facility-administered medications on file prior to visit.    Past Surgical History:  Procedure Laterality Date   CARDIAC CATHETERIZATION  05/2005   "no blockage"   KNEE ARTHROSCOPY Right 02/2013   LEFT HEART CATH AND CORONARY ANGIOGRAPHY N/A 01/27/2017   Procedure: Left Heart Cath and Coronary Angiography;  Surgeon: Troy Sine, MD;  Location: [place] [facility];  Service: Cardiovascular;  Laterality: N/A;    Allergies  Allergen Reactions   Hydrochlorothiazide-Propranolol [Propranolol-Hctz] Other (See Comments)    Joints lock up   Penicillins Other (See Comments)    Hives Makes joints stiff and unable to move Has patient had a PCN reaction causing immediate rash, facial/tongue/throat swelling, SOB or  lightheadedness with hypotension: No Has patient had a PCN reaction causing severe rash involving mucus membranes or skin necrosis: No Has patient had a PCN reaction that required hospitalization No Has patient had a PCN reaction occurring within the last 10 years: No If all of the above answers are "NO", then may proceed with Cephalosporin use.   Hydrochlorothiazide     BP 128/88 (BP Location: Right Arm, Patient Position: Sitting, Cuff Size: Normal)   Ht '6\' 2"'$  (1.88 m)   Wt (!) 321 lb (145.6 kg)   BMI 41.21 kg/m      10/09/2020    9:33 AM  [facility]  Frequency of aerobic exercise (# of days/week) 0  Average time in minutes 0  Frequency of strengthening activities (# of days/week) 0        No data to display              Objective:  Physical Exam:  Gen: NAD, comfortable in exam room  Left foot/ankle: Mod overpronation. No other gross deformity, swelling, ecchymoses FROM TTP post tib tendon course Negative ant drawer and negative talar tilt.   Sensation diminished in great toe.   Negative tinels tarsal tunnel.  Assessment & Plan:  1.  Left ankle pain: 2/2 post tib tendinopathy/dysfunction and tibial tunnel syndrome.  Not improving with sports insoles/scaphoid pads but unable to tolerate in regular shoes.  Encouraged either superfeet insoles for these or custom orthotics.  Start formal physical therapy as well.  F/u in 5-6 weeks.

## 2022-02-06 ENCOUNTER — Ambulatory Visit: Payer: 59

## 2022-02-12 NOTE — Therapy (Signed)
OUTPATIENT PHYSICAL THERAPY LOWER EXTREMITY EVALUATION   Patient Name: [name] MRN: [id number] DOB:January 25, 1966, 56 y.o., male Today's Date: 02/13/2022   PT End of Session - 02/13/22 1740     Visit Number 1    Number of Visits 17    Date for PT Re-Evaluation 04/10/22    Authorization Type UHC    PT Start Time 1700    PT Stop Time 1735    PT Time Calculation (min) 35 min    Activity Tolerance Patient tolerated treatment well    Behavior During Therapy WFL for tasks assessed/performed             Past Medical History:  Diagnosis Date   Allergy    Arthritis    "knees" (01/22/2017)   Cataract    Chronic lower back pain    GERD (gastroesophageal reflux disease)    Gout    "no flares in awhile" (01/22/2017)   History of MRI of cervical spine 09/25/2010   normal; left shoulder normal as well   Hypertension    Migraine    "~ q 6 months" (01/22/2017)   Morbid obesity ([place])    "I've lost 43# in 15 weeks w/weight watchers" (01/22/2017)   OSA on CPAP    Paroxysmal atrial fibrillation ([place]) 02/2014   chads2vasc score is 1   Past Surgical History:  Procedure Laterality Date   CARDIAC CATHETERIZATION  05/2005   "no blockage"   KNEE ARTHROSCOPY Right 02/2013   LEFT HEART CATH AND CORONARY ANGIOGRAPHY N/A 01/27/2017   Procedure: Left Heart Cath and Coronary Angiography;  Surgeon: Troy Sine, MD;  Location: [facility];  Service: Cardiovascular;  Laterality: N/A;   Patient Active Problem List   Diagnosis Date Noted   Acute left ankle pain 10/25/2021   Overdose 02/18/2021   Chronic left-sided low back pain without sciatica 09/05/2020   Dyshydrosis 08/19/2020   Tendonitis of wrist, left 08/19/2020   Body mass index 40.0-44.9, adult ([place]) 02/10/2019   Urinary incontinence 01/02/2018   Left groin pain 01/02/2018   Allergic rhinitis 01/02/2018   Healthcare maintenance 09/12/2017   Acute nonintractable headache 02/03/2017   Abnormal stress test    Symptomatic sinus  bradycardia    Left sided numbness    Muscle fasciculation    Primary osteoarthritis of right knee 07/23/2016   Plantar fasciitis of right foot 05/07/2016   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/05/2016   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   Dizziness 03/21/2015   Shortness of breath 03/21/2015   OSA on CPAP    PAF (paroxysmal atrial fibrillation) (HCC)    Palpitations    Atypical chest pain 03/03/2015   Left knee pain 10/13/2014   Atrial fibrillation ([place]) 03/14/2014   Right knee pain 11/10/2012   Sinusitis 10/23/2012   Left hip pain 06/17/2011   OSA (obstructive sleep apnea) 05/17/2011   Chronic back pain 10/29/2010   INSOMNIA 09/19/2008   Gout 05/13/2007   Hyperlipidemia 10/02/2006   Morbid obesity ([place]) 10/02/2006   Migraine 10/02/2006   HYPERTENSION, BENIGN SYSTEMIC 10/02/2006   GASTROESOPHAGEAL REFLUX, NO ESOPHAGITIS 10/02/2006    PCP: [name], MD   REFERRING PROVIDER: Dene Gentry, MD  REFERRING DIAG: [phone] (ICD-10-CM) - Posterior tibial tendinitis of left lower extremity  THERAPY DIAG:  Pain in left ankle and joints of left foot - Plan: PT plan of care cert/re-cert  Muscle weakness (generalized) - Plan: PT plan of care cert/re-cert  Other  abnormalities of gait and mobility - Plan: PT plan of care cert/re-cert  Rationale for Evaluation and Treatment Rehabilitation  ONSET DATE: Chronic  SUBJECTIVE:   SUBJECTIVE STATEMENT: Pt presents to PT with reports of acute on chronic L foot/ankle pain and numbness. Denies MOI, pain has gradually been increasing. Has previous R knee injury so he primarily uses L when ascending stairs. 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   PERTINENT HISTORY: HTN; AFib  PAIN:  Are you having pain?  Yes: NPRS scale: 6/10 (6/10 at worst) Pain location: R heel and R medial foot Pain description: sharp  and numb Aggravating factors: work, prolonged sitting Relieving factors: None  PRECAUTIONS: None  WEIGHT BEARING RESTRICTIONS No  FALLS:  Has patient fallen in last 6 months? No  LIVING ENVIRONMENT: Lives with: lives with their family Lives in: Mobile home Stairs: Yes: External: 5 steps; on right going up Has following equipment at home: None  OCCUPATION: forklift driver for [employer]   PLOF: Independent and Independent with basic ADLs  PATIENT GOALS: decrease pain and numbness in L foot; increase walking tolerance   OBJECTIVE:   DIAGNOSTIC FINDINGS:   See imaging   PATIENT SURVEYS:  FOTO: 57% function; 68% predicted  COGNITION:  Overall cognitive status: Within functional limits for tasks assessed     SENSATION: Light touch: Impaired - medial L ankle decrease  POSTURE:   Large body habitus; increased lordosis  PALPATION: TTP to medial L ankle, L heel  LOWER EXTREMITY ROM:     AROM Right 02/13/2022   Left 02/13/2022   Hip flexion     Hip extension    Hip abduction    Hip adduction    Hip internal rotation    Hip external rotation    Knee flexion    Knee extension    Ankle dorsiflexion  -8  Ankle plantarflexion    Ankle inversion    Ankle eversion       (Blank rows = not tested)   LOWER EXTREMITY MMT:    MMT Right 02/13/2022  Left 02/13/2022   Hip flexion     Hip extension    Hip abduction    Hip adduction    Hip external rotation    Hip internal rotation    Knee extension    Knee flexion    Ankle dorsiflexion  5/5 3+/5  Ankle plantarflexion 5/5 3+/5  Ankle inversion 5/5 3+/5  Ankle eversion 5/5 3+/5  Grossly    (Blank rows = not tested)  LOWER EXTREMITY SPECIAL TESTS:  Ankle special tests: Tinel's test-Posterior tibialis: positive   FUNCTIONAL TESTS:  30 Second Sit to Stand: 9 reps SLS: 5 seconds L  GAIT: Distance walked: 43f Assistive device utilized: None Level of assistance: Complete Independence Comments: slight  antalgic gait L  TODAY'S TREATMENT: [facility] Adult PT Treatment:                                                DATE: 02/13/2022 Therapeutic Exercise: Calf stretch with towel x 30" L Ankle 4-way RTB x 5 each Seated heel raise with ball btwn heels x 10   PATIENT EDUCATION:  Education details: eval findings, FOTO, HEP, POC Person educated: Patient Education method: Explanation, Demonstration, and Handouts Education comprehension: verbalized understanding and returned demonstration  HOME EXERCISE PROGRAM: Access Code: [email or web address] URL: [email or web address] Date: 02/13/2022 Prepared by: Octavio Manns  Exercises - Long Sitting Calf Stretch with Strap  - 2 x daily - 7 x weekly - 2-3 reps - 30 sec hold - Ankle Dorsiflexion with Resistance  - 1 x daily - 7 x weekly - 3 sets - 10 reps - red theraband hold - Ankle Inversion with Resistance  - 1 x daily - 7 x weekly - 3 sets - 10 reps - red theraband hold - Ankle Eversion with Resistance  - 1 x daily - 7 x weekly - 3 sets - 10 reps - red theraband hold - Ankle Plantar Flexion with Resistance  - 1 x daily - 7 x weekly - 3 sets - 10 reps - red theraband hold - Seated Calf Raise With Small Ball at Heels  - 1 x daily - 7 x weekly - 3 sets - 10 reps  ASSESSMENT:  CLINICAL IMPRESSION: Patient is a 56 y.o. M who was seen today for physical therapy evaluation and treatment for L foot/ankle pain. Physical findings are consistent with physcian impression, as pt demonstrates decrease in L ankle strength/ROM as well as positive provocation for tinel's in posterior tib distribution. His FOTO score shows decreased functional ability indicating he is operating below PLOF. Pt would benefit from skilled PT services working on improving L ankle strength and stability in order to decrease pain and improve function with work and community activity.   OBJECTIVE IMPAIRMENTS decreased activity tolerance, decreased balance, decreased mobility, difficulty  walking, decreased ROM, decreased strength, obesity, and pain   ACTIVITY LIMITATIONS sitting, standing, squatting, stairs, and transfers  PARTICIPATION LIMITATIONS: community activity, occupation, and yard work  PERSONAL FACTORS Fitness and 1-2 comorbidities: HTN; AFib  are also affecting patient's functional outcome.   REHAB POTENTIAL: Excellent  CLINICAL DECISION MAKING: Stable/uncomplicated  EVALUATION COMPLEXITY: Low   GOALS: Goals reviewed with patient? No  SHORT TERM GOALS: Target date: 03/06/2022  Pt will be compliant and knowledgeable with initial HEP for improved comfort and carryover Baseline: initial HEP given  Goal status: INITIAL  2.  Pt will self report L foot/ankle pain no greater than 4/10 for improved comfort and functional ability Baseline: 6/10 at worst Goal status: INITIAL   LONG TERM GOALS: Target date: 04/10/2022   Pt will self report L foot/ankle pain no greater than 1-2/10 for improved comfort and functional ability Baseline: 6/10 at worst Goal status: INITIAL  2.  Pt will improve FOTO function score to no less than 68% as proxy for functional improvement Baseline: 57% function Goal status: INITIAL  3.  Pt will increase 30 Second Sit to Stand rep count to no less than 11 reps for improved balance, strength, and functional mobility Baseline: 9 reps  Goal status: INITIAL   4.  Pt will be able to hold L SLS for at least 30 seconds for improved balance and functional mobility Baseline: 5 seconds Goal status: INITIAL  5.  Pt will improve L ankle DF to no less than 5 deg for improved comfort and functional mobility with work and community activity Baseline: -8 deg Goal status: INITIAL  PLAN: PT FREQUENCY: 1-2x/week  PT DURATION: 8 weeks  PLANNED INTERVENTIONS: Therapeutic exercises, Therapeutic activity, Neuromuscular re-education, Balance training, Gait training, Patient/Family education, Joint mobilization, Aquatic Therapy, Dry Needling,  Electrical stimulation, Cryotherapy, Moist heat, Manual therapy, and Re-evaluation  PLAN FOR NEXT SESSION: assess HEP response, progress ankle strength/balance   Ward Chatters, PT 02/13/2022, 5:42  PM  

## 2022-02-13 ENCOUNTER — Ambulatory Visit: Payer: 59 | Attending: Family Medicine

## 2022-02-13 DIAGNOSIS — R2689 Other abnormalities of gait and mobility: Secondary | ICD-10-CM | POA: Diagnosis present

## 2022-02-13 DIAGNOSIS — M6281 Muscle weakness (generalized): Secondary | ICD-10-CM | POA: Insufficient documentation

## 2022-02-13 DIAGNOSIS — M25572 Pain in left ankle and joints of left foot: Secondary | ICD-10-CM | POA: Diagnosis not present

## 2022-02-21 ENCOUNTER — Ambulatory Visit: Payer: 59

## 2022-02-21 DIAGNOSIS — M6281 Muscle weakness (generalized): Secondary | ICD-10-CM

## 2022-02-21 DIAGNOSIS — M25572 Pain in left ankle and joints of left foot: Secondary | ICD-10-CM

## 2022-02-21 DIAGNOSIS — R2689 Other abnormalities of gait and mobility: Secondary | ICD-10-CM

## 2022-02-21 NOTE — Therapy (Signed)
OUTPATIENT PHYSICAL THERAPY TREATMENT NOTE   Patient Name: [name] MRN: [id number] DOB:05/10/1966, 56 y.o., male 1 Date: 02/21/2022  PCP: [name], MD  REFERRING PROVIDER: Dene Gentry, MD  END OF SESSION:   PT End of Session - 02/21/22 1618     Visit Number 2    Number of Visits 17    Date for PT Re-Evaluation 04/10/22    Authorization Type UHC    PT Start Time 9983    PT Stop Time 1656    PT Time Calculation (min) 39 min    Activity Tolerance Patient tolerated treatment well    Behavior During Therapy WFL for tasks assessed/performed             Past Medical History:  Diagnosis Date   Allergy    Arthritis    "knees" (01/22/2017)   Cataract    Chronic lower back pain    GERD (gastroesophageal reflux disease)    Gout    "no flares in awhile" (01/22/2017)   History of MRI of cervical spine 09/25/2010   normal; left shoulder normal as well   Hypertension    Migraine    "~ q 6 months" (01/22/2017)   Morbid obesity ([place])    "I've lost 43# in 15 weeks w/weight watchers" (01/22/2017)   OSA on CPAP    Paroxysmal atrial fibrillation ([place]) 02/2014   chads2vasc score is 1   Past Surgical History:  Procedure Laterality Date   CARDIAC CATHETERIZATION  05/2005   "no blockage"   KNEE ARTHROSCOPY Right 02/2013   LEFT HEART CATH AND CORONARY ANGIOGRAPHY N/A 01/27/2017   Procedure: Left Heart Cath and Coronary Angiography;  Surgeon: Troy Sine, MD;  Location: [facility];  Service: Cardiovascular;  Laterality: N/A;   Patient Active Problem List   Diagnosis Date Noted   Acute left ankle pain 10/25/2021   Overdose 02/18/2021   Chronic left-sided low back pain without sciatica 09/05/2020   Dyshydrosis 08/19/2020   Tendonitis of wrist, left 08/19/2020   Body mass index 40.0-44.9, adult ([place]) 02/10/2019   Urinary incontinence 01/02/2018   Left groin pain 01/02/2018   Allergic rhinitis 01/02/2018   Healthcare maintenance 09/12/2017   Acute  nonintractable headache 02/03/2017   Abnormal stress test    Symptomatic sinus bradycardia    Left sided numbness    Muscle fasciculation    Primary osteoarthritis of right knee 07/23/2016   Plantar fasciitis of right foot 05/07/2016   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/05/2016   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   Dizziness 03/21/2015   Shortness of breath 03/21/2015   OSA on CPAP    PAF (paroxysmal atrial fibrillation) (HCC)    Palpitations    Atypical chest pain 03/03/2015   Left knee pain 10/13/2014   Atrial fibrillation ([place]) 03/14/2014   Right knee pain 11/10/2012   Sinusitis 10/23/2012   Left hip pain 06/17/2011   OSA (obstructive sleep apnea) 05/17/2011   Chronic back pain 10/29/2010   INSOMNIA 09/19/2008   Gout 05/13/2007   Hyperlipidemia 10/02/2006   Morbid obesity ([place]) 10/02/2006   Migraine 10/02/2006   HYPERTENSION, BENIGN SYSTEMIC 10/02/2006   GASTROESOPHAGEAL REFLUX, NO ESOPHAGITIS 10/02/2006    REFERRING DIAG: J82.505 (ICD-10-CM) - Posterior tibial tendinitis of left lower extremity  THERAPY DIAG:  Pain in left ankle and joints of left foot  Muscle weakness (generalized)  Other abnormalities of gait and mobility  Rationale for Evaluation and Treatment Rehabilitation  PERTINENT HISTORY: HTN; AFib  PRECAUTIONS: None  SUBJECTIVE: Patient states occasional HEP compliance. He reports that he has some pain at work and tries not to walk too much.  PAIN:  Are you having pain?  Yes: NPRS scale: 6/10 (10/10 at worst) Pain location: R heel and R medial foot Pain description: sharp and numb Aggravating factors: work, prolonged sitting Relieving factors: None   OBJECTIVE: (objective measures completed at initial evaluation unless otherwise dated)   DIAGNOSTIC FINDINGS:            See imaging    PATIENT SURVEYS:  FOTO: 57% function; 68% predicted   COGNITION:           Overall cognitive status: Within  functional limits for tasks assessed                          SENSATION: Light touch: Impaired - medial L ankle decrease   POSTURE:            Large body habitus; increased lordosis   PALPATION: TTP to medial L ankle, L heel   LOWER EXTREMITY ROM:      AROM Right 02/13/2022   Left 02/13/2022   Hip flexion       Hip extension      Hip abduction      Hip adduction      Hip internal rotation      Hip external rotation      Knee flexion      Knee extension      Ankle dorsiflexion   -8  Ankle plantarflexion      Ankle inversion      Ankle eversion                            (Blank rows = not tested)     LOWER EXTREMITY MMT:     MMT Right 02/13/2022  Left 02/13/2022   Hip flexion       Hip extension      Hip abduction      Hip adduction      Hip external rotation      Hip internal rotation      Knee extension      Knee flexion      Ankle dorsiflexion  5/5 3+/5  Ankle plantarflexion 5/5 3+/5  Ankle inversion 5/5 3+/5  Ankle eversion 5/5 3+/5  Grossly      (Blank rows = not tested)   LOWER EXTREMITY SPECIAL TESTS:  Ankle special tests: Tinel's test-Posterior tibialis: positive    FUNCTIONAL TESTS:  30 Second Sit to Stand: 9 reps SLS: 5 seconds L   GAIT: Distance walked: 89f Assistive device utilized: None Level of assistance: Complete Independence Comments: slight antalgic gait L   TODAY'S TREATMENT: [facility] Adult PT Treatment:                                                DATE: 02/21/2022 Therapeutic Exercise: Ankle 4-way RTB 2 x 10 each Seated heel raise with ball btwn heels 3 x 10  Slant board calf stretch 2x1' Standing heel raises 2x10 Standing toe raises 3x10 Towel slides inv 4# weight x2 Towel slides eversion no weight x2 (pain) Neuromuscular re-ed: Romberg stance x30" EC Tandem stance x30" BIL SLS x11"  Lt Romberg stance on foam x30" EC Tandem stance on airex x30" BIL  [facility] Adult PT Treatment:                                                 DATE: 02/13/2022 Therapeutic Exercise: Calf stretch with towel x 30" L Ankle 4-way RTB x 5 each Seated heel raise with ball btwn heels x 10    PATIENT EDUCATION:  Education details: eval findings, FOTO, HEP, POC Person educated: Patient Education method: Explanation, Demonstration, and Handouts Education comprehension: verbalized understanding and returned demonstration   HOME EXERCISE PROGRAM: Access Code: [email or web address] URL: [email or web address] Date: 02/13/2022 Prepared by: Octavio Manns   Exercises - Long Sitting Calf Stretch with Strap  - 2 x daily - 7 x weekly - 2-3 reps - 30 sec hold - Ankle Dorsiflexion with Resistance  - 1 x daily - 7 x weekly - 3 sets - 10 reps - red theraband hold - Ankle Inversion with Resistance  - 1 x daily - 7 x weekly - 3 sets - 10 reps - red theraband hold - Ankle Eversion with Resistance  - 1 x daily - 7 x weekly - 3 sets - 10 reps - red theraband hold - Ankle Plantar Flexion with Resistance  - 1 x daily - 7 x weekly - 3 sets - 10 reps - red theraband hold - Seated Calf Raise With Small Ball at Heels  - 1 x daily - 7 x weekly - 3 sets - 10 reps   ASSESSMENT:   CLINICAL IMPRESSION: Patient presents to PT with continued reports of Lt ankle pain and occasional HEP compliance due to working two jobs. Session today focused on L ankle and LE strengthening as well as balance tasks to improve proprioception. Patient was able to tolerate all prescribed exercises with no adverse effects. Patient continues to benefit from skilled PT services and should be progressed as able to improve functional independence.    OBJECTIVE IMPAIRMENTS decreased activity tolerance, decreased balance, decreased mobility, difficulty walking, decreased ROM, decreased strength, obesity, and pain    ACTIVITY LIMITATIONS sitting, standing, squatting, stairs, and transfers   PARTICIPATION LIMITATIONS: community activity, occupation, and yard work   PERSONAL FACTORS Fitness  and 1-2 comorbidities: HTN; AFib  are also affecting patient's functional outcome.    REHAB POTENTIAL: Excellent   CLINICAL DECISION MAKING: Stable/uncomplicated   EVALUATION COMPLEXITY: Low     GOALS: Goals reviewed with patient? No   SHORT TERM GOALS: Target date: 03/06/2022  Pt will be compliant and knowledgeable with initial HEP for improved comfort and carryover Baseline: initial HEP given  Goal status: INITIAL   2.  Pt will self report L foot/ankle pain no greater than 4/10 for improved comfort and functional ability Baseline: 6/10 at worst Goal status: INITIAL    LONG TERM GOALS: Target date: 04/10/2022    Pt will self report L foot/ankle pain no greater than 1-2/10 for improved comfort and functional ability Baseline: 6/10 at worst Goal status: INITIAL   2.  Pt will improve FOTO function score to no less than 68% as proxy for functional improvement Baseline: 57% function Goal status: INITIAL   3.  Pt will increase 30 Second Sit to Stand rep count to no less than 11 reps for improved balance, strength, and functional mobility Baseline: 9 reps  Goal status: INITIAL  4.  Pt will be able to hold L SLS for at least 30 seconds for improved balance and functional mobility Baseline: 5 seconds Goal status: INITIAL   5.  Pt will improve L ankle DF to no less than 5 deg for improved comfort and functional mobility with work and community activity Baseline: -8 deg Goal status: INITIAL   PLAN: PT FREQUENCY: 1-2x/week   PT DURATION: 8 weeks   PLANNED INTERVENTIONS: Therapeutic exercises, Therapeutic activity, Neuromuscular re-education, Balance training, Gait training, Patient/Family education, Joint mobilization, Aquatic Therapy, Dry Needling, Electrical stimulation, Cryotherapy, Moist heat, Manual therapy, and Re-evaluation   PLAN FOR NEXT SESSION: assess HEP response, progress ankle strength/balance    Evelene Croon, PTA 02/21/2022, 4:58 PM

## 2022-02-25 ENCOUNTER — Ambulatory Visit: Payer: 59 | Admitting: Family Medicine

## 2022-02-26 NOTE — Therapy (Unsigned)
OUTPATIENT PHYSICAL THERAPY TREATMENT NOTE   Patient Name: [name] MRN: [id number] DOB:1965/08/26, 56 y.o., male 70 Date: 02/27/2022  PCP: [name], MD  REFERRING PROVIDER: Dene Gentry, MD  END OF SESSION:   PT End of Session - 02/27/22 1750     Visit Number 3    Number of Visits 17    Date for PT Re-Evaluation 04/10/22    Authorization Type UHC    PT Start Time 1660    PT Stop Time 1655    PT Time Calculation (min) 40 min    Activity Tolerance Patient tolerated treatment well    Behavior During Therapy WFL for tasks assessed/performed              Past Medical History:  Diagnosis Date   Allergy    Arthritis    "knees" (01/22/2017)   Cataract    Chronic lower back pain    GERD (gastroesophageal reflux disease)    Gout    "no flares in awhile" (01/22/2017)   History of MRI of cervical spine 09/25/2010   normal; left shoulder normal as well   Hypertension    Migraine    "~ q 6 months" (01/22/2017)   Morbid obesity ([place])    "I've lost 43# in 15 weeks w/weight watchers" (01/22/2017)   OSA on CPAP    Paroxysmal atrial fibrillation ([place]) 02/2014   chads2vasc score is 1   Past Surgical History:  Procedure Laterality Date   CARDIAC CATHETERIZATION  05/2005   "no blockage"   KNEE ARTHROSCOPY Right 02/2013   LEFT HEART CATH AND CORONARY ANGIOGRAPHY N/A 01/27/2017   Procedure: Left Heart Cath and Coronary Angiography;  Surgeon: Troy Sine, MD;  Location: [facility];  Service: Cardiovascular;  Laterality: N/A;   Patient Active Problem List   Diagnosis Date Noted   Acute left ankle pain 10/25/2021   Overdose 02/18/2021   Chronic left-sided low back pain without sciatica 09/05/2020   Dyshydrosis 08/19/2020   Tendonitis of wrist, left 08/19/2020   Body mass index 40.0-44.9, adult ([place]) 02/10/2019   Urinary incontinence 01/02/2018   Left groin pain 01/02/2018   Allergic rhinitis 01/02/2018   Healthcare maintenance 09/12/2017    Acute nonintractable headache 02/03/2017   Abnormal stress test    Symptomatic sinus bradycardia    Left sided numbness    Muscle fasciculation    Primary osteoarthritis of right knee 07/23/2016   Plantar fasciitis of right foot 05/07/2016   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/05/2016   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   Dizziness 03/21/2015   Shortness of breath 03/21/2015   OSA on CPAP    PAF (paroxysmal atrial fibrillation) ([facility])    Palpitations    Atypical chest pain 03/03/2015   Left knee pain 10/13/2014   Atrial fibrillation ([place]) 03/14/2014   Right knee pain 11/10/2012   Sinusitis 10/23/2012   Left hip pain 06/17/2011   OSA (obstructive sleep apnea) 05/17/2011   Chronic back pain 10/29/2010   INSOMNIA 09/19/2008   Gout 05/13/2007   Hyperlipidemia 10/02/2006   Morbid obesity ([place]) 10/02/2006   Migraine 10/02/2006   HYPERTENSION, BENIGN SYSTEMIC 10/02/2006   GASTROESOPHAGEAL REFLUX, NO ESOPHAGITIS 10/02/2006    REFERRING DIAG: Y30.160 (ICD-10-CM) - Posterior tibial tendinitis of left lower extremity  THERAPY DIAG: Posterior tibial tendinitis of left lower extremity   Rationale for Evaluation and Treatment Rehabilitation  PERTINENT HISTORY: HTN; AFib  PRECAUTIONS: None  SUBJECTIVE: 3/10 pain  at worst, today numbness is main issue in great toe but does relate some low back and L sciatica issues  PAIN:  Are you having pain?  Yes: NPRS scale: 6/10 (10/10 at worst) Pain location: R heel and R medial foot Pain description: sharp and numb Aggravating factors: work, prolonged sitting Relieving factors: None   OBJECTIVE: (objective measures completed at initial evaluation unless otherwise dated)   DIAGNOSTIC FINDINGS:            See imaging    PATIENT SURVEYS:  FOTO: 57% function; 68% predicted   COGNITION:           Overall cognitive status: Within functional limits for tasks assessed                           SENSATION: Light touch: Impaired - medial L ankle decrease   POSTURE:            Large body habitus; increased lordosis   PALPATION: TTP to medial L ankle, L heel   LOWER EXTREMITY ROM:      AROM Right 02/13/2022   Left 02/13/2022   Hip flexion       Hip extension      Hip abduction      Hip adduction      Hip internal rotation      Hip external rotation      Knee flexion      Knee extension      Ankle dorsiflexion   -8  Ankle plantarflexion      Ankle inversion      Ankle eversion                            (Blank rows = not tested)     LOWER EXTREMITY MMT:     MMT Right 02/13/2022  Left 02/13/2022   Hip flexion       Hip extension      Hip abduction      Hip adduction      Hip external rotation      Hip internal rotation      Knee extension      Knee flexion      Ankle dorsiflexion  5/5 3+/5  Ankle plantarflexion 5/5 3+/5  Ankle inversion 5/5 3+/5  Ankle eversion 5/5 3+/5  Grossly      (Blank rows = not tested)   LOWER EXTREMITY SPECIAL TESTS:  Ankle special tests: Tinel's test-Posterior tibialis: positive    FUNCTIONAL TESTS:  30 Second Sit to Stand: 9 reps SLS: 5 seconds L   GAIT: Distance walked: 24f Assistive device utilized: None Level of assistance: Complete Independence Comments: slight antalgic gait L   TODAY'S TREATMENT: [facility] Adult PT Treatment:                                                DATE: 02/27/22 Therapeutic Exercise: Nustep L4 8 min Seated hamstring stretch L 30s x2 L piriformis stretch 30s x2 PF against wall 15x DF against wall 15x Marching against wall 15/15 L sciatic tensioner in sitting 15x PF stretch L 30s STM L gastroc/eversion mobs to decrease paresthesias from suspected tarsal tunnel     [facility] Adult PT Treatment:  DATE: 02/21/2022 Therapeutic Exercise: Ankle 4-way RTB 2 x 10 each Seated heel raise with ball btwn heels 3 x 10  Slant board calf stretch 2x1' Standing  heel raises 2x10 Standing toe raises 3x10 Towel slides inv 4# weight x2 Towel slides eversion no weight x2 (pain) Neuromuscular re-ed: Romberg stance x30" EC Tandem stance x30" BIL SLS x11" Lt Romberg stance on foam x30" EC Tandem stance on airex x30" BIL  [facility] Adult PT Treatment:                                                DATE: 02/13/2022 Therapeutic Exercise: Calf stretch with towel x 30" L Ankle 4-way RTB x 5 each Seated heel raise with ball btwn heels x 10    PATIENT EDUCATION:  Education details: eval findings, FOTO, HEP, POC Person educated: Patient Education method: Explanation, Demonstration, and Handouts Education comprehension: verbalized understanding and returned demonstration   HOME EXERCISE PROGRAM: Access Code: [email or web address] URL: [email or web address] Date: 02/13/2022 Prepared by: Octavio Manns   Exercises - Long Sitting Calf Stretch with Strap  - 2 x daily - 7 x weekly - 2-3 reps - 30 sec hold - Ankle Dorsiflexion with Resistance  - 1 x daily - 7 x weekly - 3 sets - 10 reps - red theraband hold - Ankle Inversion with Resistance  - 1 x daily - 7 x weekly - 3 sets - 10 reps - red theraband hold - Ankle Eversion with Resistance  - 1 x daily - 7 x weekly - 3 sets - 10 reps - red theraband hold - Ankle Plantar Flexion with Resistance  - 1 x daily - 7 x weekly - 3 sets - 10 reps - red theraband hold - Seated Calf Raise With Small Ball at Heels  - 1 x daily - 7 x weekly - 3 sets - 10 reps   ASSESSMENT:   CLINICAL IMPRESSION: Arrives to session w/o pain, just numbness in great toe as main concern.  Today's session introduced aerobic work, CKC strengthening, stretching and eccentric tasks.  STM to L piriformis and gastroc unremarkable, some sciatic irritation noted during assessment of  LLE flexibility, positive tarsal tunnel signs remain and most likely cause of great toe paresthesias.  Added L piriformis stretch to HEP   OBJECTIVE IMPAIRMENTS decreased  activity tolerance, decreased balance, decreased mobility, difficulty walking, decreased ROM, decreased strength, obesity, and pain    ACTIVITY LIMITATIONS sitting, standing, squatting, stairs, and transfers   PARTICIPATION LIMITATIONS: community activity, occupation, and yard work   PERSONAL FACTORS Fitness and 1-2 comorbidities: HTN; AFib  are also affecting patient's functional outcome.    REHAB POTENTIAL: Excellent   CLINICAL DECISION MAKING: Stable/uncomplicated   EVALUATION COMPLEXITY: Low     GOALS: Goals reviewed with patient? No   SHORT TERM GOALS: Target date: 03/06/2022  Pt will be compliant and knowledgeable with initial HEP for improved comfort and carryover Baseline: initial HEP given  Goal status: INITIAL   2.  Pt will self report L foot/ankle pain no greater than 4/10 for improved comfort and functional ability Baseline: 6/10 at worst Goal status: INITIAL    LONG TERM GOALS: Target date: 04/10/2022    Pt will self report L foot/ankle pain no greater than 1-2/10 for improved comfort and functional ability Baseline: 6/10 at worst Goal status: INITIAL   2.  Pt  will improve FOTO function score to no less than 68% as proxy for functional improvement Baseline: 57% function Goal status: INITIAL   3.  Pt will increase 30 Second Sit to Stand rep count to no less than 11 reps for improved balance, strength, and functional mobility Baseline: 9 reps  Goal status: INITIAL    4.  Pt will be able to hold L SLS for at least 30 seconds for improved balance and functional mobility Baseline: 5 seconds Goal status: INITIAL   5.  Pt will improve L ankle DF to no less than 5 deg for improved comfort and functional mobility with work and community activity Baseline: -8 deg Goal status: INITIAL   PLAN: PT FREQUENCY: 1-2x/week   PT DURATION: 8 weeks   PLANNED INTERVENTIONS: Therapeutic exercises, Therapeutic activity, Neuromuscular re-education, Balance training, Gait  training, Patient/Family education, Joint mobilization, Aquatic Therapy, Dry Needling, Electrical stimulation, Cryotherapy, Moist heat, Manual therapy, and Re-evaluation   PLAN FOR NEXT SESSION: assess HEP response, progress ankle strength/balance    Lanice Shirts, PT 02/27/2022, 6:49 PM

## 2022-02-27 ENCOUNTER — Ambulatory Visit: Payer: 59

## 2022-02-27 DIAGNOSIS — R2689 Other abnormalities of gait and mobility: Secondary | ICD-10-CM

## 2022-02-27 DIAGNOSIS — M6281 Muscle weakness (generalized): Secondary | ICD-10-CM

## 2022-02-27 DIAGNOSIS — M25572 Pain in left ankle and joints of left foot: Secondary | ICD-10-CM

## 2022-03-02 ENCOUNTER — Other Ambulatory Visit: Payer: Self-pay | Admitting: Family Medicine

## 2022-03-06 ENCOUNTER — Ambulatory Visit: Payer: 59

## 2022-03-07 ENCOUNTER — Other Ambulatory Visit: Payer: Self-pay | Admitting: Family Medicine

## 2022-03-07 ENCOUNTER — Other Ambulatory Visit: Payer: Self-pay | Admitting: Cardiovascular Disease

## 2022-03-13 ENCOUNTER — Ambulatory Visit: Payer: 59 | Attending: Family Medicine

## 2022-03-13 DIAGNOSIS — R2689 Other abnormalities of gait and mobility: Secondary | ICD-10-CM | POA: Diagnosis present

## 2022-03-13 DIAGNOSIS — M25572 Pain in left ankle and joints of left foot: Secondary | ICD-10-CM | POA: Insufficient documentation

## 2022-03-13 DIAGNOSIS — M6281 Muscle weakness (generalized): Secondary | ICD-10-CM | POA: Diagnosis present

## 2022-03-13 NOTE — Therapy (Addendum)
OUTPATIENT PHYSICAL THERAPY TREATMENT NOTE/DC SUMMARY   Patient Name: [name] MRN: [id number] DOB:May 01, 1966, 56 y.o., male Today's Date: 03/13/2022  PCP: [name], MD  REFERRING PROVIDER: Dene Gentry, MD PHYSICAL THERAPY DISCHARGE SUMMARY  Visits from Start of Care: 4  Current functional level related to goals / functional outcomes: UTA   Remaining deficits: UTA   Education / Equipment: HEP   Patient agrees to discharge. Patient goals were partially met. Patient is being discharged due to not returning since the last visit.  END OF SESSION:   PT End of Session - 03/13/22 1614     Visit Number 4    Number of Visits 17    Date for PT Re-Evaluation 04/10/22    Authorization Type UHC    PT Start Time 1615    PT Stop Time 1655    PT Time Calculation (min) 40 min    Activity Tolerance Patient tolerated treatment well    Behavior During Therapy WFL for tasks assessed/performed              Past Medical History:  Diagnosis Date   Allergy    Arthritis    "knees" (01/22/2017)   Cataract    Chronic lower back pain    GERD (gastroesophageal reflux disease)    Gout    "no flares in awhile" (01/22/2017)   History of MRI of cervical spine 09/25/2010   normal; left shoulder normal as well   Hypertension    Migraine    "~ q 6 months" (01/22/2017)   Morbid obesity ([place])    "I've lost 43# in 15 weeks w/weight watchers" (01/22/2017)   OSA on CPAP    Paroxysmal atrial fibrillation ([place]) 02/2014   chads2vasc score is 1   Past Surgical History:  Procedure Laterality Date   CARDIAC CATHETERIZATION  05/2005   "no blockage"   KNEE ARTHROSCOPY Right 02/2013   LEFT HEART CATH AND CORONARY ANGIOGRAPHY N/A 01/27/2017   Procedure: Left Heart Cath and Coronary Angiography;  Surgeon: Troy Sine, MD;  Location: [facility];  Service: Cardiovascular;  Laterality: N/A;   Patient Active Problem List   Diagnosis Date Noted   Acute left ankle pain  10/25/2021   Overdose 02/18/2021   Chronic left-sided low back pain without sciatica 09/05/2020   Dyshydrosis 08/19/2020   Tendonitis of wrist, left 08/19/2020   Body mass index 40.0-44.9, adult ([place]) 02/10/2019   Urinary incontinence 01/02/2018   Left groin pain 01/02/2018   Allergic rhinitis 01/02/2018   Healthcare maintenance 09/12/2017   Acute nonintractable headache 02/03/2017   Abnormal stress test    Symptomatic sinus bradycardia    Left sided numbness    Muscle fasciculation    Primary osteoarthritis of right knee 07/23/2016   Plantar fasciitis of right foot 05/07/2016   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/05/2016   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   Dizziness 03/21/2015   Shortness of breath 03/21/2015   OSA on CPAP    PAF (paroxysmal atrial fibrillation) (HCC)    Palpitations    Atypical chest pain 03/03/2015   Left knee pain 10/13/2014   Atrial fibrillation ([place]) 03/14/2014   Right knee pain 11/10/2012   Sinusitis 10/23/2012   Left hip pain 06/17/2011   OSA (obstructive sleep apnea) 05/17/2011   Chronic back pain 10/29/2010   INSOMNIA 09/19/2008   Gout 05/13/2007   Hyperlipidemia 10/02/2006   Morbid obesity ([place]) 10/02/2006   Migraine 10/02/2006  HYPERTENSION, BENIGN SYSTEMIC 10/02/2006   GASTROESOPHAGEAL REFLUX, NO ESOPHAGITIS 10/02/2006    REFERRING DIAG: H60.737 (ICD-10-CM) - Posterior tibial tendinitis of left lower extremity  THERAPY DIAG: Posterior tibial tendinitis of left lower extremity   Rationale for Evaluation and Treatment Rehabilitation  PERTINENT HISTORY: HTN; AFib  PRECAUTIONS: None  SUBJECTIVE: Pain intensity has decreased but still notes paresthesias into arch on L foot  PAIN:  Are you having pain?  Yes: NPRS scale: 6/10 (10/10 at worst) Pain location: R heel and R medial foot Pain description: sharp and numb Aggravating factors: work, prolonged sitting Relieving factors:  None   OBJECTIVE: (objective measures completed at initial evaluation unless otherwise dated)   DIAGNOSTIC FINDINGS:            See imaging    PATIENT SURVEYS:  FOTO: 57% function; 68% predicted   COGNITION:           Overall cognitive status: Within functional limits for tasks assessed                          SENSATION: Light touch: Impaired - medial L ankle decrease   POSTURE:            Large body habitus; increased lordosis   PALPATION: TTP to medial L ankle, L heel   LOWER EXTREMITY ROM:      AROM Right 02/13/2022   Left 02/13/2022   Hip flexion       Hip extension      Hip abduction      Hip adduction      Hip internal rotation      Hip external rotation      Knee flexion      Knee extension      Ankle dorsiflexion   -8  Ankle plantarflexion      Ankle inversion      Ankle eversion                            (Blank rows = not tested)     LOWER EXTREMITY MMT:     MMT Right 02/13/2022  Left 02/13/2022   Hip flexion       Hip extension      Hip abduction      Hip adduction      Hip external rotation      Hip internal rotation      Knee extension      Knee flexion      Ankle dorsiflexion  5/5 3+/5  Ankle plantarflexion 5/5 3+/5  Ankle inversion 5/5 3+/5  Ankle eversion 5/5 3+/5  Grossly      (Blank rows = not tested)   LOWER EXTREMITY SPECIAL TESTS:  Ankle special tests: Tinel's test-Posterior tibialis: positive    FUNCTIONAL TESTS:  30 Second Sit to Stand: 9 reps SLS: 5 seconds L   GAIT: Distance walked: 57f Assistive device utilized: None Level of assistance: Complete Independence Comments: slight antalgic gait L   TODAY'S TREATMENT: [facility] Adult PT Treatment:                                                DATE: 03/13/22 Therapeutic Exercise: Nustep L5 8 min Eccentric L stepdowns 6 inch 15x2 L ankle eversion stretch w/towel 30s x2 PF against  wall 15x2 single leg L  DF against wall 15x Marching against wall 15/15 PF stretch L 30s  x2 Concentric/eccentric heel raises 1s/5s ratio Seated to yoga 15x great toe extension, 15x remaining toe extension  Manual Therapy; STM L gastroc/eversion mobs to decrease paresthesias from suspected tarsal tunnel  Prone anterior talar mobilizations 10x5  [facility] Adult PT Treatment:                                                DATE: 02/27/22 Therapeutic Exercise: Nustep L4 8 min Seated hamstring stretch L 30s x2 L piriformis stretch 30s x2 PF against wall 15x DF against wall 15x Marching against wall 15/15 L sciatic tensioner in sitting 15x PF stretch L 30s STM L gastroc/eversion mobs to decrease paresthesias from suspected tarsal tunnel     [facility] Adult PT Treatment:                                                DATE: 02/21/2022 Therapeutic Exercise: Ankle 4-way RTB 2 x 10 each Seated heel raise with ball btwn heels 3 x 10  Slant board calf stretch 2x1' Standing heel raises 2x10 Standing toe raises 3x10 Towel slides inv 4# weight x2 Towel slides eversion no weight x2 (pain) Neuromuscular re-ed: Romberg stance x30" EC Tandem stance x30" BIL SLS x11" Lt Romberg stance on foam x30" EC Tandem stance on airex x30" BIL  [facility] Adult PT Treatment:                                                DATE: 02/13/2022 Therapeutic Exercise: Calf stretch with towel x 30" L Ankle 4-way RTB x 5 each Seated heel raise with ball btwn heels x 10    PATIENT EDUCATION:  Education details: eval findings, FOTO, HEP, POC Person educated: Patient Education method: Explanation, Demonstration, and Handouts Education comprehension: verbalized understanding and returned demonstration   HOME EXERCISE PROGRAM: Access Code: [email or web address] URL: [email or web address] Date: 03/13/2022 Prepared by: Sharlynn Oliphant  Exercises - Long Sitting Calf Stretch with Strap  - 2 x daily - 7 x weekly - 2-3 reps - 30 sec hold - Seated Calf Raise With Small Ball at [place]  - 1 x daily - 7 x weekly - 3 sets - 10  reps - Seated Great Toe Extension  - 2 x daily - 7 x weekly - 2 sets - 15 reps - Toe Yoga - Alternating Great Toe and Lesser Toe Extension  - 2 x daily - 7 x weekly - 2 sets - 15 reps   ASSESSMENT:   CLINICAL IMPRESSION: Today's session added more stretching tasks to L ankle as well as incorporating eccentric exercises.  Discussed rigid arch supports to provide more support to arch and relieve tarsal tunnel compression.   OBJECTIVE IMPAIRMENTS decreased activity tolerance, decreased balance, decreased mobility, difficulty walking, decreased ROM, decreased strength, obesity, and pain    ACTIVITY LIMITATIONS sitting, standing, squatting, stairs, and transfers   PARTICIPATION LIMITATIONS: community activity, occupation, and yard work   PERSONAL FACTORS Fitness and 1-2 comorbidities: HTN; AFib  are also affecting  patient's functional outcome.    REHAB POTENTIAL: Excellent   CLINICAL DECISION MAKING: Stable/uncomplicated   EVALUATION COMPLEXITY: Low     GOALS: Goals reviewed with patient? No   SHORT TERM GOALS: Target date: 03/06/2022  Pt will be compliant and knowledgeable with initial HEP for improved comfort and carryover Baseline: initial HEP given  Goal status: INITIAL   2.  Pt will self report L foot/ankle pain no greater than 4/10 for improved comfort and functional ability Baseline: 6/10 at worst Goal status: INITIAL    LONG TERM GOALS: Target date: 04/10/2022    Pt will self report L foot/ankle pain no greater than 1-2/10 for improved comfort and functional ability Baseline: 6/10 at worst Goal status: INITIAL   2.  Pt will improve FOTO function score to no less than 68% as proxy for functional improvement Baseline: 57% function Goal status: INITIAL   3.  Pt will increase 30 Second Sit to Stand rep count to no less than 11 reps for improved balance, strength, and functional mobility Baseline: 9 reps  Goal status: INITIAL    4.  Pt will be able to hold L SLS for at  least 30 seconds for improved balance and functional mobility Baseline: 5 seconds Goal status: INITIAL   5.  Pt will improve L ankle DF to no less than 5 deg for improved comfort and functional mobility with work and community activity Baseline: -8 deg Goal status: INITIAL   PLAN: PT FREQUENCY: 1-2x/week   PT DURATION: 8 weeks   PLANNED INTERVENTIONS: Therapeutic exercises, Therapeutic activity, Neuromuscular re-education, Balance training, Gait training, Patient/Family education, Joint mobilization, Aquatic Therapy, Dry Needling, Electrical stimulation, Cryotherapy, Moist heat, Manual therapy, and Re-evaluation   PLAN FOR NEXT SESSION: assess HEP response, progress ankle strength/balance, arch supports?, exercise benefit    Lanice Shirts, PT 03/13/2022, 5:02 PM

## 2022-03-20 ENCOUNTER — Ambulatory Visit: Payer: 59

## 2022-03-25 ENCOUNTER — Ambulatory Visit: Payer: 59 | Admitting: Family Medicine

## 2022-03-26 ENCOUNTER — Emergency Department (HOSPITAL_BASED_OUTPATIENT_CLINIC_OR_DEPARTMENT_OTHER)
Admission: EM | Admit: 2022-03-26 | Discharge: 2022-03-26 | Disposition: A | Discharge status: Home / Self Care | Payer: 59 | Attending: Emergency Medicine | Admitting: Emergency Medicine

## 2022-03-26 ENCOUNTER — Encounter (HOSPITAL_BASED_OUTPATIENT_CLINIC_OR_DEPARTMENT_OTHER): Payer: Self-pay | Admitting: Emergency Medicine

## 2022-03-26 ENCOUNTER — Emergency Department (HOSPITAL_BASED_OUTPATIENT_CLINIC_OR_DEPARTMENT_OTHER): Payer: 59

## 2022-03-26 ENCOUNTER — Ambulatory Visit: Payer: 59 | Admitting: Student

## 2022-03-26 ENCOUNTER — Encounter: Payer: Self-pay | Admitting: Emergency Medicine

## 2022-03-26 ENCOUNTER — Ambulatory Visit
Admission: EM | Admit: 2022-03-26 | Discharge: 2022-03-26 | Disposition: A | Discharge status: Other Acute Inpatient Hospital | Payer: 59

## 2022-03-26 ENCOUNTER — Telehealth: Payer: Self-pay

## 2022-03-26 DIAGNOSIS — R1012 Left upper quadrant pain: Secondary | ICD-10-CM

## 2022-03-26 DIAGNOSIS — R109 Unspecified abdominal pain: Secondary | ICD-10-CM | POA: Diagnosis present

## 2022-03-26 DIAGNOSIS — B9689 Other specified bacterial agents as the cause of diseases classified elsewhere: Secondary | ICD-10-CM | POA: Insufficient documentation

## 2022-03-26 DIAGNOSIS — I10 Essential (primary) hypertension: Secondary | ICD-10-CM | POA: Insufficient documentation

## 2022-03-26 DIAGNOSIS — N390 Urinary tract infection, site not specified: Secondary | ICD-10-CM | POA: Diagnosis not present

## 2022-03-26 DIAGNOSIS — Z79899 Other long term (current) drug therapy: Secondary | ICD-10-CM | POA: Insufficient documentation

## 2022-03-26 DIAGNOSIS — N281 Cyst of kidney, acquired: Secondary | ICD-10-CM | POA: Insufficient documentation

## 2022-03-26 LAB — URINALYSIS, MICROSCOPIC (REFLEX): WBC, UA: NONE SEEN WBC/hpf (ref 0–5)

## 2022-03-26 LAB — CBC WITH DIFFERENTIAL/PLATELET
Abs Immature Granulocytes: 0.01 10*3/uL (ref 0.00–0.07)
Basophils Absolute: 0.1 10*3/uL (ref 0.0–0.1)
Basophils Relative: 1 %
Eosinophils Absolute: 0.2 10*3/uL (ref 0.0–0.5)
Eosinophils Relative: 4 %
HCT: 39.2 % (ref 39.0–52.0)
Hemoglobin: 12.9 g/dL — ABNORMAL LOW (ref 13.0–17.0)
Immature Granulocytes: 0 %
Lymphocytes Relative: 39 %
Lymphs Abs: 2.5 10*3/uL (ref 0.7–4.0)
MCH: 26.5 pg (ref 26.0–34.0)
MCHC: 32.9 g/dL (ref 30.0–36.0)
MCV: 80.7 fL (ref 80.0–100.0)
Monocytes Absolute: 0.5 10*3/uL (ref 0.1–1.0)
Monocytes Relative: 7 %
Neutro Abs: 3.2 10*3/uL (ref 1.7–7.7)
Neutrophils Relative %: 49 %
Platelets: 216 10*3/uL (ref 150–400)
RBC: 4.86 MIL/uL (ref 4.22–5.81)
RDW: 13.6 % (ref 11.5–15.5)
WBC: 6.5 10*3/uL (ref 4.0–10.5)
nRBC: 0 % (ref 0.0–0.2)

## 2022-03-26 LAB — COMPREHENSIVE METABOLIC PANEL
ALT: 29 U/L (ref 0–44)
AST: 31 U/L (ref 15–41)
Albumin: 4.1 g/dL (ref 3.5–5.0)
Alkaline Phosphatase: 72 U/L (ref 38–126)
Anion gap: 7 (ref 5–15)
BUN: 16 mg/dL (ref 6–20)
CO2: 26 mmol/L (ref 22–32)
Calcium: 9.3 mg/dL (ref 8.9–10.3)
Chloride: 107 mmol/L (ref 98–111)
Creatinine, Ser: 0.97 mg/dL (ref 0.61–1.24)
GFR, Estimated: >60 mL/min (ref >60)
Glucose, Bld: 128 mg/dL — ABNORMAL HIGH (ref 70–99)
Potassium: 3.7 mmol/L (ref 3.5–5.1)
Sodium: 140 mmol/L (ref 135–145)
Total Bilirubin: 0.6 mg/dL (ref 0.3–1.2)
Total Protein: 7.4 g/dL (ref 6.5–8.1)

## 2022-03-26 LAB — URINALYSIS, ROUTINE W REFLEX MICROSCOPIC
Bilirubin Urine: NEGATIVE
Glucose, UA: NEGATIVE mg/dL
Ketones, ur: NEGATIVE mg/dL
Leukocytes,Ua: NEGATIVE
Nitrite: POSITIVE — AB
Protein, ur: NEGATIVE mg/dL
Specific Gravity, Urine: 1.02 (ref 1.005–1.030)
pH: 6.5 (ref 5.0–8.0)

## 2022-03-26 LAB — LIPASE, BLOOD: Lipase: 42 U/L (ref 11–51)

## 2022-03-26 MED ORDER — CEPHALEXIN 250 MG PO CAPS
500.0000 mg | ORAL_CAPSULE | Freq: Once | ORAL | Status: AC
  Administered 2022-03-26: 500 mg via ORAL
  Filled 2022-03-26: qty 2

## 2022-03-26 MED ORDER — CEPHALEXIN 500 MG PO CAPS: 500.0000 mg | ORAL_CAPSULE | Freq: Three times a day (TID) | ORAL | 0 refills | Status: AC

## 2022-03-26 MED ORDER — IOHEXOL 300 MG/ML  SOLN
125.0000 mL | Freq: Once | INTRAMUSCULAR | Status: AC | PRN
  Administered 2022-03-26: 125 mL via INTRAVENOUS

## 2022-03-26 NOTE — ED Provider Notes (Signed)
[facility] EMERGENCY DEPARTMENT Provider Note   CSN: [id number] Arrival date & time: 03/26/22  1441     History  Chief Complaint  Patient presents with   Abdominal Pain    [name] is a 56 y.o. male with a history of HTN, morbid obesity, paroxysmal Afib, migraines, and degenerative disc disease.  Presenting today with intermittent left sided abdominal pain over the last week.  No specific known aggravators or relievers.  Comes every so often, could be minutes to hours.  Described as strong, and then dissipates.  Mild urinary urgency, however does not believe he is experiencing dysuria or penile symptoms.  Mild reduced bowel movements over the last few days as well.  Still able to pass flatulence.  Denies N/V/D, bloody bowel movements, fevers, chills, chest pain, or shortness of breath.  Denies flank pain, recent injury, or any other complaints at this time.  On eliquis daily for paroxysmal Afib.  The history is provided by the patient and medical records.  Abdominal Pain      Home Medications Prior to Admission medications   Medication Sig Start Date End Date Taking? Authorizing Provider  cephALEXin (KEFLEX) 500 MG capsule Take 1 capsule (500 mg total) by mouth 3 (three) times daily for 7 days. 03/26/22 04/02/22 Yes Prince Rome, PA-C  atorvastatin (LIPITOR) 20 MG tablet Take 1 tablet (20 mg total) by mouth every other day. 03/07/22   Nahser, Wonda Cheng, MD  diclofenac Sodium (VOLTAREN) 1 % GEL Apply 2 g topically daily as needed (For pain). 10/31/21   Ganta, Anupa, DO  dofetilide (TIKOSYN) 500 MCG capsule Take 1 capsule (500 mcg total) by mouth 2 (two) times daily. 01/07/22   Swinyer, Lanice Schwab, NP  ELIQUIS 5 MG TABS tablet Take 1 tablet by mouth twice daily 12/04/21   Sherran Needs, NP  fexofenadine [facility] ALLERGY) 60 MG tablet Take 1 tablet (60 mg total) by mouth daily. 06/04/21   Simmons-Robinson, Makiera, MD  fluticasone (FLONASE) 50 MCG/ACT nasal spray Place 2  sprays into both nostrils daily. 09/14/18   [name], MD  gabapentin (NEURONTIN) 300 MG capsule Take 300 mg by mouth. Not sure how many times a day he is currently taking.    [provider]  losartan (COZAAR) 50 MG tablet Take 1 tablet by mouth once daily 03/04/22   [name], MD  metoprolol succinate (TOPROL-XL) 25 MG 24 hr tablet Take 12.5 mg by mouth as needed. 12/07/21   [provider]  MITIGARE 0.6 MG CAPS Take 1 capsule by mouth once daily 03/08/22   Lind Covert, MD  Multiple Vitamin (MULTIVITAMIN WITH MINERALS) TABS tablet Take 1 tablet by mouth at bedtime.    [provider]  Omega-3 Fatty Acids (FISH OIL) 1000 MG CAPS Take 1,000 mg by mouth in the morning.    [provider]  omeprazole (PRILOSEC) 40 MG capsule TAKE 1 CAPSULE BY MOUTH TWICE DAILY BEFORE A MEAL 12/07/21   Thornton Park, MD  TURMERIC PO Take 1 capsule by mouth every other day.     [provider]      Allergies    Hydrochlorothiazide-propranolol [propranolol-hctz], Penicillins, and Hydrochlorothiazide    Review of Systems   Review of Systems  Gastrointestinal:  Positive for abdominal pain.    Physical Exam Updated Vital Signs BP 131/84   Pulse (!) 53   Temp 97.7 F (36.5 C) (Oral)   Resp 16   Ht '6\' 2"'$  (1.88 m)  Wt (!) 144.2 kg   SpO2 99%   BMI 40.83 kg/m  Physical Exam Vitals and nursing note reviewed.  Constitutional:      General: He is not in acute distress.    Appearance: He is well-developed. He is obese. He is not ill-appearing, toxic-appearing or diaphoretic.  HENT:     Head: Normocephalic and atraumatic.  Eyes:     General: No scleral icterus.    Conjunctiva/sclera: Conjunctivae normal.  Cardiovascular:     Rate and Rhythm: Normal rate and regular rhythm.     Heart sounds: No murmur heard. Pulmonary:     Effort: Pulmonary effort is normal. No respiratory distress.     Breath sounds: Normal breath sounds.  Chest:      Chest wall: No tenderness.  Abdominal:     General: Abdomen is protuberant. Bowel sounds are normal.     Palpations: Abdomen is soft. There is no shifting dullness or fluid wave.     Tenderness: There is abdominal tenderness (Mild, generally left-sided). There is no right CVA tenderness, left CVA tenderness, guarding or rebound. Negative signs include Murphy's sign and McBurney's sign.  Musculoskeletal:        General: No swelling.     Cervical back: Neck supple.  Skin:    General: Skin is warm and dry.     Capillary Refill: Capillary refill takes less than 2 seconds.  Neurological:     Mental Status: He is alert and oriented to person, place, and time.  Psychiatric:        Mood and Affect: Mood normal.     ED Results / Procedures / Treatments   Labs (all labs ordered are listed, but only abnormal results are displayed) Labs Reviewed  COMPREHENSIVE METABOLIC PANEL - Abnormal; Notable for the following components:      Result Value   Glucose, Bld 128 (*)    All other components within normal limits  URINALYSIS, ROUTINE W REFLEX MICROSCOPIC - Abnormal; Notable for the following components:   Hgb urine dipstick TRACE (*)    Nitrite POSITIVE (*)    All other components within normal limits  CBC WITH DIFFERENTIAL/PLATELET - Abnormal; Notable for the following components:   Hemoglobin 12.9 (*)    All other components within normal limits  URINALYSIS, MICROSCOPIC (REFLEX) - Abnormal; Notable for the following components:   Bacteria, UA FEW (*)    All other components within normal limits  LIPASE, BLOOD   EKG None  Radiology CT Abdomen Pelvis W Contrast  Result Date: 03/26/2022 CLINICAL DATA:  Flank pain. EXAM: CT ABDOMEN AND PELVIS WITH CONTRAST TECHNIQUE: Multidetector CT imaging of the abdomen and pelvis was performed using the standard protocol following bolus administration of intravenous contrast. RADIATION DOSE REDUCTION: This exam was performed according to the  departmental dose-optimization program which includes automated exposure control, adjustment of the mA and/or kV according to patient size and/or use of iterative reconstruction technique. CONTRAST:  157m OMNIPAQUE IOHEXOL 300 MG/ML  SOLN COMPARISON:  CT abdomen pelvis dated 05/19/2019. FINDINGS: Lower chest: The visualized lung bases are clear. No intra-abdominal free air or free fluid. Hepatobiliary: Fatty liver. No biliary ductal dilatation the gallbladder is unremarkable Pancreas: Unremarkable. No pancreatic ductal dilatation or surrounding inflammatory changes. Spleen: Normal in size without focal abnormality. Adrenals/Urinary Tract: The adrenal glands unremarkable. There is no hydronephrosis on either side. There is symmetric enhancement and excretion of contrast by both kidneys. Multiple bilateral renal cysts measure up to 4.5 cm in the upper pole  of the right kidney. The visualized ureters and urinary bladder appear unremarkable. Stomach/Bowel: There is no bowel obstruction or active inflammation. The appendix is normal. Vascular/Lymphatic: The abdominal aorta and IVC unremarkable. No portal venous gas. There is no adenopathy. Reproductive: The prostate and seminal vesicles are grossly unremarkable no acute osseous Other: None Musculoskeletal: No acute osseous pathology. IMPRESSION: 1. No acute intra-abdominal or pelvic pathology. 2. Fatty liver. 3. Bilateral renal cysts. Electronically Signed   By: Anner Crete M.D.   On: 03/26/2022 21:28    Procedures Procedures    Medications Ordered in ED Medications  iohexol (OMNIPAQUE) 300 MG/ML solution 125 mL (125 mLs Intravenous Contrast Given 03/26/22 2052)  cephALEXin (KEFLEX) capsule 500 mg (500 mg Oral Given 03/26/22 2228)    ED Course/ Medical Decision Making/ A&P                           Medical Decision Making Amount and/or Complexity of Data Reviewed Labs: ordered. Radiology: ordered.  Risk Prescription drug management.   56 y.o.  male presents to the ED for concern of Abdominal Pain   This involves an extensive number of treatment options, and is a complaint that carries with it a high risk of complications and morbidity.  The emergent differential diagnosis prior to evaluation includes, but is not limited to: constipation, renal calculi, bowel obstruction, diverticulitis, bowel perforation, UTI  This is not an exhaustive differential.   Past Medical History / Co-morbidities / Social History: Hx of HTN, morbid obesity, atrial fibrillation, migraines, and degenerative disc disease Social Determinants of Health include: None  Additional History:  Obtained by chart review.  Notably UC notes from yesterday  Lab Tests: I ordered, and personally interpreted labs.  The pertinent results include:   UA: Positive for nitrites, few bacteria Glucose 128 Lipase 42 Hemoglobin 12.9, mildly decreased, close to baseline  Imaging Studies: I ordered imaging studies including CT abdomen pelvis.   I independently visualized and interpreted imaging which showed  1. No acute intra-abdominal or pelvic pathology.  2. Fatty liver.  3. Bilateral renal cysts. I agree with the radiologist interpretation.  ED Course: Pt well-appearing on exam.  Nontoxic, nonseptic appearing in NAD.  Presenting with intermittent LUQ abdominal tenderness.  Pain is sharp for a few seconds and then disappears.  Without active abdominal pain.  Initial suspicion for constipation vs bowel obstruction.  Not worsened with exertion.  UA with evidence of UTI, positive for nitrites.  Hx of Afib on eliquis, however does not appear to be in Afib at this time.  No evidence of metabolic acidosis, no elevated WBC.  Lipase normal.  Electrolytes normal.  Abdomen with minimal to no tenderness on exam, with any tenderness appreciated in the left lumbar.  Overall somewhat difficult to distinguish based on body habitus.  Without bloody stools.  Gradual onset of discomfort.  These  findings are less suggestive of mesenteric ischemia.  Plan to proceed with CT imaging for further evaluation. Upon reevaluation, patient status unchanged -- still without pain since arrival.  CT imaging negative for bowel obstruction, bowel perforation, diverticulosis, renal calculi, or constipation.  Though with incidental finding of bilateral renal cysts.   Overall, I am uncertain the exact etiology of the patient's symptoms.  However, I do not believe he is currently experiencing a medical, surgical, or psychiatric emergency.  Plan to treat UTI with close urology follow-up.  Patient with record of allergy to penicillins, described as joint stiffening,  however without rash or anaphylaxis reaction to any of the penicillins.  Therefore I believe a cephalosporin may be appropriate.  Advised if any allergic reaction develops, to stop taking this medication immediately return to the ED.  Also recommend follow-up with nephrology due to suspicion of possible polycystic kidney disease.  Patient satisfied with today's encounter.  Patient in NAD and in good condition at time of discharge.  Disposition: After consideration the patient's encounter today, I do not feel today's workup suggests an emergent condition requiring admission or immediate intervention beyond what has been performed at this time.  Safe for discharge; instructed to return immediately for worsening symptoms, change in symptoms or any other concerns.  I have reviewed the patients home medicines and have made adjustments as needed.  Discussed course of treatment with the patient, whom demonstrated understanding.  Patient in agreement and has no further questions.    I discussed this case with my attending physician Dr. Sherry Ruffing, who agreed with the proposed treatment course and cosigned this note including patient's presenting symptoms, physical exam, and planned diagnostics and interventions.  Attending physician stated agreement with plan or made  changes to plan which were implemented.     This chart was dictated using voice recognition software.  Despite best efforts to proofread, errors can occur which can change the documentation meaning.         Final Clinical Impression(s) / ED Diagnoses Final diagnoses:  Left upper quadrant abdominal pain  Urinary tract infection without hematuria, site unspecified  Kidney cysts    Rx / DC Orders ED Discharge Orders          Ordered    cephALEXin (KEFLEX) 500 MG capsule  3 times daily        03/26/22 2217              Prince Rome, PA-C [birth year] 1428    Tegeler, Gwenyth Allegra, MD 03/29/22 [phone]

## 2022-03-26 NOTE — Discharge Instructions (Addendum)
You provided the contact information for Dr. Louis Meckel, a local urologist.  Please follow-up with him within the next 2 to 3 days for reevaluation continue medical management, especially considering your recent diagnosis of UTI.  Imaging today also shows some cysts within/on the kidneys themselves.  For this reason, you have been provided the contact information for Dr. Candiss Norse, a local nephrologist to discuss this further as well.  An antibiotic by the name of Keflex has been sent to your pharmacy.  Take 1 capsule every 8 hours for the next 7 days.  If you start developing a rash, shortness of breath, or anaphylaxis stop taking this and return to the ED immediately.  Otherwise, take the antibiotics until the prescription has been completed.  Always take with plenty of food and water.  Return to the ED for any new or worsening symptoms as discussed.

## 2022-03-26 NOTE — ED Triage Notes (Signed)
PT is present today with left side pain. Pt states that his pain started one week ago.

## 2022-03-26 NOTE — ED Notes (Signed)
Patient is being discharged from the Urgent Care and sent to the Emergency Department via POV . Per HM, patient is in need of higher level of care due to abd pain. Patient is aware and verbalizes understanding of plan of care.  Vitals:   03/26/22 1219  BP: 119/77  Pulse: (!) 57  Resp: 18  Temp: 97.9 F (36.6 C)  SpO2: 94%

## 2022-03-26 NOTE — ED Provider Notes (Signed)
[facility]    CSN: [id number] Arrival date & time: 03/26/22  1140      History   Chief Complaint Chief Complaint  Patient presents with   Abdominal Pain    HPI [name] is a 56 y.o. male.   Patient presents with left-sided pain that has been intermittent over the past week.  Patient reports that it when it comes it is 10/10 on pain scale.  He is not able to characterize pain.  Patient reports that movement sometimes exacerbates pain and eating does not attribute to pain.  Denies history of similar pain.  Denies nausea, vomiting, diarrhea, constipation, blood in stool.  Patient having regular bowel movements.  Denies any associated urinary symptoms.  Denies any injury to the area.  Patient has taken Tylenol with no improvement in pain.   Abdominal Pain   Past Medical History:  Diagnosis Date   Allergy    Arthritis    "knees" (01/22/2017)   Cataract    Chronic lower back pain    GERD (gastroesophageal reflux disease)    Gout    "no flares in awhile" (01/22/2017)   History of MRI of cervical spine 09/25/2010   normal; left shoulder normal as well   Hypertension    Migraine    "~ q 6 months" (01/22/2017)   Morbid obesity ([place])    "I've lost 43# in 15 weeks w/weight watchers" (01/22/2017)   OSA on CPAP    Paroxysmal atrial fibrillation ([place]) 02/2014   chads2vasc score is 1    Patient Active Problem List   Diagnosis Date Noted   Acute left ankle pain 10/25/2021   Overdose 02/18/2021   Chronic left-sided low back pain without sciatica 09/05/2020   Dyshydrosis 08/19/2020   Tendonitis of wrist, left 08/19/2020   Body mass index 40.0-44.9, adult ([place]) 02/10/2019   Urinary incontinence 01/02/2018   Left groin pain 01/02/2018   Allergic rhinitis 01/02/2018   Healthcare maintenance 09/12/2017   Acute nonintractable headache 02/03/2017   Abnormal stress test    Symptomatic sinus bradycardia    Left sided numbness    Muscle fasciculation    Primary  osteoarthritis of right knee 07/23/2016   Plantar fasciitis of right foot 05/07/2016   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/05/2016   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   Dizziness 03/21/2015   Shortness of breath 03/21/2015   OSA on CPAP    PAF (paroxysmal atrial fibrillation) ([facility])    Palpitations    Atypical chest pain 03/03/2015   Left knee pain 10/13/2014   Atrial fibrillation ([place]) 03/14/2014   Right knee pain 11/10/2012   Sinusitis 10/23/2012   Left hip pain 06/17/2011   OSA (obstructive sleep apnea) 05/17/2011   Chronic back pain 10/29/2010   INSOMNIA 09/19/2008   Gout 05/13/2007   Hyperlipidemia 10/02/2006   Morbid obesity ([place]) 10/02/2006   Migraine 10/02/2006   HYPERTENSION, BENIGN SYSTEMIC 10/02/2006   GASTROESOPHAGEAL REFLUX, NO ESOPHAGITIS 10/02/2006    Past Surgical History:  Procedure Laterality Date   CARDIAC CATHETERIZATION  05/2005   "no blockage"   KNEE ARTHROSCOPY Right 02/2013   LEFT HEART CATH AND CORONARY ANGIOGRAPHY N/A 01/27/2017   Procedure: Left Heart Cath and Coronary Angiography;  Surgeon: Troy Sine, MD;  Location: [facility];  Service: Cardiovascular;  Laterality: N/A;       Home Medications    Prior to Admission medications   Medication Sig Start Date End Date Taking?  Authorizing Provider  atorvastatin (LIPITOR) 20 MG tablet Take 1 tablet (20 mg total) by mouth every other day. 03/07/22   Nahser, Wonda Cheng, MD  diclofenac Sodium (VOLTAREN) 1 % GEL Apply 2 g topically daily as needed (For pain). 10/31/21   Ganta, Anupa, DO  dofetilide (TIKOSYN) 500 MCG capsule Take 1 capsule (500 mcg total) by mouth 2 (two) times daily. 01/07/22   Swinyer, Lanice Schwab, NP  ELIQUIS 5 MG TABS tablet Take 1 tablet by mouth twice daily 12/04/21   Sherran Needs, NP  fexofenadine [facility] ALLERGY) 60 MG tablet Take 1 tablet (60 mg total) by mouth daily. 06/04/21   Simmons-Robinson, Makiera, MD  fluticasone  (FLONASE) 50 MCG/ACT nasal spray Place 2 sprays into both nostrils daily. 09/14/18   [name], MD  gabapentin (NEURONTIN) 300 MG capsule Take 300 mg by mouth. Not sure how many times a day he is currently taking.    [provider]  losartan (COZAAR) 50 MG tablet Take 1 tablet by mouth once daily 03/04/22   [name], MD  metoprolol succinate (TOPROL-XL) 25 MG 24 hr tablet Take 12.5 mg by mouth as needed. 12/07/21   [provider]  MITIGARE 0.6 MG CAPS Take 1 capsule by mouth once daily 03/08/22   Lind Covert, MD  Multiple Vitamin (MULTIVITAMIN WITH MINERALS) TABS tablet Take 1 tablet by mouth at bedtime.    [provider]  Omega-3 Fatty Acids (FISH OIL) 1000 MG CAPS Take 1,000 mg by mouth in the morning.    [provider]  omeprazole (PRILOSEC) 40 MG capsule TAKE 1 CAPSULE BY MOUTH TWICE DAILY BEFORE A MEAL 12/07/21   Thornton Park, MD  TURMERIC PO Take 1 capsule by mouth every other day.     [provider]    Family History Family History  Problem Relation Age of Onset   Atrial fibrillation Mother 55       had a stroke   Hypertension Father    Diabetes Father    Colon cancer Neg Hx    Esophageal cancer Neg Hx    Stomach cancer Neg Hx     Social History Social History   Tobacco Use   Smoking status: Former    Packs/day: 1.00    Years: 10.00    Total pack years: 10.00    Types: Cigarettes    Quit date: 04/02/1994    Years since quitting: 28.0   Smokeless tobacco: Former    Types: Chew   Tobacco comments:    "chewed in my teens"  Vaping Use   Vaping Use: Never used  Substance Use Topics   Alcohol use: No    Alcohol/week: 0.0 standard drinks of alcohol    Comment: "stopped in 1994"   Drug use: Not Currently    Types: Marijuana    Comment: "stopped marijuna in 1994"     Allergies   Hydrochlorothiazide-propranolol [propranolol-hctz], Penicillins, and Hydrochlorothiazide   Review of  Systems Review of Systems Per HPI  Physical Exam Triage Vital Signs ED Triage Vitals  Enc Vitals Group     BP 03/26/22 1219 119/77     Pulse Rate 03/26/22 1219 (!) 57     Resp 03/26/22 1219 18     Temp 03/26/22 1219 97.9 F (36.6 C)     Temp src --      SpO2 03/26/22 1219 94 %     Weight --      Height --  Head Circumference --      Peak Flow --      Pain Score 03/26/22 1218 10     Pain Loc --      Pain Edu? --      Excl. in [place]? --    No data found.  Updated Vital Signs BP 119/77   Pulse (!) 57   Temp 97.9 F (36.6 C)   Resp 18   SpO2 94%   Visual Acuity Right Eye Distance:   Left Eye Distance:   Bilateral Distance:    Right Eye Near:   Left Eye Near:    Bilateral Near:     Physical Exam Constitutional:      General: He is not in acute distress.    Appearance: Normal appearance. He is not toxic-appearing or diaphoretic.     Comments: Patient intermittently wincing when pain occurs.  HENT:     Head: Normocephalic and atraumatic.  Eyes:     Extraocular Movements: Extraocular movements intact.     Conjunctiva/sclera: Conjunctivae normal.  Cardiovascular:     Rate and Rhythm: Normal rate and regular rhythm.     Pulses: Normal pulses.     Heart sounds: Normal heart sounds.  Pulmonary:     Effort: Pulmonary effort is normal. No respiratory distress.     Breath sounds: Normal breath sounds.  Chest:     Chest wall: No tenderness.  Abdominal:     General: Bowel sounds are normal. There is no distension.     Palpations: Abdomen is soft.     Tenderness: There is no abdominal tenderness.  Neurological:     General: No focal deficit present.     Mental Status: He is alert and oriented to person, place, and time. Mental status is at baseline.  Psychiatric:        Mood and Affect: Mood normal.        Behavior: Behavior normal.        Thought Content: Thought content normal.        Judgment: Judgment normal.      UC Treatments / Results  Labs (all  labs ordered are listed, but only abnormal results are displayed) Labs Reviewed - No data to display  EKG   Radiology No results found.  Procedures Procedures (including critical care time)  Medications Ordered in UC Medications - No data to display  Initial Impression / Assessment and Plan / UC Course  I have reviewed the triage vital signs and the nursing notes.  Pertinent labs & imaging results that were available during my care of the patient were reviewed by me and considered in my medical decision making (see chart for details).     Patient is intermittently wincing when pain occurs.  Patient appears to be very uncomfortable.  Therefore, I am concerned that patient may need imaging of the abdomen.  Pain is also not reproducible with movement or palpation so low suspicion for musculoskeletal etiology.  Do not have resources for imaging in urgent care so patient was encouraged to go to the hospital for further evaluation and management.  Patient was agreeable with plan.  Vital signs stable at discharge.  Agree with patient self transport to the hospital. Final Clinical Impressions(s) / UC Diagnoses   Final diagnoses:  Abdominal pain, left upper quadrant     Discharge Instructions      Please go to the emergency department as soon as you leave urgent care for further evaluation and management.  ED Prescriptions   None    PDMP not reviewed this encounter.   Teodora Medici, [place] 03/26/22 [phone]

## 2022-03-26 NOTE — Discharge Instructions (Signed)
Please go to the emergency department as soon as you leave urgent care for further evaluation and management. ?

## 2022-03-26 NOTE — Telephone Encounter (Signed)
Patient scheduled for same day appointment with Dr. Ronnald Ramp for abdominal pain. Patient just left urgent care for same concern. Upon chart review, patient was advised to proceed to the ED for further evaluation and imaging.   Per request from Dr. Ronnald Ramp, called patient to discuss further. Patient thought that we would be able to provide this imaging. Advised that due to severe abdominal pain and urgent need of imaging, we would recommend proceeding to the ED, per provider instructions.   Canceled appointment and patient reports that he will proceed to the ED for further evaluation.   Talbot Grumbling, RN

## 2022-03-26 NOTE — ED Triage Notes (Signed)
LUQ abdominal pain x 1 week. States pain is intermittent. Denies n/v/d, chest pain, shob, fevers.

## 2022-03-27 ENCOUNTER — Ambulatory Visit: Payer: 59

## 2022-03-31 ENCOUNTER — Other Ambulatory Visit (HOSPITAL_COMMUNITY): Payer: Self-pay | Admitting: Nurse Practitioner

## 2022-04-03 ENCOUNTER — Other Ambulatory Visit: Payer: Self-pay | Admitting: Family Medicine

## 2022-04-03 ENCOUNTER — Ambulatory Visit: Payer: 59

## 2022-05-21 ENCOUNTER — Encounter: Payer: Self-pay | Admitting: Family Medicine

## 2022-05-21 ENCOUNTER — Ambulatory Visit (INDEPENDENT_AMBULATORY_CARE_PROVIDER_SITE_OTHER): Payer: 59 | Admitting: Family Medicine

## 2022-05-21 VITALS — BP 128/80 | HR 64 | Temp 97.5°F | Ht 74.0 in | Wt 325.6 lb

## 2022-05-21 DIAGNOSIS — N281 Cyst of kidney, acquired: Secondary | ICD-10-CM | POA: Insufficient documentation

## 2022-05-21 DIAGNOSIS — R1012 Left upper quadrant pain: Secondary | ICD-10-CM

## 2022-05-21 DIAGNOSIS — R32 Unspecified urinary incontinence: Secondary | ICD-10-CM | POA: Diagnosis not present

## 2022-05-21 DIAGNOSIS — J329 Chronic sinusitis, unspecified: Secondary | ICD-10-CM

## 2022-05-21 MED ORDER — BACLOFEN 10 MG PO TABS: 5.0000 mg | ORAL_TABLET | Freq: Every day | ORAL | 0 refills | Status: DC | PRN

## 2022-05-21 NOTE — Assessment & Plan Note (Signed)
Recommended nasal rinses using sterile water to remove upper respiratory congestion and alleviate chronic sinus symptoms.  Reassured patient about safety of sinus rinse if proper technique is practiced using sterile water.

## 2022-05-21 NOTE — Assessment & Plan Note (Signed)
The patient's CT scan, ED workup, and recent unremarkable colonoscopy are reassuring for ruling out concerning etiologies of this pain including, but not limited to, diverticulosis, malignancy, pancreatitis, and pyelonephritis.  The most likely etiology at this time is MSK pain due to muscle tension given the physical exam findings of pain when applying lateral traction to the surface of his LUQ skin.  The patient's chronic back pain and habitus likely predispose him to abdominal muscular problems as well. - Prescribed baclofen 5 mg daily PRN with appropriate precautions not to operate heavy machinery while taking the medication - Recommended abdominal wall stretches such as yoga - Recommended continuing heating pad on abdomen

## 2022-05-21 NOTE — Patient Instructions (Signed)
It was great to see you again today!  Work on stretching Heating pad Baclofen as muscle relaxer  Sinus rinses  Will reach out to radiologist about the renal cysts  Referring to urologist  Follow up with me in 1 month to see how you are doing  Be well, Dr. Ardelia Mems

## 2022-05-21 NOTE — Progress Notes (Addendum)
error 

## 2022-05-21 NOTE — Assessment & Plan Note (Addendum)
The patient's incidental findings of kidney cysts on CT on 8/22 are not symptomatic at this time.  Will reach out to radiology to consult on whether referral to nephrology is appropriate based on the CT findings.  Of note, patient's creatinine was stable at approximately 1.0 at the time of the CT scan and he has no CVA tenderness on exam today.

## 2022-05-21 NOTE — Assessment & Plan Note (Addendum)
Patient has chronic urge incontinence and would likely benefit from a urology follow up.  Referred patient to urology to further discuss his symptoms.

## 2022-05-22 NOTE — Progress Notes (Signed)
SUBJECTIVE:   CHIEF COMPLAINT / HPI:  [name] is a 56 y.o. male with a past medical history of HTN, obesity, paroxysmal Afib, gout, migraines, urinary incontinence, and degenerative disc disease presenting to the clinic for follow up of ED visit on 8/22 for LUQ abdominal pain of unclear etiology with incidental finding of UTI.  LUQ abdominal pain The patient was seen in the ED on 8/22 for LUQ pain.  Following abdominal CT and labs, workup was unremarkable for abdominal pain etiologies.  This pain has been going on since August and is intermittent, occurring a few times per day and lasting an hour or so.  He describes this pain as sharp and he has not taken any medications for it.  He states that the pain is relieved when he holds the LUQ area with his hand for a few minutes.  The pain is not postprandial and not associated with nausea/vomiting/or diarrhea.  The patient is having bowel movements regular twice a day without straining, hematochezia, or bloating.  He takes metamucil to stay regular.  Notably, the patient has been having left-sided lumbar pain and has a history of degenerative disk disease of his lumbar spine for several years.  He is seen by a back specialist and has not done physical therapy for the issue.  The pain is exacerbated by sitting (he drives a forklift for work) and occasionally wraps around his body to his left abdomen.  Urinary incontinence UTI follow up The patient was diagnosed with UTI during his ED visit on 8/22.  He was treated with Keflex and finished his antibiotic course.  He currently has occasional burning with urination "when he does not drink enough water" at work moving freight, but denies dysuria, fevers, or urethral discharge.  His urine is often clear colored and he has not noticed any hematuria.  However, the patient has noticed some leakage and urge incontinence for the past several months.  He has begun wearing incontinence pads for the leakage and  states that the bathroom at work "is just too far away."  He has not yet seen urology for this issue.  The patient also had incidental findings of kidney cysts on CT scan in the ED and heard that he should consider connecting with nephrology during his visit.  However, when he called the nephrologist office, they stated that he did not have a referral placed.  Sinusitis and allergic rhinitis The patient mentions that his "whole face feels stopped up" and his fluticasone is not doing enough to relieve the symptoms.  He has been having protracted nasal congestion and discomfort and gets occasional headaches he attributes to his sinuses.  He has not used any treatments for the headaches beyond his fluticasone and Allegra.  Healthcare maintenance Patient refuses flu and other vaccines today because he wants to deal with current medical issues prior to getting vaccines.  Colonoscopy in 2022 was benign and next colonoscopy planned in 2025.  The patient lives with his spouse and has 3 grown children.  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  He does not drink or smoke (quit in 1994).  He does not use any illicit substances.   OBJECTIVE:   BP 128/80   Pulse 64   Temp (!) 97.5 F (36.4 C)   Ht '6\' 2"'$  (1.88 m)   Wt (!) 325 lb 9.6 oz (147.7 kg)   SpO2 98%   BMI 41.80 kg/m    General: Age-appropriate, resting comfortably in  chair, NAD, WNWD, alert and at baseline. HEENT:  Head: Normocephalic, atraumatic.  Eyes: Sclera without injection or icterus. Ears: TMs non-bulging and non-erythematous bilaterally. No erythema of external ear canal.  1 cm abrasion in left external ear canal (with hair trimmer per patient).  No cerumen impaction. Nose: Non-erythematous turbinates. Mild nasal congestion. Mouth/Oral: Clear, no tonsillar exudate. MMM. Cardiovascular: Regular rate and rhythm. Normal S1/S2. No murmurs, rubs, or gallops appreciated. 2+ radial pulses. Pulmonary: Clear bilaterally to ascultation.  No increased WOB, no accessory muscle usage. No wheezes, rales, or crackles. Abdominal: Normoactive bowel sounds.  Tender to light and deep palpation in LUQ.  Tender with gentle manipulation of skin over LUQ.  No rebound or guarding. No masses palpable Skin: Warm and dry. Extremities: No peripheral edema bilaterally.  No cyanosis or clubbing.   ASSESSMENT/PLAN:   Urinary incontinence Patient has chronic urge incontinence and would likely benefit from a urology follow up.  Referred patient to urology to further discuss his symptoms.  Kidney cysts The patient's incidental findings of kidney cysts on CT on 8/22 are not symptomatic at this time.  Will reach out to radiology to consult on whether referral to nephrology is appropriate based on the CT findings.  Of note, patient's creatinine was stable at approximately 1.0 at the time of the CT scan and he has no CVA tenderness on exam today.  Left upper quadrant abdominal pain The patient's CT scan, ED workup, and recent unremarkable colonoscopy are reassuring for ruling out concerning etiologies of this pain including, but not limited to, diverticulosis, malignancy, pancreatitis, and pyelonephritis.  The most likely etiology at this time is MSK pain due to muscle tension given the physical exam findings of pain when applying lateral traction to the surface of his LUQ skin.  The patient's chronic back pain and habitus likely predispose him to abdominal muscular problems as well. - Prescribed baclofen 5 mg daily PRN with appropriate precautions not to operate heavy machinery while taking the medication - Recommended abdominal wall stretches such as yoga - Recommended continuing heating pad on abdomen  Sinusitis Recommended nasal rinses using sterile water to remove upper respiratory congestion and alleviate chronic sinus symptoms.  Reassured patient about safety of sinus rinse if proper technique is practiced using sterile water and saline packets.     Dimitry Mining engineer, [place]    Patient seen along with medical student Dimitry Shitarev. I personally evaluated this patient along with the student, and verified all aspects of the history, physical exam, and medical decision making as documented by the student. I agree with the student's documentation and have made all necessary edits.  Chrisandra Netters, MD  [place]

## 2022-05-28 ENCOUNTER — Telehealth (HOSPITAL_COMMUNITY): Payer: Self-pay

## 2022-05-28 NOTE — Telephone Encounter (Signed)
Patient called in to see what he can take for a cold. He is currently taking Tikosyn. Advised patient he can only take plain Robitussin, plain Mucinex, Zyrtec or Claritin. Decongestant medications such as DM labeled on the bottle are contraindicated with Tikosyn. Communicated with patient and he verbalized understanding.

## 2022-05-31 ENCOUNTER — Other Ambulatory Visit: Payer: Self-pay | Admitting: Family Medicine

## 2022-06-07 ENCOUNTER — Ambulatory Visit (INDEPENDENT_AMBULATORY_CARE_PROVIDER_SITE_OTHER): Payer: 59 | Admitting: Family Medicine

## 2022-06-07 ENCOUNTER — Encounter: Payer: Self-pay | Admitting: Family Medicine

## 2022-06-07 VITALS — BP 126/80 | HR 70 | Ht 74.0 in | Wt 321.4 lb

## 2022-06-07 DIAGNOSIS — S86812A Strain of other muscle(s) and tendon(s) at lower leg level, left leg, initial encounter: Secondary | ICD-10-CM | POA: Diagnosis not present

## 2022-06-07 NOTE — Patient Instructions (Addendum)
It was nice seeing you today!  Take Tylenol every 6 hours for pain. You can also use Voltaren gel 4 times a day as needed for pain.  I am referring you to a sports medicine specialist.  Stay well, Zola Button, MD [place] [phone]  --  Make sure to check out at the front desk before you leave today.  Please arrive at least 15 minutes prior to your scheduled appointments.  If you had blood work today, I will send you a MyChart message or a letter if results are normal. Otherwise, I will give you a call.  If you had a referral placed, they will call you to set up an appointment. Please give [place] a call if you don't hear back in the next 2 weeks.  If you need additional refills before your next appointment, please call your pharmacy first.

## 2022-06-07 NOTE — Progress Notes (Signed)
    SUBJECTIVE:   CHIEF COMPLAINT / HPI:  Chief Complaint  Patient presents with   Leg Pain    LEFT CALF     Patient reports that he woke up with left calf pain 4 days ago.  He did feel a bump to the area but this has resolved.  No known injury, no recent increase in activity.  He works as a Licensed conveyancer.  He is able to ambulate but with difficulty due to pain.  He reports he is adherent to his medications including his apixaban.  Denies swelling.  No prior history of blood clots.  PERTINENT  PMH / PSH: Paroxysmal A-fib, OSA on CPAP  Patient Care Team: [name], MD as PCP - General (Pediatrics) Nahser, Wonda Cheng, MD as PCP - Cardiology (Cardiology)   OBJECTIVE:   BP 126/80   Pulse 70   Ht '6\' 2"'$  (1.88 m)   Wt (!) 321 lb 6 oz (145.8 kg)   SpO2 97%   BMI 41.26 kg/m   Physical Exam Constitutional:      General: He is not in acute distress. Cardiovascular:     Rate and Rhythm: Normal rate.     Pulses:          Dorsalis pedis pulses are 2+ on the right side and 2+ on the left side.  Pulmonary:     Effort: Pulmonary effort is normal. No respiratory distress.  Musculoskeletal:     Comments: No obvious deformity or swelling.  There is mild tenderness to palpation of the left distal calf.  There is no tenderness elicited along the Achilles tendon.  Pain elicited with resisted plantarflexion.  Gait is somewhat antalgic.  He is unable to stand on tiptoes due to pain.  Neurological:     Mental Status: He is alert.         06/07/2022    2:07 PM  Depression screen PHQ 2/9  Decreased Interest 0  Down, Depressed, Hopeless 0  PHQ - 2 Score 0  Altered sleeping 0  Tired, decreased energy 0  Change in appetite 0  Feeling bad or failure about yourself  0  Trouble concentrating 0  Moving slowly or fidgety/restless 0  Suicidal thoughts 0  PHQ-9 Score 0     {Show previous vital signs (optional):23777}    ASSESSMENT/PLAN:   Left calf pain Suspect possible calf  strain versus tendinitis.  Less likely DVT in the absence of swelling and he is fully anticoagulated.  Do not think there is any tendon rupture.  I think he may benefit from sports medicine referral for further ultrasound evaluation. - scheduled acetaminophen + prn diclofenac gel - referral to sports medicine  Return if symptoms worsen or fail to improve.   Zola Button, MD [place]

## 2022-06-16 ENCOUNTER — Other Ambulatory Visit: Payer: Self-pay | Admitting: Family Medicine

## 2022-06-19 ENCOUNTER — Ambulatory Visit: Payer: 59 | Admitting: Family Medicine

## 2022-06-19 ENCOUNTER — Other Ambulatory Visit: Payer: Self-pay | Admitting: Family Medicine

## 2022-06-19 NOTE — Telephone Encounter (Signed)
Already sent rx

## 2022-06-26 ENCOUNTER — Encounter: Payer: Self-pay | Admitting: Family Medicine

## 2022-07-06 ENCOUNTER — Other Ambulatory Visit: Payer: Self-pay | Admitting: Family Medicine

## 2022-07-08 ENCOUNTER — Other Ambulatory Visit: Payer: Self-pay

## 2022-07-09 MED ORDER — FEXOFENADINE HCL 60 MG PO TABS: 60.0000 mg | ORAL_TABLET | Freq: Every day | ORAL | 0 refills | Status: DC

## 2022-07-14 ENCOUNTER — Other Ambulatory Visit: Payer: Self-pay | Admitting: Family Medicine

## 2022-07-15 ENCOUNTER — Other Ambulatory Visit (HOSPITAL_COMMUNITY): Payer: Self-pay | Admitting: *Deleted

## 2022-07-15 ENCOUNTER — Encounter (HOSPITAL_COMMUNITY): Payer: Self-pay | Admitting: Physician Assistant

## 2022-07-15 ENCOUNTER — Ambulatory Visit (HOSPITAL_COMMUNITY)
Admission: RE | Admit: 2022-07-15 | Discharge: 2022-07-15 | Disposition: A | Discharge status: Home / Self Care | Payer: 59 | Source: Ambulatory Visit | Attending: Physician Assistant | Admitting: Physician Assistant

## 2022-07-15 VITALS — BP 116/84 | HR 51 | Ht 74.0 in | Wt 321.0 lb

## 2022-07-15 DIAGNOSIS — Z6841 Body Mass Index (BMI) 40.0 and over, adult: Secondary | ICD-10-CM | POA: Insufficient documentation

## 2022-07-15 DIAGNOSIS — I480 Paroxysmal atrial fibrillation: Secondary | ICD-10-CM | POA: Insufficient documentation

## 2022-07-15 DIAGNOSIS — I10 Essential (primary) hypertension: Secondary | ICD-10-CM | POA: Insufficient documentation

## 2022-07-15 DIAGNOSIS — R001 Bradycardia, unspecified: Secondary | ICD-10-CM | POA: Diagnosis not present

## 2022-07-15 DIAGNOSIS — E669 Obesity, unspecified: Secondary | ICD-10-CM | POA: Insufficient documentation

## 2022-07-15 DIAGNOSIS — R0789 Other chest pain: Secondary | ICD-10-CM | POA: Insufficient documentation

## 2022-07-15 DIAGNOSIS — G4733 Obstructive sleep apnea (adult) (pediatric): Secondary | ICD-10-CM | POA: Insufficient documentation

## 2022-07-15 LAB — BASIC METABOLIC PANEL
Anion gap: 10 (ref 5–15)
BUN: 11 mg/dL (ref 6–20)
CO2: 23 mmol/L (ref 22–32)
Calcium: 9.2 mg/dL (ref 8.9–10.3)
Chloride: 106 mmol/L (ref 98–111)
Creatinine, Ser: 0.95 mg/dL (ref 0.61–1.24)
GFR, Estimated: >60 mL/min (ref >60)
Glucose, Bld: 118 mg/dL — ABNORMAL HIGH (ref 70–99)
Potassium: 3.7 mmol/L (ref 3.5–5.1)
Sodium: 139 mmol/L (ref 135–145)

## 2022-07-15 LAB — MAGNESIUM: Magnesium: 4.9 mg/dL — ABNORMAL HIGH (ref 1.7–2.4)

## 2022-07-15 MED ORDER — METOPROLOL SUCCINATE ER 25 MG PO TB24: ORAL_TABLET | ORAL | Status: DC

## 2022-07-15 MED ORDER — POTASSIUM CHLORIDE CRYS ER 10 MEQ PO TBCR: 10.0000 meq | EXTENDED_RELEASE_TABLET | Freq: Every day | ORAL | 1 refills | Status: DC

## 2022-07-15 MED ORDER — DOFETILIDE 500 MCG PO CAPS: 500.0000 ug | ORAL_CAPSULE | Freq: Two times a day (BID) | ORAL | 2 refills | Status: DC

## 2022-07-15 NOTE — Progress Notes (Signed)
Patient ID: [name], male   DOB: Dec 24, 1965, 56 y.o.   MRN: [id number]     Primary Care Physician: [name], MD Referring Physician: Dr. Rayann Heman, [facility] f/u Cardiologist: Dr. Leonides Cave is a 56 y.o. male with a h/o PAF on  tikosyn. He feels that mostly he is staying in [place]. He did have afib last night after eating a high salt dinner at [facility]. He is in normal rhythm today. He is being compliant with Tikosyn/ eliquis. No bleeding issues.  He is using cpap. He is back at [employer] on the machines with social distancing in place.   F/u in afib clinic, 10/26/29. He asked to be seen today as he will notice fluttering in his chest but his HR will be lower in the 40's. He did notice one episode of afib with HR's in the 150's x 1. He notices more shortness of breath when he has the fluttering/slow HR spells. No change in health. Eye drop Xiidra, added 2 weeks ago for dry eye. Continues on eliquis 5 mg bid for a CHA2DS2VASc score of 1.   F/u in the afib clinic, 5/3,  for f/u ER visit 4/7,  for chest pain. It was not felt to be cardiac in origin. He was in afib in the ER but converted. The  chest pain  was not associated with afib over the preceding days at home with same. Dr. Burt Knack saw on consult in the ER and cleared to go home. He wore a monitor that showed 7% afib burden. He feels the chest pain was from stress dealing with his job. 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 He is not an ablation candidate for his weight at 328 lbs today. He is   back on [facility] for his weight loss efforts. He lost 50 lbs one time doing [employer], but went on vacation and got off [place] at that time.   F/u afib clinic, 12/12/20. He is in SR today. He has ongoing   atypical chest pain. Recent cardiac CT showed no significant coronary disease so doubt coronary origin. He tells me he has recently reduced his omeprazole to once a day and has noted citric food will make it  worse. He has also increased coffee intake. He denies ever having an endoscopy. He does lift heavy weights and another origin of pain may be MS. He often will notice on sitting to standing. He remains on tikosyn and eliquis.   Follow up in the AF clinic 08/27/21. Patient reports that he has done well since his last visit. He did have one episode of afib starting in the evening and lasting into the next day. He denies any bleeding issues on anticoagulation.   F/u in afib clinic, 11/22/21. Pt is here as he developed some lightheadedness with left arm feeling tingly, and he felt that he had some palpitations. Someone at work felt  his pulse and said he had some skips. He has noted at work if he lifts his arm above his head for work related activities he feels a tingle and a pull across his chest. He has been evaluated for atypical chest pain in the ER before without significant findings. A CT score was low and a LHC in 2018 did not show significant CAD.   He states that his HR's were in the 60-80's at work yesterday . He is in SR today and  has no complaints. He states that he has had to pick up another job as his hours have been cut back with his current job.   Follow up in the AF clinic 07/15/22. Patient reports that he has had more frequent afib recently. He states that he has an episode about every other day which can last for several hours. His wife was recently diagnosed with breast cancer and had surgery earlier this week. No bleeding issues on anticoagulation. He denies any dizziness or presyncope.   Today, he denies symptoms of shortness of breath, orthopnea, PND, lower extremity edema, dizziness, presyncope, syncope, or neurologic sequela. Positive for back pain.The patient is tolerating medications without difficulties and is otherwise without complaint today.   Past Medical History:  Diagnosis Date   Allergy    Arthritis    "knees" (01/22/2017)   Cataract    Chronic lower back pain    GERD  (gastroesophageal reflux disease)    Gout    "no flares in awhile" (01/22/2017)   History of MRI of cervical spine 09/25/2010   normal; left shoulder normal as well   Hypertension    Migraine    "~ q 6 months" (01/22/2017)   Morbid obesity ([place])    "I've lost 43# in 15 weeks w/weight watchers" (01/22/2017)   OSA on CPAP    Paroxysmal atrial fibrillation ([place]) 02/2014   chads2vasc score is 1   Past Surgical History:  Procedure Laterality Date   CARDIAC CATHETERIZATION  05/2005   "no blockage"   KNEE ARTHROSCOPY Right 02/2013   LEFT HEART CATH AND CORONARY ANGIOGRAPHY N/A 01/27/2017   Procedure: Left Heart Cath and Coronary Angiography;  Surgeon: Troy Sine, MD;  Location: [facility];  Service: Cardiovascular;  Laterality: N/A;    Current Outpatient Medications  Medication Sig Dispense Refill   atorvastatin (LIPITOR) 20 MG tablet Take 1 tablet (20 mg total) by mouth every other day. 45 tablet 3   baclofen (LIORESAL) 10 MG tablet Take 0.5 tablets (5 mg total) by mouth daily as needed for muscle spasms. 30 each 0   diclofenac Sodium (VOLTAREN) 1 % GEL Apply 2 g topically daily as needed (For pain). 50 g 1   dofetilide (TIKOSYN) 500 MCG capsule Take 1 capsule (500 mcg total) by mouth 2 (two) times daily. 180 capsule 2   ELIQUIS 5 MG TABS tablet Take 1 tablet by mouth twice daily 180 tablet 3   fexofenadine (ALLEGRA) 60 MG tablet Take 1 tablet (60 mg total) by mouth daily. 90 tablet 0   fluticasone (FLONASE) 50 MCG/ACT nasal spray Place 2 sprays into both nostrils daily. 16 g 6   gabapentin (NEURONTIN) 300 MG capsule Take 300 mg by mouth. Not sure how many times a day he is currently taking.     losartan (COZAAR) 50 MG tablet Take 1 tablet by mouth once daily 90 tablet 3   metoprolol succinate (TOPROL-XL) 25 MG 24 hr tablet Take 1/2 (one-half) tablet by mouth once daily 30 tablet 7   MITIGARE 0.6 MG CAPS Take 1 capsule by mouth once daily 90 capsule 0   Multiple Vitamin  (MULTIVITAMIN WITH MINERALS) TABS tablet Take 1 tablet by mouth at bedtime.     Omega-3 Fatty Acids (FISH OIL) 1000 MG CAPS Take 1,000 mg by mouth in the morning.     omeprazole (PRILOSEC) 40 MG capsule TAKE 1 CAPSULE BY MOUTH TWICE DAILY BEFORE A MEAL 180 capsule 3   TURMERIC PO Take 1 capsule  by mouth every other day.      No current facility-administered medications for this encounter.    Allergies  Allergen Reactions   Hydrochlorothiazide-Propranolol [Propranolol-Hctz] Other (See Comments)    Joints lock up   Penicillins Other (See Comments)    Hives Makes joints stiff and unable to move Has patient had a PCN reaction causing immediate rash, facial/tongue/throat swelling, SOB or lightheadedness with hypotension: No Has patient had a PCN reaction causing severe rash involving mucus membranes or skin necrosis: No Has patient had a PCN reaction that required hospitalization No Has patient had a PCN reaction occurring within the last 10 years: No If all of the above answers are "NO", then may proceed with Cephalosporin use.   Hydrochlorothiazide     Social History   Socioeconomic History   Marital status: Married    Spouse name: Not on file   Number of children: 3   Years of education: Not on file   Highest education level: Not on file  Occupational History   Not on file  Tobacco Use   Smoking status: Former    Packs/day: 1.00    Years: 10.00    Total pack years: 10.00    Types: Cigarettes    Quit date: 04/02/1994    Years since quitting: 28.3   Smokeless tobacco: Former    Types: Chew   Tobacco comments:    Former smoker and chew 07/15/22  Vaping Use   Vaping Use: Never used  Substance and Sexual Activity   Alcohol use: No    Alcohol/week: 0.0 standard drinks of alcohol    Comment: "stopped in 1994"   Drug use: Not Currently    Types: Marijuana    Comment: "stopped marijuna in 1994"   Sexual activity: Yes  Other Topics Concern   Not on file  Social History  Narrative   Lives in [place] with spouse.  3 children, all grown.   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   Social Determinants of Radio broadcast assistant Strain: Not on [employer] Insecurity: Not on file  Transportation Needs: Not on file  Physical Activity: Not on file  Stress: Not on file  Social Connections: Not on file  Intimate Partner Violence: Not on file    Family History  Problem Relation Age of Onset   Atrial fibrillation Mother 34       had a stroke   Hypertension Father    Diabetes Father    Colon cancer Neg Hx    Esophageal cancer Neg Hx    Stomach cancer Neg Hx     ROS- All systems are reviewed and negative except as per the HPI above  Physical Exam: Vitals:   07/15/22 0939  BP: 116/84  Pulse: (!) 51  Weight: (!) 145.6 kg  Height: '6\' 2"'$  (1.88 m)     GEN- The patient is a well appearing obese male, alert and oriented x 3 today.   HEENT-head normocephalic, atraumatic, sclera clear, conjunctiva pink, hearing intact, trachea midline. Lungs- Clear to ausculation bilaterally, normal work of breathing Heart- Regular rate and rhythm, bradycardia, no murmurs, rubs or gallops  GI- soft, NT, ND, + BS Extremities- no clubbing, cyanosis, or edema MS- no significant deformity or atrophy Skin- no rash or lesion Psych- euthymic mood, full affect Neuro- strength and sensation are intact   ECG today demonstrates SR with possible sinus exit block? Vent. rate 51 BPM PR interval 188 ms QRS duration  98 ms QT/QTcB 408/376 ms   Assessment and Plan: 1. Paroxysmal afib Patient in [place].  Continue dofetilide 500 mg BID. QT stable. Check bmet/mag today.  We discussed rhythm control options today. He may be a candidate for ablation if he continues to lose weight. Patient would like to pursue watchful waiting for now given his wife's recent diagnosis and surgery.  Continue Toprol 12.5 mg daily PRN for heart racing.  Continue Eliquis 5 mg  BID for a CV score of 1  2. Obesity Body mass index is 41.21 kg/m. Lifestyle modification was discussed and encouraged including regular physical activity and weight reduction. Patient participating in [facility] again, losing weight.   3. HTN Stable, no changes today.   4.  Bradycardia Possible sinus exit block on ECG, patient asymptomatic. He is not taking BB daily. Will refer him to reestablish with EP, former Dr Rayann Heman patient.   5. 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 OSA Encouraged compliance with CPAP therapy.   Follow up to establish care with new EP in 2-3 months. AF clinic in 6 months.    [facility] [address] [place], [place] [address] [phone]

## 2022-07-22 ENCOUNTER — Ambulatory Visit (HOSPITAL_COMMUNITY)
Admission: RE | Admit: 2022-07-22 | Discharge: 2022-07-22 | Disposition: A | Discharge status: Home / Self Care | Payer: 59 | Source: Ambulatory Visit | Attending: Physician Assistant | Admitting: Physician Assistant

## 2022-07-22 DIAGNOSIS — I480 Paroxysmal atrial fibrillation: Secondary | ICD-10-CM | POA: Diagnosis not present

## 2022-07-22 LAB — BASIC METABOLIC PANEL
Anion gap: 10 (ref 5–15)
BUN: 9 mg/dL (ref 6–20)
CO2: 22 mmol/L (ref 22–32)
Calcium: 9.1 mg/dL (ref 8.9–10.3)
Chloride: 106 mmol/L (ref 98–111)
Creatinine, Ser: 0.87 mg/dL (ref 0.61–1.24)
GFR, Estimated: >60 mL/min (ref >60)
Glucose, Bld: 103 mg/dL — ABNORMAL HIGH (ref 70–99)
Potassium: 3.6 mmol/L (ref 3.5–5.1)
Sodium: 138 mmol/L (ref 135–145)

## 2022-07-22 LAB — MAGNESIUM: Magnesium: 2.1 mg/dL (ref 1.7–2.4)

## 2022-07-22 NOTE — Progress Notes (Signed)
Patient returns for ECG and repeat bmet/mag. ECG shows:  SB Vent. rate 57 BPM PR interval 188 ms QRS duration 96 ms QT/QTcB 432/420 ms  Magnesium normalized. ? Lab error. K+ still low at 3.6, he just started supplement a couple days ago. Would recheck in 2-3 weeks.

## 2022-08-14 ENCOUNTER — Ambulatory Visit (HOSPITAL_COMMUNITY)
Admission: RE | Admit: 2022-08-14 | Discharge: 2022-08-14 | Disposition: A | Discharge status: Home / Self Care | Payer: 59 | Source: Ambulatory Visit | Attending: Physician Assistant | Admitting: Physician Assistant

## 2022-08-14 DIAGNOSIS — I480 Paroxysmal atrial fibrillation: Secondary | ICD-10-CM | POA: Diagnosis not present

## 2022-08-14 LAB — BASIC METABOLIC PANEL
Anion gap: 6 (ref 5–15)
BUN: 10 mg/dL (ref 6–20)
CO2: 25 mmol/L (ref 22–32)
Calcium: 9.3 mg/dL (ref 8.9–10.3)
Chloride: 107 mmol/L (ref 98–111)
Creatinine, Ser: 0.92 mg/dL (ref 0.61–1.24)
GFR, Estimated: >60 mL/min (ref >60)
Glucose, Bld: 114 mg/dL — ABNORMAL HIGH (ref 70–99)
Potassium: 3.9 mmol/L (ref 3.5–5.1)
Sodium: 138 mmol/L (ref 135–145)

## 2022-09-03 ENCOUNTER — Other Ambulatory Visit: Payer: Self-pay | Admitting: Family Medicine

## 2022-09-12 ENCOUNTER — Encounter (HOSPITAL_COMMUNITY): Payer: Self-pay | Admitting: *Deleted

## 2022-09-14 ENCOUNTER — Other Ambulatory Visit (HOSPITAL_COMMUNITY): Payer: Self-pay | Admitting: Physician Assistant

## 2022-09-20 ENCOUNTER — Other Ambulatory Visit: Payer: Self-pay

## 2022-09-20 ENCOUNTER — Emergency Department (HOSPITAL_COMMUNITY)
Admission: EM | Admit: 2022-09-20 | Discharge: 2022-09-20 | Disposition: A | Discharge status: Home / Self Care | Payer: 59 | Attending: Emergency Medicine | Admitting: Emergency Medicine

## 2022-09-20 ENCOUNTER — Emergency Department (HOSPITAL_COMMUNITY): Payer: 59

## 2022-09-20 DIAGNOSIS — Z79899 Other long term (current) drug therapy: Secondary | ICD-10-CM | POA: Diagnosis not present

## 2022-09-20 DIAGNOSIS — R0789 Other chest pain: Secondary | ICD-10-CM | POA: Diagnosis present

## 2022-09-20 DIAGNOSIS — Z1152 Encounter for screening for COVID-19: Secondary | ICD-10-CM | POA: Insufficient documentation

## 2022-09-20 DIAGNOSIS — I480 Paroxysmal atrial fibrillation: Secondary | ICD-10-CM | POA: Diagnosis not present

## 2022-09-20 DIAGNOSIS — I10 Essential (primary) hypertension: Secondary | ICD-10-CM | POA: Diagnosis not present

## 2022-09-20 DIAGNOSIS — Z7901 Long term (current) use of anticoagulants: Secondary | ICD-10-CM | POA: Diagnosis not present

## 2022-09-20 DIAGNOSIS — Z7982 Long term (current) use of aspirin: Secondary | ICD-10-CM | POA: Insufficient documentation

## 2022-09-20 DIAGNOSIS — R079 Chest pain, unspecified: Secondary | ICD-10-CM

## 2022-09-20 LAB — BASIC METABOLIC PANEL
Anion gap: 11 (ref 5–15)
BUN: 12 mg/dL (ref 6–20)
CO2: 23 mmol/L (ref 22–32)
Calcium: 9.4 mg/dL (ref 8.9–10.3)
Chloride: 104 mmol/L (ref 98–111)
Creatinine, Ser: 0.93 mg/dL (ref 0.61–1.24)
GFR, Estimated: >60 mL/min (ref >60)
Glucose, Bld: 100 mg/dL — ABNORMAL HIGH (ref 70–99)
Potassium: 4.2 mmol/L (ref 3.5–5.1)
Sodium: 138 mmol/L (ref 135–145)

## 2022-09-20 LAB — CBC
HCT: 41.1 % (ref 39.0–52.0)
Hemoglobin: 13.6 g/dL (ref 13.0–17.0)
MCH: 27.4 pg (ref 26.0–34.0)
MCHC: 33.1 g/dL (ref 30.0–36.0)
MCV: 82.9 fL (ref 80.0–100.0)
Platelets: 229 10*3/uL (ref 150–400)
RBC: 4.96 MIL/uL (ref 4.22–5.81)
RDW: 13.2 % (ref 11.5–15.5)
WBC: 7.3 10*3/uL (ref 4.0–10.5)
nRBC: 0 % (ref 0.0–0.2)

## 2022-09-20 LAB — RESP PANEL BY RT-PCR (RSV, FLU A&B, COVID)  RVPGX2
Influenza A by PCR: NEGATIVE
Influenza B by PCR: NEGATIVE
Resp Syncytial Virus by PCR: NEGATIVE
SARS Coronavirus 2 by RT PCR: NEGATIVE

## 2022-09-20 LAB — TROPONIN I (HIGH SENSITIVITY)
Troponin I (High Sensitivity): 8 ng/L (ref <18)
Troponin I (High Sensitivity): 8 ng/L (ref <18)

## 2022-09-20 MED ORDER — METOPROLOL TARTRATE 5 MG/5ML IV SOLN
5.0000 mg | Freq: Once | INTRAVENOUS | Status: AC
  Administered 2022-09-20: 5 mg via INTRAVENOUS
  Filled 2022-09-20: qty 5

## 2022-09-20 MED ORDER — ACETAMINOPHEN 500 MG PO TABS
1000.0000 mg | ORAL_TABLET | Freq: Once | ORAL | Status: AC
  Administered 2022-09-20: 1000 mg via ORAL
  Filled 2022-09-20: qty 2

## 2022-09-20 MED ORDER — NITROGLYCERIN 0.4 MG SL SUBL
0.4000 mg | SUBLINGUAL_TABLET | SUBLINGUAL | Status: DC | PRN
  Administered 2022-09-20 (×2): 0.4 mg via SUBLINGUAL
  Filled 2022-09-20 (×2): qty 1

## 2022-09-20 MED ORDER — IOHEXOL 350 MG/ML SOLN
75.0000 mL | Freq: Once | INTRAVENOUS | Status: AC | PRN
  Administered 2022-09-20: 75 mL via INTRAVENOUS

## 2022-09-20 NOTE — Consult Note (Addendum)
Cardiology Consultation   Patient ID: [name] MRN: [id number]; DOB: Jan 18, 1966  Admit date: 09/20/2022 Date of Consult: 09/20/2022  PCP:  Wayne Rio, MD   [place] [facility] Providers Cardiologist:  Wayne Moores, MD     Patient Profile:   [name] is a 57 y.o. male with a hx of paroxysmal atrial fibrillation, hypertension, GERD, OSA, HLD, gout who is being seen 09/20/2022 for the evaluation of chest pain at the request of [name].  History of Present Illness:   [name] is a 57 year old male with above medical history who is followed by [name]. Per chart review, patient was first diagnosed with atrial fibrillation in 2015. He had presented to the hospital in rapid afib, and converted to NSR on IV diltiazem. Echocardiogram 03/15/14 showed EF 55-60%, no regional wall motion abnormalities, grade II diastolic dysfunction. He was started on eliquis. Later presented to the ED in 02/2015 complaining of chest pain. Nuclear stress test on 03/05/15 was a normal, low risk study with EF 55%. Started on Multaq. Continued to have atrial fibrillation while on multaq, and was started on tikosyn in 09/2015. Had a syncopal event in 01/2017. Carotid ultrasounds showed 1-39% stenosis in the right and left ICAs. Echocardiogram 01/23/17 showed EF 60-65% with moderate focal basal hypertrophy, grade II diastolic dysfunction. Underwent nuclear stress testing that was high risk. LHC on 01/24/17 showed normal coronary arteries, EF 55-65%. No focal segmental wall motion abnormalities noted.   Patient complained of chest pain in 10/2020. Coronary CT on 10/23/2020 showed a coronary calcium score of 0.384 (59th percentile) with minimal, non-obstructive CAD. Patient was last seen by the afib clinic on 07/15/22 and reported having more frequent episodes of atrial fibrillation. Had episodes about every other day. He remained on dofetilide 500 mg BID. Discussed possible ablation, but patient wished to postpone as  his wife was recently diagnosed with breast cancer.   Patient presented to the ED on 2/16 complaining of left sided chest pain that started around 083 that AM. Associated with SOB, dizziness. Initial BP with EMS was elevated to 202/110 and patient was in atrial fibrillation with HR in the 60s. In the ED, patient was given SL nitroglycerin and metoprolol tartrate IV. Labs in the ED showed Na 138, K 4.2, creatinine 0.93, WBC 7.3, hemoglobin 13.6, platelets 229. hsTn 8>8, Negative for COVID, flu, RSV. CXR showed no active cardiopulmonary disease.   On interview, patient reports that he has a long history of atrial fibrillation. Reports that his atrial fibrillation has been fairly well controlled lately. When he goes into atrial fibrillation, he does feel palpitations. He did have a cold/allergies for the past week, and has been having brief episodes of palpitations since his symptoms began. These episodes have been very brief, and he only feels a quick flutter in his chest. Today, patient was at work when he developed a more sustained episode of palpitations. He felt like his heart was racing, and his watch told him that his HR was in the 120s-130s. He went on to develop left sided chest pain that felt like someone was punching him in the chest. Pain radiated somewhat to his neck. Left arm felt numb, but not in pain. Patient did have some shortness of breath and dizziness. Symptoms improved with SL nitroglycerin, and further improved when HR was well controlled. He is currently chest pain free.   Patient works out at [employer] using the elliptical and [employer], and he will occasionally  have chest pain with exertion. 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 This week, work has been particularly challenging, and he has had some left chest tightness. He has been having this exertional chest pain for several years, and ischemic workup in the past has been negative.  Reports compliance with his  tikosyn and eliquis. Does not smoke cigarettes or use tobacco products. Does take medications for hypertension and his BP has been well controlled at home. Also on medications for his cholesterol.   Past Medical History:  Diagnosis Date   Allergy    Arthritis    "knees" (01/22/2017)   Cataract    Chronic lower back pain    GERD (gastroesophageal reflux disease)    Gout    "no flares in awhile" (01/22/2017)   History of MRI of cervical spine 09/25/2010   normal; left shoulder normal as well   Hypertension    Migraine    "~ q 6 months" (01/22/2017)   Morbid obesity ([place])    "I've lost 43# in 15 weeks w/weight watchers" (01/22/2017)   OSA on CPAP    Paroxysmal atrial fibrillation ([place]) 02/2014   chads2vasc score is 1    Past Surgical History:  Procedure Laterality Date   CARDIAC CATHETERIZATION  05/2005   "no blockage"   KNEE ARTHROSCOPY Right 02/2013   LEFT HEART CATH AND CORONARY ANGIOGRAPHY N/A 01/27/2017   Procedure: Left Heart Cath and Coronary Angiography;  Surgeon: Wayne Sine, MD;  Location: [facility];  Service: Cardiovascular;  Laterality: N/A;     Home Medications:  Prior to Admission medications   Medication Sig Start Date End Date Taking? Authorizing Provider  atorvastatin (LIPITOR) 20 MG tablet Take 1 tablet (20 mg total) by mouth every other day. 03/07/22   Nahser, Wonda Cheng, MD  baclofen (LIORESAL) 10 MG tablet TAKE 1/2 (ONE-HALF) TABLET BY MOUTH ONCE DAILY AS NEEDED FOR MUSCLE SPASM 07/17/22   Wayne Rio, MD  diclofenac Sodium (VOLTAREN) 1 % GEL Apply 2 g topically daily as needed (For pain). 10/31/21   Ganta, Anupa, DO  dofetilide (TIKOSYN) 500 MCG capsule Take 1 capsule (500 mcg total) by mouth 2 (two) times daily. 07/15/22   Fenton, Clint R, PA  ELIQUIS 5 MG TABS tablet Take 1 tablet by mouth twice daily 12/04/21   Wayne Needs, NP  fexofenadine (ALLEGRA) 60 MG tablet Take 1 tablet (60 mg total) by mouth daily. 07/09/22   Wayne Rio, MD   fluticasone (FLONASE) 50 MCG/ACT nasal spray Place 2 sprays into both nostrils daily. 09/14/18   Wayne Rio, MD  gabapentin (NEURONTIN) 300 MG capsule Take 300 mg by mouth. Not sure how many times a day he is currently taking.    [provider]  losartan (COZAAR) 50 MG tablet Take 1 tablet by mouth once daily 06/04/22   Wayne Rio, MD  metoprolol succinate (TOPROL-XL) 25 MG 24 hr tablet Take 1/2 tablet by mouth as needed for A-fib 07/15/22   Fenton, Liberty R, PA  MITIGARE 0.6 MG CAPS Take 1 capsule by mouth once daily 09/04/22   Wayne Rio, MD  Multiple Vitamin (MULTIVITAMIN WITH MINERALS) TABS tablet Take 1 tablet by mouth at bedtime.    [provider]  Omega-3 Fatty Acids (FISH OIL) 1000 MG CAPS Take 1,000 mg by mouth in the morning.    [provider]  omeprazole (PRILOSEC) 40 MG capsule TAKE 1 CAPSULE BY MOUTH TWICE  DAILY BEFORE A MEAL 12/07/21   Thornton Park, MD  potassium chloride (KLOR-CON M) 10 MEQ tablet Take 1 tablet by mouth once daily 09/16/22   Fenton, Clint R, PA  TURMERIC PO Take 1 capsule by mouth every other day.     [provider]    Inpatient Medications: Scheduled Meds:  Continuous Infusions:  PRN Meds: nitroGLYCERIN  Allergies:    Allergies  Allergen Reactions   Hydrochlorothiazide-Propranolol [Propranolol-Hctz] Other (See Comments)    Joints lock up   Penicillins Other (See Comments)    Hives Makes joints stiff and unable to move Has patient had a PCN reaction causing immediate rash, facial/tongue/throat swelling, SOB or lightheadedness with hypotension: No Has patient had a PCN reaction causing severe rash involving mucus membranes or skin necrosis: No Has patient had a PCN reaction that required hospitalization No Has patient had a PCN reaction occurring within the last 10 years: No If all of the above answers are "NO", then may proceed with Cephalosporin use.   Hydrochlorothiazide      Social History:   Social History   Socioeconomic History   Marital status: Married    Spouse name: Not on file   Number of children: 3   Years of education: Not on file   Highest education level: Not on file  Occupational History   Not on file  Tobacco Use   Smoking status: Former    Packs/day: 1.00    Years: 10.00    Total pack years: 10.00    Types: Cigarettes    Quit date: 04/02/1994    Years since quitting: 28.4   Smokeless tobacco: Former    Types: Chew   Tobacco comments:    Former smoker and chew 07/15/22  Vaping Use   Vaping Use: Never used  Substance and Sexual Activity   Alcohol use: No    Alcohol/week: 0.0 standard drinks of alcohol    Comment: "stopped in 1994"   Drug use: Not Currently    Types: Marijuana    Comment: "stopped marijuna in 1994"   Sexual activity: Yes  Other Topics Concern   Not on file  Social History Narrative   Lives in [place] with spouse.  3 children, all grown.   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   All other ROS reviewed and negative.     Physical Exam/Data:   Vitals:   09/20/22 1202 09/20/22 1215 09/20/22 1400 09/20/22 1416  BP: (!) 123/92 110/64 (!) 118/97   Pulse:  67 94   Resp:  (!) 21 (!) 25   Temp:    97.9 F (36.6 C)  TempSrc:    Oral  SpO2:  97% 98%   Weight:      Height:  Intake/Output Summary (Last 24 hours)  at 09/20/2022 1418 Last data filed at 09/20/2022 1417 Gross per 24 hour  Intake --  Output 3000 ml  Net -3000 ml      09/20/2022    9:39 AM 07/15/2022    9:39 AM 06/07/2022    2:08 PM  Last 3 Weights  Weight (lbs) 310 lb 321 lb 321 lb 6 oz  Weight (kg) 140.615 kg 145.605 kg 145.775 kg     Body mass index is 39.8 kg/m.  General:  Well nourished, well developed, in no acute distress. Laying in bed with head elevated  HEENT: normal Neck: no JVD Vascular: Radial pulses 2+ bilaterally Cardiac:  normal S1, S2; irregular rate and rhythm, no murmurs. Chest wall tender to palpation  Lungs:  clear to auscultation bilaterally, no wheezing, rhonchi or rales. Normal WOB on room air   Abd: soft, mildly tender to palpatoin  Ext: no edema in BLE  Musculoskeletal:  No deformities, BUE and BLE strength normal and equal Skin: warm and dry  Neuro:  CNs 2-12 intact, no focal abnormalities noted Psych:  Normal affect   EKG:  The EKG was personally reviewed and demonstrates:  Atrial fibrillation, HR 87 BPM, PVC present  Telemetry:  Telemetry was personally reviewed and demonstrates:  Atrial fibrillation. HR was elevated to the 140s when patient was first roomed in ED, has since come down to the 50s-70s   Relevant CV Studies:   Laboratory Data:  High Sensitivity Troponin:   Recent Labs  Lab 09/20/22 0947 09/20/22 1147  TROPONINIHS 8 8     Chemistry Recent Labs  Lab 09/20/22 0947  NA 138  K 4.2  CL 104  CO2 23  GLUCOSE 100*  BUN 12  CREATININE 0.93  CALCIUM 9.4  GFRNONAA >60  ANIONGAP 11    No results for input(s): "PROT", "ALBUMIN", "AST", "ALT", "ALKPHOS", "BILITOT" in the last 168 hours. Lipids No results for input(s): "CHOL", "TRIG", "HDL", "LABVLDL", "LDLCALC", "CHOLHDL" in the last 168 hours.  Hematology Recent Labs  Lab 09/20/22 0947  WBC 7.3  RBC 4.96  HGB 13.6  HCT 41.1  MCV 82.9  MCH 27.4  MCHC 33.1  RDW 13.2  PLT 229   Thyroid No results for input(s): "TSH",  "FREET4" in the last 168 hours.  BNPNo results for input(s): "BNP", "PROBNP" in the last 168 hours.  DDimer No results for input(s): "DDIMER" in the last 168 hours.   Radiology/Studies:  CT Angio Chest PE W and/or Wo Contrast  Result Date: 09/20/2022 CLINICAL DATA:  57 year old male with left side chest pain. Shortness of breath and some dizziness. Atrial fibrillation, hypertensive. EXAM: CT ANGIOGRAPHY CHEST WITH CONTRAST TECHNIQUE: Multidetector CT imaging of the chest was performed using the standard protocol during bolus administration of intravenous contrast. Multiplanar CT image reconstructions and MIPs were obtained to evaluate the vascular anatomy. RADIATION DOSE REDUCTION: This exam was performed according to the departmental dose-optimization program which includes automated exposure control, adjustment of the mA and/or kV according to patient size and/or use of iterative reconstruction technique. CONTRAST:  40m OMNIPAQUE IOHEXOL 350 MG/ML SOLN COMPARISON:  Chest radiographs 1019 hours today. Chest CTA 10/12/2014. CT Abdomen and Pelvis 05/19/2019 FINDINGS: Cardiovascular: Good contrast bolus timing in the pulmonary arterial tree. Minor respiratory motion. No pulmonary artery filling defect. Little to no contrast in the aorta. Cardiac size is stable since 2016, within normal limits. No pericardial effusion. Faint calcified left coronary artery atherosclerosis evident on series 7, image 175. Mediastinum/Nodes:  Negative. No mediastinal mass or lymphadenopathy. Lungs/Pleura: Similar lung volumes to 2016. Major airways remain patent with mild atelectatic changes throughout. And mild dependent atelectasis in both lungs. Mild chronic scarring in the left lateral costophrenic angle is stable. No pleural effusion or acute pulmonary other acute pulmonary process. Upper Abdomen: Negative visible noncontrast liver, gallbladder, spleen, pancreas, adrenal glands. Negative visible bowel. Stable visible kidneys.  Musculoskeletal: Occasional mild thoracic endplate Schmorl's nodes. Probable benign bone island of the posterior right T9 body. No acute or suspicious osseous lesion identified. Review of the MIP images confirms the above findings. IMPRESSION: 1. Negative for acute pulmonary embolus. 2. Mild pulmonary atelectasis. Electronically Signed   By: Genevie Ann M.D.   On: 09/20/2022 13:12   DG Chest 2 View  Result Date: 09/20/2022 CLINICAL DATA:  Left-sided chest pain. EXAM: CHEST - 2 VIEW COMPARISON:  Chest x-ray dated February 21, 2021. FINDINGS: The heart size and mediastinal contours are within normal limits. Both lungs are clear. The visualized skeletal structures are unremarkable. IMPRESSION: No active cardiopulmonary disease. Electronically Signed   By: Titus Dubin M.D.   On: 09/20/2022 10:37     Assessment and Plan:   Paroxysmal Atrial Fibrillation  - Patient presented in afib with RVR, HR was elevated to the 140s. Was given IV metoprolol 5 mg in the ED and HR has improved to the 50s-70s - He remains on tikosyn 500 mcg BID. Also on eliquis 5 mg BID. Denies missing any doses of either medication recently  - Patient previously has been on metoprolol succinate 12.5 mg PRN for atrial fibrillation. Discussed having patient take metoprolol daily as he has been having increased episodes of palpitations recently, but patient prefers to continue to take PRN.  - Followed by the afib clinic- has discussed ablation in the past. Weight has been in issue in the past, but weight is down from 330 lbs to 310 lbs today. 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 Does report that he continues to have this pain when working at the dock, especially when work is busy. However, patient goes to the gym regularly  and can use the elliptical and lift weights without significant chest pain.  - Today, patient developed chest discomfort when his HR was elevated. He first felt palpitations, then had chest pain that felt "sharp" and like "someone was punching him in the chest" - hsTn negative x2 in the ED - Patient had a coronary CT on 10/23/2020 that showed minimal, nonobstructive CAD. Does have tenderness to palpation over the left chest wall on exam  - Suspect that today's episode of chest pain is symptomatic atrial fibrillation as chest pain occurred when HR was elevated and was relieved when HR improved.  - Management of atrial fibrillation as above  HTN  - BP was elevated to 157/108 in the ED, but has since normalized - Patient does record his BP every morning at home and reports that BP has been very well controlled  - Continue current therapies    Risk Assessment/Risk Scores:   CHA2DS2-VASc Score = 1  This indicates a 0.6% annual risk of stroke. The patient's score is based upon: CHF History: 0 HTN History: 1 Diabetes History: 0 Stroke History: 0  Vascular Disease History: 0 Age Score: 0 Gender Score: 0          For questions or updates, please contact [place] Please consult [email or web address] for contact info under    Signed, Margie Billet, PA-C  09/20/2022 2:18 PM  Patient seen, examined. Available data reviewed. Agree with findings, assessment, and plan as outlined by Vikki Ports, PA-C.  The patient is independently interviewed and examined.  He is alert, oriented, obese male in no distress.  HEENT is normal, JVP is normal, there are normal carotid upstrokes, lungs clear to auscultation bilaterally, heart irregularly irregular with no murmur or gallop, abdomen soft and obese with no palpable masses, extremities with trace pretibial edema, skin warm and dry with no rash.  Serial troponins are 8 and 8.  CT angiogram of the chest shows no pulmonary embolus or other  acute findings.  EKG shows atrial fibrillation 85 bpm with no ST or T wave changes.  The patient's episode this morning may have been related to atrial fibrillation with RVR.  His heart rate is now well-controlled.  He also has an atypical chest discomfort that he can palpate in the left side of the chest.  He has undergone ischemic evaluation in the past with a Cardiac catheterization in 2018 demonstrating normal coronary arteries and a gated coronary CTA in 2022 demonstrating minimal nonobstructive coronary plaquing.  Will arrange close outpatient follow-up in the atrial fibrillation clinic.  The patient has lost about 50 pounds and it is possible that he could be a candidate for A-fib ablation if he continues to lose weight.  He will use metoprolol as needed.  He was reluctant to take scheduled metoprolol because of previous bradycardia.  He also states that over time it has not helped with his atrial fibrillation.  He will remain on Tikosyn at his current dose.  The patient's potassium is at 4.2.  He otherwise appears stable for discharge from the emergency room.  Sherren Mocha, M.D. 09/20/2022 3:30 PM

## 2022-09-20 NOTE — ED Provider Notes (Incomplete)
[facility] Provider Note   CSN: [id number] Arrival date & time: 09/20/22  0932     History {Add pertinent medical, surgical, social history, OB history to HPI:1} Chief Complaint  Patient presents with  . Chest Pain    [name] is a 57 y.o. male.  HPI   57 yo male ho paroxysmal a fib presents today with sharp left side chest pain  Home Medications Prior to Admission medications   Medication Sig Start Date End Date Taking? Authorizing Provider  atorvastatin (LIPITOR) 20 MG tablet Take 1 tablet (20 mg total) by mouth every other day. 03/07/22   Nahser, Wonda Cheng, MD  baclofen (LIORESAL) 10 MG tablet TAKE 1/2 (ONE-HALF) TABLET BY MOUTH ONCE DAILY AS NEEDED FOR MUSCLE SPASM 07/17/22   [name], MD  diclofenac Sodium (VOLTAREN) 1 % GEL Apply 2 g topically daily as needed (For pain). 10/31/21   Ganta, Anupa, DO  dofetilide (TIKOSYN) 500 MCG capsule Take 1 capsule (500 mcg total) by mouth 2 (two) times daily. 07/15/22   Fenton, Clint R, PA  ELIQUIS 5 MG TABS tablet Take 1 tablet by mouth twice daily 12/04/21   Sherran Needs, NP  fexofenadine (ALLEGRA) 60 MG tablet Take 1 tablet (60 mg total) by mouth daily. 07/09/22   [name], MD  fluticasone (FLONASE) 50 MCG/ACT nasal spray Place 2 sprays into both nostrils daily. 09/14/18   [name], MD  gabapentin (NEURONTIN) 300 MG capsule Take 300 mg by mouth. Not sure how many times a day he is currently taking.    [provider]  losartan (COZAAR) 50 MG tablet Take 1 tablet by mouth once daily 06/04/22   [name], MD  metoprolol succinate (TOPROL-XL) 25 MG 24 hr tablet Take 1/2 tablet by mouth as needed for A-fib 07/15/22   Fenton, [place] R, PA  MITIGARE 0.6 MG CAPS Take 1 capsule by mouth once daily 09/04/22   [name], MD  Multiple Vitamin (MULTIVITAMIN WITH MINERALS) TABS tablet Take 1 tablet by mouth at bedtime.    [provider]  Omega-3 Fatty Acids (FISH OIL) 1000 MG CAPS Take 1,000 mg by mouth in the morning.    [provider]  omeprazole (PRILOSEC) 40 MG capsule TAKE 1 CAPSULE BY MOUTH TWICE DAILY BEFORE A MEAL 12/07/21   Thornton Park, MD  potassium chloride (KLOR-CON M) 10 MEQ tablet Take 1 tablet by mouth once daily 09/16/22   Fenton, Clint R, PA  TURMERIC PO Take 1 capsule by mouth every other day.     [provider]      Allergies    Hydrochlorothiazide-propranolol [propranolol-hctz], Penicillins, and Hydrochlorothiazide    Review of Systems   Review of Systems  Physical Exam Updated Vital Signs BP (!) 157/108   Pulse 90   Temp 97.8 F (36.6 C) (Oral)   Resp 20   Ht 1.88 m (6' 2"$ )   Wt (!) 140.6 kg   SpO2 100%   BMI 39.80 kg/m  Physical Exam  ED Results / Procedures / Treatments   Labs (all labs ordered are listed, but only abnormal results are displayed) Labs Reviewed  BASIC METABOLIC PANEL - Abnormal; Notable for the following components:      Result Value   Glucose, Bld 100 (*)    All other components within normal limits  CBC  TROPONIN I (HIGH SENSITIVITY)    EKG EKG Interpretation  Date/Time:  Friday  September 20 2022 09:43:53 EST Ventricular Rate:  87 PR Interval:    QRS Duration: 95 QT Interval:  381 QTC Calculation: 459 R Axis:   4 Text Interpretation: Atrial fibrillation Ventricular premature complex Confirmed by Pattricia Boss [phone]) on 09/20/2022 10:09:00 AM  Radiology DG Chest 2 View  Result Date: 09/20/2022 CLINICAL DATA:  Left-sided chest pain. EXAM: CHEST - 2 VIEW COMPARISON:  Chest x-Dianna Deshler dated February 21, 2021. FINDINGS: The heart size and mediastinal contours are within normal limits. Both lungs are clear. The visualized skeletal structures are unremarkable. IMPRESSION: No active cardiopulmonary disease. Electronically Signed   By: Titus Dubin M.D.   On: 09/20/2022 10:37    Procedures Procedures  {Document cardiac monitor,  telemetry assessment procedure when appropriate:1}  Medications Ordered in ED Medications - No data to display  ED Course/ Medical Decision Making/ A&P   {   Click here for ABCD2, HEART and other calculatorsREFRESH Note before signing :1}                          Medical Decision Making Amount and/or Complexity of Data Reviewed Labs: ordered. Radiology: ordered.   ***  {Document critical care time when appropriate:1} {Document review of labs and clinical decision tools ie heart score, Chads2Vasc2 etc:1}  {Document your independent review of radiology images, and any outside records:1} {Document your discussion with family members, caretakers, and with consultants:1} {Document social determinants of health affecting pt's care:1} {Document your decision making why or why not admission, treatments were needed:1} Final Clinical Impression(s) / ED Diagnoses Final diagnoses:  None    Rx / DC Orders ED Discharge Orders     None

## 2022-09-20 NOTE — Discharge Instructions (Signed)
Please continue your metoprolol until followed up at A-fib clinic Please continue your other medications Return if you are having worsening symptoms especially shortness of breath, worsening pain, or other new symptoms.

## 2022-09-20 NOTE — ED Triage Notes (Signed)
Pt BIB EMS from work with a chief complaint of sharp left sided chest pain that started around 0830 this AM. Pt reports some numbness and tingling in the left arm. Patient denies nausea, vomiting, or headache. Pt endorses shortness of breath and some dizziness. 6/10 chest pain post intervention.  EMS Given 324 ASA 0.1 nitro tablet A-fib rate of 66 202/110 initial BP, after nitro 148/100 98% on RA 20 RR  20# R AC

## 2022-09-20 NOTE — ED Notes (Signed)
Discharge instructions reviewed with patient at bedside. Patient denies any questions or concerns at this time. Patient verbalized understanding for follow-up care. Patient ambulatory to lobby with friend.

## 2022-09-20 NOTE — ED Provider Notes (Signed)
[facility] Provider Note   CSN: [id number] Arrival date & time: 09/20/22  0932     History  Chief Complaint  Patient presents with   Chest Pain    [name] is a 57 y.o. male.  HPI 57 yo male ho paroxysmal afib noted palpitations and sharp pain to left anterior chest began at 0800.  Pain continues. Patient took tikosyn this am., eliquis taken today, metoprolol rx prn not taken today.  Patient was at work ems asa and nitro- 9/10 went down to 6/10 now 9/10.  Some dyspnea and lightheadedness.  Patient now on oxygen and felt dyspneic. Risk hypercholesteremia,hypertension, pafib,      Home Medications Prior to Admission medications   Medication Sig Start Date End Date Taking? Authorizing Provider  atorvastatin (LIPITOR) 20 MG tablet Take 1 tablet (20 mg total) by mouth every other day. 03/07/22   Nahser, Wonda Cheng, MD  baclofen (LIORESAL) 10 MG tablet TAKE 1/2 (ONE-HALF) TABLET BY MOUTH ONCE DAILY AS NEEDED FOR MUSCLE SPASM 07/17/22   [name], MD  diclofenac Sodium (VOLTAREN) 1 % GEL Apply 2 g topically daily as needed (For pain). 10/31/21   Ganta, Anupa, DO  dofetilide (TIKOSYN) 500 MCG capsule Take 1 capsule (500 mcg total) by mouth 2 (two) times daily. 07/15/22   Fenton, Clint R, PA  ELIQUIS 5 MG TABS tablet Take 1 tablet by mouth twice daily 12/04/21   Sherran Needs, NP  fexofenadine (ALLEGRA) 60 MG tablet Take 1 tablet (60 mg total) by mouth daily. 07/09/22   [name], MD  fluticasone (FLONASE) 50 MCG/ACT nasal spray Place 2 sprays into both nostrils daily. 09/14/18   [name], MD  gabapentin (NEURONTIN) 300 MG capsule Take 300 mg by mouth. Not sure how many times a day he is currently taking.    [provider]  losartan (COZAAR) 50 MG tablet Take 1 tablet by mouth once daily 06/04/22   [name], MD  metoprolol succinate (TOPROL-XL) 25 MG 24 hr tablet Take 1/2 tablet by mouth as  needed for A-fib 07/15/22   Fenton, Oxford R, PA  MITIGARE 0.6 MG CAPS Take 1 capsule by mouth once daily 09/04/22   [name], MD  Multiple Vitamin (MULTIVITAMIN WITH MINERALS) TABS tablet Take 1 tablet by mouth at bedtime.    [provider]  Omega-3 Fatty Acids (FISH OIL) 1000 MG CAPS Take 1,000 mg by mouth in the morning.    [provider]  omeprazole (PRILOSEC) 40 MG capsule TAKE 1 CAPSULE BY MOUTH TWICE DAILY BEFORE A MEAL 12/07/21   Thornton Park, MD  potassium chloride (KLOR-CON M) 10 MEQ tablet Take 1 tablet by mouth once daily 09/16/22   Fenton, Clint R, PA  TURMERIC PO Take 1 capsule by mouth every other day.     [provider]      Allergies    Hydrochlorothiazide-propranolol [propranolol-hctz], Penicillins, and Hydrochlorothiazide    Review of Systems   Review of Systems  Physical Exam Updated Vital Signs BP (!) 118/97   Pulse 94   Temp 97.9 F (36.6 C) (Oral)   Resp (!) 25   Ht 1.88 m (6' 2"$ )   Wt (!) 140.6 kg   SpO2 98%   BMI 39.80 kg/m  Physical Exam Vitals and nursing note reviewed.  Constitutional:      Appearance: He is obese.  HENT:     Head: Normocephalic.  Eyes:  Pupils: Pupils are equal, round, and reactive to light.  Cardiovascular:     Rate and Rhythm: Tachycardia present. Rhythm irregular.     Heart sounds: Normal heart sounds.  Pulmonary:     Effort: Pulmonary effort is normal.     Breath sounds: Normal breath sounds.  Abdominal:     General: Bowel sounds are normal.     Palpations: Abdomen is soft.  Musculoskeletal:        General: Normal range of motion.     Cervical back: Normal range of motion.  Skin:    General: Skin is warm.     Capillary Refill: Capillary refill takes less than 2 seconds.  Neurological:     General: No focal deficit present.     Mental Status: He is alert.     ED Results / Procedures / Treatments   Labs (all labs ordered are listed, but only abnormal results are  displayed) Labs Reviewed  BASIC METABOLIC PANEL - Abnormal; Notable for the following components:      Result Value   Glucose, Bld 100 (*)    All other components within normal limits  RESP PANEL BY RT-PCR (RSV, FLU A&B, COVID)  RVPGX2  CBC  TROPONIN I (HIGH SENSITIVITY)  TROPONIN I (HIGH SENSITIVITY)    EKG EKG Interpretation  Date/Time:  Friday September 20 2022 09:43:53 EST Ventricular Rate:  87 PR Interval:    QRS Duration: 95 QT Interval:  381 QTC Calculation: 459 R Axis:   4 Text Interpretation: Atrial fibrillation Ventricular premature complex Confirmed by Pattricia Boss [phone]) on 09/20/2022 10:09:00 AM  Radiology CT Angio Chest PE W and/or Wo Contrast  Result Date: 09/20/2022 CLINICAL DATA:  57 year old male with left side chest pain. Shortness of breath and some dizziness. Atrial fibrillation, hypertensive. EXAM: CT ANGIOGRAPHY CHEST WITH CONTRAST TECHNIQUE: Multidetector CT imaging of the chest was performed using the standard protocol during bolus administration of intravenous contrast. Multiplanar CT image reconstructions and MIPs were obtained to evaluate the vascular anatomy. RADIATION DOSE REDUCTION: This exam was performed according to the departmental dose-optimization program which includes automated exposure control, adjustment of the mA and/or kV according to patient size and/or use of iterative reconstruction technique. CONTRAST:  17m OMNIPAQUE IOHEXOL 350 MG/ML SOLN COMPARISON:  Chest radiographs 1019 hours today. Chest CTA 10/12/2014. CT Abdomen and Pelvis 05/19/2019 FINDINGS: Cardiovascular: Good contrast bolus timing in the pulmonary arterial tree. Minor respiratory motion. No pulmonary artery filling defect. Little to no contrast in the aorta. Cardiac size is stable since 2016, within normal limits. No pericardial effusion. Faint calcified left coronary artery atherosclerosis evident on series 7, image 175. Mediastinum/Nodes: Negative. No mediastinal mass or  lymphadenopathy. Lungs/Pleura: Similar lung volumes to 2016. Major airways remain patent with mild atelectatic changes throughout. And mild dependent atelectasis in both lungs. Mild chronic scarring in the left lateral costophrenic angle is stable. No pleural effusion or acute pulmonary other acute pulmonary process. Upper Abdomen: Negative visible noncontrast liver, gallbladder, spleen, pancreas, adrenal glands. Negative visible bowel. Stable visible kidneys. Musculoskeletal: Occasional mild thoracic endplate Schmorl's nodes. Probable benign bone island of the posterior right T9 body. No acute or suspicious osseous lesion identified. Review of the MIP images confirms the above findings. IMPRESSION: 1. Negative for acute pulmonary embolus. 2. Mild pulmonary atelectasis. Electronically Signed   By: HGenevie AnnM.D.   On: 09/20/2022 13:12   DG Chest 2 View  Result Date: 09/20/2022 CLINICAL DATA:  Left-sided chest pain. EXAM: CHEST - 2 VIEW COMPARISON:  Chest x-Melisia Leming dated February 21, 2021. FINDINGS: The heart size and mediastinal contours are within normal limits. Both lungs are clear. The visualized skeletal structures are unremarkable. IMPRESSION: No active cardiopulmonary disease. Electronically Signed   By: Titus Dubin M.D.   On: 09/20/2022 10:37    Procedures .Critical Care  Performed by: Pattricia Boss, MD Authorized by: Pattricia Boss, MD   Critical care provider statement:    Critical care time (minutes):  30   Critical care was time spent personally by me on the following activities:  Development of treatment plan with patient or surrogate, discussions with consultants, evaluation of patient's response to treatment, examination of patient, ordering and review of laboratory studies, ordering and review of radiographic studies, ordering and performing treatments and interventions, pulse oximetry, re-evaluation of patient's condition and review of old charts     Medications Ordered in ED Medications   nitroGLYCERIN (NITROSTAT) SL tablet 0.4 mg (0.4 mg Sublingual Given 09/20/22 1203)  acetaminophen (TYLENOL) tablet 1,000 mg (has no administration in time range)  metoprolol tartrate (LOPRESSOR) injection 5 mg (5 mg Intravenous Given 09/20/22 1135)  iohexol (OMNIPAQUE) 350 MG/ML injection 75 mL (75 mLs Intravenous Contrast Given 09/20/22 1304)    ED Course/ Medical Decision Making/ A&P Clinical Course as of 09/20/22 1441  Fri Sep 20, 2022  1323 CT angio chest is negative for acute pulmonary embolism with mild pulmonary atelectasis per radiologist interpretation [DR]  1324 Troponin and repeat troponin are normal at 8 [DR]  1324 COVID swab is negative CBC within normal limits Basic metabolic panel within normal limits [DR]    Clinical Course User Index [DR] Pattricia Boss, MD                             Medical Decision Making Amount and/or Complexity of Data Reviewed Labs: ordered. Radiology: ordered.  Risk OTC drugs. Prescription drug management.   57 year old male history of paroxysmal atrial fibrillation normally takes Eliquis alert and having palpitations very consistent with his A-fib and simultaneously had sharp left-sided anterior chest pain.  Patient received nitro and aspirin prehospital.  Some pain relief with nitro but returned and pain is again at 9 out of 10.  He has had some dyspnea. Will obtain cta due to sharp pain and dyspnea.  Patient on eliquis  Cardiology consulted due to pain Differential diagnosis includes but is not limited to myocardial infarction, ACS, pain from demand ischemia with A-fib, PE Patient without acute ST changes although is in A-fib and has normal troponin which makes myocardial infarction less likely CTA without evidence of PE Discussed with Dr. Burt Knack and advises patient appropriate for d/c with follow up at [facility] Discussed with patient and he voices understanding of plan and return precautions       Final Clinical Impression(s) /  ED Diagnoses Final diagnoses:  Chest pain, unspecified type  Paroxysmal atrial fibrillation [facility])    Rx / DC Orders ED Discharge Orders     None         Pattricia Boss, MD 09/20/22 1441    Pattricia Boss, MD 09/21/22 1529

## 2022-09-24 ENCOUNTER — Ambulatory Visit (HOSPITAL_COMMUNITY)
Admit: 2022-09-24 | Discharge: 2022-09-24 | Disposition: A | Discharge status: Home / Self Care | Payer: 59 | Attending: Physician Assistant | Admitting: Physician Assistant

## 2022-09-24 ENCOUNTER — Encounter (HOSPITAL_COMMUNITY): Payer: Self-pay | Admitting: Physician Assistant

## 2022-09-24 VITALS — BP 144/76 | HR 56 | Ht 74.0 in | Wt 312.8 lb

## 2022-09-24 DIAGNOSIS — I10 Essential (primary) hypertension: Secondary | ICD-10-CM | POA: Insufficient documentation

## 2022-09-24 DIAGNOSIS — Z87891 Personal history of nicotine dependence: Secondary | ICD-10-CM | POA: Diagnosis not present

## 2022-09-24 DIAGNOSIS — R001 Bradycardia, unspecified: Secondary | ICD-10-CM | POA: Insufficient documentation

## 2022-09-24 DIAGNOSIS — R0789 Other chest pain: Secondary | ICD-10-CM | POA: Insufficient documentation

## 2022-09-24 DIAGNOSIS — E669 Obesity, unspecified: Secondary | ICD-10-CM | POA: Diagnosis not present

## 2022-09-24 DIAGNOSIS — G4733 Obstructive sleep apnea (adult) (pediatric): Secondary | ICD-10-CM | POA: Insufficient documentation

## 2022-09-24 DIAGNOSIS — I480 Paroxysmal atrial fibrillation: Secondary | ICD-10-CM | POA: Insufficient documentation

## 2022-09-24 DIAGNOSIS — Z6841 Body Mass Index (BMI) 40.0 and over, adult: Secondary | ICD-10-CM | POA: Insufficient documentation

## 2022-09-24 DIAGNOSIS — Z7901 Long term (current) use of anticoagulants: Secondary | ICD-10-CM | POA: Insufficient documentation

## 2022-09-24 DIAGNOSIS — Z79899 Other long term (current) drug therapy: Secondary | ICD-10-CM | POA: Insufficient documentation

## 2022-09-24 NOTE — Progress Notes (Signed)
Patient ID: [name], male   DOB: Dec 24, 1965, 57 y.o.   MRN: [id number]     Primary Care Physician: [name], MD Referring Physician: Dr. Rayann Heman, [facility] f/u Cardiologist: Dr. Leonides Cave is a 57 y.o. male with a h/o PAF on  tikosyn. He feels that mostly he is staying in [place]. He did have afib last night after eating a high salt dinner at [facility]. He is in normal rhythm today. He is being compliant with Tikosyn/ eliquis. No bleeding issues.  He is using cpap. He is back at [employer] on the machines with social distancing in place.   F/u in afib clinic, 10/26/29. He asked to be seen today as he will notice fluttering in his chest but his HR will be lower in the 40's. He did notice one episode of afib with HR's in the 150's x 1. He notices more shortness of breath when he has the fluttering/slow HR spells. No change in health. Eye drop Xiidra, added 2 weeks ago for dry eye. Continues on eliquis 5 mg bid for a CHA2DS2VASc score of 1.   F/u in the afib clinic, 5/3,  for f/u ER visit 4/7,  for chest pain. It was not felt to be cardiac in origin. He was in afib in the ER but converted. The  chest pain  was not associated with afib over the preceding days at home with same. Dr. Burt Knack saw on consult in the ER and cleared to go home. He wore a monitor that showed 7% afib burden. He feels the chest pain was from stress dealing with his job. 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 He is not an ablation candidate for his weight at 328 lbs today. He is   back on [facility] for his weight loss efforts. He lost 50 lbs one time doing [employer], but went on vacation and got off [place] at that time.   F/u afib clinic, 12/12/20. He is in SR today. He has ongoing   atypical chest pain. Recent cardiac CT showed no significant coronary disease so doubt coronary origin. He tells me he has recently reduced his omeprazole to once a day and has noted citric food will make it  worse. He has also increased coffee intake. He denies ever having an endoscopy. He does lift heavy weights and another origin of pain may be MS. He often will notice on sitting to standing. He remains on tikosyn and eliquis.   Follow up in the AF clinic 08/27/21. Patient reports that he has done well since his last visit. He did have one episode of afib starting in the evening and lasting into the next day. He denies any bleeding issues on anticoagulation.   F/u in afib clinic, 11/22/21. Pt is here as he developed some lightheadedness with left arm feeling tingly, and he felt that he had some palpitations. Someone at work felt  his pulse and said he had some skips. He has noted at work if he lifts his arm above his head for work related activities he feels a tingle and a pull across his chest. He has been evaluated for atypical chest pain in the ER before without significant findings. A CT score was low and a LHC in 2018 did not show significant CAD.   He states that his HR's were in the 60-80's at work yesterday . He is in SR today and  has no complaints. He states that he has had to pick up another job as his hours have been cut back with his current job.   Follow up in the AF clinic 07/15/22. Patient reports that he has had more frequent afib recently. He states that he has an episode about every other day which can last for several hours. His wife was recently diagnosed with breast cancer and had surgery earlier this week. No bleeding issues on anticoagulation. He denies any dizziness or presyncope.   Follow up in the AF clinic 09/24/22. Patient was seen in the ED 09/20/22 with afib and chest pain. His symptoms improved with rate control. He converted back to SR after discharge and feels well today. He has been dealing with a sinus infection recently.   Today, he denies symptoms of palpitations, shortness of breath, orthopnea, PND, lower extremity edema, dizziness, presyncope, syncope, or neurologic  sequela. Positive for back pain.The patient is tolerating medications without difficulties and is otherwise without complaint today.   Past Medical History:  Diagnosis Date   Allergy    Arthritis    "knees" (01/22/2017)   Cataract    Chronic lower back pain    GERD (gastroesophageal reflux disease)    Gout    "no flares in awhile" (01/22/2017)   History of MRI of cervical spine 09/25/2010   normal; left shoulder normal as well   Hypertension    Migraine    "~ q 6 months" (01/22/2017)   Morbid obesity ([place])    "I've lost 43# in 15 weeks w/weight watchers" (01/22/2017)   OSA on CPAP    Paroxysmal atrial fibrillation ([place]) 02/2014   chads2vasc score is 1   Past Surgical History:  Procedure Laterality Date   CARDIAC CATHETERIZATION  05/2005   "no blockage"   KNEE ARTHROSCOPY Right 02/2013   LEFT HEART CATH AND CORONARY ANGIOGRAPHY N/A 01/27/2017   Procedure: Left Heart Cath and Coronary Angiography;  Surgeon: Troy Sine, MD;  Location: [facility];  Service: Cardiovascular;  Laterality: N/A;    Current Outpatient Medications  Medication Sig Dispense Refill   atorvastatin (LIPITOR) 20 MG tablet Take 1 tablet (20 mg total) by mouth every other day. 45 tablet 3   baclofen (LIORESAL) 10 MG tablet TAKE 1/2 (ONE-HALF) TABLET BY MOUTH ONCE DAILY AS NEEDED FOR MUSCLE SPASM 30 tablet 0   diclofenac Sodium (VOLTAREN) 1 % GEL Apply 2 g topically daily as needed (For pain). 50 g 1   dofetilide (TIKOSYN) 500 MCG capsule Take 1 capsule (500 mcg total) by mouth 2 (two) times daily. 180 capsule 2   ELIQUIS 5 MG TABS tablet Take 1 tablet by mouth twice daily 180 tablet 3   fexofenadine (ALLEGRA) 60 MG tablet Take 1 tablet (60 mg total) by mouth daily. 90 tablet 0   fluticasone (FLONASE) 50 MCG/ACT nasal spray Place 2 sprays into both nostrils daily. 16 g 6   gabapentin (NEURONTIN) 300 MG capsule Take 300 mg by mouth. Not sure how many times a day he is currently taking.     losartan  (COZAAR) 50 MG tablet Take 1 tablet by mouth once daily 90 tablet 3   metoprolol succinate (TOPROL-XL) 25 MG 24 hr tablet Take 1/2 tablet by mouth as needed for A-fib     MITIGARE 0.6 MG CAPS Take 1 capsule by mouth once daily 90 capsule 0   Multiple Vitamin (MULTIVITAMIN WITH MINERALS) TABS tablet Take 1 tablet by mouth at bedtime.  Omega-3 Fatty Acids (FISH OIL) 1000 MG CAPS Take 1,000 mg by mouth in the morning.     omeprazole (PRILOSEC) 40 MG capsule TAKE 1 CAPSULE BY MOUTH TWICE DAILY BEFORE A MEAL 180 capsule 3   potassium chloride (KLOR-CON M) 10 MEQ tablet Take 1 tablet by mouth once daily 30 tablet 6   TURMERIC PO Take 1 capsule by mouth every other day.      No current facility-administered medications for this encounter.    Allergies  Allergen Reactions   Hydrochlorothiazide-Propranolol [Propranolol-Hctz] Other (See Comments)    Joints lock up   Penicillins Other (See Comments)    Hives Makes joints stiff and unable to move Has patient had a PCN reaction causing immediate rash, facial/tongue/throat swelling, SOB or lightheadedness with hypotension: No Has patient had a PCN reaction causing severe rash involving mucus membranes or skin necrosis: No Has patient had a PCN reaction that required hospitalization No Has patient had a PCN reaction occurring within the last 10 years: No If all of the above answers are "NO", then may proceed with Cephalosporin use.   Hydrochlorothiazide     Social History   Socioeconomic History   Marital status: Married    Spouse name: Not on file   Number of children: 3   Years of education: Not on file   Highest education level: Not on file  Occupational History   Not on file  Tobacco Use   Smoking status: Former    Packs/day: 1.00    Years: 10.00    Total pack years: 10.00    Types: Cigarettes    Quit date: 04/02/1994    Years since quitting: 28.4   Smokeless tobacco: Former    Types: Chew   Tobacco comments:    Former smoker  and chew 07/15/22  Vaping Use   Vaping Use: Never used  Substance and Sexual Activity   Alcohol use: No    Alcohol/week: 0.0 standard drinks of alcohol    Comment: "stopped in 1994"   Drug use: Not Currently    Types: Marijuana    Comment: "stopped marijuna in 1994"   Sexual activity: Yes  Other Topics Concern   Not on file  Social History Narrative   Lives in [place] with spouse.  3 children, all grown.   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   Social Determinants of Radio broadcast assistant Strain: Not on [employer] Insecurity: Not on file  Transportation Needs: Not on file  Physical Activity: Not on file  Stress: Not on file  Social Connections: Not on file  Intimate Partner Violence: Not on file    Family History  Problem Relation Age of Onset   Atrial fibrillation Mother 68       had a stroke   Hypertension Father    Diabetes Father    Colon cancer Neg Hx    Esophageal cancer Neg Hx    Stomach cancer Neg Hx     ROS- All systems are reviewed and negative except as per the HPI above  Physical Exam: Vitals:   09/24/22 0940  BP: (!) 144/76  Pulse: (!) 56  Weight: (!) 141.9 kg  Height: 6' 2"$  (1.88 m)    GEN- The patient is a well appearing obese male, alert and oriented x 3 today.   HEENT-head normocephalic, atraumatic, sclera clear, conjunctiva pink, hearing intact, trachea midline. Lungs- Clear to ausculation bilaterally, normal work of breathing Heart- Regular rate  and rhythm, occasional ectopic beat, no murmurs, rubs or gallops  GI- soft, NT, ND, + BS Extremities- no clubbing, cyanosis, or edema MS- no significant deformity or atrophy Skin- no rash or lesion Psych- euthymic mood, full affect Neuro- strength and sensation are intact  Filed Weights   09/24/22 0940  Weight: (!) 141.9 kg    ECG today demonstrates SB, PACs vs sinus arrhythmia vs blocked PACs Vent. rate 56 BPM PR interval 184 ms QRS duration 102  ms QT/QTcB 420/405 ms  CHA2DS2-VASc Score = 1  The patient's score is based upon: CHF History: 0 HTN History: 1 Diabetes History: 0 Stroke History: 0 Vascular Disease History: 0 Age Score: 0 Gender Score: 0       ASSESSMENT AND PLAN: 1. Paroxysmal Atrial Fibrillation (ICD10:  I48.0) The patient's CHA2DS2-VASc score is 1, indicating a 0.6% annual risk of stroke.   Patient back in SR Continue dofetilide 500 mg BID. QT stable. We discussed rhythm control options today. Would not favor amiodarone if possible given his young age. He has lost ~ 50 lbs total, 20 lbs over the last two years. He may be a good candidate for ablation now. Will refer to EP to discuss.  Continue Toprol 12.5 mg daily PRN for heart racing.  Continue Eliquis 5 mg BID  2. Obesity Body mass index is 40.16 kg/m. Lifestyle modification was discussed and encouraged including regular physical activity and weight reduction. Patient doing well with weight loss.  3. HTN Stable, no changes today.  4.  Bradycardia Has limited medication options for his afib.  5. Chronic Atypical Chest pain CT- no significant calcification of coronary arteries  LHC in 2018, No significant CAD   6. OSA Encouraged compliance with CPAP therapy.   Follow up with EP to establish care and discuss afib ablation.    [facility] [address] [place], [place] [address] [phone]

## 2022-10-01 ENCOUNTER — Ambulatory Visit: Payer: 59 | Attending: Cardiology | Admitting: Cardiology

## 2022-10-01 ENCOUNTER — Encounter: Payer: Self-pay | Admitting: Cardiology

## 2022-10-01 VITALS — BP 130/84 | HR 64 | Ht 74.0 in | Wt 319.0 lb

## 2022-10-01 DIAGNOSIS — G4733 Obstructive sleep apnea (adult) (pediatric): Secondary | ICD-10-CM | POA: Diagnosis not present

## 2022-10-01 DIAGNOSIS — I4819 Other persistent atrial fibrillation: Secondary | ICD-10-CM

## 2022-10-01 DIAGNOSIS — Z79899 Other long term (current) drug therapy: Secondary | ICD-10-CM | POA: Diagnosis not present

## 2022-10-01 NOTE — Progress Notes (Signed)
Electrophysiology Office Note   Date:  10/01/2022   ID:  Wayne, Leblanc [birth year], MRN [id number]  PCP:  [name], [name]  Cardiologist:  [name] Primary Electrophysiologist:  [name] [name], [name]    Chief Complaint: AF   History of Present Illness: [name] is a 57 y.o. male who is being seen today for the evaluation of AF at the request of [name], [name]. Presenting today for electrophysiology evaluation.  He has a history significant for atrial fibrillation.  He currently takes dofetilide.  He also has sleep apnea on CPAP, hypertension, morbid obesity.  He was seen in the emergency room 09/20/2022 for atrial fibrillation and chest pain.  Symptoms improved with rate control.  He converted to sinus rhythm prior to discharge.  Today, he denies symptoms of palpitations, chest pain, shortness of breath, orthopnea, PND, lower extremity edema, claudication, dizziness, presyncope, syncope, bleeding, or neurologic sequela. The patient is tolerating medications without difficulties.  He feels well.  He does continue to have episodes of atrial fibrillation.  He has some chest discomfort, shortness of breath, fatigue when he is in atrial fibrillation.  His dofetilide has improved his atrial fibrillation control, though it is not completely controlled it.  He would like an alternative method of rhythm control.   Past Medical History:  Diagnosis Date   Allergy    Arthritis    "knees" (01/22/2017)   Cataract    Chronic lower back pain    GERD (gastroesophageal reflux disease)    Gout    "no flares in awhile" (01/22/2017)   History of MRI of cervical spine 09/25/2010   normal; left shoulder normal as well   Hypertension    Migraine    "~ q 6 months" (01/22/2017)   Morbid obesity ([place])    "I've lost 43# in 15 weeks w/weight watchers" (01/22/2017)   OSA on CPAP    Paroxysmal atrial fibrillation ([place]) 02/2014   chads2vasc score is 1   Past Surgical History:  Procedure  Laterality Date   CARDIAC CATHETERIZATION  05/2005   "no blockage"   KNEE ARTHROSCOPY Right 02/2013   LEFT HEART CATH AND CORONARY ANGIOGRAPHY N/A 01/27/2017   Procedure: Left Heart Cath and Coronary Angiography;  Surgeon: [name], [name];  Location: [facility];  Service: Cardiovascular;  Laterality: N/A;     Current Outpatient Medications  Medication Sig Dispense Refill   atorvastatin (LIPITOR) 20 MG tablet Take 1 tablet (20 mg total) by mouth every other day. 45 tablet 3   baclofen (LIORESAL) 10 MG tablet TAKE 1/2 (ONE-HALF) TABLET BY MOUTH ONCE DAILY AS NEEDED FOR MUSCLE SPASM 30 tablet 0   diclofenac Sodium (VOLTAREN) 1 % GEL Apply 2 g topically daily as needed (For pain). 50 g 1   dofetilide (TIKOSYN) 500 MCG capsule Take 1 capsule (500 mcg total) by mouth 2 (two) times daily. 180 capsule 2   ELIQUIS 5 MG TABS tablet Take 1 tablet by mouth twice daily 180 tablet 3   fexofenadine (ALLEGRA) 60 MG tablet Take 1 tablet (60 mg total) by mouth daily. 90 tablet 0   fluticasone (FLONASE) 50 MCG/ACT nasal spray Place 2 sprays into both nostrils daily. 16 g 6   gabapentin (NEURONTIN) 300 MG capsule Take 300 mg by mouth. Not sure how many times a day he is currently taking.     losartan (COZAAR) 50 MG tablet Take 1 tablet by mouth once daily 90 tablet 3   metoprolol succinate (  TOPROL-XL) 25 MG 24 hr tablet Take 1/2 tablet by mouth as needed for A-fib     MITIGARE 0.6 MG CAPS Take 1 capsule by mouth once daily 90 capsule 0   Multiple Vitamin (MULTIVITAMIN WITH MINERALS) TABS tablet Take 1 tablet by mouth at bedtime.     Omega-3 Fatty Acids (FISH OIL) 1000 MG CAPS Take 1,000 mg by mouth in the morning.     omeprazole (PRILOSEC) 40 MG capsule TAKE 1 CAPSULE BY MOUTH TWICE DAILY BEFORE A MEAL 180 capsule 3   potassium chloride (KLOR-CON M) 10 MEQ tablet Take 1 tablet by mouth once daily 30 tablet 6   TURMERIC PO Take 1 capsule by mouth every other day.      No current  facility-administered medications for this visit.    Allergies:   Hydrochlorothiazide-propranolol [propranolol-hctz], Penicillins, and Hydrochlorothiazide   Social History:  The patient  reports that he quit smoking about 28 years ago. His smoking use included cigarettes. He has a 10.00 pack-year smoking history. He has quit using smokeless tobacco.  His smokeless tobacco use included chew. He reports that he does not currently use drugs after having used the following drugs: Marijuana. He reports that he does not drink alcohol.   Family History:  The patient's family history includes Atrial fibrillation (age of onset: 62) in his mother; Diabetes in his father; Hypertension in his father.    ROS:  Please see the history of present illness.   Otherwise, review of systems is positive for none.   All other systems are reviewed and negative.    PHYSICAL EXAM: VS:  BP 130/84   Pulse 64   Ht '6\' 2"'$  (1.88 m)   Wt (!) 319 lb (144.7 kg)   SpO2 96%   BMI 40.96 kg/m  , BMI Body mass index is 40.96 kg/m. GEN: Well nourished, well developed, in no acute distress  HEENT: normal  Neck: no JVD, carotid bruits, or masses Cardiac: RRR; no murmurs, rubs, or gallops,no edema  Respiratory:  clear to auscultation bilaterally, normal work of breathing GI: soft, nontender, nondistended, + BS MS: no deformity or atrophy  Skin: warm and dry Neuro:  Strength and sensation are intact Psych: euthymic mood, full affect  EKG:  EKG is not ordered today. Personal review of the ekg ordered 09/23/22 shows AF  Recent Labs: 03/26/2022: ALT 29 07/22/2022: Magnesium 2.1 09/20/2022: BUN 12; Creatinine, Ser 0.93; Hemoglobin 13.6; Platelets 229; Potassium 4.2; Sodium 138    Lipid Panel     Component Value Date/Time   CHOL 129 01/07/2022 1603   TRIG 191 (H) 01/07/2022 1603   HDL 31 (L) 01/07/2022 1603   CHOLHDL 4.2 01/07/2022 1603   CHOLHDL 7.2 01/22/2017 2320   VLDL 46 (H) 01/22/2017 2320   LDLCALC 66  01/07/2022 1603     Wt Readings from Last 3 Encounters:  10/01/22 (!) 319 lb (144.7 kg)  09/24/22 (!) 312 lb 12.8 oz (141.9 kg)  09/20/22 (!) 310 lb (140.6 kg)      Other studies Reviewed: Additional studies/ records that were reviewed today include: TTE 2018  Review of the above records today demonstrates:  - Left ventricle: The cavity size was mildly dilated. There was    moderate focal basal hypertrophy. Systolic function was normal.    The estimated ejection fraction was in the range of 60% to 65%.    Wall motion was normal; there were no regional wall motion    abnormalities. Features are consistent with a  pseudonormal left    ventricular filling pattern, with concomitant abnormal relaxation    and increased filling pressure (grade 2 diastolic dysfunction).    Doppler parameters are consistent with high ventricular filling    pressure.  - Aorta: Ascending aorta diameter: 38 mm (ED).  - Ascending aorta: The ascending aorta was mildly dilated.  - Mitral valve: Valve area by pressure half-time: 2.27 cm^2.  - Right ventricle: The cavity size was severely dilated. Wall    thickness was normal.    ASSESSMENT AND PLAN:  1.  Paroxysmal atrial fibrillation: Currently on dofetilide, metoprolol, Eliquis.  CHA2DS2-VASc of 1.  He is unfortunately continued to have episodes of atrial fibrillation despite dofetilide.  Would prefer to avoid amiodarone due to young age.  Due to that, we Calia Napp plan for ablation.  Risk and benefits have been discussed.  He understands the risks and is agreed to the procedure.  Risk, benefits, and alternatives to EP study and radiofrequency ablation for afib were also discussed in detail today. These risks include but are not limited to stroke, bleeding, vascular damage, tamponade, perforation, damage to the esophagus, lungs, and other structures, pulmonary vein stenosis, worsening renal function, and death. The patient understands these risk and wishes to proceed.   We Charlies Rayburn therefore proceed with catheter ablation at the next available time.  Carto, ICE, anesthesia are requested for the procedure.  Chancy Smigiel also obtain CT PV protocol prior to the procedure to exclude LAA thrombus and further evaluate atrial anatomy.   2.  Obesity: Lifestyle modification encouraged.  Patient is lost weight over the last few months and Daliana Leverett continue to try and lose weight. Body mass index is 40.96 kg/m.  3.  Hypertension: Currently well-controlled  4.  Obstructive sleep apnea: CPAP compliance encouraged  5.  High risk medication monitoring: Currently on dofetilide.  Recent labs within normal limits.  Current medicines are reviewed at length with the patient today.   The patient does not have concerns regarding his medicines.  The following changes were made today:  none  Labs/ tests ordered today include:  No orders of the defined types were placed in this encounter.    Disposition:   FU with Martavion Couper 3 months  Signed, Wayne Bittick [name], [name]  10/01/2022 2:42 PM     [address] [place] [address] [phone] (office) [phone] (fax)

## 2022-10-01 NOTE — Patient Instructions (Signed)
Medication Instructions:  Your physician recommends that you continue on your current medications as directed. Please refer to the Current Medication list given to you today.  *If you need a refill on your cardiac medications before your next appointment, please call your pharmacy*   Lab Work: Pre procedure labs -- see procedure instruction letter:  BMP & CBC  If you have labs (blood work) drawn today and your tests are completely normal, you will receive your results only by: [place] (if you have MyChart) OR A paper copy in the mail If you have any lab test that is abnormal or we need to change your treatment, we will call you to review the results.   Testing/Procedures: Your physician has requested that you have cardiac CT within 7 days PRIOR to your ablation. Cardiac computed tomography (CT) is a painless test that uses an x-ray machine to take clear, detailed pictures of your heart.  Please follow instruction below located under "other instructions". You will get a call from our office to schedule the date for this test.  Your physician has recommended that you have an ablation. Catheter ablation is a medical procedure used to treat some cardiac arrhythmias (irregular heartbeats). During catheter ablation, a long, thin, flexible tube is put into a blood vessel in your groin (upper thigh), or neck. This tube is called an ablation catheter. It is then guided to your heart through the blood vessel. Radio frequency waves destroy small areas of heart tissue where abnormal heartbeats may cause an arrhythmia to start.   EP Scheduler will call you to arrange this procedure for sometime this summer   Follow-Up: At [facility], you and your health needs are our priority.  As part of our continuing mission to provide you with exceptional heart care, we have created designated Provider Care Teams.  These Care Teams include your primary Cardiologist (physician) and Advanced Practice  Providers (APPs -  Physician Assistants and Nurse Practitioners) who all work together to provide you with the care you need, when you need it.  Your next appointment:   1 month(s) after your ablation  The format for your next appointment:   In Person  Provider:   AFib clinic   Thank you for choosing [facility]!!   Trinidad Curet, RN [phone]    Other Instructions   Cardiac Ablation Cardiac ablation is a procedure to destroy (ablate) some heart tissue that is sending bad signals. These bad signals cause problems in heart rhythm. The heart has many areas that make these signals. If there are problems in these areas, they can make the heart beat in a way that is not normal. Destroying some tissues can help make the heart rhythm normal. Tell your doctor about: Any allergies you have. All medicines you are taking. These include vitamins, herbs, eye drops, creams, and over-the-counter medicines. Any problems you or family members have had with medicines that make you fall asleep (anesthetics). Any blood disorders you have. Any surgeries you have had. Any medical conditions you have, such as kidney failure. Whether you are pregnant or may be pregnant. What are the risks? This is a safe procedure. But problems may occur, including: Infection. Bruising and bleeding. Bleeding into the chest. Stroke or blood clots. Damage to nearby areas of your body. Allergies to medicines or dyes. The need for a pacemaker if the normal system is damaged. Failure of the procedure to treat the problem. What happens before the procedure? Medicines Ask your doctor about:  Changing or stopping your normal medicines. This is important. Taking aspirin and ibuprofen. Do not take these medicines unless your doctor tells you to take them. Taking other medicines, vitamins, herbs, and supplements. General instructions Follow instructions from your doctor about what you cannot eat or drink. Plan  to have someone take you home from the hospital or clinic. If you will be going home right after the procedure, plan to have someone with you for 24 hours. Ask your doctor what steps will be taken to prevent infection. What happens during the procedure?  An IV tube will be put into one of your veins. You will be given a medicine to help you relax. The skin on your neck or groin will be numbed. A cut (incision) will be made in your neck or groin. A needle will be put through your cut and into a large vein. A tube (catheter) will be put into the needle. The tube will be moved to your heart. Dye may be put through the tube. This helps your doctor see your heart. Small devices (electrodes) on the tube will send out signals. A type of energy will be used to destroy some heart tissue. The tube will be taken out. Pressure will be held on your cut. This helps stop bleeding. A bandage will be put over your cut. The exact procedure may vary among doctors and hospitals. What happens after the procedure? You will be watched until you leave the hospital or clinic. This includes checking your heart rate, breathing rate, oxygen, and blood pressure. Your cut will be watched for bleeding. You will need to lie still for a few hours. Do not drive for 24 hours or as long as your doctor tells you. Summary Cardiac ablation is a procedure to destroy some heart tissue. This is done to treat heart rhythm problems. Tell your doctor about any medical conditions you may have. Tell him or her about all medicines you are taking to treat them. This is a safe procedure. But problems may occur. These include infection, bruising, bleeding, and damage to nearby areas of your body. Follow what your doctor tells you about food and drink. You may also be told to change or stop some of your medicines. After the procedure, do not drive for 24 hours or as long as your doctor tells you. This information is not intended to replace  advice given to you by your health care provider. Make sure you discuss any questions you have with your health care provider. Document Revised: 10/12/2021 Document Reviewed: 06/24/2019 Elsevier Patient Education  [place].

## 2022-10-07 ENCOUNTER — Other Ambulatory Visit: Payer: Self-pay | Admitting: Family Medicine

## 2022-10-10 MED ORDER — FEXOFENADINE HCL 60 MG PO TABS: 60.0000 mg | ORAL_TABLET | Freq: Every day | ORAL | 0 refills | Status: DC

## 2022-10-11 ENCOUNTER — Telehealth: Payer: Self-pay

## 2022-10-11 DIAGNOSIS — I4819 Other persistent atrial fibrillation: Secondary | ICD-10-CM

## 2022-10-11 NOTE — Telephone Encounter (Signed)
Pt is scheduled for Afib Ablation with Dr. Curt Bears on 01/30/23 @ 7:30.  He will have labs done on 01/13/23.Marland Kitchen  He was instructed to restart Eliquis 3 weeks prior but he states he is already taking this.

## 2022-11-30 ENCOUNTER — Other Ambulatory Visit: Payer: Self-pay | Admitting: Gastroenterology

## 2022-11-30 NOTE — Telephone Encounter (Signed)
Must have an office visit to receive additional refills through our office. Last seen 8/22.

## 2022-12-10 ENCOUNTER — Ambulatory Visit (INDEPENDENT_AMBULATORY_CARE_PROVIDER_SITE_OTHER): Payer: 59 | Admitting: Family Medicine

## 2022-12-10 ENCOUNTER — Encounter: Payer: Self-pay | Admitting: Family Medicine

## 2022-12-10 VITALS — BP 119/79 | HR 59 | Ht 74.0 in | Wt 313.2 lb

## 2022-12-10 DIAGNOSIS — Z125 Encounter for screening for malignant neoplasm of prostate: Secondary | ICD-10-CM

## 2022-12-10 DIAGNOSIS — Z0000 Encounter for general adult medical examination without abnormal findings: Secondary | ICD-10-CM | POA: Diagnosis not present

## 2022-12-10 DIAGNOSIS — M545 Low back pain, unspecified: Secondary | ICD-10-CM | POA: Diagnosis not present

## 2022-12-10 DIAGNOSIS — Z23 Encounter for immunization: Secondary | ICD-10-CM | POA: Diagnosis not present

## 2022-12-10 DIAGNOSIS — G8929 Other chronic pain: Secondary | ICD-10-CM

## 2022-12-10 NOTE — Patient Instructions (Addendum)
It was nice seeing you today!  Let us know if you would like to pursue physical therapy.  Stay well, Littie Deeds, MD [facility] [phone]  --  Make sure to check out at the front desk before you leave today.  Please arrive at least 15 minutes prior to your scheduled appointments.  If you had blood work today, I will send you a MyChart message or a letter if results are normal. Otherwise, I will give you a call.  If you had a referral placed, they will call you to set up an appointment. Please give [place] a call if you don't hear back in the next 2 weeks.  If you need additional refills before your next appointment, please call your pharmacy first.

## 2022-12-10 NOTE — Progress Notes (Signed)
    SUBJECTIVE:   CHIEF COMPLAINT / HPI:  Chief Complaint  Patient presents with   Annual Exam    Asking about prostate cancer screening. He is unsure about family history of prostate cancer.  Patient has had lower back pain for years. He is followed by neurosurgery, reports he had injections in his back 3 months ago. He reports he had pain relief for a few days after the injection but pain started coming back.  Previously has done physical therapy.  He reports doing 50 minutes of exercise daily.  He does 35 minutes on the elliptical and the remainder of the time he does some weightlifting. Former smoker, smoked 1 pack/day for 10 years, quit in 1995. Denies alcohol use. Feels safe in relationship.  PERTINENT  PMH / PSH: Paroxysmal A-fib, OSA on CPAP   Patient Care Team: Latrelle Dodrill, MD as PCP - General (Pediatrics) Nahser, Deloris Ping, MD as PCP - Cardiology (Cardiology) Regan Lemming, MD as PCP - Electrophysiology (Cardiology)   OBJECTIVE:   BP 119/79   Pulse (!) 59   Ht 6\' 2"  (1.88 m)   Wt (!) 313 lb 4 oz (142.1 kg)   SpO2 98%   BMI 40.22 kg/m   Physical Exam Constitutional:      General: He is not in acute distress.    Appearance: He is obese.  Cardiovascular:     Rate and Rhythm: Normal rate and regular rhythm.  Pulmonary:     Effort: Pulmonary effort is normal. No respiratory distress.     Breath sounds: Normal breath sounds.  Neurological:     Mental Status: He is alert.         12/10/2022    2:41 PM  Depression screen PHQ 2/9  Decreased Interest 0  Down, Depressed, Hopeless 0  PHQ - 2 Score 0  Altered sleeping 0  Tired, decreased energy 0  Change in appetite 0  Feeling bad or failure about yourself  0  Trouble concentrating 0  Moving slowly or fidgety/restless 0  Suicidal thoughts 0  PHQ-9 Score 0     Wt Readings from Last 3 Encounters:  12/10/22 (!) 313 lb 4 oz (142.1 kg)  10/01/22 (!) 319 lb (144.7 kg)  09/24/22 (!) 312 lb 12.8  oz (141.9 kg)        ASSESSMENT/PLAN:   1. Encounter for routine history and physical examination of adult Encouraged to get shingles vaccination  2. Prostate cancer screening Counseling provided today, patient would like to proceed with prostate cancer screening - PSA  3. Need for vaccination - Tdap vaccine greater than or equal to 7yo IM  4. Chronic left-sided low back pain without sciatica Established with neurosurgery.  Offered more physical therapy but patient declined.  Return in about 6 months (around 06/12/2023) for f/u HTN.   Littie Deeds, MD [facility]

## 2022-12-11 LAB — PSA: Prostate Specific Ag, Serum: 0.9 ng/mL (ref 0.0–4.0)

## 2022-12-17 ENCOUNTER — Other Ambulatory Visit (HOSPITAL_COMMUNITY): Payer: Self-pay | Admitting: *Deleted

## 2022-12-17 MED ORDER — APIXABAN 5 MG PO TABS: 5.0000 mg | ORAL_TABLET | Freq: Two times a day (BID) | ORAL | 3 refills | Status: DC

## 2022-12-20 ENCOUNTER — Encounter: Payer: Self-pay | Admitting: *Deleted

## 2022-12-23 ENCOUNTER — Other Ambulatory Visit: Payer: Self-pay | Admitting: Family Medicine

## 2022-12-24 MED ORDER — MITIGARE 0.6 MG PO CAPS: 1.0000 | ORAL_CAPSULE | Freq: Every day | ORAL | 0 refills | Status: DC

## 2023-01-10 ENCOUNTER — Other Ambulatory Visit: Payer: Self-pay | Admitting: Family Medicine

## 2023-01-13 ENCOUNTER — Ambulatory Visit: Payer: 59 | Attending: Cardiology

## 2023-01-13 DIAGNOSIS — I4819 Other persistent atrial fibrillation: Secondary | ICD-10-CM

## 2023-01-13 MED ORDER — FEXOFENADINE HCL 60 MG PO TABS: 60.0000 mg | ORAL_TABLET | Freq: Every day | ORAL | 0 refills | Status: DC

## 2023-01-14 LAB — CBC
Hematocrit: 39.2 % (ref 37.5–51.0)
Hemoglobin: 12.8 g/dL — ABNORMAL LOW (ref 13.0–17.7)
MCH: 27.2 pg (ref 26.6–33.0)
MCHC: 32.7 g/dL (ref 31.5–35.7)
MCV: 83 fL (ref 79–97)
Platelets: 230 10*3/uL (ref 150–450)
RBC: 4.71 x10E6/uL (ref 4.14–5.80)
RDW: 13.9 % (ref 11.6–15.4)
WBC: 10.5 10*3/uL (ref 3.4–10.8)

## 2023-01-14 LAB — BASIC METABOLIC PANEL
BUN/Creatinine Ratio: 16 (ref 9–20)
BUN: 16 mg/dL (ref 6–24)
CO2: 23 mmol/L (ref 20–29)
Calcium: 9.2 mg/dL (ref 8.7–10.2)
Chloride: 105 mmol/L (ref 96–106)
Creatinine, Ser: 0.99 mg/dL (ref 0.76–1.27)
Glucose: 100 mg/dL — ABNORMAL HIGH (ref 70–99)
Potassium: 4.2 mmol/L (ref 3.5–5.2)
Sodium: 140 mmol/L (ref 134–144)
eGFR: 88 mL/min/{1.73_m2} (ref >59)

## 2023-01-21 ENCOUNTER — Encounter (HOSPITAL_COMMUNITY): Payer: Self-pay

## 2023-01-23 ENCOUNTER — Ambulatory Visit (HOSPITAL_COMMUNITY)
Admission: RE | Admit: 2023-01-23 | Discharge: 2023-01-23 | Disposition: A | Discharge status: Home / Self Care | Payer: 59 | Source: Ambulatory Visit | Attending: Cardiology | Admitting: Cardiology

## 2023-01-23 DIAGNOSIS — I4819 Other persistent atrial fibrillation: Secondary | ICD-10-CM

## 2023-01-23 MED ORDER — IOHEXOL 350 MG/ML SOLN
100.0000 mL | Freq: Once | INTRAVENOUS | Status: AC | PRN
  Administered 2023-01-23: 100 mL via INTRAVENOUS

## 2023-01-23 MED ORDER — NITROGLYCERIN 0.4 MG SL SUBL: 0.8000 mg | SUBLINGUAL_TABLET | Freq: Once | SUBLINGUAL | Status: DC

## 2023-01-29 NOTE — Anesthesia Preprocedure Evaluation (Signed)
Anesthesia Evaluation  Patient identified by MRN, date of birth, ID band Patient awake    Reviewed: Allergy & Precautions, NPO status , Patient's Chart, lab work & pertinent test results  History of Anesthesia Complications Negative for: history of anesthetic complications  Airway Mallampati: II  TM Distance: >3 FB Neck ROM: Full    Dental  (+) Edentulous Upper, Poor Dentition, Missing, Chipped, Dental Advisory Given   Pulmonary shortness of breath, sleep apnea and Continuous Positive Airway Pressure Ventilation , former smoker   breath sounds clear to auscultation       Cardiovascular hypertension, Pt. on medications and Pt. on home beta blockers (-) angina + dysrhythmias Atrial Fibrillation  Rhythm:Irregular Rate:Normal  '18 ECHO: EF 60-65%, normal LVF, grade 2 DD, no significant valvular abnormalities  '18 cath: normal coronaries   Neuro/Psych negative neurological ROS  negative psych ROS   GI/Hepatic Neg liver ROS,GERD  Medicated and Controlled,,  Endo/Other  BMI 39  Renal/GU negative Renal ROS     Musculoskeletal   Abdominal  (+) + obese  Peds  Hematology eliquis   Anesthesia Other Findings   Reproductive/Obstetrics                             Anesthesia Physical Anesthesia Plan  ASA: 3  Anesthesia Plan: General   Post-op Pain Management: Tylenol PO (pre-op)*   Induction: Intravenous  PONV Risk Score and Plan: 2 and Ondansetron and Dexamethasone  Airway Management Planned: Oral ETT  Additional Equipment: None  Intra-op Plan:   Post-operative Plan: Extubation in OR  Informed Consent: I have reviewed the patients History and Physical, chart, labs and discussed the procedure including the risks, benefits and alternatives for the proposed anesthesia with the patient or authorized representative who has indicated his/her understanding and acceptance.     Dental advisory  given  Plan Discussed with: CRNA and Surgeon  Anesthesia Plan Comments:         Anesthesia Quick Evaluation

## 2023-01-29 NOTE — Pre-Procedure Instructions (Signed)
Instructed patient on the following items: Arrival time 0515 Nothing to eat or drink after midnight No meds AM of procedure Responsible person to drive you home and stay with you for 24 hrs  Have you missed any doses of anti-coagulant Eliquis- takes twice a day, hasn't missed any doses.  Don't take dose in the morning.   

## 2023-01-30 ENCOUNTER — Ambulatory Visit (HOSPITAL_COMMUNITY): Payer: 59 | Admitting: Anesthesiology

## 2023-01-30 ENCOUNTER — Encounter (HOSPITAL_COMMUNITY): Payer: Self-pay | Admitting: Cardiology

## 2023-01-30 ENCOUNTER — Encounter (HOSPITAL_COMMUNITY)
Admission: RE | Disposition: A | Discharge status: Home / Self Care | Payer: Self-pay | Source: Home / Self Care | Attending: Cardiology

## 2023-01-30 ENCOUNTER — Ambulatory Visit (HOSPITAL_COMMUNITY)
Admission: RE | Admit: 2023-01-30 | Discharge: 2023-01-30 | Disposition: A | Discharge status: Home / Self Care | Payer: 59 | Attending: Cardiology | Admitting: Cardiology

## 2023-01-30 ENCOUNTER — Ambulatory Visit (HOSPITAL_BASED_OUTPATIENT_CLINIC_OR_DEPARTMENT_OTHER): Payer: 59 | Admitting: Anesthesiology

## 2023-01-30 DIAGNOSIS — Z6839 Body mass index (BMI) 39.0-39.9, adult: Secondary | ICD-10-CM | POA: Insufficient documentation

## 2023-01-30 DIAGNOSIS — I480 Paroxysmal atrial fibrillation: Secondary | ICD-10-CM

## 2023-01-30 DIAGNOSIS — G4733 Obstructive sleep apnea (adult) (pediatric): Secondary | ICD-10-CM | POA: Diagnosis not present

## 2023-01-30 DIAGNOSIS — I4891 Unspecified atrial fibrillation: Secondary | ICD-10-CM | POA: Diagnosis not present

## 2023-01-30 DIAGNOSIS — Z9989 Dependence on other enabling machines and devices: Secondary | ICD-10-CM

## 2023-01-30 DIAGNOSIS — Z87891 Personal history of nicotine dependence: Secondary | ICD-10-CM | POA: Diagnosis not present

## 2023-01-30 DIAGNOSIS — I10 Essential (primary) hypertension: Secondary | ICD-10-CM | POA: Diagnosis not present

## 2023-01-30 HISTORY — PX: ATRIAL FIBRILLATION ABLATION: EP1191

## 2023-01-30 LAB — POCT ACTIVATED CLOTTING TIME
Activated Clotting Time: 263 seconds
Activated Clotting Time: 311 seconds

## 2023-01-30 SURGERY — ATRIAL FIBRILLATION ABLATION
Anesthesia: General

## 2023-01-30 MED ORDER — HEPARIN SODIUM (PORCINE) 1000 UNIT/ML IJ SOLN
INTRAMUSCULAR | Status: AC
  Filled 2023-01-30: qty 10

## 2023-01-30 MED ORDER — FENTANYL CITRATE (PF) 100 MCG/2ML IJ SOLN: 25.0000 ug | INTRAMUSCULAR | Status: DC | PRN

## 2023-01-30 MED ORDER — ACETAMINOPHEN 500 MG PO TABS
1000.0000 mg | ORAL_TABLET | Freq: Once | ORAL | Status: AC
  Administered 2023-01-30: 1000 mg via ORAL
  Filled 2023-01-30: qty 2

## 2023-01-30 MED ORDER — MEPERIDINE HCL 25 MG/ML IJ SOLN: 6.2500 mg | INTRAMUSCULAR | Status: DC | PRN

## 2023-01-30 MED ORDER — DEXAMETHASONE SODIUM PHOSPHATE 10 MG/ML IJ SOLN
INTRAMUSCULAR | Status: DC | PRN
  Administered 2023-01-30: 4 mg via INTRAVENOUS

## 2023-01-30 MED ORDER — ROCURONIUM BROMIDE 10 MG/ML (PF) SYRINGE
PREFILLED_SYRINGE | INTRAVENOUS | Status: DC | PRN
  Administered 2023-01-30: 70 mg via INTRAVENOUS

## 2023-01-30 MED ORDER — SODIUM CHLORIDE 0.9 % IV SOLN: INTRAVENOUS | Status: DC

## 2023-01-30 MED ORDER — SODIUM CHLORIDE 0.9% FLUSH: 3.0000 mL | INTRAVENOUS | Status: DC | PRN

## 2023-01-30 MED ORDER — ONDANSETRON HCL 4 MG/2ML IJ SOLN
INTRAMUSCULAR | Status: DC | PRN
  Administered 2023-01-30: 4 mg via INTRAVENOUS

## 2023-01-30 MED ORDER — MIDAZOLAM HCL 5 MG/5ML IJ SOLN
INTRAMUSCULAR | Status: DC | PRN
  Administered 2023-01-30: 2 mg via INTRAVENOUS

## 2023-01-30 MED ORDER — FENTANYL CITRATE (PF) 100 MCG/2ML IJ SOLN
INTRAMUSCULAR | Status: DC | PRN
  Administered 2023-01-30: 100 ug via INTRAVENOUS

## 2023-01-30 MED ORDER — HEPARIN (PORCINE) IN NACL 1000-0.9 UT/500ML-% IV SOLN
INTRAVENOUS | Status: DC | PRN
  Administered 2023-01-30 (×4): 500 mL

## 2023-01-30 MED ORDER — HEPARIN SODIUM (PORCINE) 1000 UNIT/ML IJ SOLN
INTRAMUSCULAR | Status: DC | PRN
  Administered 2023-01-30: 15000 [IU] via INTRAVENOUS
  Administered 2023-01-30: 7000 [IU] via INTRAVENOUS

## 2023-01-30 MED ORDER — SODIUM CHLORIDE 0.9% FLUSH: 3.0000 mL | Freq: Two times a day (BID) | INTRAVENOUS | Status: DC

## 2023-01-30 MED ORDER — SODIUM CHLORIDE 0.9 % IV SOLN: 250.0000 mL | INTRAVENOUS | Status: DC | PRN

## 2023-01-30 MED ORDER — OXYCODONE HCL 5 MG/5ML PO SOLN: 5.0000 mg | Freq: Once | ORAL | Status: DC | PRN

## 2023-01-30 MED ORDER — HEPARIN SODIUM (PORCINE) 1000 UNIT/ML IJ SOLN
INTRAMUSCULAR | Status: DC | PRN
  Administered 2023-01-30: 1000 [IU] via INTRAVENOUS

## 2023-01-30 MED ORDER — PROTAMINE SULFATE 10 MG/ML IV SOLN
INTRAVENOUS | Status: DC | PRN
  Administered 2023-01-30: 40 mg via INTRAVENOUS

## 2023-01-30 MED ORDER — SUGAMMADEX SODIUM 200 MG/2ML IV SOLN
INTRAVENOUS | Status: DC | PRN
  Administered 2023-01-30: 200 mg via INTRAVENOUS

## 2023-01-30 MED ORDER — PROPOFOL 10 MG/ML IV BOLUS
INTRAVENOUS | Status: DC | PRN
  Administered 2023-01-30: 200 mg via INTRAVENOUS

## 2023-01-30 MED ORDER — MIDAZOLAM HCL 2 MG/2ML IJ SOLN: 0.5000 mg | Freq: Once | INTRAMUSCULAR | Status: DC | PRN

## 2023-01-30 MED ORDER — PROMETHAZINE HCL 25 MG/ML IJ SOLN: 6.2500 mg | INTRAMUSCULAR | Status: DC | PRN

## 2023-01-30 MED ORDER — ONDANSETRON HCL 4 MG/2ML IJ SOLN: 4.0000 mg | Freq: Four times a day (QID) | INTRAMUSCULAR | Status: DC | PRN

## 2023-01-30 MED ORDER — DOBUTAMINE INFUSION FOR EP/ECHO/NUC (1000 MCG/ML)
INTRAVENOUS | Status: DC | PRN
  Administered 2023-01-30: 20 ug/kg/min via INTRAVENOUS

## 2023-01-30 MED ORDER — LIDOCAINE 2% (20 MG/ML) 5 ML SYRINGE
INTRAMUSCULAR | Status: DC | PRN
  Administered 2023-01-30: 40 mg via INTRAVENOUS

## 2023-01-30 MED ORDER — DOBUTAMINE INFUSION FOR EP/ECHO/NUC (1000 MCG/ML)
INTRAVENOUS | Status: AC
  Filled 2023-01-30: qty 250

## 2023-01-30 MED ORDER — ACETAMINOPHEN 325 MG PO TABS
650.0000 mg | ORAL_TABLET | ORAL | Status: DC | PRN
  Administered 2023-01-30: 650 mg via ORAL
  Filled 2023-01-30: qty 2

## 2023-01-30 MED ORDER — OXYCODONE HCL 5 MG PO TABS: 5.0000 mg | ORAL_TABLET | Freq: Once | ORAL | Status: DC | PRN

## 2023-01-30 MED ORDER — SUCCINYLCHOLINE CHLORIDE 200 MG/10ML IV SOSY: PREFILLED_SYRINGE | INTRAVENOUS | Status: DC | PRN

## 2023-01-30 SURGICAL SUPPLY — 21 items
BAG SNAP BAND KOVER 36X36 (MISCELLANEOUS) IMPLANT
CATH ABLAT QDOT MICRO BI TC DF (CATHETERS) IMPLANT
CATH OCTARAY 2.0 F 3-3-3-3-3 (CATHETERS) IMPLANT
CATH PIGTAIL STEERABLE D1 8.7 (WIRE) IMPLANT
CATH S-M CIRCA TEMP PROBE (CATHETERS) IMPLANT
CATH SOUNDSTAR ECO 8FR (CATHETERS) IMPLANT
CATH WEBSTER BI DIR CS D-F CRV (CATHETERS) IMPLANT
CLOSURE MYNX CONTROL 6F/7F (Vascular Products) IMPLANT
CLOSURE PERCLOSE PROSTYLE (VASCULAR PRODUCTS) IMPLANT
COVER SWIFTLINK CONNECTOR (BAG) ×1 IMPLANT
MAT PREVALON FULL STRYKER (MISCELLANEOUS) IMPLANT
PACK EP LATEX FREE (CUSTOM PROCEDURE TRAY) ×1
PACK EP LF (CUSTOM PROCEDURE TRAY) ×1 IMPLANT
PAD DEFIB RADIO PHYSIO CONN (PAD) ×1 IMPLANT
PATCH CARTO3 (PAD) IMPLANT
SHEATH CARTO VIZIGO SM CVD (SHEATH) IMPLANT
SHEATH PINNACLE 7F 10CM (SHEATH) IMPLANT
SHEATH PINNACLE 8F 10CM (SHEATH) IMPLANT
SHEATH PINNACLE 9F 10CM (SHEATH) IMPLANT
SHEATH PROBE COVER 6X72 (BAG) IMPLANT
TUBING SMART ABLATE COOLFLOW (TUBING) IMPLANT

## 2023-01-30 NOTE — Anesthesia Procedure Notes (Signed)
Procedure Name: Intubation Date/Time: 01/30/2023 7:39 AM  Performed by: Rachel Moulds, CRNAPre-anesthesia Checklist: Patient identified, Emergency Drugs available, Suction available, Patient being monitored and Timeout performed Patient Re-evaluated:Patient Re-evaluated prior to induction Oxygen Delivery Method: Circle system utilized Preoxygenation: Pre-oxygenation with 100% oxygen Induction Type: IV induction Ventilation: Mask ventilation with difficulty, Oral airway inserted - appropriate to patient size and Two handed mask ventilation required Laryngoscope Size: Mac and 4 Grade View: Grade II Tube type: Oral Tube size: 7.5 mm Number of attempts: 1 Airway Equipment and Method: Stylet Placement Confirmation: ETT inserted through vocal cords under direct vision, positive ETCO2, CO2 detector and breath sounds checked- equal and bilateral Secured at: 24 cm Tube secured with: Tape Dental Injury: Teeth and Oropharynx as per pre-operative assessment

## 2023-01-30 NOTE — Transfer of Care (Signed)
Immediate Anesthesia Transfer of Care Note  Patient: [name]  Procedure(s) Performed: ATRIAL FIBRILLATION ABLATION  Patient Location: Cath Lab  Anesthesia Type:General  Level of Consciousness: awake, alert , and oriented  Airway & Oxygen Therapy: Patient Spontanous Breathing and Patient connected to nasal cannula oxygen  Post-op Assessment: Report given to RN and Post -op Vital signs reviewed and stable  Post vital signs: Reviewed and stable  Last Vitals:  Vitals Value Taken Time  BP 135/85 01/30/23 0940  Temp    Pulse 87 01/30/23 0941  Resp 20 01/30/23 0941  SpO2 94 % 01/30/23 0941  Vitals shown include unvalidated device data.  Last Pain:  Vitals:   01/30/23 0607  TempSrc: Oral  PainSc: 0-No pain         Complications: There were no known notable events for this encounter.

## 2023-01-30 NOTE — Anesthesia Postprocedure Evaluation (Signed)
Anesthesia Post Note  Patient: Engineer, civil (consulting)  Procedure(s) Performed: ATRIAL FIBRILLATION ABLATION     Patient location during evaluation: PACU Anesthesia Type: General Level of consciousness: awake and alert, patient cooperative and oriented Pain management: pain level controlled Vital Signs Assessment: post-procedure vital signs reviewed and stable Respiratory status: spontaneous breathing, nonlabored ventilation and respiratory function stable Cardiovascular status: blood pressure returned to baseline and stable Postop Assessment: no apparent nausea or vomiting and adequate PO intake Anesthetic complications: no   There were no known notable events for this encounter.  Last Vitals:  Vitals:   01/30/23 1040 01/30/23 1045  BP: 137/88 (!) 143/81  Pulse: 67 70  Resp: 15 15  Temp:    SpO2: 93% 93%    Last Pain:  Vitals:   01/30/23 1047  TempSrc:   PainSc: 5                  Roselee Tayloe,E. Betsaida [place]

## 2023-01-30 NOTE — Discharge Instructions (Signed)

## 2023-01-30 NOTE — H&P (Signed)
Electrophysiology Office Note   Date:  01/30/2023   ID:  Wayne, Leblanc 10/31/1965, MRN [id number]  PCP:  Latrelle Dodrill, MD  Cardiologist:  Nahser Primary Electrophysiologist:  Janus Vlcek Jorja Loa, MD    Chief Complaint: AF   History of Present Illness: [name] is a 57 y.o. male who is being seen today for the evaluation of AF at the request of No ref. provider found. Presenting today for electrophysiology evaluation.  He has a history significant for atrial fibrillation.  He currently takes dofetilide.  He also has sleep apnea on CPAP, hypertension, morbid obesity.  He was seen in the emergency room 09/20/2022 for atrial fibrillation and chest pain.  Symptoms improved with rate control.  He converted to sinus rhythm prior to discharge.  Today, denies symptoms of palpitations, chest pain, shortness of breath, orthopnea, PND, lower extremity edema, claudication, dizziness, presyncope, syncope, bleeding, or neurologic sequela. The patient is tolerating medications without difficulties. Plan ablation today.    Past Medical History:  Diagnosis Date   Allergy    Arthritis    "knees" (01/22/2017)   Cataract    Chronic lower back pain    GERD (gastroesophageal reflux disease)    Gout    "no flares in awhile" (01/22/2017)   History of MRI of cervical spine 09/25/2010   normal; left shoulder normal as well   Hypertension    Migraine    "~ q 6 months" (01/22/2017)   Morbid obesity (HCC)    "I've lost 43# in 15 weeks w/weight watchers" (01/22/2017)   OSA on CPAP    Paroxysmal atrial fibrillation (HCC) 02/2014   chads2vasc score is 1   Past Surgical History:  Procedure Laterality Date   CARDIAC CATHETERIZATION  05/2005   "no blockage"   KNEE ARTHROSCOPY Right 02/2013   LEFT HEART CATH AND CORONARY ANGIOGRAPHY N/A 01/27/2017   Procedure: Left Heart Cath and Coronary Angiography;  Surgeon: Lennette Bihari, MD;  Location: [facility] INVASIVE CV LAB;  Service: Cardiovascular;   Laterality: N/A;     Current Facility-Administered Medications  Medication Dose Route Frequency Provider Last Rate Last Admin   0.9 %  sodium chloride infusion   Intravenous Continuous Cambryn Charters, Andree Coss, MD 50 mL/hr at 01/30/23 0600 New Bag at 01/30/23 0600    Allergies:   Hydrochlorothiazide-propranolol [propranolol-hctz], Penicillins, and Hydrochlorothiazide   Social History:  The patient  reports that he quit smoking about 28 years ago. His smoking use included cigarettes. He has a 10.00 pack-year smoking history. He has quit using smokeless tobacco.  His smokeless tobacco use included chew. He reports that he does not currently use drugs after having used the following drugs: Marijuana. He reports that he does not drink alcohol.   Family History:  The patient's family history includes Atrial fibrillation (age of onset: 58) in his mother; Diabetes in his father; Hypertension in his father.   ROS:  Please see the history of present illness.   Otherwise, review of systems is positive for none.   All other systems are reviewed and negative.   PHYSICAL EXAM: VS:  BP 137/85   Pulse 65   Temp 97.8 F (36.6 C) (Oral)   Resp 18   Ht 6\' 2"  (1.88 m)   Wt (!) 137.9 kg   SpO2 94%   BMI 39.03 kg/m  , BMI Body mass index is 39.03 kg/m. GEN: Well nourished, well developed, in no acute distress  HEENT: normal  Neck: no JVD, carotid bruits, or  masses Cardiac: RRR; no murmurs, rubs, or gallops,no edema  Respiratory:  clear to auscultation bilaterally, normal work of breathing GI: soft, nontender, nondistended, + BS MS: no deformity or atrophy  Skin: warm and dry Neuro:  Strength and sensation are intact Psych: euthymic mood, full affect   Recent Labs: 03/26/2022: ALT 29 07/22/2022: Magnesium 2.1 01/13/2023: BUN 16; Creatinine, Ser 0.99; Hemoglobin 12.8; Platelets 230; Potassium 4.2; Sodium 140    Lipid Panel     Component Value Date/Time   CHOL 129 01/07/2022 1603   TRIG 191 (H)  01/07/2022 1603   HDL 31 (L) 01/07/2022 1603   CHOLHDL 4.2 01/07/2022 1603   CHOLHDL 7.2 01/22/2017 2320   VLDL 46 (H) 01/22/2017 2320   LDLCALC 66 01/07/2022 1603     Wt Readings from Last 3 Encounters:  01/30/23 (!) 137.9 kg  12/10/22 (!) 142.1 kg  10/01/22 (!) 144.7 kg      Other studies Reviewed: Additional studies/ records that were reviewed today include: TTE 2018  Review of the above records today demonstrates:  - Left ventricle: The cavity size was mildly dilated. There was    moderate focal basal hypertrophy. Systolic function was normal.    The estimated ejection fraction was in the range of 60% to 65%.    Wall motion was normal; there were no regional wall motion    abnormalities. Features are consistent with a pseudonormal left    ventricular filling pattern, with concomitant abnormal relaxation    and increased filling pressure (grade 2 diastolic dysfunction).    Doppler parameters are consistent with high ventricular filling    pressure.  - Aorta: Ascending aorta diameter: 38 mm (ED).  - Ascending aorta: The ascending aorta was mildly dilated.  - Mitral valve: Valve area by pressure half-time: 2.27 cm^2.  - Right ventricle: The cavity size was severely dilated. Wall    thickness was normal.    ASSESSMENT AND PLAN:  1.  Paroxysmal atrial fibrillation: [name] has presented today for surgery, with the diagnosis of AF.  The various methods of treatment have been discussed with the patient and family. After consideration of risks, benefits and other options for treatment, the patient has consented to  Procedure(s): Catheter ablation as a surgical intervention .  Risks include but not limited to complete heart block, stroke, esophageal damage, nerve damage, bleeding, vascular damage, tamponade, perforation, MI, and death. The patient's history has been reviewed, patient examined, no change in status, stable for surgery.  I have reviewed the patient's chart and labs.   Questions were answered to the patient's satisfaction.    Karol Liendo Elberta Fortis, MD 01/30/2023 7:19 AM

## 2023-01-30 NOTE — Progress Notes (Signed)
Up and walked and tolerated well; right groin stable, no bleeding or hematoma 

## 2023-01-31 ENCOUNTER — Encounter (HOSPITAL_COMMUNITY): Payer: Self-pay | Admitting: Cardiology

## 2023-02-03 ENCOUNTER — Telehealth: Payer: Self-pay | Admitting: Cardiology

## 2023-02-03 NOTE — Telephone Encounter (Signed)
Spoke to the pt, on 6/27 pt had A-fib ablation on 6/28 pt stated he called the A-fib clinic due to groin pain. Pt stated he was advised to take alternate between tylenol and ibuprofen however, the pain is still the same. Pt denies any other symptoms. Will forward to MD and nurse for advise.

## 2023-02-03 NOTE — Telephone Encounter (Signed)
Pt states he is having pain on the right side where he had the procedure and is wondering if it is normal. Pt would like a callback to discuss this issue. Please advise.

## 2023-02-03 NOTE — Telephone Encounter (Signed)
Patient states soreness with ambulation feels like a pulled muscle on right side. No swelling or bruising noted. Pt able to lay/sit without any issues. Pt was concerned if this was normal post ablation - he will call if discomfort worsens and will bring in for assessment.

## 2023-02-18 ENCOUNTER — Ambulatory Visit (INDEPENDENT_AMBULATORY_CARE_PROVIDER_SITE_OTHER): Payer: 59 | Admitting: Student

## 2023-02-18 VITALS — BP 138/85 | HR 75 | Ht 74.0 in | Wt 312.4 lb

## 2023-02-18 DIAGNOSIS — H01005 Unspecified blepharitis left lower eyelid: Secondary | ICD-10-CM

## 2023-02-18 DIAGNOSIS — H01002 Unspecified blepharitis right lower eyelid: Secondary | ICD-10-CM

## 2023-02-18 DIAGNOSIS — R002 Palpitations: Secondary | ICD-10-CM | POA: Diagnosis not present

## 2023-02-18 NOTE — Progress Notes (Unsigned)
  SUBJECTIVE:   CHIEF COMPLAINT / HPI:   Eye Concern:  Present for a couple of days Not used any eye drops  Burning but no pain with eye movement  Denies fever or chills  No cough or dyspnea, no chest pain  Feels gritty in both eyes  No new things around his eyes or evidence of anything getting in his eyes Cough for about one week  Heart fluttering  Sinus pain  Notes granddaughter was recently sick  Both eyes  Tearing up a bit more, making vision more blurry       Has had an ablation for his Afib, feeling heart palpitations since his cough started   PERTINENT  PMH / PSH:  pAFib OSA on CPAP Sinus brady, HTN Allergic rhinitis  GERD DJD Gout   Patient Care Team: Latrelle Dodrill, MD as PCP - General (Pediatrics) Nahser, Deloris Ping, MD as PCP - Cardiology (Cardiology) Regan Lemming, MD as PCP - Electrophysiology (Cardiology) OBJECTIVE:  BP 138/85   Pulse 75   Ht 6\' 2"  (1.88 m)   Wt (!) 312 lb 6 oz (141.7 kg)   SpO2 97%   BMI 40.11 kg/m  Physical Exam  General: NAD, pleasant, able to participate in exam Card: RRR with no m/g/r Respiratory: No respiratory distress Skin: warm and dry, no rashes noted Psych: Normal affect and mood   ASSESSMENT/PLAN:  Palpitations Assessment & Plan: Call cardiology, on tikosyn. RRR on examination but concerned he intermittently flips to Afib.    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 Out of work for next couple of days given blurry vision 2/2 tears. Use artificial tears, compresses, keep clean. Flonase for allergies.     Return if symptoms worsen or fail to improve. Alfredo Martinez, MD 02/19/2023, 1:24 PM PGY-3, [facility]

## 2023-02-18 NOTE — Patient Instructions (Addendum)
It was great to see you today! Thank you for choosing [facility] for your primary care. [name] was seen for eye pain.  Today we addressed:  Please use artificial tears for your symptoms   I will write you out of work for the next few days   Please call your cardiologist to update them about heart fluttering   If symptoms are still present on 7/19, please call   If you haven't already, sign up for My Chart to have easy access to your labs results, and communication with your primary care physician.  I recommend that you always bring your medications to each appointment as this makes it easy to ensure you are on the correct medications and helps [place] not miss refills when you need them. Call the clinic at [phone] if your symptoms worsen or you have any concerns.  You should return to our clinic Return if symptoms worsen or fail to improve. Please arrive 15 minutes before your appointment to ensure smooth check in process.  We appreciate your efforts in making this happen.  Thank you for allowing me to participate in your care, Alfredo Martinez, MD 02/18/2023, 3:20 PM PGY-3, [facility]

## 2023-02-19 DIAGNOSIS — H01002 Unspecified blepharitis right lower eyelid: Secondary | ICD-10-CM | POA: Insufficient documentation

## 2023-02-19 NOTE — Assessment & Plan Note (Signed)
Likely blepharitis given presentation with no red flags likely secondary to allergies. Out of work for next couple of days given blurry vision 2/2 tears. Use artificial tears, compresses, keep clean. Flonase for allergies.

## 2023-02-19 NOTE — Assessment & Plan Note (Signed)
Call cardiology, on tikosyn. RRR on examination but concerned he intermittently flips to Afib.

## 2023-02-24 ENCOUNTER — Other Ambulatory Visit: Payer: Self-pay | Admitting: Cardiovascular Disease

## 2023-02-27 ENCOUNTER — Ambulatory Visit (HOSPITAL_COMMUNITY): Payer: 59 | Admitting: Physician Assistant

## 2023-03-01 ENCOUNTER — Other Ambulatory Visit: Payer: Self-pay | Admitting: Gastroenterology

## 2023-03-03 ENCOUNTER — Telehealth: Payer: Self-pay | Admitting: Physician Assistant

## 2023-03-03 MED ORDER — OMEPRAZOLE 40 MG PO CPDR: 40.0000 mg | DELAYED_RELEASE_CAPSULE | Freq: Every day | ORAL | 3 refills | Status: DC

## 2023-03-03 NOTE — Telephone Encounter (Signed)
Inbound call from patient requesting a refill for omeprazole medication. Is scheduled for 10/24, requesting a refill up until office visit. Please advise, thank you.

## 2023-03-03 NOTE — Telephone Encounter (Signed)
Rx for omeprazole sent to pharmacy

## 2023-03-06 ENCOUNTER — Ambulatory Visit (HOSPITAL_COMMUNITY)
Admission: RE | Admit: 2023-03-06 | Discharge: 2023-03-06 | Disposition: A | Discharge status: Home / Self Care | Payer: 59 | Source: Ambulatory Visit | Attending: Physician Assistant | Admitting: Physician Assistant

## 2023-03-06 ENCOUNTER — Encounter (HOSPITAL_COMMUNITY): Payer: Self-pay | Admitting: Physician Assistant

## 2023-03-06 VITALS — BP 132/82 | HR 75 | Ht 74.0 in | Wt 310.8 lb

## 2023-03-06 DIAGNOSIS — Z7901 Long term (current) use of anticoagulants: Secondary | ICD-10-CM | POA: Diagnosis not present

## 2023-03-06 DIAGNOSIS — E669 Obesity, unspecified: Secondary | ICD-10-CM | POA: Diagnosis not present

## 2023-03-06 DIAGNOSIS — Z79899 Other long term (current) drug therapy: Secondary | ICD-10-CM | POA: Diagnosis not present

## 2023-03-06 DIAGNOSIS — Z6839 Body mass index (BMI) 39.0-39.9, adult: Secondary | ICD-10-CM | POA: Insufficient documentation

## 2023-03-06 DIAGNOSIS — R0789 Other chest pain: Secondary | ICD-10-CM | POA: Insufficient documentation

## 2023-03-06 DIAGNOSIS — I480 Paroxysmal atrial fibrillation: Secondary | ICD-10-CM | POA: Diagnosis not present

## 2023-03-06 DIAGNOSIS — I10 Essential (primary) hypertension: Secondary | ICD-10-CM | POA: Insufficient documentation

## 2023-03-06 DIAGNOSIS — Z5181 Encounter for therapeutic drug level monitoring: Secondary | ICD-10-CM

## 2023-03-06 DIAGNOSIS — G4733 Obstructive sleep apnea (adult) (pediatric): Secondary | ICD-10-CM | POA: Diagnosis not present

## 2023-03-06 NOTE — Progress Notes (Signed)
Primary Care Physician: Latrelle Dodrill, MD Referring Physician: Dr. Johney Frame, [facility] f/u Cardiologist: Dr. Elease Hashimoto Primary EP: Dr Philis Nettle is a 57 y.o. male with a h/o PAF on  tikosyn. He feels that mostly he is staying in SR. He did have afib last night after eating a high salt dinner at [facility]. He is in normal rhythm today. He is being compliant with Tikosyn/ eliquis. No bleeding issues.  He is using cpap. He is back at [employer] on the machines with social distancing in place.   F/u in afib clinic, 10/26/29. He asked to be seen today as he will notice fluttering in his chest but his HR will be lower in the 40's. He did notice one episode of afib with HR's in the 150's x 1. He notices more shortness of breath when he has the fluttering/slow HR spells. No change in health. Eye drop Xiidra, added 2 weeks ago for dry eye. Continues on eliquis 5 mg bid for a CHA2DS2VASc score of 1.   F/u in the afib clinic, 5/3,  for f/u ER visit 4/7,  for chest pain. It was not felt to be cardiac in origin. He was in afib in the ER but converted. The  chest pain  was not associated with afib over the preceding days at home with same. Dr. Excell Seltzer saw on consult in the ER and cleared to go home. He wore a monitor that showed 7% afib burden. He feels the chest pain was from stress dealing with his job. 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 He is not an ablation candidate for his weight at 328 lbs today. He is   back on [facility] for his weight loss efforts. He lost 50 lbs one time doing [employer], but went on vacation and got off [employer] at that time.   F/u afib clinic, 12/12/20. He is in SR today. He has ongoing   atypical chest pain. Recent cardiac CT showed no significant coronary disease so doubt coronary origin. He tells me he has recently reduced his omeprazole to once a day and has noted citric food will make it worse. He has also increased coffee intake. He  denies ever having an endoscopy. He does lift heavy weights and another origin of pain may be MS. He often will notice on sitting to standing. He remains on tikosyn and eliquis.   Follow up in the AF clinic 08/27/21. Patient reports that he has done well since his last visit. He did have one episode of afib starting in the evening and lasting into the next day. He denies any bleeding issues on anticoagulation.   F/u in afib clinic, 11/22/21. Pt is here as he developed some lightheadedness with left arm feeling tingly, and he felt that he had some palpitations. Someone at work felt  his pulse and said he had some skips. He has noted at work if he lifts his arm above his head for work related activities he feels a tingle and a pull across his chest. He has been evaluated for atypical chest pain in the ER before without significant findings. A CT score was low and a LHC in 2018 did not show significant CAD.   He states that his HR's were in the 60-80's at work yesterday . He is in SR today and has no complaints. He states that he has had to pick  up another job as his hours have been cut back with his current job.   Follow up in the AF clinic 07/15/22. Patient reports that he has had more frequent afib recently. He states that he has an episode about every other day which can last for several hours. His wife was recently diagnosed with breast cancer and had surgery earlier this week. No bleeding issues on anticoagulation. He denies any dizziness or presyncope.   Follow up in the AF clinic 09/24/22. Patient was seen in the ED 09/20/22 with afib and chest pain. His symptoms improved with rate control. He converted back to SR after discharge and feels well today. He has been dealing with a sinus infection recently.   Follow up in the AF clinic 03/06/23. Patient is s/p afib ablation with Dr Elberta Fortis on 01/30/23. Patient reports that his afib burden on his Apple watch has decreased from 30% to 5%. He denies any ongoing  chest pain, swallowing pain, or groin issues.   Today, he denies symptoms of palpitations, shortness of breath, orthopnea, PND, lower extremity edema, dizziness, presyncope, syncope, or neurologic sequela. Positive for back pain.The patient is tolerating medications without difficulties and is otherwise without complaint today.   Past Medical History:  Diagnosis Date   Allergy    Arthritis    "knees" (01/22/2017)   Cataract    Chronic lower back pain    GERD (gastroesophageal reflux disease)    Gout    "no flares in awhile" (01/22/2017)   History of MRI of cervical spine 09/25/2010   normal; left shoulder normal as well   Hypertension    Migraine    "~ q 6 months" (01/22/2017)   Morbid obesity (HCC)    "I've lost 43# in 15 weeks w/weight watchers" (01/22/2017)   OSA on CPAP    Paroxysmal atrial fibrillation (HCC) 02/2014   chads2vasc score is 1    ROS- All systems are reviewed and negative except as per the HPI above  Physical Exam: Vitals:   03/06/23 1525  BP: 132/82  Pulse: 75  Weight: (!) 141 kg  Height: 6\' 2"  (1.88 m)    GEN: Well nourished, well developed in no acute distress NECK: No JVD; No carotid bruits CARDIAC: Regular rate and rhythm, no murmurs, rubs, gallops RESPIRATORY:  Clear to auscultation without rales, wheezing or rhonchi  ABDOMEN: Soft, non-tender, non-distended EXTREMITIES:  No edema; No deformity    ECG today demonstrates Ectopic atrial rhythm Vent. rate 75 BPM PR interval 142 ms QRS duration 98 ms QT/QTcB 396/442 ms  CHA2DS2-VASc Score = 1  The patient's score is based upon: CHF History: 0 HTN History: 1 Diabetes History: 0 Stroke History: 0 Vascular Disease History: 0 Age Score: 0 Gender Score: 0       ASSESSMENT AND PLAN: Paroxysmal Atrial Fibrillation (ICD10:  I48.0) The patient's CHA2DS2-VASc score is 1, indicating a 0.6% annual risk of stroke.   S/p afib ablation 01/30/23 Afib burden greatly reduced. Hopefully, will continue  to improve as he heals from the ablation.  Continue dofetilide 500 mg BID. QT stable. Continue Toprol 12.5 mg daily PRN for heart racing. Bradycardia has limited rate control in the past.  Continue Eliquis 5 mg BID  Obesity Body mass index is 39.9 kg/m.  Encouraged lifestyle modification  HTN Stable on current regimen  Chronic Atypical Chest pain CAC score 1.75 LHC in 2018, No significant CAD   OSA  Encouraged nightly CPAP   Follow up with Dr Elberta Fortis as scheduled.  Jorja Loa PA-C Afib Clinic [facility] [address] [place], [place] [address] [phone]

## 2023-03-23 ENCOUNTER — Other Ambulatory Visit: Payer: Self-pay | Admitting: Family Medicine

## 2023-03-25 ENCOUNTER — Encounter: Payer: Self-pay | Admitting: Student

## 2023-03-25 ENCOUNTER — Ambulatory Visit: Payer: 59 | Admitting: Student

## 2023-03-25 VITALS — BP 128/82 | HR 77 | Temp 97.8°F | Ht 74.0 in | Wt 312.6 lb

## 2023-03-25 DIAGNOSIS — R0981 Nasal congestion: Secondary | ICD-10-CM

## 2023-03-25 MED ORDER — SALINE SPRAY 0.65 % NA SOLN
2.0000 | NASAL | 0 refills | Status: AC | PRN
Start: 2023-03-25 — End: ?

## 2023-03-25 MED ORDER — AZELASTINE HCL 0.1 % NA SOLN
2.0000 | Freq: Two times a day (BID) | NASAL | 0 refills | Status: DC
Start: 2023-03-25 — End: 2023-09-29

## 2023-03-25 NOTE — Assessment & Plan Note (Signed)
Allergic rhinitis versus sinus infection.  Will treat as allergic reaction.  If symptoms are not improved by Friday, may be bacterial sinus infection and could consider antibiotics at that time.

## 2023-03-25 NOTE — Progress Notes (Signed)
    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  This occurred on Thursday of last week.  Since then he had increased nasal congestion, pruritic throat and nose.  No fevers, chills, night sweats, NVD, no cough, shortness of breath. No sick contacts. States he has a history of allergies and currently takes Allegra every day and as needed Flonase.  OBJECTIVE:   BP 128/82   Pulse 77   Temp 97.8 F (36.6 C) (Oral)   Ht 6\' 2"  (1.88 m)   Wt (!) 312 lb 9.6 oz (141.8 kg)   SpO2 97%   BMI 40.14 kg/m    General: NAD, pleasant HEENT: Normocephalic, atraumatic head. Normal external ear, canal, TM bilaterally. EOM intact and normal conjunctiva BL. Normal external nose. Erythematous and boggy nasal turbinates. Throat not erythematous, no exudate, no deviation.  Cardio: RRR, no MRG. Respiratory: CTAB, normal wob on RA Skin: Warm and dry  ASSESSMENT/PLAN:   Nasal congestion Assessment & Plan: Allergic rhinitis versus sinus infection.  Will treat as allergic reaction.  If symptoms are not improved by Friday, may be bacterial sinus infection and could consider antibiotics at that time.  Orders: -     Azelastine HCl; Place 2 sprays into both nostrils 2 (two) times daily. Use in each nostril as directed  Dispense: 30 mL; Refill: 0 -     Saline Spray; Place 2 sprays into both nostrils as needed for congestion.  Dispense: 50 mL; Refill: 0   Tiffany Kocher, DO [facility]

## 2023-03-25 NOTE — Patient Instructions (Addendum)
It was great to see you! Thank you for allowing me to participate in your care!   I recommend that you always bring your medications to each appointment as this makes it easy to ensure we are on the correct medications and helps [place] not miss when refills are needed.  Our plans for today:  - Use 2 sprays of azelastine twice daily, and nasal saline twice daily - Follow-up with Dr. Pollie Meyer on 8/27  Take care and seek immediate care sooner if you develop any concerns. Please remember to show up 15 minutes before your scheduled appointment time!  Tiffany Kocher, DO [facility]

## 2023-03-27 ENCOUNTER — Other Ambulatory Visit: Payer: Self-pay | Admitting: Family Medicine

## 2023-04-01 ENCOUNTER — Other Ambulatory Visit: Payer: Self-pay

## 2023-04-01 ENCOUNTER — Ambulatory Visit (INDEPENDENT_AMBULATORY_CARE_PROVIDER_SITE_OTHER): Payer: 59 | Admitting: Family Medicine

## 2023-04-01 ENCOUNTER — Telehealth: Payer: Self-pay | Admitting: Family Medicine

## 2023-04-01 VITALS — BP 136/87 | HR 76 | Ht 74.0 in | Wt 313.6 lb

## 2023-04-01 DIAGNOSIS — B9689 Other specified bacterial agents as the cause of diseases classified elsewhere: Secondary | ICD-10-CM | POA: Diagnosis not present

## 2023-04-01 DIAGNOSIS — G4733 Obstructive sleep apnea (adult) (pediatric): Secondary | ICD-10-CM

## 2023-04-01 DIAGNOSIS — J329 Chronic sinusitis, unspecified: Secondary | ICD-10-CM

## 2023-04-01 MED ORDER — DOXYCYCLINE HYCLATE 100 MG PO TABS
100.0000 mg | ORAL_TABLET | Freq: Two times a day (BID) | ORAL | 0 refills | Status: DC
Start: 2023-04-01 — End: 2023-05-29

## 2023-04-01 NOTE — Progress Notes (Signed)
  Date of Visit: 04/01/2023   SUBJECTIVE:   HPI:  [name] presents today for sinusitis and new CPAP order.  OSA: Compliant with nightly CPAP.  The machine is using is very old.  Has been using [facility] for home health services related to the CPAP.  They said he needs an order for a new machine.  Current setting is 4 cm of water.  Bacterial sinusitis: Has had sinus congestion for over 10 days.  No fevers.  Is producing yellow/green/bloody mucus.  Has tried over-the-counter conservative management without improvement.  Was told to return if symptoms persisted to get an antibiotic.  He does have a penicillin allergy.  OBJECTIVE:   BP 136/87   Pulse 76   Ht 6\' 2"  (1.88 m)   Wt (!) 313 lb 9.6 oz (142.2 kg)   SpO2 97%   BMI 40.26 kg/m  Gen: No acute distress, pleasant, cooperative, well-appearing HEENT: Normocephalic, atraumatic, TMs clear bilaterally, nares patent with injected turbinates and nasal sounding voice.  Oropharynx clear and moist.  No anterior cervical lymphadenopathy or supraclavicular lymphadenopathy Heart: Regular rate and rhythm, no murmur Lungs: Clear to auscultation bilaterally, normal effort Neuro: Grossly nonfocal, speech normal  ASSESSMENT/PLAN:   Health maintenance:  -Advised to return for flu and COVID updated boosters -Aware to go to pharmacy to get Shingrix  OSA on CPAP Placed order for new CPAP machine.  Will message RN team to handle contacting home health agency.  Sinusitis Symptoms consistent with bacterial sinusitis.  Warrants antibiotic therapy.  Given penicillin allergy, prescribed doxycycline for 7 days.  Discussed photosensitivity with this medication, advised wearing hat and sunscreen if out in the sun.  Follow-up if not improving.  [place] J. Pollie Meyer, MD [facility]

## 2023-04-01 NOTE — Patient Instructions (Signed)
It was great to see you again today.  Sent in antibiotic for you to use for sinus infection Wear sunscreen - can increase sensitivity to the sun  Ordered new CPAP, someone should be in touch with you about it  Be well, Dr. Pollie Meyer

## 2023-04-01 NOTE — Telephone Encounter (Signed)
Community message sent to [employer]. Will await response.   Hannah C Pipkin, RN  

## 2023-04-01 NOTE — Assessment & Plan Note (Signed)
Placed order for new CPAP machine.  Will message RN team to handle contacting home health agency.

## 2023-04-01 NOTE — Telephone Encounter (Signed)
Patient has need for DME. I have ordered CPAP. I am routing note for [facility] RN Pool.   Levert Feinstein, MD

## 2023-04-03 NOTE — Telephone Encounter (Signed)
Receipt confirmed by [employer].   Hannah C Pipkin, RN  

## 2023-04-06 ENCOUNTER — Other Ambulatory Visit: Payer: Self-pay | Admitting: Family Medicine

## 2023-04-23 ENCOUNTER — Encounter: Payer: Self-pay | Admitting: Physician Assistant

## 2023-04-23 ENCOUNTER — Ambulatory Visit (INDEPENDENT_AMBULATORY_CARE_PROVIDER_SITE_OTHER): Payer: 59 | Admitting: Physician Assistant

## 2023-04-23 ENCOUNTER — Other Ambulatory Visit (INDEPENDENT_AMBULATORY_CARE_PROVIDER_SITE_OTHER): Payer: 59

## 2023-04-23 DIAGNOSIS — M25561 Pain in right knee: Secondary | ICD-10-CM | POA: Diagnosis not present

## 2023-04-23 DIAGNOSIS — M1711 Unilateral primary osteoarthritis, right knee: Secondary | ICD-10-CM | POA: Diagnosis not present

## 2023-04-23 DIAGNOSIS — G8929 Other chronic pain: Secondary | ICD-10-CM

## 2023-04-23 MED ORDER — LIDOCAINE HCL 1 % IJ SOLN
2.0000 mL | INTRAMUSCULAR | Status: AC | PRN
Start: 2023-04-23 — End: 2023-04-23
  Administered 2023-04-23: 2 mL

## 2023-04-23 MED ORDER — METHYLPREDNISOLONE ACETATE 40 MG/ML IJ SUSP
80.0000 mg | INTRAMUSCULAR | Status: AC | PRN
Start: 2023-04-23 — End: 2023-04-23
  Administered 2023-04-23: 80 mg via INTRA_ARTICULAR

## 2023-04-23 MED ORDER — BUPIVACAINE HCL 0.25 % IJ SOLN
2.0000 mL | INTRAMUSCULAR | Status: AC | PRN
Start: 2023-04-23 — End: 2023-04-23
  Administered 2023-04-23: 2 mL via INTRA_ARTICULAR

## 2023-04-23 NOTE — Progress Notes (Signed)
Office Visit Note   Patient: [name]           Date of Birth: 1966/06/05           MRN: [id number] Visit Date: 04/23/2023              Requested by: Latrelle Dodrill, MD [address] [place],  [place] [address] PCP: Latrelle Dodrill, MD   Assessment & Plan: Visit Diagnoses:  1. Primary osteoarthritis of right knee     Plan: 57 year old gentleman with known history osteoarthritis right knee.  Irritated the knee at 3 weeks ago when he stopped down hard on it to regain his balance.  Did not have any direct fall on it.  Since then he has had some increasing pain.  Mostly patellofemoral he notices it going up and down stairs and also over the medial joint x-rays today do not demonstrate a fracture he does have significant degenerative changes.  We talked about close chain exercises for the patellofemoral joint we discussed trying an injection he has had those in the past would like to go forward with that today  Follow-Up Instructions: Return if symptoms worsen or fail to improve.   Orders:  No orders of the defined types were placed in this encounter.  No orders of the defined types were placed in this encounter.     Procedures: Large Joint Inj on 04/23/2023 1:52 PM Indications: pain and diagnostic evaluation Details: 25 G 1.5 in needle, anteromedial approach  Arthrogram: No  Medications: 80 mg methylPREDNISolone acetate 40 MG/ML; 2 mL lidocaine 1 %; 2 mL bupivacaine 0.25 % Outcome: tolerated well, no immediate complications Procedure, treatment alternatives, risks and benefits explained, specific risks discussed. Consent was given by the patient.     Clinical Data: No additional findings.   Subjective: No chief complaint on file.   HPI patient is a pleasant 57 year old gentleman with known history of right knee osteoarthritis.  He comes in today with a 3-week history of painful right knee when he stepped down hard and playing with his grandson.  Has had  aspirations and injections in the past.  Denies any fever chills rates his pain is moderate increased with weightbearing  Review of Systems  All other systems reviewed and are negative.    Objective: Vital Signs: There were no vitals taken for this visit.  Physical Exam Constitutional:      Appearance: Normal appearance.  Skin:    General: Skin is warm and dry.  Neurological:     Mental Status: He is alert.  Psychiatric:        Mood and Affect: Mood normal.        Behavior: Behavior normal.    Ortho Exam Right knee no effusion no erythema compartments are soft and nontender he is neurovascularly intact he has medial and patellofemoral joint line tenderness.  Good stability with anterior posterior draw no significant tenderness over the lateral joint line.  Good medial and lateral stability Specialty Comments:  No specialty comments available.  Imaging: No results found.   PMFS History: Patient Active Problem List   Diagnosis Date Noted  . Nasal congestion 03/25/2023  . Blepharitis of lower eyelids of both eyes 02/19/2023  . Kidney cysts 05/21/2022  . Left upper quadrant abdominal pain 05/21/2022  . Acute left ankle pain 10/25/2021  . Chronic left-sided low back pain without sciatica 09/05/2020  . Body mass index 40.0-44.9, adult (HCC) 02/10/2019  . Urinary incontinence 01/02/2018  . Left  groin pain 01/02/2018  . Allergic rhinitis 01/02/2018  . Healthcare maintenance 09/12/2017  . Acute nonintractable headache 02/03/2017  . Abnormal stress test   . Symptomatic sinus bradycardia   . Left sided numbness   . Muscle fasciculation   . Primary osteoarthritis of right knee 07/23/2016  . Plantar fasciitis of right foot 05/07/2016  . 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 Dizziness 03/21/2015  . Shortness of breath 03/21/2015  . OSA on CPAP   . PAF (paroxysmal atrial fibrillation)  (HCC)   . Palpitations   . Atypical chest pain 03/03/2015  . Atrial fibrillation (HCC) 03/14/2014  . Sinusitis 10/23/2012  . OSA (obstructive sleep apnea) 05/17/2011  . Chronic back pain 10/29/2010  . INSOMNIA 09/19/2008  . Gout 05/13/2007  . Hyperlipidemia 10/02/2006  . Morbid obesity (HCC) 10/02/2006  . Migraine 10/02/2006  . HYPERTENSION, BENIGN SYSTEMIC 10/02/2006  . GASTROESOPHAGEAL REFLUX, NO ESOPHAGITIS 10/02/2006   Past Medical History:  Diagnosis Date  . Allergy   . Arthritis    "knees" (01/22/2017)  . Cataract   . Chronic lower back pain   . GERD (gastroesophageal reflux disease)   . Gout    "no flares in awhile" (01/22/2017)  . History of MRI of cervical spine 09/25/2010   normal; left shoulder normal as well  . Hypertension   . Migraine    "~ q 6 months" (01/22/2017)  . Morbid obesity (HCC)    "I've lost 43# in 15 weeks w/weight watchers" (01/22/2017)  . OSA on CPAP   . Paroxysmal atrial fibrillation (HCC) 02/2014   chads2vasc score is 1    Family History  Problem Relation Age of Onset  . Atrial fibrillation Mother 38       had a stroke  . Hypertension Father   . Diabetes Father   . Colon cancer Neg Hx   . Esophageal cancer Neg Hx   . Stomach cancer Neg Hx     Past Surgical History:  Procedure Laterality Date  . ATRIAL FIBRILLATION ABLATION N/A 01/30/2023   Procedure: ATRIAL FIBRILLATION ABLATION;  Surgeon: Regan Lemming, MD;  Location: [facility] INVASIVE CV LAB;  Service: Cardiovascular;  Laterality: N/A;  . CARDIAC CATHETERIZATION  05/2005   "no blockage"  . KNEE ARTHROSCOPY Right 02/2013  . LEFT HEART CATH AND CORONARY ANGIOGRAPHY N/A 01/27/2017   Procedure: Left Heart Cath and Coronary Angiography;  Surgeon: Lennette Bihari, MD;  Location: [facility] INVASIVE CV LAB;  Service: Cardiovascular;  Laterality: N/A;   Social History   Occupational History  . Not on file  Tobacco Use  . Smoking status: Former    Current packs/day: 0.00    Average packs/day: 1  pack/day for 10.0 years (10.0 ttl pk-yrs)    Types: Cigarettes    Start date: 04/02/1984    Quit date: 04/02/1994    Years since quitting: 29.0  . Smokeless tobacco: Former    Types: Chew  . Tobacco comments:    Former smoker and chew 07/15/22  Vaping Use  . Vaping status: Never Used  Substance and Sexual Activity  . Alcohol use: No    Alcohol/week: 0.0 standard drinks of alcohol    Comment: "stopped in 1994"  . Drug use: Not Currently    Types: Marijuana    Comment: "stopped marijuna in 1994"  . Sexual activity: Yes

## 2023-05-09 ENCOUNTER — Encounter: Payer: Self-pay | Admitting: Cardiology

## 2023-05-09 ENCOUNTER — Ambulatory Visit: Payer: 59 | Attending: Cardiology | Admitting: Cardiology

## 2023-05-09 VITALS — BP 130/88 | HR 72 | Ht 74.0 in | Wt 312.4 lb

## 2023-05-09 DIAGNOSIS — I4819 Other persistent atrial fibrillation: Secondary | ICD-10-CM

## 2023-05-09 DIAGNOSIS — Z79899 Other long term (current) drug therapy: Secondary | ICD-10-CM | POA: Diagnosis not present

## 2023-05-09 NOTE — Patient Instructions (Addendum)
Medication Instructions:  Your physician recommends that you continue on your current medications as directed. Please refer to the Current Medication list given to you today.  *If you need a refill on your cardiac medications before your next appointment, please call your pharmacy*   Lab Work: Tikosyn surveillance lab today: Magnesium level If you have labs (blood work) drawn today and your tests are completely normal, you will receive your results only by: MyChart Message (if you have MyChart) OR A paper copy in the mail If you have any lab test that is abnormal or we need to change your treatment, we will call you to review the results.   Testing/Procedures: None ordered   Follow-Up: At [facility], you and your health needs are our priority.  As part of our continuing mission to provide you with exceptional heart care, we have created designated Provider Care Teams.  These Care Teams include your primary Cardiologist (physician) and Advanced Practice Providers (APPs -  Physician Assistants and Nurse Practitioners) who all work together to provide you with the care you need, when you need it.  Your next appointment:   6 month(s)  The format for your next appointment:   In Person  Provider:   You will follow up in the Atrial Fibrillation Clinic located at [facility]. Your provider will be: Clint R. Fenton, PA-C or Lake Bells, PA-C    Thank you for choosing [facility]!!   Dory Horn, RN [phone]

## 2023-05-09 NOTE — Progress Notes (Signed)
Electrophysiology Office Note:   Date:  05/09/2023  ID:  [name], DOB 10-Mar-1966, MRN [id number]  Primary Cardiologist: Kristeen Miss, MD Electrophysiologist: Regan Lemming, MD      History of Present Illness:   [name] is a 57 y.o. male with h/o atrial fibrillation post ablation 01/30/2023, sleep apnea on CPAP, hypertension, obesity seen today for routine electrophysiology followup.   Since last being seen in our clinic the patient reports doing well.  He has noted no further episodes of atrial fibrillation since his ablation.  He has been able to do all of his daily activities.  He tried exercise in the few weeks immediately after his ablation but developed chest pain.  He is now walking quite vigorously and has no further chest pain.  Additionally, he had 1 episode of atrial fibrillation after ablation in the first week, but has had no further episodes.  he denies chest pain, palpitations, dyspnea, PND, orthopnea, nausea, vomiting, dizziness, syncope, edema, weight gain, or early satiety.   Discussed the use of AI scribe software for clinical note transcription with the patient, who gave verbal consent to proceed.  History of Present Illness          Review of systems complete and found to be negative unless listed in HPI.   EP Information / Studies Reviewed:    EKG is ordered today. Personal review as below.  EKG Interpretation Date/Time:  Friday May 09 2023 11:22:37 EDT Ventricular Rate:  72 PR Interval:  150 QRS Duration:  96 QT Interval:  406 QTC Calculation: 444 R Axis:   -2  Text Interpretation: Unusual P axis, possible ectopic atrial rhythm When compared with ECG of 06-Mar-2023 15:34, No significant change was found Confirmed by Tyrhonda Georgiades ([id number]) on 05/09/2023 11:24:20 AM     Risk Assessment/Calculations:    CHA2DS2-VASc Score = 1   This indicates a 0.6% annual risk of stroke. The patient's score is based upon: CHF History: 0 HTN History:  1 Diabetes History: 0 Stroke History: 0 Vascular Disease History: 0 Age Score: 0 Gender Score: 0              Physical Exam:   VS:  BP 130/88 (BP Location: Right Arm, Patient Position: Sitting, Cuff Size: Large)   Pulse 72   Ht 6\' 2"  (1.88 m)   Wt (!) 312 lb 6.4 oz (141.7 kg)   SpO2 97%   BMI 40.11 kg/m    Wt Readings from Last 3 Encounters:  05/09/23 (!) 312 lb 6.4 oz (141.7 kg)  04/01/23 (!) 313 lb 9.6 oz (142.2 kg)  03/25/23 (!) 312 lb 9.6 oz (141.8 kg)     GEN: Well nourished, well developed in no acute distress NECK: No JVD; No carotid bruits CARDIAC: Regular rate and rhythm, no murmurs, rubs, gallops RESPIRATORY:  Clear to auscultation without rales, wheezing or rhonchi  ABDOMEN: Soft, non-tender, non-distended EXTREMITIES:  No edema; No deformity   ASSESSMENT AND PLAN:    1.  Paroxysmal atrial fibrillation: Currently on dofetilide and metoprolol.  Post ablation 01/30/2023.  He remains in sinus rhythm.  Happy with his control.  2.  Obesity: Lifestyle modification encouraged  3.  Hypertension: Currently well-controlled  4.  Obstructive sleep apnea: CPAP compliance encouraged  5.  High risk medication monitoring: Currently on dofetilide.  [place] check magnesium today  Follow up with Afib Clinic in 6 months  Signed, Khyra Viscuso Jorja Loa, MD

## 2023-05-10 LAB — MAGNESIUM: Magnesium: 2.1 mg/dL (ref 1.6–2.3)

## 2023-05-16 ENCOUNTER — Other Ambulatory Visit (HOSPITAL_COMMUNITY): Payer: Self-pay | Admitting: Physician Assistant

## 2023-05-27 ENCOUNTER — Other Ambulatory Visit: Payer: Self-pay | Admitting: Cardiology

## 2023-05-27 ENCOUNTER — Other Ambulatory Visit: Payer: Self-pay | Admitting: Family Medicine

## 2023-05-29 ENCOUNTER — Encounter: Payer: Self-pay | Admitting: Physician Assistant

## 2023-05-29 ENCOUNTER — Ambulatory Visit: Payer: 59 | Admitting: Physician Assistant

## 2023-05-29 VITALS — BP 116/80 | HR 95 | Ht 74.0 in | Wt 311.0 lb

## 2023-05-29 DIAGNOSIS — K219 Gastro-esophageal reflux disease without esophagitis: Secondary | ICD-10-CM

## 2023-05-29 DIAGNOSIS — Z860101 Personal history of adenomatous and serrated colon polyps: Secondary | ICD-10-CM | POA: Diagnosis not present

## 2023-05-29 MED ORDER — OMEPRAZOLE 40 MG PO CPDR: 40.0000 mg | DELAYED_RELEASE_CAPSULE | Freq: Every day | ORAL | 4 refills | Status: DC

## 2023-05-29 NOTE — Progress Notes (Signed)
Subjective:    Patient ID: [name], male    DOB: 1966/04/08, 57 y.o.   MRN: [id number]  HPI  [name] is a pleasant 57 year old white male, previous patient of Dr. Orvan Falconer who comes in today for medication refill.  He has been on omeprazole 40 mg p.o. every morning for chronic GERD.  He relates that he had been on twice daily dosing initially after he had undergone workup in 2022 but was able to scale back to once daily.  He does have occasional breakthrough symptoms but this is usually secondary to dietary indiscretion.  He is not having any nocturnal symptoms that he is aware of.  No complaints of dysphagia or odynophagia. No complaints of abdominal pain, no changes in bowel habits, no melena or hematochezia.  He did undergo EGD and colonoscopy in August 2022.  At EGD he was noted to have a normal-appearing esophagus and normal stomach.  Biopsies were taken from the GE junction and were consistent with GERD, no Barrett's, no EOE.  gastric biopsies with mild chronic gastritis, no H. pylori, duodenal biopsies were negative. At colonoscopy he had nonbleeding internal hemorrhoids and a total of 5 polyps removed, the largest was 10 mm and all were tubular adenomatous polyps.  He is indicated for 3-year interval follow-up.  The medical problems include hypertension, morbid obesity with BMI of 39.3, sleep apnea with CPAP use, atrial fibrillation status post ablation, he is currently on Tikosyn and Eliquis.  Also with degenerative joint disease and migraines.   Review of Systems Pertinent positive and negative review of systems were noted in the above HPI section.  All other review of systems was otherwise negative.   Outpatient Encounter Medications as of 05/29/2023  Medication Sig   apixaban (ELIQUIS) 5 MG TABS tablet Take 1 tablet (5 mg total) by mouth 2 (two) times daily.   atorvastatin (LIPITOR) 20 MG tablet TAKE 1 TABLET BY MOUTH EVERY OTHER DAY   azelastine (ASTELIN) 0.1 % nasal spray Place 2  sprays into both nostrils 2 (two) times daily. Use in each nostril as directed   baclofen (LIORESAL) 10 MG tablet TAKE 1/2 (ONE-HALF) TABLET BY MOUTH ONCE DAILY AS NEEDED FOR MUSCLE SPASM   diclofenac Sodium (VOLTAREN) 1 % GEL Apply 2 g topically daily as needed (For pain).   dofetilide (TIKOSYN) 500 MCG capsule TAKE 1 CAPSULE BY MOUTH 2 TIMES DAILY.   fexofenadine (ALLEGRA) 60 MG tablet TAKE 1 TABLET BY MOUTH DAILY   fluticasone (FLONASE) 50 MCG/ACT nasal spray Place 2 sprays into both nostrils daily. (Patient taking differently: Place 2 sprays into both nostrils daily as needed for allergies.)   gabapentin (NEURONTIN) 300 MG capsule Take 300 mg by mouth daily as needed (pain.).   losartan (COZAAR) 50 MG tablet Take 1 tablet by mouth once daily   metoprolol succinate (TOPROL-XL) 25 MG 24 hr tablet Take 1/2 tablet by mouth as needed for A-fib   MITIGARE 0.6 MG CAPS Take 1 capsule by mouth once daily   Multiple Vitamin (MULTIVITAMIN WITH MINERALS) TABS tablet Take 1 tablet by mouth at bedtime.   Omega-3 Fatty Acids (FISH OIL) 1000 MG CAPS Take 1,000 mg by mouth in the morning.   potassium chloride (KLOR-CON M) 10 MEQ tablet Take 1 tablet by mouth once daily (Patient taking differently: Take 10 mEq by mouth at bedtime.)   sodium chloride (OCEAN) 0.65 % SOLN nasal spray Place 2 sprays into both nostrils as needed for congestion.   TURMERIC PO Take 1  capsule by mouth every other day. In the morning.   omeprazole (PRILOSEC) 40 MG capsule Take 1 capsule (40 mg total) by mouth daily before breakfast.   [DISCONTINUED] doxycycline (VIBRA-TABS) 100 MG tablet Take 1 tablet (100 mg total) by mouth 2 (two) times daily.   [DISCONTINUED] omeprazole (PRILOSEC) 40 MG capsule Take 1 capsule (40 mg total) by mouth daily.   No facility-administered encounter medications on file as of 05/29/2023.   Allergies  Allergen Reactions   Hydrochlorothiazide-Propranolol [Propranolol-Hctz] Other (See Comments)    Joints  lock up   Penicillins Other (See Comments)    Hives Makes joints stiff and unable to move Has patient had a PCN reaction causing immediate rash, facial/tongue/throat swelling, SOB or lightheadedness with hypotension: No Has patient had a PCN reaction causing severe rash involving mucus membranes or skin necrosis: No Has patient had a PCN reaction that required hospitalization No Has patient had a PCN reaction occurring within the last 10 years: No If all of the above answers are "NO", then may proceed with Cephalosporin use.   Hydrochlorothiazide    Patient Active Problem List   Diagnosis Date Noted   Nasal congestion 03/25/2023   Blepharitis of lower eyelids of both eyes 02/19/2023   Kidney cysts 05/21/2022   Left upper quadrant abdominal pain 05/21/2022   Acute left ankle pain 10/25/2021   Chronic left-sided low back pain without sciatica 09/05/2020   Body mass index 40.0-44.9, adult (HCC) 02/10/2019   Urinary incontinence 01/02/2018   Left groin pain 01/02/2018   Allergic rhinitis 01/02/2018   Healthcare maintenance 09/12/2017   Acute nonintractable headache 02/03/2017   Abnormal stress test    Symptomatic sinus bradycardia    Left sided numbness    Muscle fasciculation    Primary osteoarthritis of right knee 07/23/2016   Plantar fasciitis of right foot 05/07/2016   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/05/2016   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   Dizziness 03/21/2015   Shortness of breath 03/21/2015   OSA on CPAP    PAF (paroxysmal atrial fibrillation) (HCC)    Palpitations    Atypical chest pain 03/03/2015   Atrial fibrillation (HCC) 03/14/2014   Sinusitis 10/23/2012   OSA (obstructive sleep apnea) 05/17/2011   Chronic back pain 10/29/2010   INSOMNIA 09/19/2008   Gout 05/13/2007   Hyperlipidemia 10/02/2006   Morbid obesity (HCC) 10/02/2006   Migraine 10/02/2006   HYPERTENSION, BENIGN SYSTEMIC 10/02/2006    GASTROESOPHAGEAL REFLUX, NO ESOPHAGITIS 10/02/2006   Social History   Socioeconomic History   Marital status: Married    Spouse name: Not on file   Number of children: 3   Years of education: Not on file   Highest education level: 12th grade  Occupational History   Not on file  Tobacco Use   Smoking status: Former    Current packs/day: 0.00    Average packs/day: 1 pack/day for 10.0 years (10.0 ttl pk-yrs)    Types: Cigarettes    Start date: 04/02/1984    Quit date: 04/02/1994    Years since quitting: 29.1   Smokeless tobacco: Former    Types: Chew   Tobacco comments:    Former smoker and chew 07/15/22  Vaping Use   Vaping status: Never Used  Substance and Sexual Activity   Alcohol use: No    Alcohol/week: 0.0 standard drinks of alcohol    Comment: "stopped in 1994"   Drug use: Not Currently    Types: Marijuana  Comment: "stopped marijuna in 1994"   Sexual activity: Yes  Other Topics Concern   Not on file  Social History Narrative   Lives in [place] with spouse.  3 children, all grown.   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   Social Determinants of Health   Financial Resource Strain: Low Risk  (04/01/2023)   Overall Financial Resource Strain (CARDIA)    Difficulty of Paying Living Expenses: Not very hard  Food Insecurity: No Food Insecurity (04/01/2023)   Hunger Vital Sign    Worried About Running Out of Food in the Last Year: Never true    Ran Out of Food in the Last Year: Never true  Transportation Needs: No Transportation Needs (04/01/2023)   PRAPARE - Administrator, Civil Service (Medical): No    Lack of Transportation (Non-Medical): No  Physical Activity: Insufficiently Active (04/01/2023)   Exercise Vital Sign    Days of Exercise per Week: 2 days    Minutes of Exercise per Session: 60 min  Stress: Not on file  Social Connections: Moderately Integrated (04/01/2023)   Social Connection and Isolation Panel [NHANES]     Frequency of Communication with Friends and Family: Twice a week    Frequency of Social Gatherings with Friends and Family: Twice a week    Attends Religious Services: More than 4 times per year    Active Member of [employer] or Organizations: No    Attends Engineer, structural: Not on file    Marital Status: Married  Catering manager Violence: Not on file    Mr. Santino's family history includes Atrial fibrillation (age of onset: 58) in his mother; Diabetes in his father; Hypertension in his father.      Objective:    Vitals:   05/29/23 1508  BP: 116/80  Pulse: 95    Physical Exam. Well-developed obese WM in no acute distress.  Height, Weight,311 BMI 39.93  HEENT; nontraumatic normocephalic, EOMI, PE R LA, sclera anicteric. Oropharynx;not examiined Neck; supple, no JVD Cardiovascular; regular rate and rhythm with S1-S2, no murmur rub or gallop Pulmonary; Clear bilaterally Abdomen; soft,obese  nontender, nondistended, no palpable mass or hepatosplenomegaly, bowel sounds are active Rectal;not done Skin; benign exam, no jaundice rash or appreciable lesions Extremities; no clubbing cyanosis or edema skin warm and dry Neuro/Psych; alert and oriented x4, grossly nonfocal mood and affect appropriate        Assessment & Plan:   #19 57 year old white male with chronic GERD-stable on omeprazole 40 mg p.o. every morning Occasional breakthrough symptoms usually related to dietary indiscretion, no nocturnal symptoms, no dysphagia or dyne aphasia  Last EGD August 2022  #2 history of adenomatous colon polyps multiple-last colonoscopy August 2022 and indicated for 3-year interval follow-up/August 2025  #3 diverticulosis sigmoid colon #4.  Internal hemorrhoids #5.  Morbid obesity #6.  Chronic anticoagulation-on Eliquis #7.  Atrial fibrillation #8  Hypertension #9  Sleep apnea with CPAP use #10 Osteoarthritis  Plan; Refill omeprazole 40 mg p.o. every morning AC breakfast #90 and  4 refills Reviewed antireflux regimen We discussed timing of follow-up colonoscopy.  Patient will be established with Dr. Leonides Schanz.  I have asked him to make a follow-up appointment with me in May or June 2025, and will plan to get him scheduled for follow-up colonoscopy at that time.  Corra Kaine S Horton [place] PA-C 05/29/2023   Cc: Latrelle Dodrill, MD

## 2023-05-29 NOTE — Patient Instructions (Signed)
We have sent the following medications to your pharmacy for you to pick up at your convenience: Prilosec  We are providing you with GERD diet information to read.  Please follow up with [place] in May/June 2025. We will set up a colonoscopy then.   I appreciate the opportunity to care for you. Amy Esterwood, PA-C

## 2023-05-30 NOTE — Progress Notes (Signed)
I agree with the assessment and plan as outlined by Ms. Esterwood. ?

## 2023-06-18 ENCOUNTER — Other Ambulatory Visit (HOSPITAL_COMMUNITY): Payer: Self-pay | Admitting: Physician Assistant

## 2023-06-18 ENCOUNTER — Other Ambulatory Visit: Payer: Self-pay | Admitting: Family Medicine

## 2023-09-24 ENCOUNTER — Other Ambulatory Visit: Payer: Self-pay | Admitting: Family Medicine

## 2023-09-29 ENCOUNTER — Encounter: Payer: Self-pay | Admitting: Family Medicine

## 2023-09-29 ENCOUNTER — Ambulatory Visit: Payer: 59 | Admitting: Family Medicine

## 2023-09-29 VITALS — BP 137/89 | HR 80 | Ht 74.0 in | Wt 314.6 lb

## 2023-09-29 DIAGNOSIS — Z23 Encounter for immunization: Secondary | ICD-10-CM | POA: Diagnosis not present

## 2023-09-29 DIAGNOSIS — Z0000 Encounter for general adult medical examination without abnormal findings: Secondary | ICD-10-CM | POA: Diagnosis not present

## 2023-09-29 DIAGNOSIS — G4733 Obstructive sleep apnea (adult) (pediatric): Secondary | ICD-10-CM

## 2023-09-29 DIAGNOSIS — R438 Other disturbances of smell and taste: Secondary | ICD-10-CM

## 2023-09-29 DIAGNOSIS — M109 Gout, unspecified: Secondary | ICD-10-CM

## 2023-09-29 DIAGNOSIS — R21 Rash and other nonspecific skin eruption: Secondary | ICD-10-CM

## 2023-09-29 MED ORDER — MITIGARE 0.6 MG PO CAPS: 1.0000 | ORAL_CAPSULE | Freq: Every day | ORAL | 3 refills | Status: AC

## 2023-09-29 MED ORDER — CLOTRIMAZOLE 1 % EX CREA: 1.0000 | TOPICAL_CREAM | Freq: Two times a day (BID) | CUTANEOUS | 0 refills | Status: AC

## 2023-09-29 NOTE — Patient Instructions (Addendum)
 It was great to see you again today.  Refilled colchicine  Call Dr. Avel Sensor office to schedule a visit with him about your smell issues [phone]  Sent in cream for the rash  Flu shot today  Be well, Dr. Pollie Meyer

## 2023-09-29 NOTE — Progress Notes (Unsigned)
  Date of Visit: 09/29/2023   SUBJECTIVE:   HPI:  [name] presents today for several issues:  Loss of smell:  A few months ago had exposure to a powder at work in bed of truck that caused him trouble breathing. Ever since then has had change in sense of smell. Gasoline and tires now smell rotten to him. Normal smells he now can't smell. Cannot smell bleach. No recent COVID infections. Previously saw Dr. Suszanne Conners years ago for ENT issue but has not seen him recently.   Rash on chest - has had rash on chest for a while. Mildly itchy. Seems to be spreading.  CPAP - using nightly, tolerating well  Gout - taking colchicine 0.6mg  daily. No recent gout flares. Needs colchicine refilled.   OBJECTIVE:   BP 137/89   Pulse 80   Ht 6\' 2"  (1.88 m)   Wt (!) 314 lb 9.6 oz (142.7 kg)   SpO2 97%   BMI 40.39 kg/m  Gen: no acute distress, pleasant cooperative HEENT: normocephalic, atraumatic. Oropharynx clear and moist. Tympanic membranes clear bilaterally. Nares patent. No anterior cervical or supraclavicular lymphadenopathy.  Heart: regular rate and rhythm, no murmur Lungs: clear to auscultation bilaterally, normal work of breathing  Neuro: alert, speech normal, grossly nonfocal Skin: slight annular appearing erythematous rash on anterior chest    ASSESSMENT/PLAN:   Assessment & Plan Decreased sense of smell Unclear cause, perhaps some degree of tissue damage related to chemical inhalation at work? Recommend he sees ENT for further evaluation, he will call Dr. Avel Sensor office to schedule Gout, unspecified cause, unspecified chronicity, unspecified site Doing well, refill colchicine 0.6mg  daily OSA on CPAP Stable, continue CPAP nightly Routine adult health maintenance Flu shot today Declines COVID booster today Skin rash Suspect possibly tinea corporis. Rx'd clotrimazole, follow up if not improving    [place] J. Pollie Meyer, MD [facility]

## 2023-10-01 NOTE — Assessment & Plan Note (Signed)
 Doing well, refill colchicine 0.6mg  daily

## 2023-10-01 NOTE — Assessment & Plan Note (Signed)
Stable, continue CPAP nightly

## 2023-10-01 NOTE — Assessment & Plan Note (Signed)
 Flu shot today Declines COVID booster today

## 2023-11-05 ENCOUNTER — Ambulatory Visit (HOSPITAL_COMMUNITY)
Admission: RE | Admit: 2023-11-05 | Discharge: 2023-11-05 | Disposition: A | Discharge status: Home / Self Care | Payer: 59 | Source: Ambulatory Visit | Attending: Physician Assistant | Admitting: Physician Assistant

## 2023-11-05 ENCOUNTER — Encounter (HOSPITAL_COMMUNITY): Payer: Self-pay | Admitting: Physician Assistant

## 2023-11-05 VITALS — BP 118/82 | HR 79 | Ht 74.0 in | Wt 315.0 lb

## 2023-11-05 DIAGNOSIS — Z79899 Other long term (current) drug therapy: Secondary | ICD-10-CM | POA: Diagnosis not present

## 2023-11-05 DIAGNOSIS — I480 Paroxysmal atrial fibrillation: Secondary | ICD-10-CM | POA: Insufficient documentation

## 2023-11-05 DIAGNOSIS — Z5181 Encounter for therapeutic drug level monitoring: Secondary | ICD-10-CM | POA: Diagnosis not present

## 2023-11-05 DIAGNOSIS — G4733 Obstructive sleep apnea (adult) (pediatric): Secondary | ICD-10-CM | POA: Diagnosis not present

## 2023-11-05 DIAGNOSIS — Z7901 Long term (current) use of anticoagulants: Secondary | ICD-10-CM | POA: Diagnosis present

## 2023-11-05 DIAGNOSIS — I10 Essential (primary) hypertension: Secondary | ICD-10-CM | POA: Diagnosis not present

## 2023-11-05 DIAGNOSIS — Z6841 Body Mass Index (BMI) 40.0 and over, adult: Secondary | ICD-10-CM | POA: Insufficient documentation

## 2023-11-05 LAB — BASIC METABOLIC PANEL WITH GFR
Anion gap: 8 (ref 5–15)
BUN: 12 mg/dL (ref 6–20)
CO2: 24 mmol/L (ref 22–32)
Calcium: 9.1 mg/dL (ref 8.9–10.3)
Chloride: 107 mmol/L (ref 98–111)
Creatinine, Ser: 0.89 mg/dL (ref 0.61–1.24)
GFR, Estimated: >60 mL/min (ref >60)
Glucose, Bld: 110 mg/dL — ABNORMAL HIGH (ref 70–99)
Potassium: 3.7 mmol/L (ref 3.5–5.1)
Sodium: 139 mmol/L (ref 135–145)

## 2023-11-05 LAB — MAGNESIUM: Magnesium: 2 mg/dL (ref 1.7–2.4)

## 2023-11-05 NOTE — Progress Notes (Signed)
 Primary Care Physician: Latrelle Dodrill, MD Referring Physician: Dr. Johney Frame, [facility] f/u Cardiologist: Dr. Elease Hashimoto Primary EP: Dr Philis Nettle is a 58 y.o. male with a h/o PAF on  tikosyn. He feels that mostly he is staying in SR. He did have afib last night after eating a high salt dinner at [facility]. He is in normal rhythm today. He is being compliant with Tikosyn/ eliquis. No bleeding issues.  He is using cpap. He is back at [employer] on the machines with social distancing in place.   F/u in afib clinic, 10/26/29. He asked to be seen today as he will notice fluttering in his chest but his HR will be lower in the 40's. He did notice one episode of afib with HR's in the 150's x 1. He notices more shortness of breath when he has the fluttering/slow HR spells. No change in health. Eye drop Xiidra, added 2 weeks ago for dry eye. Continues on eliquis 5 mg bid for a CHA2DS2VASc score of 1.   F/u in the afib clinic, 5/3,  for f/u ER visit 4/7,  for chest pain. It was not felt to be cardiac in origin. He was in afib in the ER but converted. The  chest pain  was not associated with afib over the preceding days at home with same. Dr. Excell Seltzer saw on consult in the ER and cleared to go home. He wore a monitor that showed 7% afib burden. He feels the chest pain was from stress dealing with his job. 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 He is not an ablation candidate for his weight at 328 lbs today. He is   back on [facility] for his weight loss efforts. He lost 50 lbs one time doing [employer], but went on vacation and got off [employer] at that time.   F/u afib clinic, 12/12/20. He is in SR today. He has ongoing   atypical chest pain. Recent cardiac CT showed no significant coronary disease so doubt coronary origin. He tells me he has recently reduced his omeprazole to once a day and has noted citric food will make it worse. He has also increased coffee intake. He  denies ever having an endoscopy. He does lift heavy weights and another origin of pain may be MS. He often will notice on sitting to standing. He remains on tikosyn and eliquis.   Follow up in the AF clinic 08/27/21. Patient reports that he has done well since his last visit. He did have one episode of afib starting in the evening and lasting into the next day. He denies any bleeding issues on anticoagulation.   F/u in afib clinic, 11/22/21. Pt is here as he developed some lightheadedness with left arm feeling tingly, and he felt that he had some palpitations. Someone at work felt  his pulse and said he had some skips. He has noted at work if he lifts his arm above his head for work related activities he feels a tingle and a pull across his chest. He has been evaluated for atypical chest pain in the ER before without significant findings. A CT score was low and a LHC in 2018 did not show significant CAD.   He states that his HR's were in the 60-80's at work yesterday . He is in SR today and has no complaints. He states that he has had to pick  up another job as his hours have been cut back with his current job.   Follow up in the AF clinic 07/15/22. Patient reports that he has had more frequent afib recently. He states that he has an episode about every other day which can last for several hours. His wife was recently diagnosed with breast cancer and had surgery earlier this week. No bleeding issues on anticoagulation. He denies any dizziness or presyncope.   Follow up in the AF clinic 09/24/22. Patient was seen in the ED 09/20/22 with afib and chest pain. His symptoms improved with rate control. He converted back to SR after discharge and feels well today. He has been dealing with a sinus infection recently.   Follow up in the AF clinic 03/06/23. Patient is s/p afib ablation with Dr Elberta Fortis on 01/30/23. Patient reports that his afib burden on his Apple watch has decreased from 30% to 5%. He denies any ongoing  chest pain, swallowing pain, or groin issues.   Follow up 11/05/23. Patient returns for follow up for atrial fibrillation and dofetilide monitoring. Patient reports that he has done well since his last visit. He will occasionally have a "flutter" in his chest associated with eating a large meal but these are brief and infrequent. No bleeding issues on anticoagulation.    Today, he  denies symptoms of chest pain, shortness of breath, orthopnea, PND, lower extremity edema, dizziness, presyncope, syncope, snoring, daytime somnolence, bleeding, or neurologic sequela. The patient is tolerating medications without difficulties and is otherwise without complaint today.    Past Medical History:  Diagnosis Date   Allergy    Arthritis    "knees" (01/22/2017)   Cataract    Chronic lower back pain    GERD (gastroesophageal reflux disease)    Gout    "no flares in awhile" (01/22/2017)   History of MRI of cervical spine 09/25/2010   normal; left shoulder normal as well   Hypertension    Migraine    "~ q 6 months" (01/22/2017)   Morbid obesity (HCC)    "I've lost 43# in 15 weeks w/weight watchers" (01/22/2017)   OSA on CPAP    Paroxysmal atrial fibrillation (HCC) 02/2014   chads2vasc score is 1    ROS- All systems are reviewed and negative except as per the HPI above  Physical Exam: Vitals:   11/05/23 1443  BP: 118/82  Pulse: 79  Weight: (!) 142.9 kg  Height: 6\' 2"  (1.88 m)     GEN: Well nourished, well developed in no acute distress CARDIAC: Regular rate and rhythm, no murmurs, rubs, gallops RESPIRATORY:  Clear to auscultation without rales, wheezing or rhonchi  ABDOMEN: Soft, non-tender, non-distended EXTREMITIES:  No edema; No deformity    ECG today demonstrates SR vs ectopic atrial rhythm Vent. rate 79 BPM PR interval 124 ms QRS duration 92 ms QT/QTcB 386/442 ms   CHA2DS2-VASc Score = 1  The patient's score is based upon: CHF History: 0 HTN History: 1 Diabetes History:  0 Stroke History: 0 Vascular Disease History: 0 Age Score: 0 Gender Score: 0       ASSESSMENT AND PLAN: Paroxysmal Atrial Fibrillation (ICD10:  I48.0) The patient's CHA2DS2-VASc score is 1, indicating a 0.6% annual risk of stroke.   S/p afib ablation 01/30/23 Patient appears to be maintaining SR Continue dofetilide 500 mcg BID Continue Toprol 12.5 mg daily PRN for heart racing. He has become bradycardic on scheduled dosing in the past.  Continue Eliquis 5 mg BID  High  Risk Medication Monitoring (ICD 10: Z79.899) QT interval on ECG acceptable for dofetilide monitoring. Check bmet/mag today.      Obesity Body mass index is 40.44 kg/m.  Encouraged lifestyle modification  HTN Stable on current regimen  OSA  Encouraged nightly CPAP   Follow up in the AF clinic in 6 months.    Jorja Loa PA-C Afib Clinic [facility] [address] [place], [place] [address] [phone]

## 2023-11-07 ENCOUNTER — Ambulatory Visit (HOSPITAL_COMMUNITY): Payer: 59 | Admitting: Physician Assistant

## 2023-11-07 ENCOUNTER — Encounter (HOSPITAL_COMMUNITY): Payer: Self-pay

## 2023-11-25 ENCOUNTER — Other Ambulatory Visit: Payer: Self-pay | Admitting: Neurosurgery

## 2023-11-25 DIAGNOSIS — M5416 Radiculopathy, lumbar region: Secondary | ICD-10-CM

## 2023-12-07 ENCOUNTER — Ambulatory Visit
Admission: RE | Admit: 2023-12-07 | Discharge: 2023-12-07 | Disposition: A | Discharge status: Home / Self Care | Source: Ambulatory Visit | Attending: Neurosurgery | Admitting: Neurosurgery

## 2023-12-07 ENCOUNTER — Other Ambulatory Visit (HOSPITAL_COMMUNITY): Payer: Self-pay | Admitting: Physician Assistant

## 2023-12-07 DIAGNOSIS — M5416 Radiculopathy, lumbar region: Secondary | ICD-10-CM

## 2023-12-28 ENCOUNTER — Ambulatory Visit (INDEPENDENT_AMBULATORY_CARE_PROVIDER_SITE_OTHER)

## 2023-12-28 ENCOUNTER — Ambulatory Visit
Admission: EM | Admit: 2023-12-28 | Discharge: 2023-12-28 | Disposition: A | Discharge status: Home / Self Care | Attending: Nurse Practitioner | Admitting: Nurse Practitioner

## 2023-12-28 DIAGNOSIS — K219 Gastro-esophageal reflux disease without esophagitis: Secondary | ICD-10-CM | POA: Insufficient documentation

## 2023-12-28 DIAGNOSIS — G8929 Other chronic pain: Secondary | ICD-10-CM | POA: Diagnosis not present

## 2023-12-28 DIAGNOSIS — M1711 Unilateral primary osteoarthritis, right knee: Secondary | ICD-10-CM | POA: Diagnosis not present

## 2023-12-28 DIAGNOSIS — Z13228 Encounter for screening for other metabolic disorders: Secondary | ICD-10-CM | POA: Insufficient documentation

## 2023-12-28 DIAGNOSIS — M25561 Pain in right knee: Secondary | ICD-10-CM

## 2023-12-28 DIAGNOSIS — M5416 Radiculopathy, lumbar region: Secondary | ICD-10-CM | POA: Insufficient documentation

## 2023-12-28 DIAGNOSIS — M51369 Other intervertebral disc degeneration, lumbar region without mention of lumbar back pain or lower extremity pain: Secondary | ICD-10-CM | POA: Insufficient documentation

## 2023-12-28 HISTORY — DX: Anesthesia of skin: R20.0

## 2023-12-28 HISTORY — DX: Fasciculation: R25.3

## 2023-12-28 HISTORY — DX: Abnormal result of other cardiovascular function study: R94.39

## 2023-12-28 HISTORY — DX: Bradycardia, unspecified: R00.1

## 2023-12-28 MED ORDER — OXYCODONE HCL 5 MG PO TABS: 5.0000 mg | ORAL_TABLET | Freq: Four times a day (QID) | ORAL | 0 refills | Status: AC | PRN

## 2023-12-28 MED ORDER — DEXAMETHASONE SODIUM PHOSPHATE 10 MG/ML IJ SOLN
10.0000 mg | Freq: Once | INTRAMUSCULAR | Status: AC
  Administered 2023-12-28: 10 mg via INTRAMUSCULAR

## 2023-12-28 MED ORDER — PREDNISONE 10 MG (21) PO TBPK: ORAL_TABLET | Freq: Every day | ORAL | 0 refills | Status: DC

## 2023-12-28 MED ORDER — DICLOFENAC SODIUM 1 % EX GEL
4.0000 g | Freq: Four times a day (QID) | CUTANEOUS | 0 refills | Status: AC
Start: 2023-12-28 — End: ?

## 2023-12-28 NOTE — ED Triage Notes (Signed)
"  I am having right knee pain (? Fluid), I have had this pain before and had to have fluid drained from it before, the pain is really bad now again". No new/recent injury.

## 2023-12-28 NOTE — ED Provider Notes (Signed)
 [facility]    CSN: [id number] Arrival date & time: 12/28/23  1150      History   Chief Complaint Chief Complaint  Patient presents with   Knee Problem    HPI [name] is a 58 y.o. male.    [name] is a 58 y.o. male with history of osteoarthritis of the right knee pain presents with pain in the right knee that started on Monday and worsened by Friday afternoon. The patient has a history of knee effusions requiring drainage and received a knee injection from an orthopedic specialist last September for a large effusion. The current flare-up began on Monday and progressively worsened throughout the week, becoming particularly severe by Friday afternoon. The patient initially suspected it might be gout, recalling a previous episode years ago when they received a gel injection for gout in the same knee. The knee is currently swollen but not red, and the patient experiences pain when walking. They describe the knee as "very tender." The patient has attempted to manage the pain at home with ibuprofen , despite being aware that they should avoid it due to being on Eliquis  for atrial fibrillation. They also tried using a prescribed topical gel, but found it ineffective. Knee sleeves have not provided relief either. The patient reports being under significant stress recently, which may be contributing to the flare-up. Previously active with running and using an elliptical machine, the patient has not engaged in these activities recently due to knee issues. They mention that an orthopedic doctor suggested the possibility of a knee replacement about seven years ago. The patient had an ablation last year for atrial fibrillation and reports no episodes since then.  The following portions of the patient's history were reviewed and updated as appropriate: allergies, current medications, past family history, past medical history, past social history, past surgical history, and problem  list.           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   Dizziness 03/21/2015   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/05/2016   Esophageal reflux 10/02/2006   Qualifier: Diagnosis of   By: Alen Amy       Qualifier: Diagnosis of  By: Alen Amy    Qualifier: Diagnosis of  By: Alen Amy   Last Assessment & Plan:  Stable, refill omeprazole      GERD (gastroesophageal reflux disease)    Gout    "no flares in awhile" (01/22/2017)   Gout 05/13/2007   History of MRI of cervical spine 09/25/2010   normal; left shoulder normal as well   Hyperlipidemia 10/02/2006   03/2014 - ASCVD risk score of 5.1%        Hypertension    HYPERTENSION, BENIGN SYSTEMIC 10/02/2006   INSOMNIA 09/19/2008   Qualifier: Diagnosis of   By: Tona Francis NP, Amalia Badder         Left groin pain 01/02/2018   Left sided numbness    Migraine    "~ q 6 months" (01/22/2017)   Migraine 10/02/2006   Qualifier: Diagnosis of  By: Alen Amy         Morbid obesity (HCC)    "I've lost 43# in 15 weeks w/weight watchers" (01/22/2017)   Morbid obesity (HCC) 10/02/2006   Qualifier: Diagnosis of   By: Alen Amy         Muscle fasciculation    OSA (obstructive sleep apnea) 05/17/2011   OSA on CPAP    Paroxysmal atrial fibrillation (HCC) 02/2014   chads2vasc score is 1   Plantar fasciitis of right foot 05/07/2016   Primary osteoarthritis of right knee 07/23/2016   Shortness of breath 03/21/2015   Sinusitis 10/23/2012   Symptomatic sinus bradycardia    Urinary incontinence  01/02/2018    Patient Active Problem List   Diagnosis Date Noted   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   Lumbar radiculopathy 12/28/2023   Esophageal reflux 12/28/2023   Encounter for screening for other metabolic disorders 12/28/2023   Encounter for monitoring dofetilide  therapy 11/05/2023   Nasal congestion 03/25/2023   Blepharitis of lower eyelids of both eyes 02/19/2023   Kidney cysts 05/21/2022   Left upper quadrant abdominal pain 05/21/2022   Acute left ankle pain 10/25/2021   Chronic left-sided low back pain without sciatica 09/05/2020   Abnormal stress test 11/27/2017   Muscle fasciculation 11/27/2017   Routine adult health maintenance 09/12/2017   Plantar fasciitis of left foot 05/07/2016   Palpitations 01/12/2016   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/05/2016   Disequilibrium 03/21/2015   OSA on CPAP    PAF (paroxysmal atrial fibrillation) (HCC)    Chest pain 03/03/2015   Pain in left knee 10/13/2014   Pain in right knee 11/10/2012   Lateral epicondylitis of elbow 04/13/2012   Somnolence 11/25/2011   Pain in left hip 06/17/2011   Obstructive sleep apnea 05/17/2011   Dorsalgia 10/29/2010   Insomnia 09/19/2008    Past Surgical History:  Procedure Laterality Date   ATRIAL FIBRILLATION ABLATION N/A 01/30/2023   Procedure: ATRIAL FIBRILLATION ABLATION;  Surgeon: Lei Pump, MD;  Location: [facility] INVASIVE CV LAB;  Service: Cardiovascular;  Laterality: N/A;   CARDIAC CATHETERIZATION  05/2005   "no blockage"   KNEE ARTHROSCOPY Right 02/2013   LEFT HEART CATH AND CORONARY ANGIOGRAPHY N/A 01/27/2017   Procedure: Left Heart Cath and Coronary Angiography;  Surgeon: Millicent Ally, MD;  Location: [facility] INVASIVE CV LAB;  Service: Cardiovascular;  Laterality: N/A;       Home Medications    Prior to Admission medications   Medication Sig Start Date End Date Taking? Authorizing Provider  apixaban  (ELIQUIS ) 5 MG TABS  tablet Take 1 tablet by mouth twice daily 12/08/23  Yes Fenton, Clint R, PA  atorvastatin  (LIPITOR) 20 MG tablet TAKE 1 TABLET BY MOUTH EVERY OTHER DAY 05/27/23  Yes Camnitz, Babetta Lesch, MD  diclofenac  Sodium (VOLTAREN ) 1 % GEL Apply 4 g topically 4 (four) times daily. Apply to right knee 12/28/23  Yes Maryruth Sol, FNP  fluticasone  (FLONASE ) 50 MCG/ACT nasal spray Place 2 sprays into both nostrils daily. Patient taking differently: Place 2 sprays into both nostrils daily as needed for allergies. 09/14/18  Yes Candee Cha, MD  losartan  (COZAAR ) 50 MG tablet Take 1 tablet by mouth once daily Patient taking differently: Take 25 mg by mouth daily. 05/27/23  Yes Candee Cha, MD  MITIGARE  0.6 MG CAPS Take 1 capsule (0.6 mg total) by mouth daily. 09/29/23  Yes Candee Cha, MD  Multiple Vitamin (MULTIVITAMIN WITH MINERALS) TABS  tablet Take 1 tablet by mouth at bedtime.   Yes [provider]  Omega-3 Fatty Acids (FISH OIL) 1000 MG CAPS Take 1,000 mg by mouth in the morning.   Yes [provider]  omeprazole  (PRILOSEC) 40 MG capsule Take 1 capsule (40 mg total) by mouth daily before breakfast. 05/29/23 08/21/24 Yes Esterwood, Amy S, PA-C  oxyCODONE  (ROXICODONE ) 5 MG immediate release tablet Take 1 tablet (5 mg total) by mouth every 6 (six) hours as needed for severe pain (pain score 7-10) or breakthrough pain. 12/28/23  Yes Maryruth Sol, FNP  potassium chloride  (KLOR-CON  M) 10 MEQ tablet Take 1 tablet (10 mEq total) by mouth at bedtime. 06/18/23  Yes Fenton, Clint R, PA  predniSONE  (STERAPRED UNI-PAK 21 TAB) 10 MG (21) TBPK tablet Take by mouth daily. Take 6 tabs by mouth daily  for 2 days, then 5 tabs for 2 days, then 4 tabs for 2 days, then 3 tabs for 2 days, 2 tabs for 2 days, then 1 tab by mouth daily for 2 days 12/28/23  Yes Maryruth Sol, FNP  TURMERIC PO Take 1 capsule by mouth every other day. In the morning.   Yes [provider]  baclofen   (LIORESAL ) 10 MG tablet TAKE 1/2 (ONE-HALF) TABLET BY MOUTH ONCE DAILY AS NEEDED FOR MUSCLE SPASM 07/17/22   Candee Cha, MD  clotrimazole  (LOTRIMIN ) 1 % cream Apply 1 Application topically 2 (two) times daily. 09/29/23   Candee Cha, MD  dofetilide  (TIKOSYN ) 500 MCG capsule TAKE 1 CAPSULE BY MOUTH 2 TIMES DAILY. 05/16/23   Fenton, Clint R, PA  gabapentin (NEURONTIN) 300 MG capsule Take 300 mg by mouth daily as needed (pain.).    [provider]  sodium chloride  (OCEAN) 0.65 % SOLN nasal spray Place 2 sprays into both nostrils as needed for congestion. 03/25/23   Lavada Porteous, DO    Family History Family History  Problem Relation Age of Onset   Atrial fibrillation Mother 54       had a stroke   Hypertension Father    Diabetes Father    Colon cancer Neg Hx    Esophageal cancer Neg Hx    Stomach cancer Neg Hx     Social History Social History   Tobacco Use   Smoking status: Former    Current packs/day: 0.00    Average packs/day: 1 pack/day for 10.0 years (10.0 ttl pk-yrs)    Types: Cigarettes    Start date: 04/02/1984    Quit date: 04/02/1994    Years since quitting: 29.7   Smokeless tobacco: Former    Types: Chew   Tobacco comments:    Former smoker and chew 07/15/22  Vaping Use   Vaping status: Never Used  Substance Use Topics   Alcohol use: No    Alcohol/week: 0.0 standard drinks of alcohol    Comment: "stopped in 1994"   Drug use: Not Currently    Types: Marijuana    Comment: "stopped marijuna in 1994"     Allergies   Hydrochlorothiazide-propranolol  [propranolol -hctz] and Penicillins   Review of Systems Review of Systems  Constitutional:  Negative for fever.  Musculoskeletal:  Positive for arthralgias (right knee pain), gait problem (due to knee pain) and joint swelling.  Neurological:  Negative for weakness and numbness.  All other systems reviewed and are negative.    Physical Exam Triage Vital Signs ED Triage Vitals   Encounter Vitals Group     BP 12/28/23 1239 129/89  Systolic BP Percentile --      Diastolic BP Percentile --      Pulse Rate 12/28/23 1239 71     Resp 12/28/23 1239 20     Temp 12/28/23 1239 97.7 F (36.5 C)     Temp Source 12/28/23 1239 Oral     SpO2 12/28/23 1239 95 %     Weight 12/28/23 1235 (!) 315 lb 0.6 oz (142.9 kg)     Height 12/28/23 1235 6\' 2"  (1.88 m)     Head Circumference --      Peak Flow --      Pain Score 12/28/23 1230 10     Pain Loc --      Pain Education --      Exclude from Growth Chart --    No data found.  Updated Vital Signs BP 129/89 (BP Location: Right Arm)   Pulse 71   Temp 97.7 F (36.5 C) (Oral)   Resp 20   Ht 6\' 2"  (1.88 m)   Wt (!) 315 lb 0.6 oz (142.9 kg)   SpO2 95%   BMI 40.45 kg/m   Visual Acuity Right Eye Distance:   Left Eye Distance:   Bilateral Distance:    Right Eye Near:   Left Eye Near:    Bilateral Near:     Physical Exam Vitals reviewed.  Constitutional:      General: He is awake. He is not in acute distress.    Appearance: Normal appearance. He is well-developed. He is obese. He is not ill-appearing or toxic-appearing.  HENT:     Head: Normocephalic.     Mouth/Throat:     Mouth: Mucous membranes are moist.  Eyes:     Conjunctiva/sclera: Conjunctivae normal.  Cardiovascular:     Rate and Rhythm: Normal rate and regular rhythm.     Heart sounds: Normal heart sounds.  Pulmonary:     Effort: Pulmonary effort is normal.     Breath sounds: Normal breath sounds.  Musculoskeletal:        General: Normal range of motion.     Right knee: Swelling (mild) present. No deformity, effusion or erythema. Normal range of motion. Tenderness present over the medial joint line and patellar tendon. Normal alignment, normal meniscus and normal patellar mobility.     Right lower leg: No edema.     Left lower leg: No edema.  Skin:    General: Skin is warm and dry.  Neurological:     General: No focal deficit present.      Mental Status: He is alert and oriented to person, place, and time.  Psychiatric:        Behavior: Behavior is cooperative.      UC Treatments / Results  Labs (all labs ordered are listed, but only abnormal results are displayed) Labs Reviewed - No data to display  EKG   Radiology DG Knee Complete 4 Views Right Result Date: 12/28/2023 CLINICAL DATA:  Acute on chronic knee pain EXAM: RIGHT KNEE - COMPLETE 4+ VIEW COMPARISON:  04/23/2023 FINDINGS: Moderately severe right knee osteoarthritis most pronounced in the medial patellofemoral compartments with joint space loss, sclerosis and bony spurring. Lateral compartment is preserved. No acute osseous finding, fracture, malalignment. Small effusion noted on lateral view. Soft tissues unremarkable. IMPRESSION: 1. Moderately severe right knee osteoarthritis. 2. Small effusion. 3. No acute osseous finding. Electronically Signed   By: Melven Stable.  Shick M.D.   On: 12/28/2023 13:53    Procedures Procedures (including critical care  time)  Medications Ordered in UC Medications  dexamethasone  (DECADRON ) injection 10 mg (has no administration in time range)    Initial Impression / Assessment and Plan / UC Course  I have reviewed the triage vital signs and the nursing notes.  Pertinent labs & imaging results that were available during my care of the patient were reviewed by me and considered in my medical decision making (see chart for details).    Patient presents with an acute flare of right knee osteoarthritis. He has a known history of right knee OA, previously managed with joint aspiration and steroid injection by orthopedics in September. Current imaging shows severe arthritis with joint space narrowing and minimal effusion. Pain and difficulty walking are consistent with an arthritic flare rather than significant fluid buildup. A decadron  injection was administered in clinic today. The patient was prescribed a steroid Dosepak, topical diclofenac  gel  to be applied four times daily, and a short course of oxycodone  for severe and breakthrough pain. He was instructed to use his knee sleeve at home and apply ice to the area frequently. He should contact his orthopedic specialist on Tuesday for follow-up. Due to his use of Eliquis , he was advised to avoid ibuprofen  and other oral NSAIDs because of increased bleeding risk.   Today's evaluation has revealed no signs of a dangerous process. Discussed diagnosis with patient and/or guardian. Patient and/or guardian aware of their diagnosis, possible red flag symptoms to watch out for and need for close follow up. Patient and/or guardian understands verbal and written discharge instructions. Patient and/or guardian comfortable with plan and disposition.  Patient and/or guardian has a clear mental status at this time, good insight into illness (after discussion and teaching) and has clear judgment to make decisions regarding their care  Documentation was completed with the aid of voice recognition software. Transcription may contain typographical errors.    Final Clinical Impressions(s) / UC Diagnoses   Final diagnoses:  Acute pain of right knee  Primary osteoarthritis of right knee  Chronic pain of right knee     Discharge Instructions      You were seen today for a flare-up of osteoarthritis in your right knee. This is likely due to inflammation in the joint rather than fluid buildup. You received a steroid injection in the clinic to help reduce pain and swelling. You were also prescribed a steroid Dosepak, topical diclofenac  gel to apply four times a day, and a short course of oxycodone  for severe or breakthrough pain. Please avoid using ibuprofen  or other oral anti-inflammatory medications while taking Eliquis , as this increases your risk of bleeding. If needed, you may take Tylenol  (acetaminophen ) 1000 mg every six hours for additional pain relief. This equals two 500 mg tablets at a time. Be  careful not to take more than 4000 mg of Tylenol  in a 24-hour period. Use your knee sleeve at home and apply ice to the knee as often as needed to reduce discomfort. Always place a towel between your skin and the ice pack to avoid skin damage. Follow up with your orthopedic specialist, call on Tuesday to make an appointment for continued management of your condition. If your pain worsens or you develop new symptoms such as redness, warmth, or fever, seek medical attention promptly.    ED Prescriptions     Medication Sig Dispense Auth. Provider   predniSONE  (STERAPRED UNI-PAK 21 TAB) 10 MG (21) TBPK tablet Take by mouth daily. Take 6 tabs by mouth daily  for 2  days, then 5 tabs for 2 days, then 4 tabs for 2 days, then 3 tabs for 2 days, 2 tabs for 2 days, then 1 tab by mouth daily for 2 days 42 tablet Leverne Tessler, Benedict, FNP   diclofenac  Sodium (VOLTAREN ) 1 % GEL Apply 4 g topically 4 (four) times daily. Apply to right knee 150 g Maryruth Sol, FNP   oxyCODONE  (ROXICODONE ) 5 MG immediate release tablet Take 1 tablet (5 mg total) by mouth every 6 (six) hours as needed for severe pain (pain score 7-10) or breakthrough pain. 10 tablet Maryruth Sol, FNP      I have reviewed the PDMP during this encounter.   Maryruth Sol, [place] 12/28/23 1416

## 2023-12-28 NOTE — Discharge Instructions (Addendum)
 You were seen today for a flare-up of osteoarthritis in your right knee. This is likely due to inflammation in the joint rather than fluid buildup. You received a steroid injection in the clinic to help reduce pain and swelling. You were also prescribed a steroid Dosepak, topical diclofenac  gel to apply four times a day, and a short course of oxycodone  for severe or breakthrough pain. Please avoid using ibuprofen  or other oral anti-inflammatory medications while taking Eliquis , as this increases your risk of bleeding. If needed, you may take Tylenol  (acetaminophen ) 1000 mg every six hours for additional pain relief. This equals two 500 mg tablets at a time. Be careful not to take more than 4000 mg of Tylenol  in a 24-hour period. Use your knee sleeve at home and apply ice to the knee as often as needed to reduce discomfort. Always place a towel between your skin and the ice pack to avoid skin damage. Follow up with your orthopedic specialist, call on Tuesday to make an appointment for continued management of your condition. If your pain worsens or you develop new symptoms such as redness, warmth, or fever, seek medical attention promptly.

## 2023-12-30 ENCOUNTER — Ambulatory Visit: Admitting: Physician Assistant

## 2023-12-30 ENCOUNTER — Telehealth: Payer: Self-pay

## 2023-12-30 ENCOUNTER — Encounter: Payer: Self-pay | Admitting: Physician Assistant

## 2023-12-30 DIAGNOSIS — M25561 Pain in right knee: Secondary | ICD-10-CM | POA: Diagnosis not present

## 2023-12-30 MED ORDER — LIDOCAINE HCL 1 % IJ SOLN
5.0000 mL | INTRAMUSCULAR | Status: AC | PRN
  Administered 2023-12-30: 5 mL

## 2023-12-30 NOTE — Addendum Note (Signed)
 Addended by: Georgann Kim on: 12/30/2023 10:34 AM   Modules accepted: Orders

## 2023-12-30 NOTE — Progress Notes (Signed)
 Office Visit Note   Patient: [name]           Date of Birth: 05/26/1966           MRN: [id number] Visit Date: 12/30/2023              Requested by: Candee Cha, MD [address] [place],  [place] [address] PCP: Candee Cha, MD  Right knee pain    HPI: Patient is a pleasant 58 year old gentleman who have seen in the past.  He presented to the urgent care 525 complaining of right knee pain patient has a history of gout was not sure if this what was going on.  He was given prednisone  at the urgent care no injuries  Assessment & Plan: Visit Diagnoses:  1. Acute pain of right knee     Plan: Osteoarthritis right knee with effusion.  50 cc of joint fluid was aspirated today no signs of infected fluid but will send for crystals.  Also will have him go forward with authorization for viscosupplementation.  He has had steroid injections in the past not very helpful.  Did not inject steroid into his knee today as he is on a steroid taper pack This patient is diagnosed with osteoarthritis of the knee(s).    Radiographs show evidence of joint space narrowing, osteophytes, subchondral sclerosis and/or subchondral cysts.  This patient has knee pain which interferes with functional and activities of daily living.    This patient has experienced inadequate response, adverse effects and/or intolerance with conservative treatments such as acetaminophen , NSAIDS, topical creams, physical therapy or regular exercise, knee bracing and/or weight loss.   This patient has experienced inadequate response or has a contraindication to intra articular steroid injections for at least 3 months.   This patient is not scheduled to have a total knee replacement within 6 months of starting treatment with viscosupplementation.  Follow-Up Instructions: Return if symptoms worsen or fail to improve.   Ortho Exam  Patient is alert, oriented, no adenopathy, well-dressed, normal affect, normal  respiratory effort. Examination of his right knee has a moderate effusion no redness no erythema compartments are soft and compressible he has quite a bit of crepitus with range of motion negative Celine Collard' sign  Imaging: No results found. No images are attached to the encounter.  Labs: Lab Results  Component Value Date   HGBA1C 5.7 (A) 09/06/2020   HGBA1C 5.8 (A) 03/22/2019   HGBA1C 5.6 01/22/2017   ESRSEDRATE 1 01/23/2017   CRP <0.8 01/23/2017   LABURIC 6.9 10/25/2021   LABURIC 7.1 02/03/2017   LABURIC 7.2 09/11/2015   REPTSTATUS 07/22/2016 FINAL 07/17/2016   REPTSTATUS 07/17/2016 FINAL 07/17/2016   GRAMSTAIN  07/17/2016    ABUNDANT WBC PRESENT, PREDOMINANTLY PMN NO ORGANISMS SEEN    CULT NO GROWTH 5 DAYS 07/17/2016     Lab Results  Component Value Date   ALBUMIN 4.1 03/26/2022   ALBUMIN 4.5 01/07/2022   ALBUMIN 4.2 05/19/2019    Lab Results  Component Value Date   MG 2.0 11/05/2023   MG 2.1 05/09/2023   MG 2.1 07/22/2022   No results found for: "VD25OH"  No results found for: "PREALBUMIN"    Latest Ref Rng & Units 01/13/2023    2:03 PM 09/20/2022    9:47 AM 03/26/2022    2:58 PM  CBC EXTENDED  WBC 3.4 - 10.8 x10E3/uL 10.5  7.3  6.5   RBC 4.14 - 5.80 x10E6/uL 4.71  4.96  4.86  Hemoglobin 13.0 - 17.7 g/dL 16.1  09.6  04.5   HCT 37.5 - 51.0 % 39.2  41.1  39.2   Platelets 150 - 450 x10E3/uL 230  229  216   NEUT# 1.7 - 7.7 K/uL   3.2   Lymph# 0.7 - 4.0 K/uL   2.5      There is no height or weight on file to calculate BMI.  Orders:  No orders of the defined types were placed in this encounter.  No orders of the defined types were placed in this encounter.    Procedures: Large Joint Inj: R knee on 12/30/2023 9:43 AM Indications: pain and diagnostic evaluation Details: 25 G 1.5 in needle, superolateral approach  Arthrogram: No  Medications: 5 mL lidocaine  1 % Aspirate: 50 mL yellow Outcome: tolerated well, no immediate complications  After verbal  consent was obtained superior lateral pouch was prepped with alcohol and Betadine.  5 cc of lidocaine  plain was injected.  After adequate analgesia 18-gauge needle was used to aspirate 50 cc of yellow fluid from his knee.  Band-Aid and Ace wrap applied patient ambulated from the clinic Procedure, treatment alternatives, risks and benefits explained, specific risks discussed. Consent was given by the patient.      Clinical Data: No additional findings.  ROS:  All other systems negative, except as noted in the HPI. Review of Systems  Objective: Vital Signs: There were no vitals taken for this visit.  Specialty Comments:  No specialty comments available.  PMFS History: Patient Active Problem List   Diagnosis Date Noted   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   Lumbar radiculopathy 12/28/2023   Esophageal reflux 12/28/2023   Encounter for screening for other metabolic disorders 12/28/2023   Encounter for monitoring dofetilide  therapy 11/05/2023   Nasal congestion 03/25/2023   Blepharitis of lower eyelids of both eyes 02/19/2023   Kidney cysts 05/21/2022   Left upper quadrant abdominal pain 05/21/2022   Acute left ankle pain 10/25/2021   Chronic left-sided low back pain without sciatica 09/05/2020   Abnormal stress test 11/27/2017   Muscle fasciculation 11/27/2017   Routine adult health maintenance 09/12/2017   Plantar fasciitis of left foot 05/07/2016   Palpitations 01/12/2016   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/05/2016   Disequilibrium 03/21/2015   OSA on CPAP    PAF (paroxysmal atrial fibrillation) (HCC)    Chest pain 03/03/2015   Pain in left knee 10/13/2014   Pain in right knee 11/10/2012   Lateral epicondylitis of elbow 04/13/2012   Somnolence 11/25/2011   Pain in left hip 06/17/2011   Obstructive sleep apnea 05/17/2011   Dorsalgia 10/29/2010   Insomnia 09/19/2008   Past Medical History:  Diagnosis Date    Abnormal stress test    Acute nonintractable headache 02/03/2017   Allergic rhinitis 01/02/2018   Allergy    Arthritis    "knees" (01/22/2017)   Atrial fibrillation (HCC) 03/14/2014   Atypical chest pain 03/03/2015   Body mass index 40.0-44.9, adult (HCC) 02/10/2019   Cataract    Chronic back pain 10/29/2010   Chronic lower back pain    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   Dizziness 03/21/2015   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/05/2016   Esophageal reflux 10/02/2006   Qualifier: Diagnosis of   By: Alen Amy       Qualifier: Diagnosis of  By: Alen Amy    Qualifier: Diagnosis of  By: Alen Amy  Last Assessment & Plan:  Stable, refill omeprazole      GERD (gastroesophageal reflux disease)    Gout    "no flares in awhile" (01/22/2017)   Gout 05/13/2007   History of MRI of cervical spine 09/25/2010   normal; left shoulder normal as well   Hyperlipidemia 10/02/2006   03/2014 - ASCVD risk score of 5.1%        Hypertension    HYPERTENSION, BENIGN SYSTEMIC 10/02/2006   INSOMNIA 09/19/2008   Qualifier: Diagnosis of   By: Tona Francis NP, Amalia Badder         Left groin pain 01/02/2018   Left sided numbness    Migraine    "~ q 6 months" (01/22/2017)   Migraine 10/02/2006   Qualifier: Diagnosis of   By: Alen Amy         Morbid obesity (HCC)    "I've lost 43# in 15 weeks w/weight watchers" (01/22/2017)   Morbid obesity (HCC) 10/02/2006   Qualifier: Diagnosis of   By: Alen Amy         Muscle fasciculation    OSA (obstructive sleep apnea) 05/17/2011   OSA on CPAP    Paroxysmal atrial fibrillation (HCC) 02/2014   chads2vasc score is 1   Plantar fasciitis of right foot 05/07/2016   Primary osteoarthritis of right knee 07/23/2016   Shortness of breath 03/21/2015   Sinusitis 10/23/2012   Symptomatic sinus bradycardia    Urinary incontinence 01/02/2018    Family History  Problem Relation Age of Onset   Atrial  fibrillation Mother 33       had a stroke   Hypertension Father    Diabetes Father    Colon cancer Neg Hx    Esophageal cancer Neg Hx    Stomach cancer Neg Hx     Past Surgical History:  Procedure Laterality Date   ATRIAL FIBRILLATION ABLATION N/A 01/30/2023   Procedure: ATRIAL FIBRILLATION ABLATION;  Surgeon: Lei Pump, MD;  Location: [facility] IN[facility]ASIVE [facility];  Service: Cardiovascular;  Laterality: N/A;   CARDIAC CATHETERIZATION  05/2005   "no blockage"   KNEE ARTHROSCOPY Right 02/2013   LEFT HEART CATH AND CORONARY ANGIOGRAPHY N/A 01/27/2017   Procedure: Left Heart Cath and Coronary Angiography;  Surgeon: Millicent Ally, MD;  Location: [facility] INVASIVE CV LAB;  Service: Cardiovascular;  Laterality: N/A;   Social History   Occupational History   Not on file  Tobacco Use   Smoking status: Former    Current packs/day: 0.00    Average packs/day: 1 pack/day for 10.0 years (10.0 ttl pk-yrs)    Types: Cigarettes    Start date: 04/02/1984    Quit date: 04/02/1994    Years since quitting: 29.7   Smokeless tobacco: Former    Types: Chew   Tobacco comments:    Former smoker and chew 07/15/22  Vaping Use   Vaping status: Never Used  Substance and Sexual Activity   Alcohol use: No    Alcohol/week: 0.0 standard drinks of alcohol    Comment: "stopped in 1994"   Drug use: Not Currently    Types: Marijuana    Comment: "stopped marijuna in 1994"   Sexual activity: Not Currently

## 2023-12-31 ENCOUNTER — Other Ambulatory Visit: Payer: Self-pay | Admitting: Physician Assistant

## 2023-12-31 LAB — CELL COUNT + DIFF, W/O CRYST-SYNVL FLD
Basophils, %: 0 %
Eosinophils-Synovial: 0 % (ref 0–2)
Lymphocytes-Synovial Fld: 30 % (ref 0–74)
Monocyte/Macrophage: 0 % (ref 0–69)
Neutrophil, Synovial: 70 % — ABNORMAL HIGH (ref 0–24)
Synoviocytes, %: 0 % (ref 0–15)
WBC, Synovial: 315 {cells}/uL — ABNORMAL HIGH (ref <150)

## 2023-12-31 LAB — HOUSE ACCOUNT TRACKING

## 2024-01-01 ENCOUNTER — Telehealth: Payer: Self-pay | Admitting: Physician Assistant

## 2024-01-01 NOTE — Telephone Encounter (Signed)
 Patient called and said he went to work but have to leave work because he can barely walk. He may need a note for work for a few days and need to see what you think he should do? CB#[phone]

## 2024-01-02 ENCOUNTER — Encounter (HOSPITAL_BASED_OUTPATIENT_CLINIC_OR_DEPARTMENT_OTHER): Payer: Self-pay

## 2024-01-02 ENCOUNTER — Other Ambulatory Visit (HOSPITAL_BASED_OUTPATIENT_CLINIC_OR_DEPARTMENT_OTHER): Payer: Self-pay

## 2024-01-02 ENCOUNTER — Ambulatory Visit (HOSPITAL_BASED_OUTPATIENT_CLINIC_OR_DEPARTMENT_OTHER): Admitting: Student

## 2024-01-02 ENCOUNTER — Encounter (HOSPITAL_BASED_OUTPATIENT_CLINIC_OR_DEPARTMENT_OTHER): Payer: Self-pay | Admitting: Student

## 2024-01-02 DIAGNOSIS — M1711 Unilateral primary osteoarthritis, right knee: Secondary | ICD-10-CM | POA: Diagnosis not present

## 2024-01-02 MED ORDER — TRAMADOL HCL 50 MG PO TABS
50.0000 mg | ORAL_TABLET | Freq: Four times a day (QID) | ORAL | 0 refills | Status: AC | PRN
  Filled 2024-01-02: qty 15, 4d supply, fill #0

## 2024-01-02 NOTE — Progress Notes (Signed)
 Chief Complaint: Right knee pain    Discussed the use of AI scribe software for clinical note transcription with the patient, who gave verbal consent to proceed.  History of Present Illness [name] is a 58 year old male who presents with knee pain and swelling. He experiences persistent knee pain and swelling, primarily on the medial aspect, with tightness and worsening pain during ambulation and flexion. A previous aspiration removed 50 cc of fluid which was sent for analysis, but pain persists. He describes a sensation of popping and difficulty bending over. There is no recent knee injury. He is on a prednisone  taper, started on Dec 29, 2023, with minimal relief, and uses Voltaren  gel for topical relief. He has a history of steroid injections with some relief. He is on Eliquis  following an ablation for atrial fibrillation. He works as a Museum/gallery exhibitions officer, requiring frequent movement, and is concerned about his ability to perform his job due to knee pain.   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   Dizziness 03/21/2015   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/05/2016   Esophageal reflux 10/02/2006   Qualifier: Diagnosis of   By: Alen Amy       Qualifier: Diagnosis of  By: Alen Amy    Qualifier: Diagnosis of  By: Alen Amy   Last Assessment & Plan:  Stable, refill omeprazole      GERD (gastroesophageal  reflux disease)    Gout    "no flares in awhile" (01/22/2017)   Gout 05/13/2007   History of MRI of cervical spine 09/25/2010   normal; left shoulder normal as well   Hyperlipidemia 10/02/2006   03/2014 - ASCVD risk score of 5.1%        Hypertension    HYPERTENSION, BENIGN SYSTEMIC 10/02/2006   INSOMNIA 09/19/2008   Qualifier: Diagnosis of   By: Tona Francis NP, Amalia Badder         Left groin pain 01/02/2018   Left sided numbness    Migraine    "~ q 6 months" (01/22/2017)   Migraine 10/02/2006   Qualifier: Diagnosis of   By: Alen Amy         Morbid obesity (HCC)    "I've lost 43# in 15 weeks w/weight watchers" (01/22/2017)   Morbid obesity (HCC) 10/02/2006   Qualifier: Diagnosis of   By: Alen Amy         Muscle fasciculation    OSA (obstructive sleep apnea) 05/17/2011   OSA on CPAP    Paroxysmal atrial fibrillation (HCC) 02/2014   chads2vasc score is 1   Plantar fasciitis of right foot 05/07/2016   Primary osteoarthritis of right knee 07/23/2016   Shortness of breath 03/21/2015   Sinusitis 10/23/2012   Symptomatic sinus bradycardia    Urinary incontinence 01/02/2018  Past Surgical History:  Procedure Laterality Date   ATRIAL FIBRILLATION ABLATION N/A 01/30/2023   Procedure: ATRIAL FIBRILLATION ABLATION;  Surgeon: Lei Pump, MD;  Location: [facility] INVASIVE CV LAB;  Service: Cardiovascular;  Laterality: N/A;   CARDIAC CATHETERIZATION  05/2005   "no blockage"   KNEE ARTHROSCOPY Right 02/2013   LEFT HEART CATH AND CORONARY ANGIOGRAPHY N/A 01/27/2017   Procedure: Left Heart Cath and Coronary Angiography;  Surgeon: Millicent Ally, MD;  Location: [facility] INVASIVE CV LAB;  Service: Cardiovascular;  Laterality: N/A;   Social History   Socioeconomic History   Marital status: Married    Spouse name: Not on file   Number of children: 3   Years of education: Not on file   Highest education level: 12th grade  Occupational History   Not on file  Tobacco Use   Smoking status:  Former    Current packs/day: 0.00    Average packs/day: 1 pack/day for 10.0 years (10.0 ttl pk-yrs)    Types: Cigarettes    Start date: 04/02/1984    Quit date: 04/02/1994    Years since quitting: 29.7   Smokeless tobacco: Former    Types: Chew   Tobacco comments:    Former smoker and chew 07/15/22  Vaping Use   Vaping status: Never Used  Substance and Sexual Activity   Alcohol use: No    Alcohol/week: 0.0 standard drinks of alcohol    Comment: "stopped in 1994"   Drug use: Not Currently    Types: Marijuana    Comment: "stopped marijuna in 1994"   Sexual activity: Not Currently  Other Topics Concern   Not on file  Social History Narrative   Lives in [place] with spouse.  3 children, all grown.   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   Social Drivers of Corporate investment banker Strain: Low Risk  (09/28/2023)   Overall Financial Resource Strain (CARDIA)    Difficulty of Paying Living Expenses: Not hard at all  Food Insecurity: No Food Insecurity (09/28/2023)   Hunger Vital Sign    Worried About Running Out of Food in the Last Year: Never true    Ran Out of Food in the Last Year: Never true  Transportation Needs: No Transportation Needs (09/28/2023)   PRAPARE - Administrator, Civil Service (Medical): No    Lack of Transportation (Non-Medical): No  Physical Activity: Sufficiently Active (09/28/2023)   Exercise Vital Sign    Days of Exercise per Week: 7 days    Minutes of Exercise per Session: 60 min  Stress: No Stress Concern Present (09/28/2023)   [employer] of Occupational Health - Occupational Stress Questionnaire    Feeling of Stress : Not at all  Social Connections: Socially Integrated (09/28/2023)   Social Connection and Isolation Panel [NHANES]    Frequency of Communication with Friends and Family: More than three times a week    Frequency of Social Gatherings with Friends and Family: More than three times a week    Attends  Religious Services: More than 4 times per year    Active Member of [employer] or Organizations: Yes    Attends Engineer, structural: More than 4 times per year    Marital Status: Married   Family History  Problem Relation Age of Onset   Atrial fibrillation Mother 80       had a stroke   Hypertension Father    Diabetes Father  Colon cancer Neg Hx    Esophageal cancer Neg Hx    Stomach cancer Neg Hx    Allergies  Allergen Reactions   Hydrochlorothiazide-Propranolol  [Propranolol -Hctz] Other (See Comments)    Joints lock up   Penicillins Other (See Comments)    Hives Makes joints stiff and unable to move Has patient had a PCN reaction causing immediate rash, facial/tongue/throat swelling, SOB or lightheadedness with hypotension: No Has patient had a PCN reaction causing severe rash involving mucus membranes or skin necrosis: No Has patient had a PCN reaction that required hospitalization No Has patient had a PCN reaction occurring within the last 10 years: No If all of the above answers are "NO", then may proceed with Cephalosporin use.   Current Outpatient Medications  Medication Sig Dispense Refill   traMADol  (ULTRAM ) 50 MG tablet Take 1 tablet (50 mg total) by mouth every 6 (six) hours as needed for up to 5 days. 15 tablet 0   apixaban  (ELIQUIS ) 5 MG TABS tablet Take 1 tablet by mouth twice daily 180 tablet 3   atorvastatin  (LIPITOR) 20 MG tablet TAKE 1 TABLET BY MOUTH EVERY OTHER DAY 45 tablet 3   baclofen  (LIORESAL ) 10 MG tablet TAKE 1/2 (ONE-HALF) TABLET BY MOUTH ONCE DAILY AS NEEDED FOR MUSCLE SPASM 30 tablet 0   clotrimazole  (LOTRIMIN ) 1 % cream Apply 1 Application topically 2 (two) times daily. 30 g 0   diclofenac  Sodium (VOLTAREN ) 1 % GEL Apply 4 g topically 4 (four) times daily. Apply to right knee 150 g 0   dofetilide  (TIKOSYN ) 500 MCG capsule TAKE 1 CAPSULE BY MOUTH 2 TIMES DAILY. 180 capsule 2   fluticasone  (FLONASE ) 50 MCG/ACT nasal spray Place 2 sprays into both  nostrils daily. (Patient taking differently: Place 2 sprays into both nostrils daily as needed for allergies.) 16 g 6   gabapentin (NEURONTIN) 300 MG capsule Take 300 mg by mouth daily as needed (pain.).     losartan  (COZAAR ) 50 MG tablet Take 1 tablet by mouth once daily (Patient taking differently: Take 25 mg by mouth daily.) 90 tablet 3   MITIGARE  0.6 MG CAPS Take 1 capsule (0.6 mg total) by mouth daily. 90 capsule 3   Multiple Vitamin (MULTIVITAMIN WITH MINERALS) TABS tablet Take 1 tablet by mouth at bedtime.     Omega-3 Fatty Acids (FISH OIL) 1000 MG CAPS Take 1,000 mg by mouth in the morning.     omeprazole  (PRILOSEC) 40 MG capsule Take 1 capsule (40 mg total) by mouth daily before breakfast. 90 capsule 4   oxyCODONE  (ROXICODONE ) 5 MG immediate release tablet Take 1 tablet (5 mg total) by mouth every 6 (six) hours as needed for severe pain (pain score 7-10) or breakthrough pain. 10 tablet 0   potassium chloride  (KLOR-CON  M) 10 MEQ tablet Take 1 tablet (10 mEq total) by mouth at bedtime. 30 tablet 6   predniSONE  (STERAPRED UNI-PAK 21 TAB) 10 MG (21) TBPK tablet Take by mouth daily. Take 6 tabs by mouth daily  for 2 days, then 5 tabs for 2 days, then 4 tabs for 2 days, then 3 tabs for 2 days, 2 tabs for 2 days, then 1 tab by mouth daily for 2 days 42 tablet 0   sodium chloride  (OCEAN) 0.65 % SOLN nasal spray Place 2 sprays into both nostrils as needed for congestion. 50 mL 0   TURMERIC PO Take 1 capsule by mouth every other day. In the morning.     No current facility-administered medications  for this visit.   No results found.  Review of Systems:   A ROS was performed including pertinent positives and negatives as documented in the HPI.  Physical Exam :   Constitutional: NAD and appears stated age Neurological: Alert and oriented Psych: Appropriate affect and cooperative There were no vitals taken for this visit.   Comprehensive Musculoskeletal Exam:    Right knee exam demonstrates  active range of motion from 0 to 110 degrees with palpable crepitus.  Positive for tenderness over the medial joint line.  Minimal to mild effusion without overlying erythema.  No instability with varus or valgus stress.  Imaging:   Xray review from 12/28/23 (right knee 4 views): Tricompartmental osteoarthritis with diffuse osteophytes and moderate joint space narrowing in the medial compartment.  Mild to moderate osteoarthritis within the patellofemoral compartment.   I personally reviewed and interpreted the radiographs.      Assessment & Plan Knee osteoarthritis   Review of right knee x-rays demonstrates moderate osteoarthritis most notable within the medial compartment which is consistent with his current symptoms.  Recent knee aspiration gave him some temporary relief but pain continues to persist.  Review of synovial fluid analysis from a few days ago shows no significant abnormality.  Fluid was not analyzed for crystals although he does have history of gout, but low suspicion for gout flare at this time.  He is currently still on a prednisone  taper so I did discuss I would like to hold off on a potential steroid injection at this time.  He did try viscosupplementation many years ago but did not tolerate this very well.  Discussed plan to accelerate taper off prednisone  given that this has brought no relief.  Will provide short prescription of tramadol  to help manage pain and he can continue to use Voltaren .  Will plan to have him return to clinic next week for likely steroid injection and discussion of further management.     I personally saw and evaluated the patient, and participated in the management and treatment plan.  Sharrell Deck, PA-C Orthopedics

## 2024-01-05 NOTE — Telephone Encounter (Signed)
 Submitted online [employer] for [employer], [employer].

## 2024-01-06 NOTE — Telephone Encounter (Signed)
VOB submitted for Durolane, right knee due to insurance.

## 2024-01-07 ENCOUNTER — Ambulatory Visit (HOSPITAL_BASED_OUTPATIENT_CLINIC_OR_DEPARTMENT_OTHER): Admitting: Student

## 2024-01-07 ENCOUNTER — Encounter (HOSPITAL_BASED_OUTPATIENT_CLINIC_OR_DEPARTMENT_OTHER): Payer: Self-pay | Admitting: Student

## 2024-01-07 ENCOUNTER — Other Ambulatory Visit (HOSPITAL_COMMUNITY): Payer: Self-pay | Admitting: Physician Assistant

## 2024-01-07 DIAGNOSIS — M25561 Pain in right knee: Secondary | ICD-10-CM

## 2024-01-07 DIAGNOSIS — M1711 Unilateral primary osteoarthritis, right knee: Secondary | ICD-10-CM | POA: Diagnosis not present

## 2024-01-07 MED ORDER — TRIAMCINOLONE ACETONIDE 40 MG/ML IJ SUSP
2.0000 mL | INTRAMUSCULAR | Status: AC | PRN
  Administered 2024-01-07: 2 mL via INTRA_ARTICULAR

## 2024-01-07 MED ORDER — LIDOCAINE HCL 1 % IJ SOLN
4.0000 mL | INTRAMUSCULAR | Status: AC | PRN
  Administered 2024-01-07: 4 mL

## 2024-01-07 NOTE — Progress Notes (Signed)
     HPI: Patient presents today for an injection of the right knee.  X-rays taken on 5/25 demonstrated moderate tricompartmental osteoarthritis most notable in the medial compartment.  He was on course of prednisone  which only gave him mild relief and he completed this course yesterday.  His knee pain has improved slightly however still persists and is interfering with his daily activities.  Pain is both in the medial and lateral knee.   Physical Exam: Right knee exam demonstrates presence of a mild effusion.  Tenderness over bilateral joint lines, worse medially.  Active range of motion from 0 to 110 degrees.  No overlying erythema or warmth.   Procedure Note  Patient: [name]             Date of Birth: 1965/10/27           MRN: [id number]             Visit Date: 01/07/2024  Procedures: Visit Diagnoses:  1. Primary osteoarthritis of right knee     Large Joint Inj: R knee on 01/07/2024 4:23 PM Indications: pain Details: 22 G 1.5 in needle, anterolateral approach Medications: 4 mL lidocaine  1 %; 2 mL triamcinolone acetonide 40 MG/ML Outcome: tolerated well, no immediate complications Procedure, treatment alternatives, risks and benefits explained, specific risks discussed. Consent was given by the patient. Immediately prior to procedure a time out was called to verify the correct patient, procedure, equipment, support staff and site/side marked as required. Patient was prepped and draped in the usual sterile fashion.      Plan: Injection successfully performed today.  Return to clinic as needed    I personally saw and evaluated the patient, and participated in the management and treatment plan.  Sharrell Deck, PA-C Orthopedics

## 2024-02-04 ENCOUNTER — Other Ambulatory Visit (HOSPITAL_COMMUNITY): Payer: Self-pay | Admitting: Physician Assistant

## 2024-02-12 ENCOUNTER — Other Ambulatory Visit: Payer: Self-pay

## 2024-02-12 DIAGNOSIS — M1711 Unilateral primary osteoarthritis, right knee: Secondary | ICD-10-CM

## 2024-02-19 ENCOUNTER — Ambulatory Visit: Admitting: Physician Assistant

## 2024-02-19 ENCOUNTER — Encounter: Payer: Self-pay | Admitting: Physician Assistant

## 2024-02-19 DIAGNOSIS — M1711 Unilateral primary osteoarthritis, right knee: Secondary | ICD-10-CM

## 2024-02-19 NOTE — Progress Notes (Signed)
 Office Visit Note   Patient: [name]           Date of Birth: January 16, 1966           MRN: [id number] Visit Date: 02/19/2024              Requested by: [name], MD [address] [place],  [place] [address] PCP: [name], MD      HPI: Patient is a pleasant 58 year old gentleman who comes in today for bilateral Durolane injections.  Assessment & Plan: Visit Diagnoses:  1. Unilateral primary osteoarthritis, right knee     Plan: Patient just prior to injecting said he thinks he had a reaction to the gel medication several years ago with Dr. Jerri.  In 2017 apparently he had a Synvisc 1 injection and had a reaction in the right knee with fluid consistent with an inflammatory synovitis.  He recovered from this well but had a lot of loss of motion initially because of the effusion.  I had a long talk with him the dural line has less of a side effect profile and is a smaller volume however I did warn him he could get a similar reaction.  Discussed again the benefits versus the risks.  He would like to go forward with a duraline injection.  He says his left knee does not really hurt so he is not sure if he should get the injection.  Given this reaction and the fact that he is asymptomatic in his left knee I would defer from injecting the left knee today  Follow-Up Instructions: Return if symptoms worsen or fail to improve.   Ortho Exam  Patient is alert, oriented, no adenopathy, well-dressed, normal affect, normal respiratory effort. Examination of his right knee has no effusion no erythema compartments are soft and compressible he is neurovascular intact    Imaging: No results found. No images are attached to the encounter.  Labs: Lab Results  Component Value Date   HGBA1C 5.7 (A) 09/06/2020   HGBA1C 5.8 (A) 03/22/2019   HGBA1C 5.6 01/22/2017   ESRSEDRATE 1 01/23/2017   CRP <0.8 01/23/2017   LABURIC 6.9 10/25/2021   LABURIC 7.1 02/03/2017   LABURIC  7.2 09/11/2015   REPTSTATUS 07/22/2016 FINAL 07/17/2016   REPTSTATUS 07/17/2016 FINAL 07/17/2016   GRAMSTAIN  07/17/2016    ABUNDANT WBC PRESENT, PREDOMINANTLY PMN NO ORGANISMS SEEN    CULT NO GROWTH 5 DAYS 07/17/2016     Lab Results  Component Value Date   ALBUMIN 4.1 03/26/2022   ALBUMIN 4.5 01/07/2022   ALBUMIN 4.2 05/19/2019    Lab Results  Component Value Date   MG 2.0 11/05/2023   MG 2.1 05/09/2023   MG 2.1 07/22/2022   No results found for: VD25OH  No results found for: PREALBUMIN    Latest Ref Rng & Units 01/13/2023    2:03 PM 09/20/2022    9:47 AM 03/26/2022    2:58 PM  CBC EXTENDED  WBC 3.4 - 10.8 x10E3/uL 10.5  7.3  6.5   RBC 4.14 - 5.80 x10E6/uL 4.71  4.96  4.86   Hemoglobin 13.0 - 17.7 g/dL 87.1  86.3  87.0   HCT 37.5 - 51.0 % 39.2  41.1  39.2   Platelets 150 - 450 x10E3/uL 230  229  216   NEUT# 1.7 - 7.7 K/uL   3.2   Lymph# 0.7 - 4.0 K/uL   2.5      There is no height  or weight on file to calculate BMI.  Orders:  No orders of the defined types were placed in this encounter.  No orders of the defined types were placed in this encounter.    Procedures: No procedures performed  Clinical Data: No additional findings.  ROS:  All other systems negative, except as noted in the HPI. Review of Systems  Objective: Vital Signs: There were no vitals taken for this visit.  Specialty Comments:  No specialty comments available.  PMFS History: Patient Active Problem List   Diagnosis Date Noted   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   Lumbar radiculopathy 12/28/2023   Esophageal reflux 12/28/2023   Encounter for screening for other metabolic disorders 12/28/2023   Encounter for monitoring dofetilide  therapy 11/05/2023   Nasal congestion 03/25/2023   Blepharitis of lower eyelids of both eyes 02/19/2023   Kidney cysts 05/21/2022   Left upper quadrant abdominal pain 05/21/2022   Acute left ankle pain 10/25/2021   Chronic  left-sided low back pain without sciatica 09/05/2020   Abnormal stress test 11/27/2017   Muscle fasciculation 11/27/2017   Routine adult health maintenance 09/12/2017   Unilateral primary osteoarthritis, right knee 07/23/2016   Plantar fasciitis of left foot 05/07/2016   Palpitations 01/12/2016   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/05/2016   Disequilibrium 03/21/2015   OSA on CPAP    PAF (paroxysmal atrial fibrillation) (HCC)    Chest pain 03/03/2015   Pain in left knee 10/13/2014   Pain in right knee 11/10/2012   Lateral epicondylitis of elbow 04/13/2012   Somnolence 11/25/2011   Pain in left hip 06/17/2011   Obstructive sleep apnea 05/17/2011   Dorsalgia 10/29/2010   Insomnia 09/19/2008   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   Esophageal reflux 10/02/2006   Qualifier: Diagnosis of   By: Geralene Service       Qualifier: Diagnosis of  By: Geralene Service    Qualifier: Diagnosis of  By: Geralene Service   Last Assessment & Plan:  Stable, refill omeprazole      GERD (gastroesophageal reflux disease)    Gout    no flares in awhile (01/22/2017)   Gout 05/13/2007   History of MRI of cervical spine 09/25/2010   normal; left shoulder normal as well   Hyperlipidemia 10/02/2006   03/2014 - ASCVD risk score of 5.1%        Hypertension    HYPERTENSION, BENIGN SYSTEMIC 10/02/2006   INSOMNIA 09/19/2008   Qualifier: Diagnosis of   By: Gerlene NP, Devere          Left groin pain 01/02/2018   Left sided numbness    Migraine    ~ q 6 months (01/22/2017)   Migraine 10/02/2006   Qualifier: Diagnosis of   By: Geralene Service         Morbid obesity (HCC)    I've lost 43# in 15 weeks w/weight watchers (01/22/2017)   Morbid obesity (HCC) 10/02/2006  Qualifier: Diagnosis of   By: Geralene Service         Muscle fasciculation    OSA (obstructive sleep apnea) 05/17/2011   OSA on CPAP    Paroxysmal atrial fibrillation (HCC) 02/2014   chads2vasc score is 1   Plantar fasciitis of right foot 05/07/2016   Primary osteoarthritis of right knee 07/23/2016   Shortness of breath 03/21/2015   Sinusitis 10/23/2012   Symptomatic sinus bradycardia    Urinary incontinence 01/02/2018    Family History  Problem Relation Age of Onset   Atrial fibrillation Mother 71       had a stroke   Hypertension Father    Diabetes Father    Colon cancer Neg Hx    Esophageal cancer Neg Hx    Stomach cancer Neg Hx     Past Surgical History:  Procedure Laterality Date   ATRIAL FIBRILLATION ABLATION N/A 01/30/2023   Procedure: ATRIAL FIBRILLATION ABLATION;  Surgeon: Inocencio Soyla Lunger, MD;  Location: [facility] INVASIVE CV LAB;  Service: Cardiovascular;  Laterality: N/A;   CARDIAC CATHETERIZATION  05/2005   no blockage   KNEE ARTHROSCOPY Right 02/2013   LEFT HEART CATH AND CORONARY ANGIOGRAPHY N/A 01/27/2017   Procedure: Left Heart Cath and Coronary Angiography;  Surgeon: Burnard Debby LABOR, MD;  Location: [facility] INVASIVE CV LAB;  Service: Cardiovascular;  Laterality: N/A;   Social History   Occupational History   Not on file  Tobacco Use   Smoking status: Former    Current packs/day: 0.00    Average packs/day: 1 pack/day for 10.0 years (10.0 ttl pk-yrs)    Types: Cigarettes    Start date: 04/02/1984    Quit date: 04/02/1994    Years since quitting: 29.9   Smokeless tobacco: Former    Types: Chew   Tobacco comments:    Former smoker and chew 07/15/22  Vaping Use   Vaping  status: Never Used  Substance and Sexual Activity   Alcohol use: No    Alcohol/week: 0.0 standard drinks of alcohol    Comment: stopped in 1994   Drug use: Not Currently    Types: Marijuana    Comment: stopped marijuna in 1994   Sexual activity: Not Currently

## 2024-03-25 ENCOUNTER — Ambulatory Visit

## 2024-03-25 VITALS — BP 135/86 | HR 76 | Ht 74.0 in | Wt 320.4 lb

## 2024-03-25 DIAGNOSIS — L080 Pyoderma: Secondary | ICD-10-CM | POA: Diagnosis not present

## 2024-03-25 DIAGNOSIS — Z1211 Encounter for screening for malignant neoplasm of colon: Secondary | ICD-10-CM | POA: Diagnosis not present

## 2024-03-25 NOTE — Patient Instructions (Signed)
 Thank you for coming in today! Here is a summary of what we discussed:  -Please keep an eye on the rash and let us  know if things get worse or if the spots don't resolve on their own  -Schedule your colonoscopy to help detect colon cancer. The stomach doctors will call to schedule an appt with you. If you decide against this, please ask about the cologuard as an option.      Please call the clinic at [phone] if your symptoms worsen or you have any concerns.  Best, Dr Adele

## 2024-03-25 NOTE — Progress Notes (Signed)
    SUBJECTIVE:   CHIEF COMPLAINT / HPI:   Lesions on body [place] --first lesions on RUE, then L hip then R hip --have appeared at different times --not painful currently, but are painful when they first begin --no itching --no fevers --no allergies --tried new body wash, no other exposures --lesions have erupted spontaneously --no lesions on palms/soles, no oral lesions --no new meds --no new sexual partners  PERTINENT  PMH / PSH: A fib, OSA, GERD, lumbar radiculopathy, OA R knee  OBJECTIVE:   BP 135/86   Pulse 76   Ht 6' 2 (1.88 m)   Wt (!) 320 lb 6.4 oz (145.3 kg)   SpO2 100%   BMI 41.14 kg/m   General: Awake conversant, no acute distress Respiratory: Normal work of breathing on room air Skin: Warm, dry, multiple 1 to 4 cm erythematous lesions, 1 pustular lesion on left lower back, mildly erythematous spot on LUE Neuro: No focal deficits, normal gait Psych: Appropriate mood and affect         ASSESSMENT/PLAN:   Assessment & Plan Pustular rash Several erythematous lesions on bilateral lower back and arm in various stages of healing.  1 pustular lesion present, patient states this is the most recent one.  Reassuringly minimal pain, no pruritus, no systemic symptoms, no abnormal exposures.  Possibly related to swimming [place] water a few weeks ago versus insect bite versus other unknown etiology.  VSS and appears well on exam. --Will continue to monitor, instructed patient to contact clinic if symptoms persist or worsen Colon cancer screening Due for colonoscopy. - Ambulatory referral to Gastroenterology    Rea Raring, MD [facility]

## 2024-04-01 ENCOUNTER — Other Ambulatory Visit (HOSPITAL_COMMUNITY): Payer: Self-pay | Admitting: Physician Assistant

## 2024-05-03 ENCOUNTER — Ambulatory Visit (HOSPITAL_COMMUNITY)
Admission: RE | Admit: 2024-05-03 | Discharge: 2024-05-03 | Disposition: A | Discharge status: Home / Self Care | Source: Ambulatory Visit | Attending: Physician Assistant | Admitting: Physician Assistant

## 2024-05-03 VITALS — BP 130/78 | HR 74 | Ht 74.0 in | Wt 325.2 lb

## 2024-05-03 DIAGNOSIS — Z79899 Other long term (current) drug therapy: Secondary | ICD-10-CM | POA: Diagnosis not present

## 2024-05-03 DIAGNOSIS — I480 Paroxysmal atrial fibrillation: Secondary | ICD-10-CM

## 2024-05-03 DIAGNOSIS — I4891 Unspecified atrial fibrillation: Secondary | ICD-10-CM

## 2024-05-03 DIAGNOSIS — Z5181 Encounter for therapeutic drug level monitoring: Secondary | ICD-10-CM | POA: Diagnosis not present

## 2024-05-03 NOTE — Progress Notes (Signed)
 Primary Care Physician: [name], MD Referring Physician: Dr. Kelsie, [facility] f/u Cardiologist: Dr. Alveta Primary EP: Dr Inocencio [name] is a 58 y.o. male with a h/o PAF on  tikosyn . He feels that mostly he is staying in SR. He did have afib last night after eating a high salt dinner at [facility]. He is in normal rhythm today. He is being compliant with Tikosyn / eliquis . No bleeding issues.  He is using cpap. He is back at [employer] on the machines with social distancing in place.   F/u in afib clinic, 10/26/29. He asked to be seen today as he will notice fluttering in his chest but his HR will be lower in the 40's. He did notice one episode of afib with HR's in the 150's x 1. He notices more shortness of breath when he has the fluttering/slow HR spells. No change in health. Eye drop Xiidra, added 2 weeks ago for dry eye. Continues on eliquis  5 mg bid for a CHA2DS2VASc score of 1.   F/u in the afib clinic, 5/3,  for f/u ER visit 4/7,  for chest pain. It was not felt to be cardiac in origin. He was in afib in the ER but converted. The  chest pain  was not associated with afib over the preceding days at home with same. Dr. Wonda saw on consult in the ER and cleared to go home. He wore a monitor that showed 7% afib burden. He feels the chest pain was from stress dealing with his job. 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 He is not an ablation candidate for his weight at 328 lbs today. He is   back on [facility] for his weight loss efforts. He lost 50 lbs one time doing [employer], but went on vacation and got off [employer] at that time.   F/u afib clinic, 12/12/20. He is in SR today. He has ongoing   atypical chest pain. Recent cardiac CT showed no significant coronary disease so doubt coronary origin. He tells me he has recently reduced his omeprazole  to once a day and has noted citric food will make it worse. He has also increased coffee intake. He  denies ever having an endoscopy. He does lift heavy weights and another origin of pain may be MS. He often will notice on sitting to standing. He remains on tikosyn  and eliquis .   Follow up in the AF clinic 08/27/21. Patient reports that he has done well since his last visit. He did have one episode of afib starting in the evening and lasting into the next day. He denies any bleeding issues on anticoagulation.   F/u in afib clinic, 11/22/21. Pt is here as he developed some lightheadedness with left arm feeling tingly, and he felt that he had some palpitations. Someone at work felt  his pulse and said he had some skips. He has noted at work if he lifts his arm above his head for work related activities he feels a tingle and a pull across his chest. He has been evaluated for atypical chest pain in the ER before without significant findings. A CT score was low and a LHC in 2018 did not show significant CAD.   He states that his HR's were in the 60-80's at work yesterday . He is in SR today and has no complaints. He states that he has had to pick  up another job as his hours have been cut back with his current job.   Follow up in the AF clinic 07/15/22. Patient reports that he has had more frequent afib recently. He states that he has an episode about every other day which can last for several hours. His wife was recently diagnosed with breast cancer and had surgery earlier this week. No bleeding issues on anticoagulation. He denies any dizziness or presyncope.   Follow up in the AF clinic 09/24/22. Patient was seen in the ED 09/20/22 with afib and chest pain. His symptoms improved with rate control. He converted back to SR after discharge and feels well today. He has been dealing with a sinus infection recently.   Follow up in the AF clinic 03/06/23. Patient is s/p afib ablation with Dr Inocencio on 01/30/23. Patient reports that his afib burden on his Apple watch has decreased from 30% to 5%. He denies any ongoing  chest pain, swallowing pain, or groin issues.   Follow up 11/05/23. Patient returns for follow up for atrial fibrillation and dofetilide  monitoring. Patient reports that he has done well since his last visit. He will occasionally have a flutter in his chest associated with eating a large meal but these are brief and infrequent. No bleeding issues on anticoagulation.   Follow up 05/03/24. Patient returns for follow up for atrial fibrillation and dofetilide  monitoring. He reports that he has done well from a cardiac standpoint. His primary concerns are about his chronic knee and back pain. No bleeding issues on anticoagulation.   Today, he  denies symptoms of palpitations, chest pain, shortness of breath, orthopnea, PND, lower extremity edema, dizziness, presyncope, syncope, bleeding, or neurologic sequela. The patient is tolerating medications without difficulties and is otherwise without complaint today.    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   Esophageal reflux 10/02/2006   Qualifier: Diagnosis of   By: Geralene Service       Qualifier: Diagnosis of  By: Geralene Service    Qualifier: Diagnosis of  By: Geralene Service   Last Assessment & Plan:  Stable, refill omeprazole      GERD (gastroesophageal reflux disease)    Gout    no flares in awhile (01/22/2017)   Gout 05/13/2007   History of MRI of cervical spine 09/25/2010   normal; left shoulder normal as well   Hyperlipidemia 10/02/2006   03/2014  - ASCVD risk score of 5.1%        Hypertension    HYPERTENSION, BENIGN SYSTEMIC 10/02/2006   INSOMNIA 09/19/2008   Qualifier: Diagnosis of   By: Gerlene NP, Devere         Left groin pain 01/02/2018   Left sided numbness    Migraine    ~ q 6 months (01/22/2017)   Migraine 10/02/2006   Qualifier: Diagnosis of   By: Geralene Service         Morbid obesity (HCC)    I've lost 43# in 15 weeks  w/weight watchers (01/22/2017)   Morbid obesity (HCC) 10/02/2006   Qualifier: Diagnosis of   By: Geralene Service         Muscle fasciculation    OSA (obstructive sleep apnea) 05/17/2011   OSA on CPAP    Paroxysmal atrial fibrillation (HCC) 02/2014   chads2vasc score is 1   Plantar fasciitis of right foot 05/07/2016   Primary osteoarthritis of right knee 07/23/2016   Shortness of breath 03/21/2015   Sinusitis 10/23/2012   Symptomatic sinus bradycardia    Urinary incontinence 01/02/2018    ROS- All systems are reviewed and negative except as per the HPI above  Physical Exam: Vitals:   05/03/24 1431  BP: 130/78  Pulse: 74  Weight: (!) 147.5 kg  Height: 6' 2 (1.88 m)     GEN: Well nourished, well developed in no acute distress CARDIAC: Regular rate and rhythm, no murmurs, rubs, gallops RESPIRATORY:  Clear to auscultation without rales, wheezing or rhonchi  ABDOMEN: Soft, non-tender, non-distended EXTREMITIES:  No edema; No deformity    ECG today demonstrates SR with short PR vs ectopic atrial rhythm Vent. rate 74 BPM PR interval 112 ms QRS duration 96 ms QT/QTcB 392/435 ms   CHA2DS2-VASc Score = 1  The patient's score is based upon: CHF History: 0 HTN History: 1 Diabetes History: 0 Stroke History: 0 Vascular Disease History: 0 Age Score: 0 Gender Score: 0       ASSESSMENT AND PLAN: Paroxysmal Atrial Fibrillation (ICD10:  I48.0) The patient's CHA2DS2-VASc score is 1, indicating a 0.6% annual risk of stroke.   S/p afib ablation 01/30/23 Patient appears to be  maintaining SR Continue dofetilide  500 mcg BID Continue Eliquis  5 mg BID   High Risk Medication Monitoring (ICD 10: Z79.899) Patient requires ongoing monitoring for anti-arrhythmic medication which has the potential to cause life threatening arrhythmias. QT interval on ECG acceptable for dofetilide  monitoring. Check bmet/mag today.     Obesity Body mass index is 41.75 kg/m.  Encouraged lifestyle modification We discussed referral to [facility] clinic, he has deferred for now.  He plans to discuss possibly starting GLP-1 antagonist with his PCP.  HTN Stable on current regimen  OSA  Encouraged nightly CPAP   Follow up in the AF clinic in 6 months.    Daril Kicks PA-C Af[facility] [address] [place], [place] [address] [phone]

## 2024-05-04 ENCOUNTER — Ambulatory Visit (HOSPITAL_COMMUNITY): Payer: Self-pay | Admitting: Physician Assistant

## 2024-05-04 LAB — BASIC METABOLIC PANEL WITH GFR
BUN/Creatinine Ratio: 9 — ABNORMAL LOW (ref 10–24)
BUN: 9 mg/dL (ref 8–27)
CO2: 22 mmol/L (ref 20–29)
Calcium: 9.4 mg/dL (ref 8.6–10.2)
Chloride: 104 mmol/L (ref 96–106)
Creatinine, Ser: 0.96 mg/dL (ref 0.76–1.27)
Glucose: 85 mg/dL (ref 70–99)
Potassium: 4.1 mmol/L (ref 3.5–5.2)
Sodium: 139 mmol/L (ref 134–144)
eGFR: 90 mL/min/1.73 (ref >59)

## 2024-05-04 LAB — MAGNESIUM: Magnesium: 2 mg/dL (ref 1.6–2.3)

## 2024-05-21 ENCOUNTER — Other Ambulatory Visit: Payer: Self-pay | Admitting: Cardiology

## 2024-05-21 ENCOUNTER — Other Ambulatory Visit: Payer: Self-pay | Admitting: Family Medicine

## 2024-06-07 ENCOUNTER — Encounter: Payer: Self-pay | Admitting: Radiology

## 2024-06-20 ENCOUNTER — Other Ambulatory Visit: Payer: Self-pay | Admitting: Physician Assistant

## 2024-07-14 ENCOUNTER — Other Ambulatory Visit: Payer: Self-pay | Admitting: Neurosurgery

## 2024-07-14 DIAGNOSIS — M25552 Pain in left hip: Secondary | ICD-10-CM

## 2024-07-17 ENCOUNTER — Inpatient Hospital Stay: Admission: RE | Admit: 2024-07-17 | Discharge: 2024-07-17 | Attending: Neurosurgery

## 2024-07-17 DIAGNOSIS — M25552 Pain in left hip: Secondary | ICD-10-CM

## 2024-07-18 NOTE — Progress Notes (Deleted)
 [facility] Return Visit   Referring Provider [name], MD [address] [place],  [place] [address]  Primary Care Provider [name], MD  Patient Profile: [name] is a 58 y.o. male who returns to the [facility] for follow-up of the problem(s) noted below.  Problem List: GERD History of adenomatous colon polyps Colonic diverticulosis Internal hemorrhoids  History of Present Illness     Current GI Meds    Interval History   Discussed the use of AI scribe software for clinical note transcription with the patient, who gave verbal consent to proceed.  History of Present Illness [name] is a 59 year old gentleman with a past medical history noteworthy for atrial fibrillation (on Eliquis ), HTN, OSA on CPAP, obesity, degenerative joint disease, migraine    Last colonoscopy: *** Last endoscopy: ***  Last Abd CT/CTE/MRE: ***  GI Review of Symptoms Significant for {GIROS:50592}. Otherwise negative.  General Review of Systems  Review of systems is significant for the pertinent positives and negatives as listed per the HPI.  Full ROS is otherwise negative.  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   Esophageal reflux 10/02/2006   Qualifier: Diagnosis of   By: Geralene Service       Qualifier: Diagnosis of  By: Geralene Service    Qualifier: Diagnosis of  By: Geralene Service   Last  Assessment & Plan:  Stable, refill omeprazole      GERD (gastroesophageal reflux disease)    Gout    no flares in awhile (01/22/2017)   Gout 05/13/2007   History of MRI of cervical spine 09/25/2010   normal; left shoulder normal as well   Hyperlipidemia 10/02/2006   03/2014 - ASCVD risk score of 5.1%        Hypertension    HYPERTENSION, BENIGN SYSTEMIC 10/02/2006   INSOMNIA 09/19/2008   Qualifier: Diagnosis of   By: Gerlene NP, Devere         Left groin pain 01/02/2018   Left sided numbness    Migraine    ~ q 6 months (01/22/2017)   Migraine 10/02/2006   Qualifier: Diagnosis of   By: Geralene Service         Morbid obesity (HCC)    I've lost 43# in 15 weeks w/weight watchers (01/22/2017)   Morbid obesity (HCC) 10/02/2006   Qualifier: Diagnosis of   By: Geralene Service         Muscle fasciculation    OSA (obstructive sleep apnea) 05/17/2011   OSA on CPAP    Paroxysmal atrial fibrillation (HCC) 02/2014   chads2vasc score is 1   Plantar fasciitis of right foot 05/07/2016   Primary osteoarthritis of right knee 07/23/2016   Shortness of breath 03/21/2015   Sinusitis 10/23/2012   Symptomatic sinus bradycardia    Urinary incontinence 01/02/2018     Past Surgical History   Past Surgical History:  Procedure Laterality Date   ATRIAL FIBRILLATION  ABLATION N/A 01/30/2023   Procedure: ATRIAL FIBRILLATION ABLATION;  Surgeon: Inocencio Soyla Lunger, MD;  Location: [facility] INVASIVE CV LAB;  Service: Cardiovascular;  Laterality: N/A;   CARDIAC CATHETERIZATION  05/2005   no blockage   KNEE ARTHROSCOPY Right 02/2013   LEFT HEART CATH AND CORONARY ANGIOGRAPHY N/A 01/27/2017   Procedure: Left Heart Cath and Coronary Angiography;  Surgeon: Burnard Debby LABOR, MD;  Location: [facility] INVASIVE CV LAB;  Service: Cardiovascular;  Laterality: N/A;     Allergies and Medications   Allergies[1] @MEDSTODAY @  Family His   Family History  Problem Relation Age of Onset   Atrial fibrillation Mother 73        had a stroke   Hypertension Father    Diabetes Father    Colon cancer Neg Hx    Esophageal cancer Neg Hx    Stomach cancer Neg Hx    GI Specific Family History: {gifamhx:50061}   Social History   Social History[2] Augusta reports that he quit smoking about 30 years ago. His smoking use included cigarettes. He started smoking about 40 years ago. He has a 10 pack-year smoking history. He has quit using smokeless tobacco.  His smokeless tobacco use included chew. He reports that he does not currently use drugs after having used the following drugs: Marijuana. He reports that he does not drink alcohol.  Vital Signs and Physical Examination   There were no vitals filed for this visit. There is no height or weight on file to calculate BMI.    General: Well developed, well nourished, no acute distress Head: Normocephalic and atraumatic Eyes: Sclerae anicteric, EOMI Ears: Normal auditory acuity Mouth: No deformities or lesions noted Lungs: Clear throughout to auscultation Heart: Regular rate and rhythm; No murmurs, rubs or bruits Abdomen: Soft, non tender and non distended. No masses, hepatosplenomegaly or hernias noted. Normal Bowel sounds Rectal: Musculoskeletal: Symmetrical with no gross deformities  Pulses:  Normal pulses noted Extremities: No edema or deformities noted Neurological: Alert oriented x 4, grossly nonfocal Psychological:  Alert and cooperative. Normal mood and affect   Review of Data   The following data was reviewed at the time of this encounter:   Laboratory Studies      Latest Ref Rng & Units 01/13/2023    2:03 PM 09/20/2022    9:47 AM 03/26/2022    2:58 PM  CBC  WBC 3.4 - 10.8 x10E3/uL 10.5  7.3  6.5   Hemoglobin 13.0 - 17.7 g/dL 87.1  86.3  87.0   Hematocrit 37.5 - 51.0 % 39.2  41.1  39.2   Platelets 150 - 450 x10E3/uL 230  229  216     Lab Results  Component Value Date   LIPASE 42 03/26/2022      Latest Ref Rng & Units 05/03/2024    3:24 PM  11/05/2023    2:39 PM 01/13/2023    2:03 PM  CMP  Glucose 70 - 99 mg/dL 85  889  899   BUN 8 - 27 mg/dL 9  12  16    Creatinine 0.76 - 1.27 mg/dL 9.03  9.10  9.00   Sodium 134 - 144 mmol/L 139  139  140   Potassium 3.5 - 5.2 mmol/L 4.1  3.7  4.2   Chloride 96 - 106 mmol/L 104  107  105   CO2 20 - 29 mmol/L 22  24  23    Calcium  8.6 - 10.2 mg/dL 9.4  9.1  9.2      Imaging  Studies    GI Procedures and Studies  EGD and colonoscopy 03/12/2021 EGD-normal esophagus and stomach -esophageal biopsies consistent with GERD -no Barrett's or EOE; mild chronic gastritis without H. pylori, normal duodenal biopsies Colonoscopy -internal hemorrhoids, 5 polyps largest measuring 10 mm -all tubular adenomas    Clinical Impression  It is my clinical impression that Mr. Welliver is a 58 y.o. male with;  ***  Plan  *** *** *** *** ***   Planned Follow Up No follow-ups on file.  The patient or caregiver verbalized understanding of the material covered, with no barriers to understanding. All questions were answered. Patient or caregiver is agreeable with the plan outlined above.    It was a pleasure to see Verlan.  If you have any questions or concerns regarding this evaluation, do not hesitate to contact me.  Inocente Hausen, MD [facility]     [1]  Allergies Allergen Reactions   Hydrochlorothiazide-Propranolol  [Propranolol -Hctz] Other (See Comments)    Joints lock up   Penicillins Other (See Comments)    Hives Makes joints stiff and unable to move Has patient had a PCN reaction causing immediate rash, facial/tongue/throat swelling, SOB or lightheadedness with hypotension: No Has patient had a PCN reaction causing severe rash involving mucus membranes or skin necrosis: No Has patient had a PCN reaction that required hospitalization No Has patient had a PCN reaction occurring within the last 10 years: No If all of the above answers are NO, then may proceed with Cephalosporin use.   [2]  Social History Tobacco Use   Smoking status: Former    Current packs/day: 0.00    Average packs/day: 1 pack/day for 10.0 years (10.0 ttl pk-yrs)    Types: Cigarettes    Start date: 04/02/1984    Quit date: 04/02/1994    Years since quitting: 30.3   Smokeless tobacco: Former    Types: Chew   Tobacco comments:    Former smoker and chew 07/15/22  Vaping Use   Vaping status: Never Used  Substance Use Topics   Alcohol use: No    Alcohol/week: 0.0 standard drinks of alcohol    Comment: stopped in 1994   Drug use: Not Currently    Types: Marijuana    Comment: stopped marijuna in 1994

## 2024-07-20 ENCOUNTER — Other Ambulatory Visit (INDEPENDENT_AMBULATORY_CARE_PROVIDER_SITE_OTHER)

## 2024-07-20 ENCOUNTER — Encounter: Payer: Self-pay | Admitting: Pediatrics

## 2024-07-20 ENCOUNTER — Ambulatory Visit: Admitting: Pediatrics

## 2024-07-20 ENCOUNTER — Telehealth: Payer: Self-pay

## 2024-07-20 VITALS — BP 132/80 | HR 73 | Ht 74.0 in | Wt 322.2 lb

## 2024-07-20 DIAGNOSIS — Z860101 Personal history of adenomatous and serrated colon polyps: Secondary | ICD-10-CM

## 2024-07-20 DIAGNOSIS — K219 Gastro-esophageal reflux disease without esophagitis: Secondary | ICD-10-CM

## 2024-07-20 DIAGNOSIS — K5792 Diverticulitis of intestine, part unspecified, without perforation or abscess without bleeding: Secondary | ICD-10-CM

## 2024-07-20 DIAGNOSIS — K760 Fatty (change of) liver, not elsewhere classified: Secondary | ICD-10-CM

## 2024-07-20 DIAGNOSIS — K648 Other hemorrhoids: Secondary | ICD-10-CM | POA: Diagnosis not present

## 2024-07-20 DIAGNOSIS — Z860100 Personal history of colon polyps, unspecified: Secondary | ICD-10-CM

## 2024-07-20 LAB — COMPREHENSIVE METABOLIC PANEL WITH GFR
ALT: 26 U/L (ref 3–53)
AST: 27 U/L (ref 5–37)
Albumin: 4.4 g/dL (ref 3.5–5.2)
Alkaline Phosphatase: 84 U/L (ref 39–117)
BUN: 15 mg/dL (ref 6–23)
CO2: 28 meq/L (ref 19–32)
Calcium: 9.5 mg/dL (ref 8.4–10.5)
Chloride: 104 meq/L (ref 96–112)
Creatinine, Ser: 1.03 mg/dL (ref 0.40–1.50)
GFR: 79.09 mL/min (ref >60.00)
Glucose, Bld: 124 mg/dL — ABNORMAL HIGH (ref 70–99)
Potassium: 3.7 meq/L (ref 3.5–5.1)
Sodium: 140 meq/L (ref 135–145)
Total Bilirubin: 0.5 mg/dL (ref 0.2–1.2)
Total Protein: 7.3 g/dL (ref 6.0–8.3)

## 2024-07-20 LAB — CBC WITH DIFFERENTIAL/PLATELET
Basophils Absolute: 0.1 K/uL (ref 0.0–0.1)
Basophils Relative: 0.9 % (ref 0.0–3.0)
Eosinophils Absolute: 0.2 K/uL (ref 0.0–0.7)
Eosinophils Relative: 3.8 % (ref 0.0–5.0)
HCT: 40.4 % (ref 39.0–52.0)
Hemoglobin: 13.3 g/dL (ref 13.0–17.0)
Lymphocytes Relative: 34.7 % (ref 12.0–46.0)
Lymphs Abs: 2.2 K/uL (ref 0.7–4.0)
MCHC: 33 g/dL (ref 30.0–36.0)
MCV: 79.5 fl (ref 78.0–100.0)
Monocytes Absolute: 0.4 K/uL (ref 0.1–1.0)
Monocytes Relative: 6.8 % (ref 3.0–12.0)
Neutro Abs: 3.5 K/uL (ref 1.4–7.7)
Neutrophils Relative %: 53.8 % (ref 43.0–77.0)
Platelets: 209 K/uL (ref 150.0–400.0)
RBC: 5.08 Mil/uL (ref 4.22–5.81)
RDW: 14.4 % (ref 11.5–15.5)
WBC: 6.4 K/uL (ref 4.0–10.5)

## 2024-07-20 LAB — PROTIME-INR
INR: 1.1 ratio — ABNORMAL HIGH (ref 0.8–1.0)
Prothrombin Time: 11.9 s (ref 9.6–13.1)

## 2024-07-20 LAB — GAMMA GT: GGT: 22 U/L (ref 7–51)

## 2024-07-20 MED ORDER — NA SULFATE-K SULFATE-MG SULF 17.5-3.13-1.6 GM/177ML PO SOLN: 1.0000 | Freq: Once | ORAL | 0 refills | Status: AC

## 2024-07-20 NOTE — Patient Instructions (Addendum)
 Your provider has requested that you go to the basement level for lab work before leaving today. Press B on the elevator. The lab is located at the first door on the left as you exit the elevator.  Due to recent changes in healthcare laws, you may see the results of your imaging and laboratory studies on MyChart before your provider has had a chance to review them.  We understand that in some cases there may be results that are confusing or concerning to you. Not all laboratory results come back in the same time frame and the provider may be waiting for multiple results in order to interpret others.  Please give us  48 hours in order for your provider to thoroughly review all the results before contacting the office for clarification of your results.    You have been scheduled for a colonoscopy. Please follow written instructions given to you at your visit today.   If you use inhalers (even only as needed), please bring them with you on the day of your procedure.  DO NOT TAKE 7 DAYS PRIOR TO TEST- Trulicity (dulaglutide) Ozempic, Wegovy (semaglutide) Mounjaro, Zepbound (tirzepatide) Bydureon Bcise (exanatide extended release)  DO NOT TAKE 1 DAY PRIOR TO YOUR TEST Rybelsus (semaglutide) Adlyxin (lixisenatide) Victoza (liraglutide) Byetta (exanatide) ___________________________________________________________________________  Thank you for entrusting me with your care and for choosing [facility], Dr. Inocente Hausen  _______________________________________________________  If your blood pressure at your visit was 140/90 or greater, please contact your primary care physician to follow up on this.  _______________________________________________________  If you are age 60 or older, your body mass index should be between 23-30. Your Body mass index is 41.37 kg/m. If this is out of the aforementioned range listed, please consider follow up with your Primary Care Provider.  If you  are age 31 or younger, your body mass index should be between 19-25. Your Body mass index is 41.37 kg/m. If this is out of the aformentioned range listed, please consider follow up with your Primary Care Provider.   ________________________________________________________  The  GI providers would like to encourage you to use MYCHART to communicate with providers for non-urgent requests or questions.  Due to long hold times on the telephone, sending your provider a message by [facility] may be a faster and more efficient way to get a response.  Please allow 48 business hours for a response.  Please remember that this is for non-urgent requests.  _______________________________________________________  [facility] is using a team-based approach to care.  Your team is made up of your doctor and two to three APPS. Our APPS (Nurse Practitioners and Physician Assistants) work with your physician to ensure care continuity for you. They are fully qualified to address your health concerns and develop a treatment plan. They communicate directly with your gastroenterologist to care for you. Seeing the Advanced Practice Practitioners on your physician's team can help you by facilitating care more promptly, often allowing for earlier appointments, access to diagnostic testing, procedures, and other specialty referrals.

## 2024-07-20 NOTE — Telephone Encounter (Signed)
 [facility] Pre-operative Risk Assessment     Request for surgical clearance:     Endoscopy Procedure  What type of surgery is being performed?     Colonoscopy  When is this surgery scheduled?     08/20/24  What type of clearance is required ?   Pharmacy  Are there any medications that need to be held prior to surgery and how long? Eliquis  for 2 days  Practice name and name of physician performing surgery?       Gastroenterology  What is your office phone and fax number?      Phone- [phone]  Fax- [phone]  Anesthesia type (None, local, MAC, general) ?       MAC   Please route your response to [employer], CMA

## 2024-07-20 NOTE — Progress Notes (Signed)
 [name]   Referring Provider [name], MD [address] [place],  [place] [address]  Primary Care Provider [name], MD  Patient Profile: [name] is a 58 y.o. male who returns to the [facility] for follow-up of the problem(s) noted below.  Problem List: GERD History of adenomatous colon polyps Colonic diverticulosis Internal hemorrhoids Hepatic steatosis on imaging 2023  History of Present Illness     Current GI Meds  Omeprazole  40 mg orally daily  Interval History   Discussed the use of AI scribe software for clinical note transcription with the patient, who gave verbal consent to proceed.  History of Present Illness [name] is a 59 year old gentleman with a past medical history noteworthy for atrial fibrillation (on Eliquis ), HTN, OSA on CPAP, obesity, degenerative joint disease, migraine Who returns to the gastroenterology office to discuss surveillance colonoscopy for a history of adenomatous colon polyps  Colorectal neoplasia surveillance - Colonoscopy 2022-5 colon polyps measuring less than 10 mm -all tubular adenomas - No family history of colon cancer or polyps - No complications following previous colonoscopy  - Regular bowel movements a few times daily - Denies melena, hematochezia or rectal bleeding - No nausea, vomiting, or change in bowel habits - No abdominal pain or cramping  Gastroesophageal reflux symptoms - Reflux symptoms well controlled on omeprazole  40 mg once daily - Avoids lemons, oranges, spicy foods, and limits caffeine - Drinks water at work and tea at home - No tobacco or alcohol use  Medical comorbidities Atrial fibrillation - Single episode of atrial fibrillation lasting approximately three hours - Experienced irregular heartbeats and heart rate in the high eighties for a couple of days, then returned to baseline of 60-70 bpm - Takes Tikosyn  and  Eliquis  without bleeding complications - History of prior ablation with no recurrent atrial fibrillation episodes - On Eliquis  for anticoagulation-no bleeding issues - Seen by cardiology/anticoagulation clinic 11/05/2023 without new concerns  Obstructive sleep apnea - Uses CPAP therapy - No new cardiac or pulmonary symptoms  Obesity BMI 41.37  Hepatic steatosis on imaging 2023 - No known history of prior liver disease - No history of IVDU, blood transfusion, tattoos. ETOH use - No history of viral hepatitis - No family history of liver disease - Metabolic risk factors: Obesity - Has not had updated LFTs -agreeable to labs today   GI Review of Symptoms Significant for None. Otherwise negative.  General Review of Systems  Review of systems is significant for the pertinent positives and negatives as listed per the HPI.  Full ROS is otherwise negative.  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   Esophageal reflux 10/02/2006   Qualifier: Diagnosis of   By: Geralene Service       Qualifier: Diagnosis of  By: Geralene Service    Qualifier: Diagnosis of  By: Geralene Service   Last Assessment & Plan:  Stable, refill omeprazole   GERD (gastroesophageal reflux disease)    Gout    no flares in awhile (01/22/2017)   Gout 05/13/2007   History of MRI of cervical spine 09/25/2010   normal; left shoulder normal as well   Hyperlipidemia 10/02/2006   03/2014 - ASCVD risk  score of 5.1%        Hypertension    HYPERTENSION, BENIGN SYSTEMIC 10/02/2006   INSOMNIA 09/19/2008   Qualifier: Diagnosis of   By: Gerlene NP, Devere         Left groin pain 01/02/2018   Left sided numbness    Migraine    ~ q 6 months (01/22/2017)   Migraine 10/02/2006   Qualifier: Diagnosis of   By: Geralene Service         Morbid obesity (HCC)    I've lost 43# in 15 weeks w/weight watchers (01/22/2017)   Morbid obesity (HCC) 10/02/2006   Qualifier: Diagnosis of   By: Geralene Service         Muscle fasciculation    OSA (obstructive sleep apnea) 05/17/2011   OSA on CPAP    Paroxysmal atrial fibrillation (HCC) 02/2014   chads2vasc score is 1   Plantar fasciitis of right foot 05/07/2016   Primary osteoarthritis of right knee 07/23/2016   Shortness of breath 03/21/2015   Sinusitis 10/23/2012   Symptomatic sinus bradycardia    Urinary incontinence 01/02/2018     Past Surgical History   Past Surgical History:  Procedure Laterality Date   ATRIAL FIBRILLATION ABLATION N/A 01/30/2023   Procedure: ATRIAL FIBRILLATION ABLATION;  Surgeon: Inocencio Soyla Lunger, MD;  Location: [facility] INVASIVE CV [facility];  Service: Cardiovascular;  Laterality: N/A;   CARDIAC CATHETERIZATION  05/2005   no blockage   KNEE ARTHROSCOPY Right 02/2013   LEFT HEART CATH AND CORONARY ANGIOGRAPHY N/A 01/27/2017   Procedure: Left Heart Cath and Coronary Angiography;  Surgeon: Burnard Debby LABOR, MD;  Location: [facility] INVASIVE CV LAB;  Service: Cardiovascular;  Laterality: N/A;     Allergies and Medications   Allergies[1]   atorvastatin  baclofen  clotrimazole  diclofenac  Sodium Gel dofetilide  Eliquis  Tabs Fish Oil Caps fluticasone  ibuprofen  loratadine  losartan  Mitigare  Caps montelukast  multivitamin with minerals Tabs omega-3 acid ethyl esters omeprazole  oxyCODONE  potassium chloride  sodium chloride  Soln TURMERIC PO Turmeric Root Powd Xhance  Exhu   Family His   Family History  Problem Relation Age of  Onset   Atrial fibrillation Mother 81       had a stroke   Hypertension Father    Diabetes Father    Colon cancer Neg Hx    Esophageal cancer Neg Hx    Stomach cancer Neg Hx     Social History   Social History[2] [name] reports that he quit smoking about 30 years ago. His smoking use included cigarettes. He started smoking about 40 years ago. He has a 10 pack-year smoking history. He has quit using smokeless tobacco.  His smokeless tobacco use included chew. He reports that he does not currently use drugs after having used the following drugs: Marijuana. He reports that he does not drink alcohol.  Vital Signs and Physical Examination   Vitals:   07/20/24 1312  BP: 132/80  Pulse: 73   Body mass index is 41.37 kg/m. Weight: (!) 322 lb 4 oz (146.2 kg)  General: Alert, obese, no distress Head: Normocephalic and atraumatic Eyes: Sclerae anicteric, EOMI Lungs: Clear throughout to auscultation Heart: Regular rate and rhythm; No murmurs, rubs or bruits Abdomen: Soft, non tender and non distended. No masses, hepatosplenomegaly  or hernias noted. Normal Bowel sounds Rectal: Deferred Musculoskeletal: Symmetrical with no gross deformities    Review of Data   The following data was reviewed at the time of this encounter:   Laboratory Studies      Latest Ref Rng & Units 01/13/2023    2:03 PM 09/20/2022    9:47 AM 03/26/2022    2:58 PM  CBC  WBC 3.4 - 10.8 x10E3/uL 10.5  7.3  6.5   Hemoglobin 13.0 - 17.7 g/dL 87.1  86.3  87.0   Hematocrit 37.5 - 51.0 % 39.2  41.1  39.2   Platelets 150 - 450 x10E3/uL 230  229  216     Lab Results  Component Value Date   LIPASE 42 03/26/2022      Latest Ref Rng & Units 05/03/2024    3:24 PM 11/05/2023    2:39 PM 01/13/2023    2:03 PM  CMP  Glucose 70 - 99 mg/dL 85  889  899   BUN 8 - 27 mg/dL 9  12  16    Creatinine 0.76 - 1.27 mg/dL 9.03  9.10  9.00   Sodium 134 - 144 mmol/L 139  139  140   Potassium 3.5 - 5.2 mmol/L 4.1  3.7  4.2    Chloride 96 - 106 mmol/L 104  107  105   CO2 20 - 29 mmol/L 22  24  23    Calcium  8.6 - 10.2 mg/dL 9.4  9.1  9.2    7982 HCV negative  Imaging Studies  CTAP 03/26/2022 for flank pain 1. No acute intra-abdominal or pelvic pathology. 2. Fatty liver. 3. Bilateral renal cysts.  CTAP 01/14/2018 No acute abnormality or finding to explain the patient's symptoms.  Small fat containing umbilical hernia.    GI Procedures and Studies  EGD and colonoscopy 03/12/2021 EGD-normal esophagus and stomach -esophageal biopsies consistent with GERD -no Barrett's or EOE; mild chronic gastritis without H. pylori, normal duodenal biopsies Colonoscopy -internal hemorrhoids, 5 polyps largest measuring 10 mm -all tubular adenomas  Clinical Impression  It is my clinical impression that [name] is a 58 y.o. male with;  GERD History of adenomatous colon polyps Colonic diverticulosis Internal hemorrhoids Hepatic steatosis on imaging 2023  [name] returns to the gastroenterology office today to follow-up multiple gastrointestinal issues as outlined above.  The primary impetus for his visit today is to review his health prior to upcoming surveillance colonoscopy for which he is due.  Colonoscopy 2022 disclosed 5 tubular adenomas.  No family history of colorectal cancer or polyps.  He has multiple medical comorbidities-atrial fibrillation on Tikosyn  and Eliquis , obesity and OSA on CPAP -all of which are stable.  Discussed scheduling colonoscopy at [facility] and obtaining cardiology clearance to hold Eliquis  x 2 days.  He is agreeable to this.  He also has a history of GERD currently well-controlled on omeprazole  40 mg orally daily.  He is cognizant of food triggers and has made dietary and lifestyle modifications to limit symptoms of GERD.  During our visit today, chart review disclosed evidence of hepatic steatosis/fatty liver on CT scan in 2023.  He was not aware of his history of hepatic steatosis.  Denies risk  factors for liver disease including alcohol use, IVDU, blood transfusion, tattoos, viral hepatitis.  No family history of liver disease.  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  Advised updating LFTs and future imaging pending that result.  Plan  Schedule colonoscopy at [facility] -obtain cardiology clearance  to hold Eliquis  x 2 days Continue omeprazole  40 mg orally daily Continue GERD diet lifestyle modification Labs today: CBC, CMP, GGT, PT/INR Consider updated ultrasound 2026 -sooner if LFTs are elevated If evidence of elevated LFTs will proceed with serologic evaluation for chronic hepatitides   Planned Follow Up 6-12 months pending lab and colonoscopy results  The patient or caregiver verbalized understanding of the material covered, with no barriers to understanding. All questions were answered. Patient or caregiver is agreeable with the plan outlined above.    It was a pleasure to see [name].  If you have any questions or concerns regarding this evaluation, do not hesitate to contact me.  Inocente Hausen, MD [facility]   I spent total of 30 minutes in both face-to-face (15 minutes interview) and non-face-to-face (15 minutes chart review, care coordination, documentation)  activities, excluding procedures performed, for the visit on the date of this encounter.     [1]  Allergies Allergen Reactions   Hydrochlorothiazide-Propranolol  [Propranolol -Hctz] Other (See Comments)    Joints lock up   Penicillins Other (See Comments)    Hives Makes joints stiff and unable to move Has patient had a PCN reaction causing immediate rash, facial/tongue/throat swelling, SOB or lightheadedness with hypotension: No Has patient had a PCN reaction causing severe rash involving mucus membranes or skin necrosis: No Has patient had a PCN reaction that required hospitalization No Has patient had a PCN reaction occurring within the last 10 years: No If all of the above answers  are NO, then may proceed with Cephalosporin use.  [2]  Social History Tobacco Use   Smoking status: Former    Current packs/day: 0.00    Average packs/day: 1 pack/day for 10.0 years (10.0 ttl pk-yrs)    Types: Cigarettes    Start date: 04/02/1984    Quit date: 04/02/1994    Years since quitting: 30.3   Smokeless tobacco: Former    Types: Chew   Tobacco comments:    Former smoker and chew 07/15/22  Vaping Use   Vaping status: Never Used  Substance Use Topics   Alcohol use: No    Alcohol/week: 0.0 standard drinks of alcohol    Comment: stopped in 1994   Drug use: Not Currently    Types: Marijuana    Comment: stopped marijuna in 1994

## 2024-07-21 ENCOUNTER — Ambulatory Visit: Payer: Self-pay | Admitting: Pediatrics

## 2024-07-26 NOTE — Telephone Encounter (Signed)
 Patient with diagnosis of afib on Eliquis  for anticoagulation.    Procedure: colonoscopy Date of procedure: 08/21/23   CHA2DS2-VASc Score = 1   This indicates a 0.6% annual risk of stroke. The patient's score is based upon: CHF History: 0 HTN History: 1 Diabetes History: 0 Stroke History: 0 Vascular Disease History: 0 Age Score: 0 Gender Score: 0       CrCl 116 ml/min Platelet count 209  Patient has not had an Afib/aflutter ablation in the last 3 months, DCCV within the last 4 weeks or a watchman implanted in the last 45 days   Per office protocol, patient can hold Eliquis  for 2 days prior to procedure.    **This guidance is not considered finalized until pre-operative APP has relayed final recommendations.**

## 2024-07-27 NOTE — Telephone Encounter (Signed)
 Lmtcb.  (Re: ok to hold Eliquis  2 days prior to his procedure on 1/16)

## 2024-07-27 NOTE — Telephone Encounter (Signed)
"  ° °  Patient Name: [name]  DOB: 1965-08-31 MRN: [id number]  Primary Cardiologist: Aleene Passe, MD (Inactive)  Clinical pharmacists have reviewed the patient's past medical history, labs, and current medications as part of preoperative protocol coverage. The following recommendations have been made:  Per office protocol, patient can hold Eliquis  for 2 days prior to procedure.  I will route this recommendation to the requesting party via Epic fax function and remove from pre-op  pool.  Please call with questions.  Achille Xiang E Benjamine Strout, NP 07/27/2024, 8:25 AM  "

## 2024-08-03 NOTE — Telephone Encounter (Signed)
 I spoke to [facility] and I advised him that his cardiologist said that he can holdhis Eliquis  2 days prior to his procedure which would be the 14th and after his procedure on the 16th Dr. Suzann will let him know when to restart it.

## 2024-08-13 ENCOUNTER — Encounter: Payer: Self-pay | Admitting: Pediatrics

## 2024-08-19 NOTE — Progress Notes (Signed)
 [facility] History and Physical   Primary Care Physician:  [name], MD   Reason for Procedure:  Surveillance of adenomatous colon polyps  Plan:    Colonoscopy   The patient was provided an opportunity to ask questions and all were answered. The patient agreed with the plan.   HPI: [name] is a 59 y.o. male undergoing colonoscopy for surveillance of adenomatous colon polyps.  Patient underwent colonoscopy in 2022 which disclosed 5 nonadvanced tubular adenomas.  No family history of colon cancer or polyps.  Patient denies current symptoms of rectal bleeding or change in bowel habits.   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   Esophageal reflux 10/02/2006   Qualifier: Diagnosis of   By: Geralene Service       Qualifier: Diagnosis of  By: Geralene Service    Qualifier: Diagnosis of  By: Geralene Service   Last Assessment & Plan:  Stable, refill omeprazole      GERD (gastroesophageal reflux disease)    Gout    no flares in awhile (01/22/2017)   Gout 05/13/2007   History of MRI of cervical spine 09/25/2010   normal; left shoulder normal as well   Hyperlipidemia 10/02/2006   03/2014 - ASCVD risk score of 5.1%        Hypertension    HYPERTENSION, BENIGN SYSTEMIC 10/02/2006   INSOMNIA 09/19/2008   Qualifier: Diagnosis of   By: Gerlene NP, [place]         Left groin pain 01/02/2018   Left sided numbness    Migraine    ~ q 6 months (01/22/2017)   Migraine 10/02/2006    Qualifier: Diagnosis of   By: Geralene Service         Morbid obesity (HCC)    I've lost 43# in 15 weeks w/weight watchers (01/22/2017)   Morbid obesity (HCC) 10/02/2006   Qualifier: Diagnosis of   By: Geralene Service         Muscle fasciculation    OSA (obstructive sleep apnea) 05/17/2011   OSA on CPAP    Paroxysmal atrial fibrillation (HCC) 02/2014   chads2vasc score is 1   Plantar fasciitis of right foot 05/07/2016   Primary osteoarthritis of right knee 07/23/2016   Shortness of breath 03/21/2015   Sinusitis 10/23/2012   Symptomatic sinus bradycardia    Urinary incontinence 01/02/2018    Past Surgical History:  Procedure Laterality Date   ATRIAL FIBRILLATION ABLATION N/A 01/30/2023   Procedure: ATRIAL FIBRILLATION ABLATION;  Surgeon: Inocencio Soyla Lunger, MD;  Location: [facility] INVASIVE CV LAB;  Service: Cardiovascular;  Laterality: N/A;   CARDIAC CATHETERIZATION  05/2005   no blockage   KNEE ARTHROSCOPY Right 02/2013   LEFT HEART CATH AND CORONARY ANGIOGRAPHY N/A 01/27/2017   Procedure: Left Heart Cath and Coronary Angiography;  Surgeon: Burnard Debby LABOR, MD;  Location: [facility] INVASIVE CV LAB;  Service: Cardiovascular;  Laterality: N/A;    Prior to Admission medications  Medication Sig Start Date End  Date Taking? Authorizing Provider  apixaban  (ELIQUIS ) 5 MG TABS tablet Take 1 tablet by mouth twice daily 12/08/23   Fenton, Clint R, PA  atorvastatin  (LIPITOR) 20 MG tablet TAKE 1 TABLET BY MOUTH EVERY OTHER DAY 05/25/24   Camnitz, Soyla Lunger, MD  baclofen  (LIORESAL ) 10 MG tablet TAKE 1/2 (ONE-HALF) TABLET BY MOUTH ONCE DAILY AS NEEDED FOR MUSCLE SPASM Patient taking differently: Take 10 mg by mouth as needed. 07/17/22   [name], MD  clotrimazole  (LOTRIMIN ) 1 % cream Apply 1 Application topically 2 (two) times daily. 09/29/23   [name], MD  diclofenac  Sodium (VOLTAREN ) 1 % GEL Apply 4 g topically 4 (four) times daily. Apply to right knee 12/28/23   Iola Lukes, FNP  dofetilide  (TIKOSYN ) 500 MCG capsule TAKE 1 CAPSULE BY MOUTH 2 TIMES DAILY. 02/04/24   Fenton, Clint R, PA  fluticasone  (FLONASE ) 50 MCG/ACT nasal spray Place 2 sprays into both nostrils daily. Patient taking differently: Place 2 sprays into both nostrils as needed. 09/14/18   [name], MD  ibuprofen  (ADVIL ) 200 MG tablet Take by mouth. 05/09/17   [provider]  loratadine  (CLARITIN ) 10 MG tablet Take 10 mg by mouth as needed. 04/30/17   [provider]  losartan  (COZAAR ) 50 MG tablet Take 1 tablet by mouth once daily 05/21/24   McIntyre, Brittany J, MD  MITIGARE  0.6 MG CAPS Take 1 capsule (0.6 mg total) by mouth daily. 09/29/23   [name], MD  montelukast  (SINGULAIR ) 10 MG tablet Take 10 mg by mouth as needed. 04/30/17   [provider]  Multiple Vitamin (MULTIVITAMIN WITH MINERALS) TABS tablet Take 1 tablet by mouth at bedtime.    [provider]  omega-3 acid ethyl esters (LOVAZA) 1 g capsule Take by mouth. 05/09/17   [provider]  Omega-3 Fatty Acids (FISH OIL) 1000 MG CAPS Take 1,000 mg by mouth in the morning.    [provider]  omeprazole  (PRILOSEC) 40 MG capsule TAKE 1 CAPSULE BY MOUTH ONCE DAILY BEFORE BREAKFAST 06/21/24   Miana Politte, Inocente HERO, MD  oxyCODONE  (ROXICODONE ) 5 MG immediate release tablet Take 1 tablet (5 mg total) by mouth every 6 (six) hours as needed for severe pain (pain score 7-10) or breakthrough pain. Patient taking differently: Take 5 mg by mouth as needed for severe pain (pain score 7-10) or breakthrough pain. 12/28/23   Iola Lukes, FNP  potassium chloride  (KLOR-CON  M) 10 MEQ tablet TAKE 1 TABLET BY MOUTH AT BEDTIME 04/01/24   Fenton, Clint R, PA  sodium chloride  (OCEAN) 0.65 % SOLN nasal spray Place 2 sprays into both nostrils as needed for congestion. 03/25/23   Howell Lunger, DO  TURMERIC PO Take 1 capsule by mouth every other day. In the morning.    [provider]   Turmeric, Curcuma Longa, (TURMERIC ROOT) POWD Take 1 tablet by mouth. 05/09/17   [provider]  XHANCE  93 MCG/ACT EXHU Place 1 spray into both nostrils 2 (two) times daily.    [provider]    Current Outpatient Medications  Medication Sig Dispense Refill   baclofen  (LIORESAL ) 10 MG tablet TAKE 1/2 (ONE-HALF) TABLET BY MOUTH ONCE DAILY AS NEEDED FOR MUSCLE SPASM (Patient taking differently: Take 10 mg by mouth as needed.) 30 tablet 0   dofetilide  (TIKOSYN ) 500 MCG capsule TAKE 1 CAPSULE BY MOUTH 2 TIMES DAILY. 180 capsule 2   losartan  (COZAAR ) 50 MG tablet Take 1 tablet by mouth once daily 90  tablet 3   MITIGARE  0.6 MG CAPS Take 1 capsule (0.6 mg total) by mouth daily. 90 capsule 3   Multiple Vitamin (MULTIVITAMIN WITH MINERALS) TABS tablet Take 1 tablet by mouth at bedtime.     omega-3 acid ethyl esters (LOVAZA) 1 g capsule Take by mouth.     Omega-3 Fatty Acids (FISH OIL) 1000 MG CAPS Take 1,000 mg by mouth in the morning.     omeprazole  (PRILOSEC) 40 MG capsule TAKE 1 CAPSULE BY MOUTH ONCE DAILY BEFORE BREAKFAST 90 capsule 0   potassium chloride  (KLOR-CON  M) 10 MEQ tablet TAKE 1 TABLET BY MOUTH AT BEDTIME 90 tablet 3   apixaban  (ELIQUIS ) 5 MG TABS tablet Take 1 tablet by mouth twice daily 180 tablet 3   atorvastatin  (LIPITOR) 20 MG tablet TAKE 1 TABLET BY MOUTH EVERY OTHER DAY 45 tablet 1   clotrimazole  (LOTRIMIN ) 1 % cream Apply 1 Application topically 2 (two) times daily. (Patient not taking: Reported on 08/20/2024) 30 g 0   diclofenac  Sodium (VOLTAREN ) 1 % GEL Apply 4 g topically 4 (four) times daily. Apply to right knee 150 g 0   fluticasone  (FLONASE ) 50 MCG/ACT nasal spray Place 2 sprays into both nostrils daily. (Patient taking differently: Place 2 sprays into both nostrils as needed.) 16 g 6   ibuprofen  (ADVIL ) 200 MG tablet Take by mouth. (Patient not taking: Reported on 08/20/2024)     loratadine  (CLARITIN ) 10 MG tablet Take 10 mg by mouth as needed.      montelukast  (SINGULAIR ) 10 MG tablet Take 10 mg by mouth as needed.     oxyCODONE  (ROXICODONE ) 5 MG immediate release tablet Take 1 tablet (5 mg total) by mouth every 6 (six) hours as needed for severe pain (pain score 7-10) or breakthrough pain. (Patient not taking: Reported on 08/20/2024) 10 tablet 0   sodium chloride  (OCEAN) 0.65 % SOLN nasal spray Place 2 sprays into both nostrils as needed for congestion. 50 mL 0   TURMERIC PO Take 1 capsule by mouth every other day. In the morning.     Turmeric, Curcuma Longa, (TURMERIC ROOT) POWD Take 1 tablet by mouth. (Patient not taking: Reported on 08/20/2024)     XHANCE  93 MCG/ACT EXHU Place 1 spray into both nostrils 2 (two) times daily.     Current Facility-Administered Medications  Medication Dose Route Frequency Provider Last Rate Last Admin   0.9 %  sodium chloride  infusion  500 mL Intravenous Continuous Durward Matranga, Inocente HERO, MD        Allergies as of 08/20/2024 - Review Complete 08/20/2024  Allergen Reaction Noted   Hydrochlorothiazide-propranolol  [propranolol -hctz] Hives and Other (See Comments) 03/03/2015   Penicillins Hives and Other (See Comments) 04/04/2010    Family History  Problem Relation Age of Onset   Atrial fibrillation Mother 86       had a stroke   Hypertension Father    Diabetes Father    Colon cancer Neg Hx    Esophageal cancer Neg Hx    Stomach cancer Neg Hx     Social History   Socioeconomic History   Marital status: Married    Spouse name: Not on file   Number of children: 3   Years of education: Not on file   Highest education level: 12th grade  Occupational History   Not on file  Tobacco Use   Smoking status: Former    Current packs/day: 0.00    Average packs/day: 1 pack/day for 10.0 years (10.0 ttl pk-yrs)  Types: Cigarettes    Start date: 04/02/1984    Quit date: 04/02/1994    Years since quitting: 30.4   Smokeless tobacco: Former    Types: Chew   Tobacco comments:    Former smoker and chew 07/15/22   Vaping Use   Vaping status: Never Used  Substance and Sexual Activity   Alcohol use: No    Alcohol/week: 0.0 standard drinks of alcohol    Comment: stopped in 1994   Drug use: Not Currently    Types: Marijuana    Comment: stopped marijuna in 1994   Sexual activity: Not Currently  Other Topics Concern   Not on file  Social History Narrative   Lives in [place] with spouse.  3 children, all grown.   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   Social Drivers of Health   Tobacco Use: Medium Risk (08/20/2024)   Patient History    Smoking Tobacco Use: Former    Smokeless Tobacco Use: Former    Passive Exposure: Not on Actuary Strain: Low Risk (03/25/2024)   Overall Financial Resource Strain (CARDIA)    Difficulty of Paying Living Expenses: Not hard at all  Food Insecurity: No Food Insecurity (03/25/2024)   Epic    Worried About Programme Researcher, Broadcasting/film/video in the Last Year: Never true    Ran Out of Food in the Last Year: Never true  Transportation Needs: No Transportation Needs (03/25/2024)   Epic    Lack of Transportation (Medical): No    Lack of Transportation (Non-Medical): No  Physical Activity: Insufficiently Active (03/25/2024)   Exercise Vital Sign    Days of Exercise per Week: 3 days    Minutes of Exercise per Session: 20 min  Stress: No Stress Concern Present (03/25/2024)   [employer] of Occupational Health - Occupational Stress Questionnaire    Feeling of Stress: Only a little  Social Connections: Socially Integrated (03/25/2024)   Social Connection and Isolation Panel    Frequency of Communication with Friends and Family: More than three times a week    Frequency of Social Gatherings with Friends and Family: More than three times a week    Attends Religious Services: More than 4 times per year    Active Member of [employer] or Organizations: Yes    Attends Banker Meetings: 1 to 4 times per year    Marital Status: Married   Catering Manager Violence: Not on file  Depression (PHQ2-9): Low Risk (09/29/2023)   Depression (PHQ2-9)    PHQ-2 Score: 0  Alcohol Screen: Not on file  Housing: Low Risk (03/25/2024)   Epic    Unable to Pay for Housing in the Last Year: No    Number of Times Moved in the Last Year: 0    Homeless in the Last Year: No  Utilities: Not on file  Health Literacy: Not on file    Review of Systems:  All other review of systems negative except as mentioned in the HPI.  Physical Exam: Vital signs BP 133/82   Pulse 69   Temp 98.9 F (37.2 C) (Temporal)   Ht 6' 2 (1.88 m)   Wt (!) 322 lb (146.1 kg)   SpO2 99%   BMI 41.34 kg/m   General:   Alert,  Well-developed, well-nourished, pleasant and cooperative in NAD Airway:  Mallampati 2 Lungs:  Clear throughout to auscultation.   Heart:  Regular rate and rhythm; no murmurs, clicks, rubs,  or  gallops. Abdomen:  Soft, nontender and nondistended. Normal bowel sounds.   Neuro/Psych:  Normal mood and affect. A and O x 3  Inocente Hausen, MD [facility]

## 2024-08-20 ENCOUNTER — Encounter: Payer: Self-pay | Admitting: Pediatrics

## 2024-08-20 ENCOUNTER — Encounter: Admitting: Pediatrics

## 2024-08-20 ENCOUNTER — Ambulatory Visit: Admitting: Pediatrics

## 2024-08-20 VITALS — BP 139/88 | HR 61 | Temp 98.9°F | Resp 14 | Ht 74.0 in | Wt 322.0 lb

## 2024-08-20 DIAGNOSIS — K648 Other hemorrhoids: Secondary | ICD-10-CM | POA: Diagnosis not present

## 2024-08-20 DIAGNOSIS — Z1211 Encounter for screening for malignant neoplasm of colon: Secondary | ICD-10-CM | POA: Diagnosis present

## 2024-08-20 DIAGNOSIS — D124 Benign neoplasm of descending colon: Secondary | ICD-10-CM | POA: Diagnosis not present

## 2024-08-20 DIAGNOSIS — K635 Polyp of colon: Secondary | ICD-10-CM | POA: Diagnosis not present

## 2024-08-20 DIAGNOSIS — Z860100 Personal history of colon polyps, unspecified: Secondary | ICD-10-CM

## 2024-08-20 DIAGNOSIS — K5730 Diverticulosis of large intestine without perforation or abscess without bleeding: Secondary | ICD-10-CM | POA: Diagnosis not present

## 2024-08-20 DIAGNOSIS — Z860101 Personal history of adenomatous and serrated colon polyps: Secondary | ICD-10-CM

## 2024-08-20 DIAGNOSIS — D122 Benign neoplasm of ascending colon: Secondary | ICD-10-CM

## 2024-08-20 MED ORDER — SODIUM CHLORIDE 0.9 % IV SOLN: 500.0000 mL | INTRAVENOUS | Status: DC

## 2024-08-20 NOTE — Progress Notes (Signed)
 Pt sedate, gd SR's, VSS, report to RN

## 2024-08-20 NOTE — Progress Notes (Signed)
 Called to room to assist during endoscopic procedure.  Patient ID and intended procedure confirmed with present staff. Received instructions for my participation in the procedure from the performing physician.

## 2024-08-20 NOTE — Op Note (Signed)
 [facility] Patient Name: [name] Procedure Date: 08/20/2024 10:14 AM MRN: [id number] Endoscopist: Inocente Hausen , MD, [id number] Age: 59 Referring MD:  Date of Birth: 1965/12/11 Gender: Male Account #: [id number] Procedure:                Colonoscopy Indications:              High risk colon cancer surveillance: Personal                            history of multiple (3 or more) adenomas, Last                            colonoscopy: August 2022 Medicines:                Monitored Anesthesia Care Procedure:                Pre-Anesthesia Assessment:                           - Prior to the procedure, a History and Physical                            was performed, and patient medications and                            allergies were reviewed. The patient's tolerance of                            previous anesthesia was also reviewed. The risks                            and benefits of the procedure and the sedation                            options and risks were discussed with the patient.                            All questions were answered, and informed consent                            was obtained. Prior Anticoagulants: The patient has                            taken Eliquis  (apixaban ), last dose was 3 days                            prior to procedure. ASA Grade Assessment: III - A                            patient with severe systemic disease. After                            reviewing the risks and benefits, the patient was  deemed in satisfactory condition to undergo the                            procedure.                           After obtaining informed consent, the colonoscope                            was passed under direct vision. Throughout the                            procedure, the patient's blood pressure, pulse, and                            oxygen saturations were monitored continuously. The                             Olympus Scope [id number] was introduced through the                            anus and advanced to the cecum, identified by                            appendiceal orifice and ileocecal valve. The                            colonoscopy was performed without difficulty. The                            patient tolerated the procedure well. The quality                            of the bowel preparation was good. The ileocecal                            valve, appendiceal orifice, and rectum were                            photographed. Scope In: 10:26:41 AM Scope Out: 10:44:35 AM Scope Withdrawal Time: 0 hours 15 minutes 44 seconds  Total Procedure Duration: 0 hours 17 minutes 54 seconds  Findings:                 The perianal and digital rectal examinations were                            normal. Pertinent negatives include normal                            sphincter tone and no palpable rectal lesions.                           Small-mouthed diverticula were found in the sigmoid  colon and descending colon.                           Two sessile polyps were found in the descending                            colon and ascending colon. The polyps were 5 to 6                            mm in size. These polyps were removed with a cold                            snare. Resection and retrieval were complete.                           A 4 mm polyp was found in the ascending colon. The                            polyp was sessile. The polyp was removed with a                            cold biopsy forceps. Resection and retrieval were                            complete.                           Internal hemorrhoids were found during retroflexion. Complications:            No immediate complications. Estimated blood loss:                            Minimal. Estimated Blood Loss:     Estimated blood loss was minimal. Impression:               - Diverticulosis in the  sigmoid colon and in the                            descending colon.                           - Two 5 to 6 mm polyps in the descending colon and                            in the ascending colon, removed with a cold snare.                            Resected and retrieved.                           - One 4 mm polyp in the ascending colon, removed                            with a cold biopsy forceps. Resected and retrieved.                           -  Internal hemorrhoids. Recommendation:           - Discharge patient to home (ambulatory).                           - Await pathology results.                           - Repeat colonoscopy for surveillance based on                            pathology results.                           - Resume Eliquis  08/21/2024.                           - The findings and recommendations were discussed                            with the patient's family.                           - Patient has a contact number available for                            emergencies. The signs and symptoms of potential                            delayed complications were discussed with the                            patient. Return to normal activities tomorrow.                            Written discharge instructions were provided to the                            patient. Inocente Hausen, MD 08/20/2024 10:51:22 AM This report has been signed electronically.

## 2024-08-20 NOTE — Addendum Note (Signed)
 Addended by: DALLIE CAMIE HERO on: 08/20/2024 11:40 AM   Modules accepted: Orders

## 2024-08-20 NOTE — Patient Instructions (Signed)
 Handouts given: Polyps, Hemorrhoids, Diverticulosis Resume previous diet. Resume Eliquis  08/21/24.  Await pathology results. Repeat colonoscopy for surveillance based on pathology results.  YOU HAD AN ENDOSCOPIC PROCEDURE TODAY AT THE [facility]:   Refer to the procedure report that was given to you for any specific questions about what was found during the examination.  If the procedure report does not answer your questions, please call your gastroenterologist to clarify.  If you requested that your care partner not be given the details of your procedure findings, then the procedure report has been included in a sealed envelope for you to review at your convenience later.  YOU SHOULD EXPECT: Some feelings of bloating in the abdomen. Passage of more gas than usual.  Walking can help get rid of the air that was put into your GI tract during the procedure and reduce the bloating. If you had a lower endoscopy (such as a colonoscopy or flexible sigmoidoscopy) you may notice spotting of blood in your stool or on the toilet paper. If you underwent a bowel prep for your procedure, you may not have a normal bowel movement for a few days.  Please Note:  You might notice some irritation and congestion in your nose or some drainage.  This is from the oxygen used during your procedure.  There is no need for concern and it should clear up in a day or so.  SYMPTOMS TO REPORT IMMEDIATELY:  Following lower endoscopy (colonoscopy or flexible sigmoidoscopy):  Excessive amounts of blood in the stool  Significant tenderness or worsening of abdominal pains  Swelling of the abdomen that is new, acute  Fever of 100F or higher  For urgent or emergent issues, a gastroenterologist can be reached at any hour by calling [phone]. Do not use MyChart messaging for urgent concerns.    DIET:  We do recommend a small meal at first, but then you may proceed to your regular diet.  Drink plenty of fluids  but you should avoid alcoholic beverages for 24 hours.  ACTIVITY:  You should plan to take it easy for the rest of today and you should NOT DRIVE or use heavy machinery until tomorrow (because of the sedation medicines used during the test).    FOLLOW UP: Our staff will call the number listed on your records the next business day following your procedure.  We will call around 7:15- 8:00 am to check on you and address any questions or concerns that you may have regarding the information given to you following your procedure. If we do not reach you, we will leave a message.     If any biopsies were taken you will be contacted by phone or by letter within the next 1-3 weeks.  Please call us  at [phone] if you have not heard about the biopsies in 3 weeks.    SIGNATURES/CONFIDENTIALITY: You and/or your care partner have signed paperwork which will be entered into your electronic medical record.  These signatures attest to the fact that that the information above on your After Visit Summary has been reviewed and is understood.  Full responsibility of the confidentiality of this discharge information lies with you and/or your care-partner.

## 2024-08-20 NOTE — Progress Notes (Signed)
 Pt's states no medical or surgical changes since previsit or office visit.

## 2024-08-23 ENCOUNTER — Telehealth: Payer: Self-pay

## 2024-08-23 NOTE — Telephone Encounter (Signed)
" °  Follow up Call-     08/20/2024    9:04 AM  Call back number  Post procedure Call Back phone  # [phone]  Permission to leave phone message Yes     Patient questions:  Do you have a fever, pain , or abdominal swelling? No. Pain Score  0 *  Have you tolerated food without any problems? Yes.    Have you been able to return to your normal activities? Yes.    Do you have any questions about your discharge instructions: Diet   No. Medications  No. Follow up visit  No.  Do you have questions or concerns about your Care? No.  Actions: * If pain score is 4 or above: No action needed, pain <4.   "

## 2024-08-24 ENCOUNTER — Other Ambulatory Visit

## 2024-08-24 ENCOUNTER — Ambulatory Visit: Admitting: Orthopaedic Surgery

## 2024-08-24 DIAGNOSIS — Z6841 Body Mass Index (BMI) 40.0 and over, adult: Secondary | ICD-10-CM

## 2024-08-24 DIAGNOSIS — M25552 Pain in left hip: Secondary | ICD-10-CM | POA: Diagnosis not present

## 2024-08-24 LAB — SURGICAL PATHOLOGY

## 2024-08-24 NOTE — Progress Notes (Signed)
 "  Office Visit Note   Patient: [name]           Date of Birth: [birth year]           MRN: [id number] Visit Date: 08/24/2024              Requested by: [name], MD [address] [place],  [place] [address] PCP: [name], MD   Assessment & Plan: Visit Diagnoses:  1. Pain in left hip   2. 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  Not quite bone-on-bone yet.  Recommend weight reduction, physical therapy for strengthening and patient would like to see Dr. Burnetta for a steroid injection.  He will make an appointment for this.  He is currently on weight watchers.  Will see him back as needed.  The patient meets the AMA guidelines for Morbid (severe) obesity with a BMI > 40.0 and I have recommended weight loss.  Follow-Up Instructions: Return if symptoms worsen or fail to improve.   Orders:  Orders Placed This Encounter  Procedures   XR HIP UNILAT W OR W/O PELVIS 2-3 VIEWS LEFT   Ambulatory referral to Physical Therapy   No orders of the defined types were placed in this encounter.     Procedures: No procedures performed   Clinical Data: No additional findings.   Subjective: Chief Complaint  Patient presents with   Left Hip - Pain    HPI [name] is a 59 year old gentleman here for evaluation of a left hip and buttock pain sometimes groin.  Was originally at the neurosurgeons office for workup of back pain but was later felt that it was coming from his hip.  He had an MRI left hip which showed moderate osteoarthritis and degenerative labral tear.  He states he has buttock pain with ambulation and weightbearing and ADLs. Review of Systems  Constitutional: Negative.   HENT: Negative.    Eyes: Negative.   Respiratory: Negative.    Cardiovascular: Negative.   Gastrointestinal: Negative.   Endocrine: Negative.   Genitourinary: Negative.   Skin: Negative.    Allergic/Immunologic: Negative.   Neurological: Negative.   Hematological: Negative.   Psychiatric/Behavioral: Negative.    All other systems reviewed and are negative.    Objective: Vital Signs: There were no vitals taken for this visit.  Physical Exam Vitals and nursing note reviewed.  Constitutional:      Appearance: He is well-developed.  HENT:     Head: Normocephalic and atraumatic.  Eyes:     Pupils: Pupils are equal, round, and reactive to light.  Pulmonary:     Effort: Pulmonary effort is normal.  Abdominal:     Palpations: Abdomen is soft.  Musculoskeletal:        General: Normal range of motion.     Cervical back: Neck supple.  Skin:    General: Skin is warm.  Neurological:     Mental Status: He is alert and oriented to person, place, and time.  Psychiatric:        Behavior: Behavior normal.        Thought Content: Thought content normal.        Judgment: Judgment normal.     Ortho Exam Exam of the left hip shows pain with internal rotation and Stinchfield test that radiates to the buttock.  No trochanteric tenderness.  No sciatic tension signs. Specialty Comments:  No specialty comments  available.  Imaging: No results found.   PMFS History: Patient Active Problem List   Diagnosis Date Noted   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   Lumbar radiculopathy 12/28/2023   Esophageal reflux 12/28/2023   Encounter for screening for other metabolic disorders 12/28/2023   Encounter for monitoring dofetilide  therapy 11/05/2023   Nasal congestion 03/25/2023   Blepharitis of lower eyelids of both eyes 02/19/2023   Kidney cysts 05/21/2022   Left upper quadrant abdominal pain 05/21/2022   Acute left ankle pain 10/25/2021   Chronic left-sided low back pain without sciatica 09/05/2020   Abnormal stress test 11/27/2017   Muscle fasciculation 11/27/2017   Routine adult health maintenance 09/12/2017   Unilateral primary osteoarthritis, right knee  07/23/2016   Plantar fasciitis of left foot 05/07/2016   Palpitations 01/12/2016   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/05/2016   Disequilibrium 03/21/2015   OSA on CPAP    PAF (paroxysmal atrial fibrillation) (HCC)    Chest pain 03/03/2015   Pain in left knee 10/13/2014   Pain in right knee 11/10/2012   Lateral epicondylitis of elbow 04/13/2012   Somnolence 11/25/2011   Pain in left hip 06/17/2011   Obstructive sleep apnea 05/17/2011   Dorsalgia 10/29/2010   Insomnia 09/19/2008   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   Esophageal reflux 10/02/2006   Qualifier: Diagnosis of   By: Geralene Service       Qualifier: Diagnosis of  By: Geralene Service    Qualifier: Diagnosis of  By: Geralene Service   Last Assessment & Plan:  Stable, refill omeprazole      GERD (gastroesophageal reflux disease)    Gout    no flares in awhile (01/22/2017)   Gout 05/13/2007   History of MRI of cervical spine 09/25/2010   normal; left shoulder normal as well   Hyperlipidemia 10/02/2006   03/2014 - ASCVD risk score of 5.1%        Hypertension    HYPERTENSION, BENIGN SYSTEMIC 10/02/2006   INSOMNIA 09/19/2008   Qualifier: Diagnosis of   By: Gerlene NP, Devere         Left groin pain 01/02/2018   Left sided numbness    Migraine    ~ q 6 months (01/22/2017)   Migraine 10/02/2006   Qualifier: Diagnosis of   By: Geralene Service         Morbid obesity (HCC)    I've lost 43# in  15 weeks w/weight watchers (01/22/2017)   Morbid obesity (HCC) 10/02/2006   Qualifier: Diagnosis of   By: Geralene Service         Muscle fasciculation    OSA (obstructive sleep apnea) 05/17/2011   OSA on CPAP    Paroxysmal atrial fibrillation (HCC) 02/2014   chads2vasc score is 1   Plantar fasciitis of right foot 05/07/2016   Primary osteoarthritis of right knee 07/23/2016   Shortness of breath 03/21/2015   Sinusitis 10/23/2012   Symptomatic sinus  bradycardia    Urinary incontinence 01/02/2018    Family History  Problem Relation Age of Onset   Atrial fibrillation Mother 25       had a stroke   Hypertension Father    Diabetes Father    Colon cancer Neg Hx    Esophageal cancer Neg Hx    Stomach cancer Neg Hx     Past Surgical History:  Procedure Laterality Date   ATRIAL FIBRILLATION ABLATION N/A 01/30/2023   Procedure: ATRIAL FIBRILLATION ABLATION;  Surgeon: Inocencio Soyla Lunger, MD;  Location: [facility] IN[facility]ASIVE [facility];  Service: Cardiovascular;  Laterality: N/A;   CARDIAC CATHETERIZATION  05/2005   no blockage   KNEE ARTHROSCOPY Right 02/2013   LEFT HEART CATH AND CORONARY ANGIOGRAPHY N/A 01/27/2017   Procedure: Left Heart Cath and Coronary Angiography;  Surgeon: Burnard Debby LABOR, MD;  Location: [facility] INVASIVE CV LAB;  Service: Cardiovascular;  Laterality: N/A;   Social History   Occupational History   Not on file  Tobacco Use   Smoking status: Former    Current packs/day: 0.00    Average packs/day: 1 pack/day for 10.0 years (10.0 ttl pk-yrs)    Types: Cigarettes    Start date: 04/02/1984    Quit date: 04/02/1994    Years since quitting: 30.4   Smokeless tobacco: Former    Types: Chew   Tobacco comments:    Former smoker and chew 07/15/22  Vaping Use   Vaping status: Never Used  Substance and Sexual Activity   Alcohol use: No    Alcohol/week: 0.0 standard drinks of alcohol    Comment: stopped in 1994   Drug use: Not Currently    Types: Marijuana    Comment: stopped  marijuna in 1994   Sexual activity: Not Currently        "

## 2024-08-26 ENCOUNTER — Ambulatory Visit: Payer: Self-pay | Admitting: Pediatrics

## 2024-09-06 ENCOUNTER — Ambulatory Visit: Admitting: Sports Medicine

## 2024-09-08 ENCOUNTER — Ambulatory Visit

## 2024-09-13 ENCOUNTER — Ambulatory Visit: Admitting: Sports Medicine

## 2024-09-15 ENCOUNTER — Ambulatory Visit: Admitting: Physical Therapy

## 2024-11-04 ENCOUNTER — Ambulatory Visit (HOSPITAL_COMMUNITY): Admitting: Physician Assistant
